# Patient Record
Sex: Female | Born: 1980 | Race: Black or African American | Hispanic: No | Smoking: Never smoker
Health system: Southern US, Community
[De-identification: ages and names within clinical notes are randomized; demographics above are authoritative.]

## PROBLEM LIST (undated history)

## (undated) ENCOUNTER — Inpatient Hospital Stay (HOSPITAL_COMMUNITY): Payer: Self-pay

## (undated) DIAGNOSIS — Z1379 Encounter for other screening for genetic and chromosomal anomalies: Principal | ICD-10-CM

## (undated) DIAGNOSIS — M5136 Other intervertebral disc degeneration, lumbar region: Secondary | ICD-10-CM

## (undated) DIAGNOSIS — Z9289 Personal history of other medical treatment: Secondary | ICD-10-CM

## (undated) DIAGNOSIS — C50412 Malignant neoplasm of upper-outer quadrant of left female breast: Principal | ICD-10-CM

## (undated) DIAGNOSIS — M51369 Other intervertebral disc degeneration, lumbar region without mention of lumbar back pain or lower extremity pain: Secondary | ICD-10-CM

## (undated) HISTORY — DX: Encounter for other screening for genetic and chromosomal anomalies: Z13.79

## (undated) HISTORY — PX: INDUCED ABORTION: SHX677

## (undated) HISTORY — DX: Malignant neoplasm of upper-outer quadrant of left female breast: C50.412

## (undated) HISTORY — PX: UMBILICAL HERNIA REPAIR: SHX196

## (undated) HISTORY — PX: DILATION AND CURETTAGE OF UTERUS: SHX78

## (undated) HISTORY — PX: HERNIA REPAIR: SHX51

---
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
## 1999-02-04 ENCOUNTER — Ambulatory Visit (HOSPITAL_COMMUNITY): Admission: RE | Admit: 1999-02-04 | Discharge: 1999-02-04 | Payer: Self-pay | Admitting: *Deleted

## 1999-02-11 HISTORY — PX: LAPAROSCOPIC CHOLECYSTECTOMY: SUR755

## 1999-03-19 ENCOUNTER — Inpatient Hospital Stay (HOSPITAL_COMMUNITY): Admission: AD | Admit: 1999-03-19 | Discharge: 1999-03-19 | Payer: Self-pay | Admitting: Obstetrics & Gynecology

## 1999-03-20 ENCOUNTER — Inpatient Hospital Stay (HOSPITAL_COMMUNITY): Admission: AD | Admit: 1999-03-20 | Discharge: 1999-03-20 | Payer: Self-pay | Admitting: Obstetrics

## 1999-04-06 ENCOUNTER — Inpatient Hospital Stay (HOSPITAL_COMMUNITY): Admission: AD | Admit: 1999-04-06 | Discharge: 1999-04-06 | Payer: Self-pay | Admitting: Obstetrics & Gynecology

## 1999-04-17 ENCOUNTER — Observation Stay (HOSPITAL_COMMUNITY): Admission: AD | Admit: 1999-04-17 | Discharge: 1999-04-17 | Payer: Self-pay | Admitting: Obstetrics

## 1999-04-19 ENCOUNTER — Inpatient Hospital Stay (HOSPITAL_COMMUNITY): Admission: AD | Admit: 1999-04-19 | Discharge: 1999-04-21 | Payer: Self-pay | Admitting: Obstetrics

## 1999-04-27 ENCOUNTER — Inpatient Hospital Stay (HOSPITAL_COMMUNITY): Admission: AD | Admit: 1999-04-27 | Discharge: 1999-04-27 | Payer: Self-pay | Admitting: Obstetrics & Gynecology

## 1999-04-30 ENCOUNTER — Inpatient Hospital Stay (HOSPITAL_COMMUNITY): Admission: AD | Admit: 1999-04-30 | Discharge: 1999-05-02 | Payer: Self-pay | Admitting: *Deleted

## 1999-05-31 ENCOUNTER — Encounter: Payer: Self-pay | Admitting: Obstetrics & Gynecology

## 1999-05-31 ENCOUNTER — Inpatient Hospital Stay (HOSPITAL_COMMUNITY): Admission: AD | Admit: 1999-05-31 | Discharge: 1999-05-31 | Payer: Self-pay | Admitting: Obstetrics & Gynecology

## 1999-06-01 ENCOUNTER — Encounter (INDEPENDENT_AMBULATORY_CARE_PROVIDER_SITE_OTHER): Payer: Self-pay

## 1999-06-01 ENCOUNTER — Inpatient Hospital Stay (HOSPITAL_COMMUNITY): Admission: RE | Admit: 1999-06-01 | Discharge: 1999-06-02 | Payer: Self-pay | Admitting: General Surgery

## 1999-06-01 ENCOUNTER — Encounter: Payer: Self-pay | Admitting: General Surgery

## 1999-08-12 ENCOUNTER — Emergency Department (HOSPITAL_COMMUNITY): Admission: EM | Admit: 1999-08-12 | Discharge: 1999-08-12 | Payer: Self-pay | Admitting: Emergency Medicine

## 2000-04-17 ENCOUNTER — Inpatient Hospital Stay (HOSPITAL_COMMUNITY): Admission: AD | Admit: 2000-04-17 | Discharge: 2000-04-17 | Payer: Self-pay | Admitting: Obstetrics

## 2000-04-26 ENCOUNTER — Inpatient Hospital Stay (HOSPITAL_COMMUNITY): Admission: AD | Admit: 2000-04-26 | Discharge: 2000-04-26 | Payer: Self-pay | Admitting: *Deleted

## 2000-05-15 ENCOUNTER — Inpatient Hospital Stay (HOSPITAL_COMMUNITY): Admission: AD | Admit: 2000-05-15 | Discharge: 2000-05-15 | Payer: Self-pay | Admitting: *Deleted

## 2000-07-13 HISTORY — PX: WISDOM TOOTH EXTRACTION: SHX21

## 2001-01-23 ENCOUNTER — Emergency Department (HOSPITAL_COMMUNITY): Admission: EM | Admit: 2001-01-23 | Discharge: 2001-01-23 | Payer: Self-pay | Admitting: Emergency Medicine

## 2001-05-01 ENCOUNTER — Emergency Department (HOSPITAL_COMMUNITY): Admission: EM | Admit: 2001-05-01 | Discharge: 2001-05-01 | Payer: Self-pay | Admitting: Nurse Practitioner

## 2001-05-21 ENCOUNTER — Inpatient Hospital Stay (HOSPITAL_COMMUNITY): Admission: AD | Admit: 2001-05-21 | Discharge: 2001-05-21 | Payer: Self-pay | Admitting: *Deleted

## 2001-05-21 ENCOUNTER — Encounter: Payer: Self-pay | Admitting: *Deleted

## 2001-06-08 ENCOUNTER — Inpatient Hospital Stay (HOSPITAL_COMMUNITY): Admission: AD | Admit: 2001-06-08 | Discharge: 2001-06-08 | Payer: Self-pay | Admitting: Obstetrics

## 2001-09-15 ENCOUNTER — Encounter: Payer: Self-pay | Admitting: Emergency Medicine

## 2001-09-15 ENCOUNTER — Emergency Department (HOSPITAL_COMMUNITY): Admission: EM | Admit: 2001-09-15 | Discharge: 2001-09-15 | Payer: Self-pay | Admitting: Emergency Medicine

## 2001-11-15 ENCOUNTER — Inpatient Hospital Stay (HOSPITAL_COMMUNITY): Admission: AD | Admit: 2001-11-15 | Discharge: 2001-11-15 | Payer: Self-pay | Admitting: *Deleted

## 2001-11-16 ENCOUNTER — Inpatient Hospital Stay (HOSPITAL_COMMUNITY): Admission: AD | Admit: 2001-11-16 | Discharge: 2001-11-16 | Payer: Self-pay | Admitting: Obstetrics and Gynecology

## 2001-11-17 ENCOUNTER — Inpatient Hospital Stay (HOSPITAL_COMMUNITY): Admission: AD | Admit: 2001-11-17 | Discharge: 2001-11-17 | Payer: Self-pay | Admitting: *Deleted

## 2001-11-24 ENCOUNTER — Inpatient Hospital Stay (HOSPITAL_COMMUNITY): Admission: AD | Admit: 2001-11-24 | Discharge: 2001-11-24 | Payer: Self-pay | Admitting: *Deleted

## 2002-01-12 ENCOUNTER — Emergency Department (HOSPITAL_COMMUNITY): Admission: EM | Admit: 2002-01-12 | Discharge: 2002-01-13 | Payer: Self-pay | Admitting: Emergency Medicine

## 2002-05-17 ENCOUNTER — Inpatient Hospital Stay (HOSPITAL_COMMUNITY): Admission: AD | Admit: 2002-05-17 | Discharge: 2002-05-17 | Payer: Self-pay | Admitting: Family Medicine

## 2002-06-04 ENCOUNTER — Inpatient Hospital Stay (HOSPITAL_COMMUNITY): Admission: AD | Admit: 2002-06-04 | Discharge: 2002-06-04 | Payer: Self-pay | Admitting: *Deleted

## 2002-06-12 ENCOUNTER — Encounter: Payer: Self-pay | Admitting: Obstetrics and Gynecology

## 2002-06-12 ENCOUNTER — Inpatient Hospital Stay (HOSPITAL_COMMUNITY): Admission: RE | Admit: 2002-06-12 | Discharge: 2002-06-12 | Payer: Self-pay | Admitting: Obstetrics and Gynecology

## 2002-06-19 ENCOUNTER — Ambulatory Visit (HOSPITAL_COMMUNITY): Admission: RE | Admit: 2002-06-19 | Discharge: 2002-06-19 | Payer: Self-pay | Admitting: Obstetrics and Gynecology

## 2002-06-19 ENCOUNTER — Encounter (INDEPENDENT_AMBULATORY_CARE_PROVIDER_SITE_OTHER): Payer: Self-pay | Admitting: Specialist

## 2002-09-23 ENCOUNTER — Emergency Department (HOSPITAL_COMMUNITY): Admission: EM | Admit: 2002-09-23 | Discharge: 2002-09-23 | Payer: Self-pay | Admitting: Emergency Medicine

## 2003-02-01 ENCOUNTER — Inpatient Hospital Stay (HOSPITAL_COMMUNITY): Admission: AD | Admit: 2003-02-01 | Discharge: 2003-02-01 | Payer: Self-pay | Admitting: Obstetrics & Gynecology

## 2003-03-22 ENCOUNTER — Inpatient Hospital Stay (HOSPITAL_COMMUNITY): Admission: AD | Admit: 2003-03-22 | Discharge: 2003-03-22 | Payer: Self-pay | Admitting: Obstetrics & Gynecology

## 2003-11-13 ENCOUNTER — Inpatient Hospital Stay (HOSPITAL_COMMUNITY): Admission: AD | Admit: 2003-11-13 | Discharge: 2003-11-13 | Payer: Self-pay | Admitting: *Deleted

## 2004-01-15 ENCOUNTER — Inpatient Hospital Stay (HOSPITAL_COMMUNITY): Admission: AD | Admit: 2004-01-15 | Discharge: 2004-01-15 | Payer: Self-pay | Admitting: *Deleted

## 2004-01-25 ENCOUNTER — Inpatient Hospital Stay (HOSPITAL_COMMUNITY): Admission: AD | Admit: 2004-01-25 | Discharge: 2004-01-25 | Payer: Self-pay | Admitting: Obstetrics & Gynecology

## 2004-02-11 ENCOUNTER — Inpatient Hospital Stay (HOSPITAL_COMMUNITY): Admission: AD | Admit: 2004-02-11 | Discharge: 2004-02-11 | Payer: Self-pay | Admitting: Family Medicine

## 2004-02-21 ENCOUNTER — Inpatient Hospital Stay (HOSPITAL_COMMUNITY): Admission: AD | Admit: 2004-02-21 | Discharge: 2004-02-21 | Payer: Self-pay | Admitting: Obstetrics & Gynecology

## 2004-02-21 ENCOUNTER — Encounter (INDEPENDENT_AMBULATORY_CARE_PROVIDER_SITE_OTHER): Payer: Self-pay | Admitting: Specialist

## 2004-07-07 ENCOUNTER — Inpatient Hospital Stay (HOSPITAL_COMMUNITY): Admission: AD | Admit: 2004-07-07 | Discharge: 2004-07-07 | Payer: Self-pay | Admitting: Obstetrics & Gynecology

## 2004-07-10 ENCOUNTER — Ambulatory Visit (HOSPITAL_COMMUNITY): Admission: RE | Admit: 2004-07-10 | Discharge: 2004-07-10 | Payer: Self-pay | Admitting: Obstetrics & Gynecology

## 2004-08-19 ENCOUNTER — Ambulatory Visit (HOSPITAL_COMMUNITY): Admission: RE | Admit: 2004-08-19 | Discharge: 2004-08-19 | Payer: Self-pay | Admitting: Obstetrics & Gynecology

## 2004-12-26 ENCOUNTER — Inpatient Hospital Stay (HOSPITAL_COMMUNITY): Admission: AD | Admit: 2004-12-26 | Discharge: 2004-12-26 | Payer: Self-pay | Admitting: Obstetrics & Gynecology

## 2005-02-01 ENCOUNTER — Inpatient Hospital Stay (HOSPITAL_COMMUNITY): Admission: AD | Admit: 2005-02-01 | Discharge: 2005-02-01 | Payer: Self-pay | Admitting: Obstetrics & Gynecology

## 2005-02-07 ENCOUNTER — Inpatient Hospital Stay (HOSPITAL_COMMUNITY): Admission: AD | Admit: 2005-02-07 | Discharge: 2005-02-09 | Payer: Self-pay | Admitting: Obstetrics & Gynecology

## 2005-12-10 ENCOUNTER — Encounter: Admission: RE | Admit: 2005-12-10 | Discharge: 2006-01-12 | Payer: Self-pay | Admitting: Obstetrics

## 2005-12-22 ENCOUNTER — Ambulatory Visit (HOSPITAL_COMMUNITY): Admission: RE | Admit: 2005-12-22 | Discharge: 2005-12-22 | Payer: Self-pay | Admitting: Obstetrics

## 2005-12-23 ENCOUNTER — Encounter (INDEPENDENT_AMBULATORY_CARE_PROVIDER_SITE_OTHER): Payer: Self-pay | Admitting: *Deleted

## 2005-12-23 ENCOUNTER — Inpatient Hospital Stay (HOSPITAL_COMMUNITY): Admission: AD | Admit: 2005-12-23 | Discharge: 2005-12-25 | Payer: Self-pay | Admitting: Obstetrics

## 2006-05-12 ENCOUNTER — Encounter: Admission: RE | Admit: 2006-05-12 | Discharge: 2006-05-12 | Payer: Self-pay | Admitting: Family Medicine

## 2006-06-22 ENCOUNTER — Emergency Department (HOSPITAL_COMMUNITY): Admission: EM | Admit: 2006-06-22 | Discharge: 2006-06-22 | Payer: Self-pay | Admitting: Emergency Medicine

## 2006-09-01 ENCOUNTER — Encounter: Admission: RE | Admit: 2006-09-01 | Discharge: 2006-09-01 | Payer: Self-pay | Admitting: Neurology

## 2006-10-05 ENCOUNTER — Encounter: Admission: RE | Admit: 2006-10-05 | Discharge: 2006-11-01 | Payer: Self-pay | Admitting: Neurology

## 2006-11-12 ENCOUNTER — Inpatient Hospital Stay (HOSPITAL_COMMUNITY): Admission: AD | Admit: 2006-11-12 | Discharge: 2006-11-12 | Payer: Self-pay | Admitting: Obstetrics

## 2006-11-15 ENCOUNTER — Encounter (INDEPENDENT_AMBULATORY_CARE_PROVIDER_SITE_OTHER): Payer: Self-pay | Admitting: Specialist

## 2006-11-15 ENCOUNTER — Ambulatory Visit (HOSPITAL_COMMUNITY): Admission: AD | Admit: 2006-11-15 | Discharge: 2006-11-15 | Payer: Self-pay | Admitting: Obstetrics

## 2007-01-25 ENCOUNTER — Ambulatory Visit: Payer: Self-pay | Admitting: Family Medicine

## 2008-12-13 ENCOUNTER — Inpatient Hospital Stay (HOSPITAL_COMMUNITY): Admission: AD | Admit: 2008-12-13 | Discharge: 2008-12-13 | Payer: Self-pay | Admitting: Obstetrics & Gynecology

## 2010-01-23 ENCOUNTER — Inpatient Hospital Stay (HOSPITAL_COMMUNITY): Admission: AD | Admit: 2010-01-23 | Discharge: 2010-01-26 | Payer: Self-pay | Admitting: Obstetrics and Gynecology

## 2010-04-26 ENCOUNTER — Emergency Department (HOSPITAL_COMMUNITY): Admission: EM | Admit: 2010-04-26 | Discharge: 2010-04-26 | Payer: Self-pay | Admitting: Emergency Medicine

## 2010-08-02 ENCOUNTER — Encounter: Payer: Self-pay | Admitting: Family Medicine

## 2010-09-01 ENCOUNTER — Inpatient Hospital Stay (INDEPENDENT_AMBULATORY_CARE_PROVIDER_SITE_OTHER)
Admission: RE | Admit: 2010-09-01 | Discharge: 2010-09-01 | Disposition: A | Discharge status: Home / Self Care | Payer: Self-pay | Source: Ambulatory Visit | Attending: Emergency Medicine | Admitting: Emergency Medicine

## 2010-09-01 DIAGNOSIS — IMO0002 Reserved for concepts with insufficient information to code with codable children: Secondary | ICD-10-CM

## 2010-09-01 LAB — POCT URINALYSIS DIPSTICK
Bilirubin Urine: NEGATIVE
Hgb urine dipstick: NEGATIVE
Ketones, ur: NEGATIVE mg/dL
Nitrite: NEGATIVE
Protein, ur: NEGATIVE mg/dL
Specific Gravity, Urine: 1.02 (ref 1.005–1.030)
Urine Glucose, Fasting: NEGATIVE mg/dL
pH: 7 (ref 5.0–8.0)

## 2010-09-01 LAB — POCT PREGNANCY, URINE: Preg Test, Ur: NEGATIVE

## 2010-09-24 LAB — COMPREHENSIVE METABOLIC PANEL
ALT: 55 U/L — ABNORMAL HIGH (ref 0–35)
AST: 87 U/L — ABNORMAL HIGH (ref 0–37)
Albumin: 4 g/dL (ref 3.5–5.2)
Alkaline Phosphatase: 101 U/L (ref 39–117)
BUN: 10 mg/dL (ref 6–23)
CO2: 21 mEq/L (ref 19–32)
Calcium: 9.2 mg/dL (ref 8.4–10.5)
Chloride: 110 mEq/L (ref 96–112)
Creatinine, Ser: 1.05 mg/dL (ref 0.4–1.2)
GFR calc Af Amer: >60 mL/min (ref >60)
GFR calc non Af Amer: >60 mL/min (ref >60)
Glucose, Bld: 102 mg/dL — ABNORMAL HIGH (ref 70–99)
Potassium: 3.9 mEq/L (ref 3.5–5.1)
Sodium: 139 mEq/L (ref 135–145)
Total Bilirubin: 0.9 mg/dL (ref 0.3–1.2)
Total Protein: 7.7 g/dL (ref 6.0–8.3)

## 2010-09-24 LAB — URINALYSIS, ROUTINE W REFLEX MICROSCOPIC
Glucose, UA: NEGATIVE mg/dL
Ketones, ur: 15 mg/dL — AB
Leukocytes, UA: NEGATIVE
Nitrite: NEGATIVE
Protein, ur: >300 mg/dL — AB
Specific Gravity, Urine: 1.02 (ref 1.005–1.030)
Urobilinogen, UA: 2 mg/dL — ABNORMAL HIGH (ref 0.0–1.0)
pH: 7.5 (ref 5.0–8.0)

## 2010-09-24 LAB — CK: Total CK: 222 U/L — ABNORMAL HIGH (ref 7–177)

## 2010-09-24 LAB — CBC
HCT: 37.1 % (ref 36.0–46.0)
Hemoglobin: 12.3 g/dL (ref 12.0–15.0)
MCH: 29.7 pg (ref 26.0–34.0)
MCHC: 33.2 g/dL (ref 30.0–36.0)
MCV: 89.6 fL (ref 78.0–100.0)
Platelets: 186 10*3/uL (ref 150–400)
RBC: 4.14 MIL/uL (ref 3.87–5.11)
RDW: 13.5 % (ref 11.5–15.5)
WBC: 3.8 10*3/uL — ABNORMAL LOW (ref 4.0–10.5)

## 2010-09-24 LAB — POCT PREGNANCY, URINE: Preg Test, Ur: NEGATIVE

## 2010-09-24 LAB — DIFFERENTIAL
Basophils Absolute: 0 10*3/uL (ref 0.0–0.1)
Basophils Relative: 0 % (ref 0–1)
Eosinophils Absolute: 0 10*3/uL (ref 0.0–0.7)
Eosinophils Relative: 1 % (ref 0–5)
Lymphocytes Relative: 29 % (ref 12–46)
Lymphs Abs: 1.1 10*3/uL (ref 0.7–4.0)
Monocytes Absolute: 0.2 10*3/uL (ref 0.1–1.0)
Monocytes Relative: 6 % (ref 3–12)
Neutro Abs: 2.5 10*3/uL (ref 1.7–7.7)
Neutrophils Relative %: 65 % (ref 43–77)

## 2010-09-24 LAB — RAPID STREP SCREEN (MED CTR MEBANE ONLY): Streptococcus, Group A Screen (Direct): NEGATIVE

## 2010-09-24 LAB — URINE MICROSCOPIC-ADD ON

## 2010-09-27 LAB — CBC
HCT: 29.7 % — ABNORMAL LOW (ref 36.0–46.0)
Hemoglobin: 10.1 g/dL — ABNORMAL LOW (ref 12.0–15.0)
MCH: 32.4 pg (ref 26.0–34.0)
MCHC: 34.2 g/dL (ref 30.0–36.0)
MCV: 94.7 fL (ref 78.0–100.0)
Platelets: 171 10*3/uL (ref 150–400)
RBC: 3.14 MIL/uL — ABNORMAL LOW (ref 3.87–5.11)
RDW: 14 % (ref 11.5–15.5)
WBC: 9.3 10*3/uL (ref 4.0–10.5)

## 2010-09-28 LAB — CBC
HCT: 33 % — ABNORMAL LOW (ref 36.0–46.0)
Hemoglobin: 11.1 g/dL — ABNORMAL LOW (ref 12.0–15.0)
MCH: 32.1 pg (ref 26.0–34.0)
MCHC: 33.7 g/dL (ref 30.0–36.0)
MCV: 95.1 fL (ref 78.0–100.0)
Platelets: 176 10*3/uL (ref 150–400)
RBC: 3.47 MIL/uL — ABNORMAL LOW (ref 3.87–5.11)
RDW: 13.8 % (ref 11.5–15.5)
WBC: 7.3 10*3/uL (ref 4.0–10.5)

## 2010-10-20 LAB — WET PREP, GENITAL
Trich, Wet Prep: NONE SEEN
Yeast Wet Prep HPF POC: NONE SEEN

## 2010-10-20 LAB — GC/CHLAMYDIA PROBE AMP, GENITAL
Chlamydia, DNA Probe: NEGATIVE
GC Probe Amp, Genital: NEGATIVE

## 2010-11-05 ENCOUNTER — Ambulatory Visit: Payer: Medicaid Other | Admitting: Physical Therapy

## 2010-11-13 ENCOUNTER — Ambulatory Visit: Payer: Medicaid Other | Admitting: Physical Therapy

## 2010-11-28 NOTE — Op Note (Signed)
   NAME:  Dawn Haynes, Dawn Haynes                           ACCOUNT NO.:  [id number]   MEDICAL RECORD NO.:  [id number]                   PATIENT TYPE:  AMB   LOCATION:  SDC                                  FACILITY:  [facility]   PHYSICIAN:  Phil D. Okey Dupre, M.D.                  DATE OF BIRTH:  1981/06/11   DATE OF PROCEDURE:  06/19/2002  DATE OF DISCHARGE:                                 OPERATIVE REPORT   PROCEDURE:  Dilatation and evacuation.   PREOPERATIVE DIAGNOSES:  Missed abortion.   POSTOPERATIVE DIAGNOSES:  Missed abortion.   SURGEON:  Javier Glazier. Okey Dupre, M.D.   PROCEDURE:  Under satisfactory IV sedation with the patient in the dorsal  lithotomy position, the perineum and vagina prepped and draped in the usual  sterile manner.  Bimanual pelvic examination revealed a uterus that was in  retroversion first degree, freely moveable, about [redacted] weeks gestational size,  soft with normal adnexa.  A weighted speculum was placed in the posterior  fourchette of the vagina through a marital introitus.  BUS was within normal  limits.  Vagina was clean and well rugated.  Anterior lip of a parous cervix  was grasped with a single tooth tenaculum and the uterine cavity sounded to  a depth of 13 cm.  Xylocaine 1% 10 cc was injected at 4 o'clock and another  10 cc at 8 o'clock in the paracervical area for paracervical block without  incident.  The cervical os was then dilated up to a 10 Hegar dilator and the  number 10 curved suction curette was used to evacuate the uterine contents  without incident.  There was a small bleeder where the tenaculum from the  anterior lip of the cervix was removed and a ring forceps was placed there  ________ for five minutes to control the bleeding after the speculum was  removed and then the patient transferred to recovery room in satisfactory  condition after tolerating the procedure well with minimal blood loss.                                               Phil D. Okey Dupre,  M.D.    PDR/MEDQ  D:  06/19/2002  T:  06/19/2002  Job:  [id number]

## 2010-11-28 NOTE — Discharge Summary (Signed)
NAMESHATINA, STREETS                 ACCOUNT NO.:  [id number]   MEDICAL RECORD NO.:  [id number]          PATIENT TYPE:  INP   LOCATION:  9312                          FACILITY:  [facility]   PHYSICIAN:  Kathreen Cosier, M.D.DATE OF BIRTH:  1980-09-24   DATE OF ADMISSION:  12/23/2005  DATE OF DISCHARGE:  12/25/2005                                 DISCHARGE SUMMARY   HOSPITAL COURSE:  The patient is a 30 year old gravida 2, para 1, EDC December 22, 2005.  The patient states she had sex and saw a gush of fluid from the  vagina.  On being seen at the hospital, she had an AFI of 1.7 and she was  leaking bloody fluid.  Temperature prior to admission was 101.6.  She was 15  weeks' pregnant with premature rupture of membranes.  She was admitted and  Cytotec was placed.  On December 23, 2005, at 12:05 a.m., a nonviable fetus was  removed from the vagina and then a sponge forceps used to remove the  placenta intact at 2:50 __________.  The patient was continued on IV  ampicillin and gentamicin and was discharged home on December 25, 2005.   DISCHARGE MEDICATIONS:  1.  Ampicillin 500 mg p.o. q.6 h.  2.  Motrin for pain.   FOLLOWUP:  To see me in 4 weeks.   DISCHARGE DIAGNOSES:  1.  Status post premature rupture of membranes at 15 weeks.  2.  Endometritis.           ______________________________  Kathreen Cosier, M.D.     BAM/MEDQ  D:  02/03/2006  T:  02/03/2006  Job:  [id number]

## 2010-11-28 NOTE — Discharge Summary (Signed)
[facility]  Patient:    NICKISHA HUM                         MRN: [id number] Adm. Date:  [id number] Disc. Date: [id number] Attending:  Annamarie Dawley                           Discharge Summary  ADMISSION DIAGNOSES:          1. [redacted] weeks gestation.                               2. Uterine contractions.                               3. Rule out labor.  DISCHARGE DIAGNOSES:          1. [redacted] weeks gestation.                               2. Uterine contractions.                               3. Rule out labor.                               4. Not in labor.  CONDITION ON DISCHARGE:       Discharged home in good condition, undelivered with labor instructions.  REASON FOR ADMISSION:         A 30 year old, gravida 3, para 2-0-0-2, black female at [redacted] weeks gestation presents to [facility] [place] after having uterine contractions all day.  She also complained of diarrhea with eight stools during the day.  She was hospitalized the previous week to rule out labor and was found not to be in labor and was discharged home undelivered. The patient denies fever, chills, dysuria, vaginal bleeding.  PAST MEDICAL HISTORY:         Surgery; None.  Illnesses; None.  MEDICATIONS:                  None.  ALLERGIES:                    No known drug allergies.  SOCIAL HISTORY:               Single.  Denied tobacco, alcohol, or recreational  drug use.  PAST OBSTETRIC HISTORY:       In 1998 normal spontaneous vaginal delivery uncomplicated at 40 weeks.  History of preterm labor with this pregnancy.  In 1999 normal spontaneous vaginal delivery without complications at [redacted] weeks gestation.  GYN HISTORY:                  Positive history of Chlamydia.  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  PHYSICAL EXAMINATION:         GENERAL: A well-developed, well-nourished black female in no acute distress.   VITAL SIGNS: Temperature 98, pulse 103, blood pressure 116/72.  Fetal heart tones of 130 to 140 beats per minute.  HEENT: Benign. LUNGS: Clear to auscultation bilaterally.  HEART: Revealed  a regular rate and rhythm without murmurs, rubs, or gallops.  PELVIC: Cervix was 4 cm dilated, 70%  effaced, vertex at -2 station.  ASSESSMENT:                   [redacted] weeks gestation, uterine contractions, admit for rule out labor.  HOSPITAL COURSE:              The patient was admitted and over the course of two days did not make any significant progress with cervical dilation and was therefore discharged home in good condition, undelivered.  Fetal heart rate tracing had been reactive throughout.  The patient was reporting good fetal movement and was discharged with labor instructions.  DISCHARGE MEDICATIONS:        1. Continue prenatal vitamins.   DISPOSITION:                  Rest as much as possible.  Return to maternity admissions unit when contractions are stronger and at least three to five minutes apart without spacing out. DD:  09/23/99 TD:  09/24/99 Job: [id number]

## 2010-11-28 NOTE — Op Note (Signed)
NAMECHRIS, NARASIMHAN                 ACCOUNT NO.:  [id number]   MEDICAL RECORD NO.:  [id number]          PATIENT TYPE:  AMB   LOCATION:  910B                          [facility]ACILITY:  [facility]   PHYSICIAN:  Kathreen Cosier, M.D.DATE OF BIRTH:  1981-03-17   DATE OF PROCEDURE:  11/15/2006  DATE OF DISCHARGE:  11/15/2006                               OPERATIVE REPORT   PREOPERATIVE DIAGNOSIS:  Spontaneous incomplete abortion.   PROCEDURE:  Using MAC, patient in the lithotomy position, perineum and  vagina prepped and draped, bladder emptied with a straight catheter.  Bimanual exam revealed uterus to be 8-weeks' size and retroverted. The  cervical os was noted to be open and easily admitted a #9 suction.  Cavity was suctioned until clean. The patient tolerated the procedure  well. Taken to the recovery room in good condition.           ______________________________  Kathreen Cosier, M.D.     BAM/MEDQ  D:  11/15/2006  T:  11/16/2006  Job:  [id number]

## 2010-11-28 NOTE — Discharge Summary (Signed)
[facility]  Patient:    Dawn Haynes                         MRN: [id number] Adm. Date:  [id number] Disc. Date: [id number] Attending:  Annamarie Dawley                           Discharge Summary  NO DICTATION. DD:  09/23/99 TD:  09/24/99 Job: [id number]

## 2012-09-08 ENCOUNTER — Emergency Department (HOSPITAL_COMMUNITY)
Admission: EM | Admit: 2012-09-08 | Discharge: 2012-09-08 | Disposition: A | Discharge status: Home / Self Care | Payer: Medicaid Other | Attending: Emergency Medicine | Admitting: Emergency Medicine

## 2012-09-08 ENCOUNTER — Encounter (HOSPITAL_COMMUNITY): Payer: Self-pay | Admitting: Neurology

## 2012-09-08 DIAGNOSIS — R112 Nausea with vomiting, unspecified: Secondary | ICD-10-CM

## 2012-09-08 DIAGNOSIS — O210 Mild hyperemesis gravidarum: Secondary | ICD-10-CM | POA: Insufficient documentation

## 2012-09-08 DIAGNOSIS — R110 Nausea: Secondary | ICD-10-CM | POA: Insufficient documentation

## 2012-09-08 DIAGNOSIS — R197 Diarrhea, unspecified: Secondary | ICD-10-CM | POA: Insufficient documentation

## 2012-09-08 DIAGNOSIS — Z8739 Personal history of other diseases of the musculoskeletal system and connective tissue: Secondary | ICD-10-CM | POA: Insufficient documentation

## 2012-09-08 DIAGNOSIS — O9989 Other specified diseases and conditions complicating pregnancy, childbirth and the puerperium: Secondary | ICD-10-CM | POA: Insufficient documentation

## 2012-09-08 HISTORY — DX: Other intervertebral disc degeneration, lumbar region without mention of lumbar back pain or lower extremity pain: M51.369

## 2012-09-08 HISTORY — DX: Other intervertebral disc degeneration, lumbar region: M51.36

## 2012-09-08 MED ORDER — SODIUM CHLORIDE 0.9 % IV BOLUS (SEPSIS): 1000.0000 mL | Freq: Once | INTRAVENOUS | Status: DC

## 2012-09-08 MED ORDER — ONDANSETRON HCL 4 MG/2ML IJ SOLN
4.0000 mg | Freq: Once | INTRAMUSCULAR | Status: AC
  Administered 2012-09-08: 4 mg via INTRAVENOUS
  Filled 2012-09-08: qty 2

## 2012-09-08 MED ORDER — ONDANSETRON 4 MG PO TBDP: 4.0000 mg | ORAL_TABLET | Freq: Three times a day (TID) | ORAL | Status: DC | PRN

## 2012-09-08 NOTE — ED Notes (Signed)
Pt reporting v/d since yesterday. [redacted] weeks pregnant. No prenatal care at this time. Pt a x 4

## 2012-09-08 NOTE — ED Provider Notes (Signed)
History     CSN: [id number]  Arrival date & time 09/08/12  1048   First MD Initiated Contact with Patient 09/08/12 1049      Chief Complaint  Patient presents with  . Emesis  . Diarrhea    (Consider location/radiation/quality/duration/timing/severity/associated sxs/prior treatment) HPI Comments: The pt is [redacted] weeks pregnant by dates who states that she has had some morning sickness with n/v for the last 3 weeks but last night she developed acute onset of watery diarrhea and has had multiple episodes of the same overnight associated with mild abd cramping and increased n/v.  She has had small am't of chicken broth this AM but sx are persistent, nothing makes better or worse and no assocaited fevers, cough, sob, back pain, dysuria, vag bleeding or d/c.  She has had multiple children in the past and has an OBGYN, Dr. Huel Cote but has not seen them yet b/c of no insurance which she is currently working on.  No meds pta.  Patient is a 32 y.o. female presenting with vomiting and diarrhea. The history is provided by the patient.  Emesis Associated symptoms: diarrhea   Diarrhea Associated symptoms: vomiting     Past Medical History  Diagnosis Date  . Spina bifida   . DDD (degenerative disc disease), lumbar     Past Surgical History  Procedure Laterality Date  . Wisdom tooth extraction    . Induced abortion    . Dilation and curettage of uterus      No family history on file.  History  Substance Use Topics  . Smoking status: Never Smoker   . Smokeless tobacco: Not on file  . Alcohol Use: No    OB History   Grav Para Term Preterm Abortions TAB SAB Ect Mult Living   1               Review of Systems  Gastrointestinal: Positive for vomiting and diarrhea.  All other systems reviewed and are negative.    Allergies  Review of patient's allergies indicates no known allergies.  Home Medications   Current Outpatient Rx  Name  Route  Sig  Dispense  Refill  .  Prenatal Vit-Fe Fumarate-FA (PRENATAL MULTIVITAMIN) TABS   Oral   Take 1 tablet by mouth daily.         . ondansetron (ZOFRAN ODT) 4 MG disintegrating tablet   Oral   Take 1 tablet (4 mg total) by mouth every 8 (eight) hours as needed for nausea.   10 tablet   0     BP 107/73  Pulse 71  Temp(Src) 98.3 F (36.8 C) (Oral)  Resp 16  Ht 5\' 2"  (1.575 m)  Wt 150 lb (68.04 kg)  BMI 27.43 kg/m2  SpO2 100%  Physical Exam  Nursing note and vitals reviewed. Constitutional: She appears well-developed and well-nourished. No distress.  HENT:  Head: Normocephalic and atraumatic.  Mouth/Throat: Oropharynx is clear and moist. No oropharyngeal exudate.  MMM  Eyes: Conjunctivae and EOM are normal. Pupils are equal, round, and reactive to light. Right eye exhibits no discharge. Left eye exhibits no discharge. No scleral icterus.  Neck: Normal range of motion. Neck supple. No JVD present. No thyromegaly present.  Cardiovascular: Normal rate, regular rhythm, normal heart sounds and intact distal pulses.  Exam reveals no gallop and no friction rub.   No murmur heard. Pulmonary/Chest: Effort normal and breath sounds normal. No respiratory distress. She has no wheezes. She has no rales.  Abdominal: Soft.  She exhibits no distension and no mass. Bowel sounds are increased. There is no hepatosplenomegaly. There is no tenderness.  Musculoskeletal: Normal range of motion. She exhibits no edema and no tenderness.  Lymphadenopathy:    She has no cervical adenopathy.  Neurological: She is alert. Coordination normal.  Skin: Skin is warm and dry. No rash noted. No erythema.  Psychiatric: She has a normal mood and affect. Her behavior is normal.    ED Course  Procedures (including critical care time)  Labs Reviewed - No data to display No results found.   1. Nausea vomiting and diarrhea       MDM  Pt has had acute onset of gastroenteritis type sx, she has not had diarrhea until this week.  She  has a soft and non tender abd with increased BS and on my bedside US, there is a single IUP with identifiable HR, very early pregnancy.  VS are normal without tachycardia, fever or hypotension.  She will be given 2L of IVF, zofran,  Filed Vitals:   09/08/12 1330  BP: 107/73  Pulse: 71  Temp:   Resp:     No further vomiting, zofran with good improvement, fluids given, home with f/u with OBGYN and PMD.       Vida Roller, MD 09/08/12 1346

## 2012-10-07 LAB — OB RESULTS CONSOLE HEPATITIS B SURFACE ANTIGEN: Hepatitis B Surface Ag: NEGATIVE

## 2012-10-07 LAB — OB RESULTS CONSOLE GC/CHLAMYDIA: Chlamydia: NEGATIVE

## 2012-10-07 LAB — OB RESULTS CONSOLE RUBELLA ANTIBODY, IGM: Rubella: NON-IMMUNE/NOT IMMUNE

## 2013-01-16 ENCOUNTER — Inpatient Hospital Stay (HOSPITAL_COMMUNITY): Payer: Medicaid Other

## 2013-01-16 ENCOUNTER — Inpatient Hospital Stay (HOSPITAL_COMMUNITY)
Admission: AD | Admit: 2013-01-16 | Discharge: 2013-01-27 | DRG: 765 | Disposition: A | Discharge status: Home / Self Care | Payer: Medicaid Other | Source: Ambulatory Visit | Attending: Obstetrics and Gynecology | Admitting: Obstetrics and Gynecology

## 2013-01-16 ENCOUNTER — Encounter (HOSPITAL_COMMUNITY): Payer: Self-pay | Admitting: *Deleted

## 2013-01-16 DIAGNOSIS — O8612 Endometritis following delivery: Secondary | ICD-10-CM | POA: Diagnosis not present

## 2013-01-16 DIAGNOSIS — Z98891 History of uterine scar from previous surgery: Secondary | ICD-10-CM

## 2013-01-16 DIAGNOSIS — O42919 Preterm premature rupture of membranes, unspecified as to length of time between rupture and onset of labor, unspecified trimester: Secondary | ICD-10-CM | POA: Diagnosis present

## 2013-01-16 DIAGNOSIS — O4410 Placenta previa with hemorrhage, unspecified trimester: Secondary | ICD-10-CM | POA: Diagnosis present

## 2013-01-16 DIAGNOSIS — O4290 Premature rupture of membranes, unspecified as to length of time between rupture and onset of labor, unspecified weeks of gestation: Principal | ICD-10-CM | POA: Diagnosis present

## 2013-01-16 DIAGNOSIS — O322XX0 Maternal care for transverse and oblique lie, not applicable or unspecified: Secondary | ICD-10-CM | POA: Diagnosis present

## 2013-01-16 LAB — CBC
Hemoglobin: 10.5 g/dL — ABNORMAL LOW (ref 12.0–15.0)
MCH: 31.5 pg (ref 26.0–34.0)
MCHC: 33.3 g/dL (ref 30.0–36.0)
RDW: 13.9 % (ref 11.5–15.5)

## 2013-01-16 MED ORDER — AMOXICILLIN 500 MG PO CAPS
500.0000 mg | ORAL_CAPSULE | Freq: Three times a day (TID) | ORAL | Status: DC
  Administered 2013-01-19 – 2013-01-22 (×12): 500 mg via ORAL
  Filled 2013-01-16 (×14): qty 1

## 2013-01-16 MED ORDER — BETAMETHASONE SOD PHOS & ACET 6 (3-3) MG/ML IJ SUSP
12.0000 mg | INTRAMUSCULAR | Status: AC
  Administered 2013-01-16 – 2013-01-17 (×2): 12 mg via INTRAMUSCULAR
  Filled 2013-01-16 (×2): qty 2

## 2013-01-16 MED ORDER — ACETAMINOPHEN 325 MG PO TABS: 650.0000 mg | ORAL_TABLET | ORAL | Status: DC | PRN

## 2013-01-16 MED ORDER — SODIUM CHLORIDE 0.9 % IV SOLN
2.0000 g | Freq: Four times a day (QID) | INTRAVENOUS | Status: AC
  Administered 2013-01-16 – 2013-01-18 (×8): 2 g via INTRAVENOUS
  Filled 2013-01-16 (×8): qty 2000

## 2013-01-16 MED ORDER — DOCUSATE SODIUM 100 MG PO CAPS
100.0000 mg | ORAL_CAPSULE | Freq: Every day | ORAL | Status: DC
  Administered 2013-01-17 – 2013-01-22 (×6): 100 mg via ORAL
  Filled 2013-01-16 (×7): qty 1

## 2013-01-16 MED ORDER — PRENATAL MULTIVITAMIN CH
1.0000 | ORAL_TABLET | Freq: Every day | ORAL | Status: DC
  Administered 2013-01-17 – 2013-01-22 (×6): 1 via ORAL
  Filled 2013-01-16 (×7): qty 1

## 2013-01-16 MED ORDER — ZOLPIDEM TARTRATE 5 MG PO TABS: 5.0000 mg | ORAL_TABLET | Freq: Every evening | ORAL | Status: DC | PRN

## 2013-01-16 MED ORDER — AZITHROMYCIN 500 MG PO TABS
500.0000 mg | ORAL_TABLET | Freq: Every day | ORAL | Status: AC
  Administered 2013-01-16 – 2013-01-22 (×7): 500 mg via ORAL
  Filled 2013-01-16 (×7): qty 1

## 2013-01-16 MED ORDER — CALCIUM CARBONATE ANTACID 500 MG PO CHEW
2.0000 | CHEWABLE_TABLET | ORAL | Status: DC | PRN
  Filled 2013-01-16: qty 2

## 2013-01-16 MED ORDER — LACTATED RINGERS IV SOLN
INTRAVENOUS | Status: DC
  Administered 2013-01-16 – 2013-01-17 (×2): via INTRAVENOUS

## 2013-01-16 NOTE — H&P (Signed)
Dawn Haynes is a 32 y.o. female, G25 P40, EGA [redacted]W[redacted]D with EDC 10-4 presenting for c/o leaking fluid since about 1900.  On eval in MAU, grossly ruptured.  Last delivery at 36 weeks, she was offered and declined weekly 17-P with this pregnancy which has been uncomplicated up until now.  See prenatal records for complete history. Maternal Medical History:  Reason for admission: Rupture of membranes.   Contractions: Frequency: rare.    Fetal activity: Perceived fetal activity is normal.    Prenatal complications: no prenatal complications   OB History   Grav Para Term Preterm Abortions TAB SAB Ect Mult Living   14 6 5 1 7 1 6   5     SVD at term x 4, SVD at 28 weeks with fetal demise and anencephaly, last SVD at 36 weeks  Past Medical History  Diagnosis Date  . Spina bifida   . Arthritis     Degenerative disc lumbar  . Blood transfusion without reported diagnosis 2009  . Chlamydia    Past Surgical History  Procedure Laterality Date  . Wisdom tooth extraction    . Induced abortion    . Dilation and curettage of uterus     Family History: family history includes Diabetes in her maternal grandmother. Social History:  reports that she has never smoked. She does not have any smokeless tobacco history on file. She reports that she does not drink alcohol or use illicit drugs.   Prenatal Transfer Tool  Maternal Diabetes: No Genetic Screening: Normal Maternal Ultrasounds/Referrals: Normal Fetal Ultrasounds or other Referrals:  None Maternal Substance Abuse:  No Significant Maternal Medications:  None Significant Maternal Lab Results:  None Other Comments:  None  Review of Systems  Respiratory: Negative.   Cardiovascular: Negative.       Blood pressure 113/64, pulse 92, temperature 98.7 F (37.1 C), temperature source Oral, resp. rate 18, height 5\' 2"  (1.575 m), weight 86.24 kg (190 lb 2 oz), SpO2 100.00%. Maternal Exam:  Uterine Assessment: Contraction frequency is rare.    Abdomen: Patient reports no abdominal tenderness. Introitus: Normal vulva. Normal vagina.  Ferning test: positive.  Amniotic fluid character: clear.  Pelvis: adequate for delivery.      Fetal Exam Fetal Monitor Review: Mode: ultrasound.   Baseline rate: 150.  Variability: moderate (6-25 bpm).    Fetal State Assessment: Category I - tracings are normal.     Physical Exam  Constitutional: She appears well-developed and well-nourished.  Cardiovascular: Normal rate, regular rhythm and normal heart sounds.   No murmur heard. Respiratory: Effort normal and breath sounds normal. No respiratory distress. She has no wheezes.  GI: Soft.  gravid    Prenatal labs: ABO, Rh:  A pos Antibody:  neg Rubella:  Imm RPR:   NR HBsAg:   Neg HIV:   NR GBS:     Assessment/Plan: IUP at [redacted]W[redacted]D with PPROM.  Will admit for antibiotics and steroids and monitor for signs of labor or infection.  NICU approves admission for now, will have to readdress if she goes into labor.  Will get u/s for presentation and EFW, NICU consult.   Barby Colvard D 01/16/2013, 8:55 PM

## 2013-01-16 NOTE — Plan of Care (Signed)
Problem: Consults Goal: Birthing Suites Patient Information Press F2 to bring up selections list   Pt < [redacted] weeks EGA     

## 2013-01-16 NOTE — MAU Provider Note (Signed)
  History     CSN: [id number]  Arrival date and time: 01/16/13 9629   None     Chief Complaint  Patient presents with  . Rupture of Membranes   HPI 32 y.o. [id number] at 27.[redacted] weeks EGA with SROM at 1900 tonight. Grossly ruptured upon arrival. No pain, spotting with wiping upon arrival. Uncomplicated prenatal course this pregnancy. H/O one preterm delivery of anencephalic baby, all other deliveries at term.   Past Medical History  Diagnosis Date  . Spina bifida   . DDD (degenerative disc disease), lumbar     Past Surgical History  Procedure Laterality Date  . Wisdom tooth extraction    . Induced abortion    . Dilation and curettage of uterus      No family history on file.  History  Substance Use Topics  . Smoking status: Never Smoker   . Smokeless tobacco: Not on file  . Alcohol Use: No    Allergies: No Known Allergies  Prescriptions prior to admission  Medication Sig Dispense Refill  . Prenatal Vit-Fe Fumarate-FA (PRENATAL MULTIVITAMIN) TABS Take 1 tablet by mouth daily.      . ondansetron (ZOFRAN ODT) 4 MG disintegrating tablet Take 1 tablet (4 mg total) by mouth every 8 (eight) hours as needed for nausea.  10 tablet  0    Review of Systems  Constitutional: Negative.   Respiratory: Negative.   Cardiovascular: Negative.   Gastrointestinal: Negative for nausea, vomiting, abdominal pain, diarrhea and constipation.  Genitourinary: Negative for dysuria, urgency, frequency, hematuria and flank pain.       Negative cramping/contractions  Musculoskeletal: Negative.   Neurological: Negative.   Psychiatric/Behavioral: Negative.    Physical Exam   There were no vitals taken for this visit.  Physical Exam  Nursing note and vitals reviewed. Constitutional: She is oriented to person, place, and time. She appears well-developed and well-nourished. No distress.  Cardiovascular: Normal rate.   Respiratory: Effort normal.  GI: Soft. There is no tenderness.   Genitourinary: Vaginal discharge (clear fluid, grossly ruputured) found.  + fern   Musculoskeletal: Normal range of motion.  Neurological: She is alert and oriented to person, place, and time.  Skin: Skin is warm.  Psychiatric: She has a normal mood and affect.    MAU Course  Procedures EFM reactive at 27 weeks, TOCO quiet  Assessment and Plan  32 y.o. [id number] at 27.[redacted] weeks EGA with PPROM Admit to ante - Dr. Jackelyn Knife to put in orders  [facility] 01/16/2013, 8:04 PM

## 2013-01-16 NOTE — MAU Note (Signed)
Water broke 715 pm. No cramping but noticed some bleeding.

## 2013-01-17 DIAGNOSIS — O42919 Preterm premature rupture of membranes, unspecified as to length of time between rupture and onset of labor, unspecified trimester: Secondary | ICD-10-CM | POA: Diagnosis present

## 2013-01-17 LAB — TYPE AND SCREEN
ABO/RH(D): A POS
Antibody Screen: NEGATIVE

## 2013-01-17 MED ORDER — SODIUM CHLORIDE 0.9 % IJ SOLN
3.0000 mL | Freq: Two times a day (BID) | INTRAMUSCULAR | Status: DC
  Administered 2013-01-17 – 2013-01-20 (×6): 3 mL via INTRAVENOUS

## 2013-01-17 NOTE — Progress Notes (Signed)
Patient ID: Dawn Haynes, female   DOB: May 07, 1981, 32 y.o.   MRN: [id number] HD #3 PPROM/trnasverse lie/low-lying placenta  No contractions, light LOF  somoe constipation VSS FHR reassuring on NST's Received second betamethasone last PM Continue antibiotics

## 2013-01-17 NOTE — Progress Notes (Addendum)
HD #2, [redacted]W[redacted]D, PPROM Feels ok, no ctx Afeb, VSS FHT- ok U/s- oligo, transverse, low-lying placenta, EFW 2lbs 12oz Discussed situation, continue abx, second dose BMZ later today, expectant management.  Discussed probable need for c-section for delivery and that NICU may be full and she may need to be transferred

## 2013-01-17 NOTE — Progress Notes (Signed)
Patient ID: Dawn Haynes, female   DOB: 1980-09-09, 32 y.o.   MRN: [id number] Pt reports good day, light LOF but no contractions +FM and EFM reassuring  On ampicillin and azithromycin Second betamethasone tonight  Delivery for now would be c-section given transverse lie and low lying placenta

## 2013-01-18 MED ORDER — LACTATED RINGERS IV SOLN
INTRAVENOUS | Status: DC
  Administered 2013-01-18: 125 mL/h via INTRAVENOUS

## 2013-01-18 MED ORDER — MAGNESIUM HYDROXIDE 400 MG/5ML PO SUSP: 30.0000 mL | Freq: Every day | ORAL | Status: DC | PRN

## 2013-01-19 LAB — CULTURE, BETA STREP (GROUP B ONLY)

## 2013-01-19 NOTE — Progress Notes (Signed)
Dawn Haynes was in good spirits and appears to be coping with her situation well.  She had several questions about the experience of the NICU.  I was able to tell her about the support that will be available to her in the NICU and answer some of her questions about bonding with her baby, [name].  I believe she would benefit from being able to visit the NICU so that she can begin to prepare herself emotionally.  I recommended that she go at a time when her husband or another support person can accompany her, especially since she has had a 28 week demise (though she did not bring up this experience in her conversation with me.)  She reported having 6 children, which was information at odds with her chart.  The two who are living with her and her husband are ages 27 and 52, the older children live with their father.    She also requested information on caring for a premature baby so that she can begin to prepare herself.  I mentioned this to her nurse.    We will continue to follow up with her, but please also page as needs arise.    [address] [place] Pager, [phone] 12:16 PM   01/19/13 1200  Clinical Encounter Type  Visited With Patient  Visit Type Spiritual support

## 2013-01-19 NOTE — Progress Notes (Signed)
Patient ID: Dawn Haynes, female   DOB: 01-Jun-1981, 32 y.o.   MRN: [id number] HD # 4 PPROM/transverse lie and low-lying placenta  27+weeks  Pt doing well, no specific c/o  Minimal LOF, no ctx, good FM  Continues antibiotics S/p Betamethasone 7/7/ and 7/8

## 2013-01-19 NOTE — Consult Note (Signed)
Neonatology Consult to Antenatal Patient:  I was asked by Dr. Jackelyn Knife to see this patient in order to provide antenatal counseling due to PPROM on 7/7.  Dawn Haynes was admitted 7/7 at 27 2/[redacted] weeks GA after SROM. She is currently not having active labor. She is getting  IV Amoxicillin and Zithromax, and received Betamethasone on 7/7-8. The baby is female.  I spoke with the patient and her husband. We discussed the worst case of delivery in the next 1-2 days, including usual DR management, possible respiratory complications and need for support, IV access, feedings (mother desires breast feeding, which I encouraged), LOS, Mortality and Morbidity, and long term outcomes. She had some questions which I answered. I offered a NICU tour to any interested family members and would be glad to come back if they have more questions later.  Thank you for asking me to see this patient.  Dawn Sou, MD Neonatologist  The total length of face-to-face or floor/unit time for this encounter was 25 minutes. Counseling and/or coordination of care was 15 minutes of the above.

## 2013-01-20 ENCOUNTER — Encounter (HOSPITAL_COMMUNITY): Payer: Self-pay | Admitting: *Deleted

## 2013-01-20 LAB — TYPE AND SCREEN
ABO/RH(D): A POS
Antibody Screen: NEGATIVE

## 2013-01-20 NOTE — Progress Notes (Signed)
Patient ID: Dawn Haynes, female   DOB: 04-21-1981, 32 y.o.   MRN: [id number] PPROM 27+ weeks Low-lying placenta and transverse lie  Pt doing well no contractions or VB Good FM FHR reassuring on monitoring  Finishing antibiotics, on po and will d/c saline lock Understands delivery would be a c-section if delivers in next week S/p betamethasone and NICU consult

## 2013-01-21 NOTE — Progress Notes (Signed)
HD #6, [redacted]W[redacted]D, PPROM, transverse lie with low-lying placenta Doing ok, gets about 5 cramps in her bottom daily, does not feel like they are ctx, leaking fluid, +FM, no bleeding Afeb, VSS FHT- Cat II this am, mod variability, some variable decels-one a little prolonged, overall looked ok Continue rest and observation

## 2013-01-21 NOTE — Progress Notes (Signed)
Pt stating that she just felt a contraction, Cardio readjusted, unable to determine if tracing maternal HR or FHR.  RN informing pt that we will leave the cardio on for a bit longer this morning.

## 2013-01-21 NOTE — Progress Notes (Signed)
Pt off the monitor after reassurring FHR  

## 2013-01-22 ENCOUNTER — Encounter (HOSPITAL_COMMUNITY): Payer: Self-pay | Admitting: Anesthesiology

## 2013-01-22 ENCOUNTER — Encounter (HOSPITAL_COMMUNITY)
Admission: AD | Disposition: A | Discharge status: Home / Self Care | Payer: Self-pay | Source: Ambulatory Visit | Attending: Obstetrics and Gynecology

## 2013-01-22 ENCOUNTER — Encounter (HOSPITAL_COMMUNITY): Payer: Self-pay | Admitting: *Deleted

## 2013-01-22 ENCOUNTER — Inpatient Hospital Stay (HOSPITAL_COMMUNITY): Payer: Medicaid Other | Admitting: Anesthesiology

## 2013-01-22 DIAGNOSIS — O8612 Endometritis following delivery: Secondary | ICD-10-CM

## 2013-01-22 DIAGNOSIS — O4410 Placenta previa with hemorrhage, unspecified trimester: Secondary | ICD-10-CM

## 2013-01-22 DIAGNOSIS — Z98891 History of uterine scar from previous surgery: Secondary | ICD-10-CM

## 2013-01-22 DIAGNOSIS — O4290 Premature rupture of membranes, unspecified as to length of time between rupture and onset of labor, unspecified weeks of gestation: Secondary | ICD-10-CM

## 2013-01-22 SURGERY — Surgical Case
Anesthesia: General | Site: Abdomen | Wound class: Clean Contaminated

## 2013-01-22 MED ORDER — ZOLPIDEM TARTRATE 5 MG PO TABS: 5.0000 mg | ORAL_TABLET | Freq: Every evening | ORAL | Status: DC | PRN

## 2013-01-22 MED ORDER — PROPOFOL 10 MG/ML IV BOLUS
INTRAVENOUS | Status: DC | PRN
  Administered 2013-01-22: 200 mg via INTRAVENOUS

## 2013-01-22 MED ORDER — IBUPROFEN 600 MG PO TABS
600.0000 mg | ORAL_TABLET | Freq: Four times a day (QID) | ORAL | Status: DC
  Administered 2013-01-23 – 2013-01-27 (×18): 600 mg via ORAL
  Filled 2013-01-22 (×18): qty 1

## 2013-01-22 MED ORDER — MENTHOL 3 MG MT LOZG: 1.0000 | LOZENGE | OROMUCOSAL | Status: DC | PRN

## 2013-01-22 MED ORDER — MEPERIDINE HCL 25 MG/ML IJ SOLN
6.2500 mg | INTRAMUSCULAR | Status: DC | PRN
  Administered 2013-01-22: 6.25 mg via INTRAVENOUS

## 2013-01-22 MED ORDER — 0.9 % SODIUM CHLORIDE (POUR BTL) OPTIME
TOPICAL | Status: DC | PRN
  Administered 2013-01-22: 200 mL

## 2013-01-22 MED ORDER — MEPERIDINE HCL 25 MG/ML IJ SOLN
INTRAMUSCULAR | Status: AC
  Filled 2013-01-22: qty 1

## 2013-01-22 MED ORDER — OXYTOCIN 40 UNITS IN LACTATED RINGERS INFUSION - SIMPLE MED: 62.5000 mL/h | INTRAVENOUS | Status: AC

## 2013-01-22 MED ORDER — SIMETHICONE 80 MG PO CHEW: 80.0000 mg | CHEWABLE_TABLET | ORAL | Status: DC | PRN

## 2013-01-22 MED ORDER — SIMETHICONE 80 MG PO CHEW
80.0000 mg | CHEWABLE_TABLET | Freq: Three times a day (TID) | ORAL | Status: DC
  Administered 2013-01-23 – 2013-01-27 (×13): 80 mg via ORAL
  Filled 2013-01-22: qty 1

## 2013-01-22 MED ORDER — FENTANYL CITRATE 0.05 MG/ML IJ SOLN
INTRAMUSCULAR | Status: AC
  Filled 2013-01-22: qty 2

## 2013-01-22 MED ORDER — MAGNESIUM HYDROXIDE 400 MG/5ML PO SUSP: 30.0000 mL | ORAL | Status: DC | PRN

## 2013-01-22 MED ORDER — LACTATED RINGERS IV SOLN
INTRAVENOUS | Status: DC
  Administered 2013-01-23: 02:00:00 via INTRAVENOUS

## 2013-01-22 MED ORDER — SUCCINYLCHOLINE CHLORIDE 20 MG/ML IJ SOLN
INTRAMUSCULAR | Status: DC | PRN
  Administered 2013-01-22: 120 mg via INTRAVENOUS

## 2013-01-22 MED ORDER — CEFAZOLIN SODIUM-DEXTROSE 2-3 GM-% IV SOLR
INTRAVENOUS | Status: DC | PRN
  Administered 2013-01-22: 2 g via INTRAVENOUS

## 2013-01-22 MED ORDER — HYDROMORPHONE 0.3 MG/ML IV SOLN
INTRAVENOUS | Status: DC
  Administered 2013-01-22: 23:00:00 via INTRAVENOUS
  Administered 2013-01-23: 1.39 mg via INTRAVENOUS
  Administered 2013-01-23: 1.59 mg via INTRAVENOUS
  Filled 2013-01-22: qty 25

## 2013-01-22 MED ORDER — PROMETHAZINE HCL 25 MG/ML IJ SOLN: 6.2500 mg | INTRAMUSCULAR | Status: DC | PRN

## 2013-01-22 MED ORDER — LANOLIN HYDROUS EX OINT: 1.0000 "application " | TOPICAL_OINTMENT | CUTANEOUS | Status: DC | PRN

## 2013-01-22 MED ORDER — MIDAZOLAM HCL 5 MG/5ML IJ SOLN
INTRAMUSCULAR | Status: DC | PRN
  Administered 2013-01-22: 2 mg via INTRAVENOUS

## 2013-01-22 MED ORDER — TETANUS-DIPHTH-ACELL PERTUSSIS 5-2.5-18.5 LF-MCG/0.5 IM SUSP: 0.5000 mL | Freq: Once | INTRAMUSCULAR | Status: DC

## 2013-01-22 MED ORDER — FENTANYL CITRATE 0.05 MG/ML IJ SOLN
25.0000 ug | INTRAMUSCULAR | Status: DC | PRN
  Administered 2013-01-22 (×4): 50 ug via INTRAVENOUS

## 2013-01-22 MED ORDER — ROCURONIUM BROMIDE 100 MG/10ML IV SOLN
INTRAVENOUS | Status: DC | PRN
  Administered 2013-01-22: 25 mg via INTRAVENOUS

## 2013-01-22 MED ORDER — OXYTOCIN 10 UNIT/ML IJ SOLN
INTRAMUSCULAR | Status: DC | PRN
  Administered 2013-01-22 (×2): 20 [IU] via INTRAMUSCULAR

## 2013-01-22 MED ORDER — FENTANYL CITRATE 0.05 MG/ML IJ SOLN
INTRAMUSCULAR | Status: DC | PRN
  Administered 2013-01-22 (×2): 100 ug via INTRAVENOUS

## 2013-01-22 MED ORDER — CITRIC ACID-SODIUM CITRATE 334-500 MG/5ML PO SOLN
ORAL | Status: AC
  Administered 2013-01-22: 1 mL
  Filled 2013-01-22: qty 15

## 2013-01-22 MED ORDER — MORPHINE SULFATE 0.5 MG/ML IJ SOLN
INTRAMUSCULAR | Status: AC
  Filled 2013-01-22: qty 10

## 2013-01-22 MED ORDER — OXYCODONE-ACETAMINOPHEN 5-325 MG PO TABS
1.0000 | ORAL_TABLET | ORAL | Status: DC | PRN
  Administered 2013-01-23 – 2013-01-24 (×4): 2 via ORAL
  Administered 2013-01-24 – 2013-01-26 (×4): 1 via ORAL
  Administered 2013-01-27: 2 via ORAL
  Filled 2013-01-22 (×2): qty 2
  Filled 2013-01-22 (×2): qty 1
  Filled 2013-01-22: qty 2
  Filled 2013-01-22: qty 1
  Filled 2013-01-22: qty 2
  Filled 2013-01-22: qty 1
  Filled 2013-01-22: qty 2

## 2013-01-22 MED ORDER — ONDANSETRON HCL 4 MG/2ML IJ SOLN
INTRAMUSCULAR | Status: DC | PRN
  Administered 2013-01-22: 4 mg via INTRAVENOUS

## 2013-01-22 MED ORDER — MEASLES, MUMPS & RUBELLA VAC ~~LOC~~ INJ
0.5000 mL | INJECTION | Freq: Once | SUBCUTANEOUS | Status: DC
  Filled 2013-01-22: qty 0.5

## 2013-01-22 MED ORDER — NEOSTIGMINE METHYLSULFATE 1 MG/ML IJ SOLN
INTRAMUSCULAR | Status: DC | PRN
  Administered 2013-01-22: 4 mg via INTRAVENOUS

## 2013-01-22 MED ORDER — LACTATED RINGERS IV SOLN: INTRAVENOUS | Status: DC

## 2013-01-22 MED ORDER — DIPHENHYDRAMINE HCL 12.5 MG/5ML PO ELIX: 12.5000 mg | ORAL_SOLUTION | Freq: Four times a day (QID) | ORAL | Status: DC | PRN

## 2013-01-22 MED ORDER — NALOXONE HCL 0.4 MG/ML IJ SOLN: 0.4000 mg | INTRAMUSCULAR | Status: DC | PRN

## 2013-01-22 MED ORDER — WITCH HAZEL-GLYCERIN EX PADS: 1.0000 "application " | MEDICATED_PAD | CUTANEOUS | Status: DC | PRN

## 2013-01-22 MED ORDER — SENNOSIDES-DOCUSATE SODIUM 8.6-50 MG PO TABS
2.0000 | ORAL_TABLET | Freq: Every day | ORAL | Status: DC
  Administered 2013-01-23 – 2013-01-25 (×3): 2 via ORAL

## 2013-01-22 MED ORDER — GLYCOPYRROLATE 0.2 MG/ML IJ SOLN
INTRAMUSCULAR | Status: DC | PRN
  Administered 2013-01-22: 0.6 mg via INTRAVENOUS

## 2013-01-22 MED ORDER — SODIUM CHLORIDE 0.9 % IJ SOLN: 9.0000 mL | INTRAMUSCULAR | Status: DC | PRN

## 2013-01-22 MED ORDER — PRENATAL MULTIVITAMIN CH
1.0000 | ORAL_TABLET | Freq: Every day | ORAL | Status: DC
  Administered 2013-01-23 – 2013-01-27 (×5): 1 via ORAL
  Filled 2013-01-22 (×5): qty 1

## 2013-01-22 MED ORDER — DIBUCAINE 1 % RE OINT: 1.0000 "application " | TOPICAL_OINTMENT | RECTAL | Status: DC | PRN

## 2013-01-22 MED ORDER — ONDANSETRON HCL 4 MG/2ML IJ SOLN: 4.0000 mg | Freq: Four times a day (QID) | INTRAMUSCULAR | Status: DC | PRN

## 2013-01-22 MED ORDER — LACTATED RINGERS IV SOLN
INTRAVENOUS | Status: DC | PRN
  Administered 2013-01-22: 20:00:00 via INTRAVENOUS

## 2013-01-22 MED ORDER — MIDAZOLAM HCL 2 MG/2ML IJ SOLN
INTRAMUSCULAR | Status: AC
  Filled 2013-01-22: qty 2

## 2013-01-22 MED ORDER — DIPHENHYDRAMINE HCL 50 MG/ML IJ SOLN: 12.5000 mg | Freq: Four times a day (QID) | INTRAMUSCULAR | Status: DC | PRN

## 2013-01-22 SURGICAL SUPPLY — 29 items
CLAMP CORD UMBIL (MISCELLANEOUS) IMPLANT
CLOTH BEACON ORANGE TIMEOUT ST (SAFETY) ×2 IMPLANT
CONTAINER PREFILL 10% NBF 15ML (MISCELLANEOUS) IMPLANT
DRAPE LG THREE QUARTER DISP (DRAPES) ×2 IMPLANT
DRSG OPSITE POSTOP 4X10 (GAUZE/BANDAGES/DRESSINGS) ×2 IMPLANT
DURAPREP 26ML APPLICATOR (WOUND CARE) IMPLANT
ELECT REM PT RETURN 9FT ADLT (ELECTROSURGICAL) ×2
ELECTRODE REM PT RTRN 9FT ADLT (ELECTROSURGICAL) ×1 IMPLANT
EXTRACTOR VACUUM KIWI (MISCELLANEOUS) IMPLANT
EXTRACTOR VACUUM M CUP 4 TUBE (SUCTIONS) IMPLANT
GLOVE BIO SURGEON STRL SZ8 (GLOVE) ×2 IMPLANT
GLOVE ORTHO TXT STRL SZ7.5 (GLOVE) ×2 IMPLANT
GOWN STRL REIN XL XLG (GOWN DISPOSABLE) ×4 IMPLANT
KIT ABG SYR 3ML LUER SLIP (SYRINGE) ×2 IMPLANT
NEEDLE HYPO 25X5/8 SAFETYGLIDE (NEEDLE) ×2 IMPLANT
NS IRRIG 1000ML POUR BTL (IV SOLUTION) ×2 IMPLANT
PACK C SECTION WH (CUSTOM PROCEDURE TRAY) ×2 IMPLANT
PAD OB MATERNITY 4.3X12.25 (PERSONAL CARE ITEMS) ×2 IMPLANT
RTRCTR C-SECT PINK 25CM LRG (MISCELLANEOUS) ×2 IMPLANT
STAPLER VISISTAT 35W (STAPLE) ×2 IMPLANT
SUT CHROMIC 1 CTX 36 (SUTURE) ×4 IMPLANT
SUT PLAIN 0 NONE (SUTURE) IMPLANT
SUT PLAIN 2 0 XLH (SUTURE) ×2 IMPLANT
SUT VIC AB 0 CT1 27 (SUTURE) ×3
SUT VIC AB 0 CT1 27XBRD ANBCTR (SUTURE) ×3 IMPLANT
SUT VIC AB 4-0 KS 27 (SUTURE) ×2 IMPLANT
TOWEL OR 17X24 6PK STRL BLUE (TOWEL DISPOSABLE) ×6 IMPLANT
TRAY FOLEY CATH 14FR (SET/KITS/TRAYS/PACK) ×2 IMPLANT
WATER STERILE IRR 1000ML POUR (IV SOLUTION) ×2 IMPLANT

## 2013-01-22 NOTE — Anesthesia Postprocedure Evaluation (Signed)
  Anesthesia Post-op Note  Patient: Dawn Haynes  Procedure(s) Performed: Procedure(s) (LRB): primary CESAREAN SECTION of baby boy at 73 (N/A)  Patient Location: PACU  Anesthesia Type: General  Level of Consciousness: awake and alert   Airway and Oxygen Therapy: Patient Spontanous Breathing  Post-op Pain: mild  Post-op Assessment: Post-op Vital signs reviewed, Patient's Cardiovascular Status Stable, Respiratory Function Stable, Patent Airway and No signs of Nausea or vomiting  Last Vitals:  Filed Vitals:   01/22/13 2200  BP: 121/72  Pulse: 81  Temp: 36.9 C  Resp: 16    Post-op Vital Signs: stable   Complications: No apparent anesthesia complications

## 2013-01-22 NOTE — OR Nursing (Signed)
50 ml blood loss during fundal massage by DLWegner RN

## 2013-01-22 NOTE — Progress Notes (Signed)
HD#7, [redacted]W[redacted]D, PPROM Doing well, fluid with a pinkish tinge today, no significant ctx, +FM Afeb, VSS FHT- Cat I Continue observation

## 2013-01-22 NOTE — Anesthesia Preprocedure Evaluation (Addendum)
Anesthesia Evaluation  Patient identified by MRN, date of birth, ID band Patient awake    Reviewed: Allergy & Precautions, H&P , NPO status , Patient's Chart, lab work & pertinent test results, reviewed documented beta blocker date and time   Airway  TM Distance: >3 FB Neck ROM: full    Dental no notable dental hx.    Pulmonary neg pulmonary ROS,  breath sounds clear to auscultation  Pulmonary exam normal       Cardiovascular Exercise Tolerance: Good negative cardio ROS  Rhythm:regular Rate:Normal     Neuro/Psych negative neurological ROS  negative psych ROS   GI/Hepatic negative GI ROS, Neg liver ROS,   Endo/Other  negative endocrine ROS  Renal/GU negative Renal ROS  negative genitourinary   Musculoskeletal   Abdominal   Peds  Hematology negative hematology ROS (+)   Anesthesia Other Findings   Reproductive/Obstetrics negative OB ROS (+) Pregnancy                           Anesthesia Physical Anesthesia Plan  ASA: II and emergent  Anesthesia Plan: General, General ETT, Rapid Sequence and Cricoid Pressure   Post-op Pain Management:    Induction:   Airway Management Planned:   Additional Equipment:   Intra-op Plan:   Post-operative Plan:   Informed Consent: I have reviewed the patients History and Physical, chart, labs and discussed the procedure including the risks, benefits and alternatives for the proposed anesthesia with the patient or authorized representative who has indicated his/her understanding and acceptance.   Dental Advisory Given  Plan Discussed with: Surgeon  Anesthesia Plan Comments: (True emergent c/s for prolapsed cord. Brief history only. RSI GA.)       Anesthesia Quick Evaluation

## 2013-01-22 NOTE — Transfer of Care (Signed)
Immediate Anesthesia Transfer of Care Note  Patient: Dawn Haynes  Procedure(s) Performed: Procedure(s): primary CESAREAN SECTION of baby boy at 51 (N/A)  Patient Location: PACU  Anesthesia Type:General  Level of Consciousness: awake and alert   Airway & Oxygen Therapy: Patient Spontanous Breathing and Patient connected to nasal cannula oxygen  Post-op Assessment: Report given to PACU RN and Post -op Vital signs reviewed and stable  Post vital signs: Reviewed and stable  Complications: No apparent anesthesia complications

## 2013-01-22 NOTE — Progress Notes (Signed)
At 1936 patient's spouse came out of room to nurses desk stating his wife's umbilical cord was "hanging out"; Malachi Bonds RN and Fransisco Beau, RN Charge to patient's bedside; cord visually noted; patient placed on monitor by Lucas Mallow, RN Maryann Alar, RN called Dr. Jackelyn Knife, Anethesia, OR team, NICU team, and FP for backup. Jewlery removed, so citrate given; 18g IV started in left hand. Explanation given to family; family verbalized understanding; Dr Marice Potter, Dr. Thad Ranger at bedside; [facility] nurse at bedside; L&D charge at bedside; patient taken to OR @ 1950.

## 2013-01-22 NOTE — Op Note (Signed)
Preoperative diagnosis: Intrauterine pregnancy at 28 weeks, preterm premature rupture membranes, prolapsed umbilical cord, breech vs. transverse presentation Postoperative diagnosis: Same with transverse presentation Procedure: Stat classical cesarean section Surgeon: Lavina Hamman M.D. Assistant: Nicholaus Bloom M.D. Anesthesia: Gen. Endotracheal tube Findings: She had a prolapsed umbilical cord with an adequate pulse. Upon entering the abdominal cavity there was very minimal lower uterine segment so a vertical uterine incision was made. The baby was in a back down transverse position. The NICU team was in attendance, I do not have Apgars or weight, I did not get enough blood for arterial cord pH. Estimated blood loss: 1000 cc Complications: None  Procedure in detail:  I was called by the nurse on antenatal and told that the patient had a prolapsed umbilical cord. I instructed them to call the faculty practice attending and the operating room and get her to the operating room for a stat cesarean section. She was taken to the operating room placed on the table. By the time I arrived she had just been placed on the table. The abdomen was prepped and draped with a splash of Betadine and a Foley catheter was inserted. General anesthesia was induced. Abdomen was entered via a standard Pfannenstiel incision mostly with blunt technique. The Alexis disposable self-retaining retractor was placed. Again there was minimal lower uterine segment development so a vertical incision was made. Once the uterine cavity was entered the incision was extended superiorly and inferiorly with bandage scissors careful to avoid the bladder. The baby was in a transverse position with the head to the upper maternal right and the abdomen was the presenting part. I was able to reach into the lower left uterus and delivered the breech. I then was able to with some difficulty sweep the arms across the chest and then had to reach into the  uterus to deliver the head. Cord was doubly clamped and cut and the infant handed to the awaiting pediatric team. I attempted to obtain arterial cord for pH but apparently this was an inadequate specimen. Cord blood was obtained. The placenta delivered spontaneously and was sent for pathology. Uterus was wiped with a clean lap pad. The uterus was exteriorized and inspected. Tubes and ovaries were normal. Uterus was then replaced in the abdominal cavity. The uterine incision was closed with 2 layers to the myometrium with running locking sutures of #1 chromic. The serosa and outer myometrium were closed with a baseball stitch also with #1 chromic. This achieved adequate closure and adequate hemostasis. A piece of Interceed was placed over this hemostatic incision. Peritoneum was then closed with running 2-0 Vicryl. Sub-fascial space was irrigated and made hemostatic with electrocautery. Fascia was closed in running fashion starting at both ends and meeting in the middle with 0 Vicryl. Subcutaneous tissue was irrigated and made hemostatic with electrocautery. Subcutaneous tissue was closed with running 2-0 plain gut suture. Skin was closed with staples followed by a sterile dressing. Patient tolerated procedure well. She received Ancef 2 g after the procedure. She was taken to the recovery in stable condition.

## 2013-01-23 ENCOUNTER — Encounter (HOSPITAL_COMMUNITY): Payer: Self-pay | Admitting: *Deleted

## 2013-01-23 LAB — CBC
HCT: 29.9 % — ABNORMAL LOW (ref 36.0–46.0)
Hemoglobin: 10 g/dL — ABNORMAL LOW (ref 12.0–15.0)
MCHC: 33.4 g/dL (ref 30.0–36.0)
RDW: 13.5 % (ref 11.5–15.5)
WBC: 14.6 10*3/uL — ABNORMAL HIGH (ref 4.0–10.5)

## 2013-01-23 NOTE — Progress Notes (Signed)
This was a follow-up visit with Dawn Haynes, whom I saw on antenatal.  She was in good spirits and reported that her baby, Dawn Haynes, is doing well.  I visited with her family as well, including two of her other children.  We spoke about the difficulties of balancing having children at home and having one here in the NICU.  I told them about the [employer] and the parent resource room.  They are still trying to figure out how they will balance everything now that he is here, but it seems that they are supporting each other well.  [employer] Pager, [phone] 12:20 PM   01/23/13 1200  Clinical Encounter Type  Visited With Patient;Patient and family together  Visit Type Follow-up

## 2013-01-23 NOTE — Anesthesia Postprocedure Evaluation (Signed)
Anesthesia Post Note  Patient: Dawn Haynes  Procedure(s) Performed: Procedure(s) (LRB): primary CESAREAN SECTION of baby boy at 42 (N/A)  Anesthesia type: General  Patient location: Women[facility] Unit  Post pain: Pain level controlled  Post assessment: Post-op Vital signs reviewed  Last Vitals:  Filed Vitals:   01/23/13 0506  BP: 116/76  Pulse: 102  Temp: 37.4 C  Resp: 20    Post vital signs: Reviewed  Level of consciousness: sedated  Complications: No apparent anesthesia complications

## 2013-01-23 NOTE — Progress Notes (Signed)
Ur chart review completed.  

## 2013-01-23 NOTE — Progress Notes (Signed)
Subjective: Postpartum Day #1: Cesarean Delivery Patient reports incisional pain, tolerating PO and no problems voiding.  Was in a fair amount of pain, feels better since voiding.  Objective: Vital signs in last 24 hours: Temp:  [97.8 F (36.6 C)-99.3 F (37.4 C)] 99.3 F (37.4 C) (07/14 0506) Pulse Rate:  [80-102] 102 (07/14 0506) Resp:  [14-20] 20 (07/14 0506) BP: (107-129)/(61-85) 116/76 mmHg (07/14 0506) SpO2:  [96 %-100 %] 99 % (07/14 0515)  Physical Exam:  General: alert Lochia: appropriate Uterine Fundus: firm Incision: dressing c/d/i   Recent Labs  01/23/13 0511  HGB 10.0*  HCT 29.9*    Assessment/Plan: Status post Cesarean section. Doing well postoperatively.  Continue current care, ambulate.  Judith Campillo D 01/23/2013, 8:31 AM

## 2013-01-24 MED ORDER — MEASLES, MUMPS & RUBELLA VAC ~~LOC~~ INJ
0.5000 mL | INJECTION | Freq: Once | SUBCUTANEOUS | Status: AC
  Administered 2013-01-25: 0.5 mL via SUBCUTANEOUS
  Filled 2013-01-24: qty 0.5

## 2013-01-24 NOTE — Progress Notes (Signed)
POD #2 Doing ok, sore, baby stable in NICU Afeb, VSS Abd- soft, fundus firm, dressing intact Continue routine care

## 2013-01-25 LAB — COMPREHENSIVE METABOLIC PANEL WITH GFR
ALT: 87 U/L — ABNORMAL HIGH (ref 0–35)
AST: 107 U/L — ABNORMAL HIGH (ref 0–37)
Albumin: 2.5 g/dL — ABNORMAL LOW (ref 3.5–5.2)
Alkaline Phosphatase: 206 U/L — ABNORMAL HIGH (ref 39–117)
BUN: 8 mg/dL (ref 6–23)
CO2: 24 meq/L (ref 19–32)
Calcium: 9 mg/dL (ref 8.4–10.5)
Chloride: 103 meq/L (ref 96–112)
Creatinine, Ser: 0.62 mg/dL (ref 0.50–1.10)
GFR calc Af Amer: >90 mL/min (ref >90)
GFR calc non Af Amer: >90 mL/min (ref >90)
Glucose, Bld: 119 mg/dL — ABNORMAL HIGH (ref 70–99)
Potassium: 3.6 meq/L (ref 3.5–5.1)
Sodium: 139 meq/L (ref 135–145)
Total Bilirubin: 0.4 mg/dL (ref 0.3–1.2)
Total Protein: 6.6 g/dL (ref 6.0–8.3)

## 2013-01-25 MED ORDER — ACETAMINOPHEN 500 MG PO TABS
500.0000 mg | ORAL_TABLET | Freq: Once | ORAL | Status: AC
  Administered 2013-01-25: 500 mg via ORAL
  Filled 2013-01-25: qty 1

## 2013-01-25 MED ORDER — GENTAMICIN SULFATE 40 MG/ML IJ SOLN
Freq: Three times a day (TID) | INTRAVENOUS | Status: DC
  Administered 2013-01-25 – 2013-01-27 (×6): via INTRAVENOUS
  Filled 2013-01-25 (×8): qty 3.25

## 2013-01-25 NOTE — Progress Notes (Signed)
ANTIBIOTIC CONSULT NOTE - INITIAL  Pharmacy Consult for Gentamicin Indication: Endometritis  No Known Allergies  Patient Measurements: Height: 5\' 2"  (157.5 cm) Weight: 188 lb 12.8 oz (85.639 kg) IBW/kg (Calculated) : 50.1 Adjusted Body Weight: 60.7  Vital Signs: Temp: 102.4 F (39.1 C) (07/16 1715) Temp src: Oral (07/16 1715) BP: 120/77 mmHg (07/16 1715) Pulse Rate: 106 (07/16 1715)  Labs:  Recent Labs  01/23/13 0511 01/25/13 1925  WBC 14.6*  --   HGB 10.0*  --   PLT 229  --   CREATININE  --  0.62   Estimated Creatinine Clearance: 101.5 ml/min (by C-G formula based on Cr of 0.62). No results found for this basename: VANCOTROUGH, VANCOPEAK, VANCORANDOM, GENTTROUGH, GENTPEAK, GENTRANDOM, TOBRATROUGH, TOBRAPEAK, TOBRARND, AMIKACINPEAK, AMIKACINTROU, AMIKACIN,  in the last 72 hours   Microbiology: Recent Results (from the past 720 hour(s))  CULTURE, BETA STREP (GROUP B ONLY)     Status: None   Collection Time    01/16/13  9:54 PM      Result Value Range Status   Specimen Description VAGINAL/RECTAL   Final   Special Requests NONE   Final   Culture NO GROUP B STREP (S.AGALACTIAE) ISOLATED   Final   Report Status 01/19/2013 FINAL   Final    Medical History: Past Medical History  Diagnosis Date  . Spina bifida   . Arthritis     Degenerative disc lumbar  . Chlamydia   . Blood transfusion without reported diagnosis 2009    after postpartum hemorrhage  . Postpartum hemorrhage 2009    28 week fetal demise    Medication: Clindamycin 900mg  IV q8h Assessment: 32yo S/P LTCS on 01-22-13 at 28 weeks due to prolapsed cord. Pt now with temp w/ probable endometritis.  Goal of Therapy:  Gentamicin peaks 6-75mcg/ml  And trough < 1 mcg/ml  Plan:  1. Gentamicin 130mg  IV q8h. 2. Will continue to follow and assess need for kinetic Gentamicin levels based on pt's clinical status and duration of therapy. Thanks!  Claybon Jabs 01/25/2013,8:14 PM

## 2013-01-25 NOTE — Progress Notes (Signed)
Patient ID: Dawn Haynes, female   DOB: [birth year], 33 y.o.   MRN: [id number] Pt developed chills and Temp = 102.4 She has had no cough, SOB, urinary sx or GI sx. She states her lochia has diminished. PE Heart and lungs normal. The breasts are not engorged or tender. The abdomen is soft with slight tenderness in the right low abdomen. I would assume the fever is from endometritis. I will get blood cultures and begin antibiotics Will also check CBC and CMET

## 2013-01-25 NOTE — Lactation Note (Signed)
This note was copied from the chart of Dawn Haynes. Lactation Consultation Note   Follow up consult with this mom of a NICU baby, now 63 hours post partum. She hs been pumping about 5-6 times a day, and i explained how to get the best supply, 8 times a day, every 3 hours works best. i gave mom the paper work to fill out for a DEP loaner pump, if she gets discharged tomorrow.     I heard later today, from baby's nurse, that mom is running a high fever, and is being worked up dor and infection.  I will follow this family in the NICU.  Patient Name: Dawn Gage Treiber [id number] Date: 01/25/2013     Maternal Data    Feeding Feeding Type: Breast Milk Feeding method: Tube/Gavage Length of feed:  (gravity)  LATCH Score/Interventions                      Lactation Tools Discussed/Used     Consult Status      Dawn Haynes 01/25/2013, 6:35 PM

## 2013-01-25 NOTE — Progress Notes (Signed)
POD #3 Doing well, feeling better today Afeb, VSS Abd- soft, fundus firm, dressing clean-just changed by nurse Continue routine care, plan for discharge tomorrow

## 2013-01-26 LAB — URINALYSIS, ROUTINE W REFLEX MICROSCOPIC
Specific Gravity, Urine: >1.03 — ABNORMAL HIGH (ref 1.005–1.030)
Urobilinogen, UA: 0.2 mg/dL (ref 0.0–1.0)
pH: 6 (ref 5.0–8.0)

## 2013-01-26 LAB — CBC WITH DIFFERENTIAL/PLATELET
Basophils Absolute: 0 10*3/uL (ref 0.0–0.1)
Eosinophils Absolute: 0.1 10*3/uL (ref 0.0–0.7)
HCT: 27.3 % — ABNORMAL LOW (ref 36.0–46.0)
Lymphs Abs: 1.4 10*3/uL (ref 0.7–4.0)
MCHC: 33 g/dL (ref 30.0–36.0)
MCV: 93.5 fL (ref 78.0–100.0)
Monocytes Absolute: 1 10*3/uL (ref 0.1–1.0)
Neutro Abs: 5 10*3/uL (ref 1.7–7.7)
Platelets: 205 10*3/uL (ref 150–400)
RDW: 13.5 % (ref 11.5–15.5)

## 2013-01-26 LAB — URINE CULTURE: Culture: NO GROWTH

## 2013-01-26 LAB — URINE MICROSCOPIC-ADD ON

## 2013-01-26 NOTE — Progress Notes (Signed)
POD #4 Feels better today, felt bad with fever yesterday Tmax 103.1, Afeb x 12 hrs, VSS Abd- soft, fundus firm, incision intact Continue Gent and Clinda today for possible endometritis, await culture results.  Will remove staples tomorrow.

## 2013-01-27 LAB — URINE CULTURE
Colony Count: NO GROWTH
Culture: NO GROWTH

## 2013-01-27 MED ORDER — IBUPROFEN 600 MG PO TABS: 600.0000 mg | ORAL_TABLET | Freq: Four times a day (QID) | ORAL | Status: DC

## 2013-01-27 MED ORDER — OXYCODONE-ACETAMINOPHEN 5-325 MG PO TABS: 1.0000 | ORAL_TABLET | ORAL | Status: DC | PRN

## 2013-01-27 NOTE — Progress Notes (Signed)
POD #5 Doing well, baby stable Afeb x 36 hrs, VSS Abd- soft, fundus firm, incision healing well, no evidence of infection, staples removed and steri-strips applied All cultures negative so far Continue current care, continue [place] today, consider d/c home this pm if remains afebrile

## 2013-01-27 NOTE — Progress Notes (Signed)
Patient ID: Dawn Haynes, female   DOB: [birth year], 32 y.o.   MRN: [id number] No fever all day, will d/c home

## 2013-01-27 NOTE — Progress Notes (Signed)
Pt discharged to home with husband and children.  Condition stable.  Pt ambulated to NICU with her family with plans to leave hospital from there.  Pt home with rented breast pump from lactation department.  No equipment for home ordered at discharge.

## 2013-01-27 NOTE — Discharge Summary (Signed)
Obstetric Discharge Summary Reason for Admission: rupture of membranes Prenatal Procedures: NST and ultrasound Intrapartum Procedures: cesarean: low cervical, transverse Postpartum Procedures: antibiotics Complications-Operative and Postpartum: endometritis Hemoglobin  Date Value Range Status  01/26/2013 9.0* 12.0 - 15.0 g/dL Final     HCT  Date Value Range Status  01/26/2013 27.3* 36.0 - 46.0 % Final    Physical Exam:  General: alert and cooperative Lochia: appropriate Uterine Fundus: firm Incision: healing well, no significant erythema DVT Evaluation: No evidence of DVT seen on physical exam.  Discharge Diagnoses: Preterm pregnancy delivered for cord prolapse at 28 weeks                                        PPROM with inpatient management Discharge Information: Date: 01/27/2013 Activity: pelvic rest Diet: routine Medications: Ibuprofen and Percocet Condition: improved Instructions: refer to practice specific booklet Discharge to: home Follow-up Information   Follow up with MEISINGER,TODD D, MD. Schedule an appointment as soon as possible for a visit in 2 weeks. (incision check)    Contact information:   [address], [address] [place] [address] [phone]       Newborn Data: Live born female  Birth Weight: 2 lb 11 oz (1219 g) APGAR: 2, 4  Baby stable in NICU  Xia Stohr W 01/27/2013, 6:00 PM

## 2013-01-28 LAB — CULTURE, BLOOD (ROUTINE X 2)

## 2013-01-30 NOTE — Progress Notes (Signed)
Clinical Social Work Department PSYCHOSOCIAL ASSESSMENT - MATERNAL/CHILD 01/26/2013  Patient:  Dawn Haynes, Dawn Haynes  Account Number:  [id number]  Admit Date:  01/16/2013  Marjo Bicker Name:   Marton Redwood    Clinical Social Worker:  Lulu Riding, LCSW   Date/Time:  01/26/2013 04:00 PM  Date Referred:        Other referral source:   No referral-NICU admission    I:  FAMILY / HOME ENVIRONMENT Child's legal guardian:  PARENT  Guardian - Name Guardian - Age Guardian - Address  [employer] [address], [place], [place] [address]  Peggyann Juba  same   Other household support members/support persons Name Relationship DOB   SON 16   SON 14   DAUGHTER 13   SON 7   DAUGHTER 3   Other support:   Good supports from family and friends    II  PSYCHOSOCIAL DATA Information Source:  Family Interview  Insurance claims handler Resources Employment:   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   Financial resources:  Medicaid If Medicaid - County:  [employer] / Grade:   Maternity Care Coordinator / Child Services Coordination / Early Interventions:   CC4C  Cultural issues impacting care:   None stated    III  STRENGTHS Strengths  Adequate Resources  Compliance with medical plan  Other - See comment  Supportive family/friends  Understanding of illness   Strength comment:  Pediatric follow up will be at [facility].  Parents have a crib for baby.  CSW made referral to [employer] for baby basics.   IV  RISK FACTORS AND CURRENT PROBLEMS Current Problem:  None     V  SOCIAL WORK ASSESSMENT  CSW met with parents and their two youngest children in MOB's third floor room/304 to introduce myself and complete assessment due to NICU admission.  CSW does not identify any concerns from reviewing MOB's medical record.  Parents were very pleasant and  welcoming of CSW.  They report that everyone is doing well and they seem to have a good understanding of baby's medical situation at this time.  They report having a crib for baby, but that they are in need of other baby basics.  CSW offered to make referral to [facility] for baby items and they were very appreciative.  CSW discussed PPD signs and symptoms and ensured that MOB feels comfortable calling her MD's office if symptoms arise.  CSW encouraged her to call CSW any time also.  MOB states no emotional concerns at this time and understanding signs to watch for.  CSW informed them that they can call CSW any time they would like to meet with the medical team for a conference and to not be alarmed if we call them for a conference.  CSW explained ongoing support services offered by NICU CSW and gave contact information.   VI SOCIAL WORK PLAN Social Work Plan  Psychosocial Support/Ongoing Assessment of Needs   Type of pt/family education:   PPD signs and symptoms   If child protective services report - county:   If child protective services report - date:   Information/referral to community resources comment:   Baby will [facility] to [facility].   Other social work plan:

## 2013-01-31 LAB — CULTURE, BLOOD (ROUTINE X 2): Culture: NO GROWTH

## 2013-02-27 ENCOUNTER — Ambulatory Visit: Payer: Self-pay

## 2013-02-27 NOTE — Lactation Note (Signed)
This note was copied from the chart of Dawn Taniyah Ballow. Lactation Consultation Note   Follow up consult with this mom and baby, now 50 weeks old, 33 2/7 weeks corrected gestation, and weigh 4 lbs 4.4 oz. I assisted mom with latching [name] to mom's breast in football hold, for the first time. I did a pre and post weight. He latched well, ap[peared to be transferring, strong suckles, goo rythym, but with a pre and post weight, it showed he did not transfer any milk this feeding. I told mom we will try again tomorrow, at 12 noon, and use a nipple shield this time, to see if this makes any difference. i explained that heis early and small, and not transferring at this time is normal. Mom seemed fine with this.  Patient Name: Dawn Haynes [id number] Date: 02/27/2013 Reason for consult: Follow-up assessment;NICU baby   Maternal Data    Feeding Feeding Type: Breast Milk Nipple Type: Slow - flow Length of feed: 22 min  LATCH Score/Interventions Latch: Repeated attempts needed to sustain latch, nipple held in mouth throughout feeding, stimulation needed to elicit sucking reflex.  Audible Swallowing: None  Type of Nipple: Everted at rest and after stimulation  Comfort (Breast/Nipple): Soft / non-tender     Hold (Positioning): Assistance needed to correctly position infant at breast and maintain latch. Intervention(s): Breastfeeding basics reviewed;Support Pillows;Position options;Skin to skin  LATCH Score: 6  Lactation Tools Discussed/Used     Consult Status Consult Status: Follow-up Date: 02/28/13 Follow-up type: In-patient    Alfred Levins 02/27/2013, 3:44 PM

## 2013-02-28 ENCOUNTER — Ambulatory Visit: Payer: Self-pay

## 2013-02-28 NOTE — Lactation Note (Signed)
This note was copied from the chart of Dawn Haynes. Lactation Consultation Note    Follow up consult with this mom and baby in the NICU, now 64 weeks old and 33 3/7 weeks corrected gestation.     I fitted mom for a 20 nipple shiled - this was a tight fit for mojm, was just fit in baby's mouth. He suckled on and off for 30 minutes, but transferred nothing (pre and post weight done). Mom was tense, and admitted to having trouble with her letdown.  I told mom to continue  Nuzzling with [name], and as he gets older and bigger, he will do better at breast feeding. Mom aware I can work with her and baby in outpatient lactation also. [name] takes EBM by bottle very well. Mom knows to call for questions/concerns.  Patient Name: Dawn Haynes [id number] Date: 02/28/2013     Maternal Data    Feeding Feeding Type: Breast Milk Nipple Type: Slow - flow Length of feed: 15 min  LATCH Score/Interventions                      Lactation Tools Discussed/Used     Consult Status      Alfred Levins 02/28/2013, 4:28 PM

## 2014-05-14 ENCOUNTER — Encounter (HOSPITAL_COMMUNITY): Payer: Self-pay | Admitting: *Deleted

## 2014-09-04 ENCOUNTER — Other Ambulatory Visit (HOSPITAL_COMMUNITY): Payer: Self-pay | Admitting: *Deleted

## 2014-09-04 DIAGNOSIS — N6320 Unspecified lump in the left breast, unspecified quadrant: Secondary | ICD-10-CM

## 2014-09-13 ENCOUNTER — Ambulatory Visit (HOSPITAL_COMMUNITY): Payer: Medicaid Other | Attending: Obstetrics and Gynecology

## 2014-09-13 ENCOUNTER — Other Ambulatory Visit: Payer: Medicaid Other

## 2015-06-11 ENCOUNTER — Encounter (HOSPITAL_COMMUNITY): Payer: Self-pay | Admitting: *Deleted

## 2015-06-11 ENCOUNTER — Inpatient Hospital Stay (HOSPITAL_COMMUNITY): Payer: Medicaid Other

## 2015-06-11 ENCOUNTER — Inpatient Hospital Stay (HOSPITAL_COMMUNITY)
Admission: AD | Admit: 2015-06-11 | Discharge: 2015-06-11 | Disposition: A | Discharge status: Home / Self Care | Payer: Medicaid Other | Source: Ambulatory Visit | Attending: Obstetrics & Gynecology | Admitting: Obstetrics & Gynecology

## 2015-06-11 DIAGNOSIS — Z3A01 Less than 8 weeks gestation of pregnancy: Secondary | ICD-10-CM | POA: Insufficient documentation

## 2015-06-11 DIAGNOSIS — O26899 Other specified pregnancy related conditions, unspecified trimester: Secondary | ICD-10-CM

## 2015-06-11 DIAGNOSIS — O26891 Other specified pregnancy related conditions, first trimester: Secondary | ICD-10-CM | POA: Diagnosis present

## 2015-06-11 DIAGNOSIS — R1030 Lower abdominal pain, unspecified: Secondary | ICD-10-CM

## 2015-06-11 DIAGNOSIS — R748 Abnormal levels of other serum enzymes: Secondary | ICD-10-CM | POA: Diagnosis not present

## 2015-06-11 DIAGNOSIS — R109 Unspecified abdominal pain: Secondary | ICD-10-CM | POA: Diagnosis not present

## 2015-06-11 DIAGNOSIS — O9989 Other specified diseases and conditions complicating pregnancy, childbirth and the puerperium: Secondary | ICD-10-CM | POA: Diagnosis not present

## 2015-06-11 DIAGNOSIS — R1011 Right upper quadrant pain: Secondary | ICD-10-CM | POA: Insufficient documentation

## 2015-06-11 DIAGNOSIS — O9229 Other disorders of breast associated with pregnancy and the puerperium: Secondary | ICD-10-CM | POA: Diagnosis not present

## 2015-06-11 DIAGNOSIS — N6320 Unspecified lump in the left breast, unspecified quadrant: Secondary | ICD-10-CM

## 2015-06-11 DIAGNOSIS — N63 Unspecified lump in breast: Secondary | ICD-10-CM | POA: Diagnosis not present

## 2015-06-11 DIAGNOSIS — O3680X0 Pregnancy with inconclusive fetal viability, not applicable or unspecified: Secondary | ICD-10-CM

## 2015-06-11 LAB — COMPREHENSIVE METABOLIC PANEL
ALBUMIN: 3.9 g/dL (ref 3.5–5.0)
ALK PHOS: 316 U/L — AB (ref 38–126)
ALT: 163 U/L — ABNORMAL HIGH (ref 14–54)
ANION GAP: 11 (ref 5–15)
AST: 272 U/L — AB (ref 15–41)
BUN: 15 mg/dL (ref 6–20)
CALCIUM: 9 mg/dL (ref 8.9–10.3)
CO2: 23 mmol/L (ref 22–32)
Chloride: 100 mmol/L — ABNORMAL LOW (ref 101–111)
Creatinine, Ser: 0.81 mg/dL (ref 0.44–1.00)
GFR calc Af Amer: >60 mL/min (ref >60)
GFR calc non Af Amer: >60 mL/min (ref >60)
GLUCOSE: 83 mg/dL (ref 65–99)
POTASSIUM: 3.7 mmol/L (ref 3.5–5.1)
SODIUM: 134 mmol/L — AB (ref 135–145)
Total Bilirubin: 0.6 mg/dL (ref 0.3–1.2)
Total Protein: 7.9 g/dL (ref 6.5–8.1)

## 2015-06-11 LAB — CBC
HCT: 36.6 % (ref 36.0–46.0)
HEMOGLOBIN: 12 g/dL (ref 12.0–15.0)
MCH: 28.1 pg (ref 26.0–34.0)
MCHC: 32.8 g/dL (ref 30.0–36.0)
MCV: 85.7 fL (ref 78.0–100.0)
Platelets: 338 10*3/uL (ref 150–400)
RBC: 4.27 MIL/uL (ref 3.87–5.11)
RDW: 13 % (ref 11.5–15.5)
WBC: 7.5 10*3/uL (ref 4.0–10.5)

## 2015-06-11 LAB — WET PREP, GENITAL
Sperm: NONE SEEN
Trich, Wet Prep: NONE SEEN
YEAST WET PREP: NONE SEEN

## 2015-06-11 LAB — URINALYSIS, ROUTINE W REFLEX MICROSCOPIC
Bilirubin Urine: NEGATIVE
GLUCOSE, UA: NEGATIVE mg/dL
Ketones, ur: NEGATIVE mg/dL
LEUKOCYTES UA: NEGATIVE
Nitrite: NEGATIVE
PH: 6 (ref 5.0–8.0)
PROTEIN: NEGATIVE mg/dL
SPECIFIC GRAVITY, URINE: 1.025 (ref 1.005–1.030)

## 2015-06-11 LAB — AMYLASE: Amylase: 64 U/L (ref 28–100)

## 2015-06-11 LAB — HCG, QUANTITATIVE, PREGNANCY: HCG, BETA CHAIN, QUANT, S: 12 m[IU]/mL — AB (ref <5)

## 2015-06-11 LAB — URINE MICROSCOPIC-ADD ON: WBC, UA: NONE SEEN WBC/hpf (ref 0–5)

## 2015-06-11 LAB — LIPASE, BLOOD: Lipase: 28 U/L (ref 11–51)

## 2015-06-11 NOTE — MAU Note (Addendum)
Pos HPT x 3, has had mass on L breast for the last year, aches sometimes.  Feels like she has bloating in RUQ since she found out she was pregnant, also pain on R side.  Denies bleeding.

## 2015-06-11 NOTE — MAU Provider Note (Signed)
History     CSN: [id number]  Arrival date and time: 06/11/15 1122   None     Chief Complaint  Patient presents with  . Breast Mass  pt is pregnant with 3 positive home pregnancy tests complaining of right sided abdominal pain Pt also c/o of left breast mass- noticed one year ago- was small at that time but has gradually gotten larger- is tender at times;  Pt stopped breasting one month ago Review of records indicates that pt was set up with [facility] previously and breast ultrasound scheduled- discussed with pt and pt states she got scared and also thought maybe it would go away. Abdominal Pain This is a new problem. The current episode started 1 to 4 weeks ago. The onset quality is gradual. The problem occurs intermittently. The problem has been waxing and waning. The pain is located in the LUQ and right flank. The pain is at a severity of 5/10. The quality of the pain is dull. The abdominal pain radiates to the right flank. Associated symptoms include belching and nausea. Pertinent negatives include no constipation, diarrhea, dysuria, fever, headaches or vomiting. Her past medical history is significant for abdominal surgery.  pt has hx of cholecystectomy and has not had issues since surgery 2000 Pt has degenerative disc disease; pt has increase gas and also sx worse after she eats.  Pt also has SOB when she walks. Pt's pain is 5/10 at its worst- position change helps- when lies on right side as well as heat Pt has had 3 positive HPT with 1st one 9 days ago. Pt denies spotting or bleeding  RN note:    Expand All Collapse All   Pos HPT x 3, has had mass on L breast for the last year, aches sometimes. Feels like she has bloating in RUQ since she found out she was pregnant, also pain on R side. Denies bleeding.         Past Medical History  Diagnosis Date  . Spina bifida ([place])   . Chlamydia   . Blood transfusion without reported diagnosis 2009    after postpartum hemorrhage  .  Postpartum hemorrhage 2009    28 week fetal demise    Past Surgical History  Procedure Laterality Date  . Wisdom tooth extraction    . Induced abortion    . Dilation and curettage of uterus    . Cesarean section N/A 01/22/2013    Procedure: primary CESAREAN SECTION of baby boy at 2001;  Surgeon: Cheri Fowler, MD;  Location: [facility];  Service: Obstetrics;  Laterality: N/A;    Family History  Problem Relation Age of Onset  . Diabetes Maternal Grandmother     Social History  Substance Use Topics  . Smoking status: Never Smoker   . Smokeless tobacco: Never Used  . Alcohol Use: No    Allergies: No Known Allergies  Prescriptions prior to admission  Medication Sig Dispense Refill Last Dose  . Prenatal Vit-Fe Fumarate-FA (PRENATAL MULTIVITAMIN) TABS Take 1 tablet by mouth daily.   06/09/2015  . [DISCONTINUED] ibuprofen (ADVIL,MOTRIN) 600 MG tablet Take 1 tablet (600 mg total) by mouth every 6 (six) hours. (Patient not taking: Reported on 06/11/2015) 30 tablet 0   . [DISCONTINUED] oxyCODONE-acetaminophen (PERCOCET/ROXICET) 5-325 MG per tablet Take 1-2 tablets by mouth every 4 (four) hours as needed. (Patient not taking: Reported on 06/11/2015) 30 tablet 0     Review of Systems  Constitutional: Negative for fever and chills.  Cardiovascular: Negative for chest  pain.  Gastrointestinal: Positive for nausea and abdominal pain. Negative for vomiting, diarrhea and constipation.  Genitourinary: Negative for dysuria and urgency.  Musculoskeletal: Positive for back pain.  Neurological: Negative for headaches.   Physical Exam   Blood pressure 128/84, pulse 85, temperature 98.1 F (36.7 C), temperature source Oral, resp. rate 16, last menstrual period 05/12/2015, unknown if currently breastfeeding.  Physical Exam  Nursing note and vitals reviewed. Constitutional: She is oriented to person, place, and time. She appears well-developed and well-nourished. No distress.  HENT:  Head:  Normocephalic.  Eyes: Pupils are equal, round, and reactive to light.  Neck: Normal range of motion. Neck supple.  Cardiovascular: Normal rate.   Respiratory: Effort normal.  Left breast mass- golf ball size left upper quadrant- mildlly tender- firm   GI: Soft. She exhibits no distension and no mass. There is tenderness. There is no rebound and no guarding.  Genitourinary:  Small amount of white creamy discharge in vault; cervix parous clean, uterus retroverted NSSC NT; adnexa without palpable enlargement or tenderness  Musculoskeletal: Normal range of motion.  Neurological: She is alert and oriented to person, place, and time.  Skin: Skin is warm and dry.  Psychiatric: She has a normal mood and affect.    MAU Course  Procedures Results for orders placed or performed during the hospital encounter of 06/11/15 (from the past 24 hour(s))  Urinalysis, Routine w reflex microscopic (not at [facility])     Status: Abnormal   Collection Time: 06/11/15 11:45 AM  Result Value Ref Range   Color, Urine YELLOW YELLOW   APPearance CLEAR CLEAR   Specific Gravity, Urine 1.025 1.005 - 1.030   pH 6.0 5.0 - 8.0   Glucose, UA NEGATIVE NEGATIVE mg/dL   Hgb urine dipstick SMALL (A) NEGATIVE   Bilirubin Urine NEGATIVE NEGATIVE   Ketones, ur NEGATIVE NEGATIVE mg/dL   Protein, ur NEGATIVE NEGATIVE mg/dL   Nitrite NEGATIVE NEGATIVE   Leukocytes, UA NEGATIVE NEGATIVE  Urine microscopic-add on     Status: Abnormal   Collection Time: 06/11/15 11:45 AM  Result Value Ref Range   Squamous Epithelial / LPF 0-5 (A) NONE SEEN   WBC, UA NONE SEEN 0 - 5 WBC/hpf   RBC / HPF 0-5 0 - 5 RBC/hpf   Bacteria, UA RARE (A) NONE SEEN  Wet prep, genital     Status: Abnormal   Collection Time: 06/11/15  2:37 PM  Result Value Ref Range   Yeast Wet Prep HPF POC NONE SEEN NONE SEEN   Trich, Wet Prep NONE SEEN NONE SEEN   Clue Cells Wet Prep HPF POC PRESENT (A) NONE SEEN   WBC, Wet Prep HPF POC FEW (A) NONE SEEN   Sperm NONE  SEEN   CBC     Status: None   Collection Time: 06/11/15  2:50 PM  Result Value Ref Range   WBC 7.5 4.0 - 10.5 K/uL   RBC 4.27 3.87 - 5.11 MIL/uL   Hemoglobin 12.0 12.0 - 15.0 g/dL   HCT 36.6 36.0 - 46.0 %   MCV 85.7 78.0 - 100.0 fL   MCH 28.1 26.0 - 34.0 pg   MCHC 32.8 30.0 - 36.0 g/dL   RDW 13.0 11.5 - 15.5 %   Platelets 338 150 - 400 K/uL  Comprehensive metabolic panel     Status: Abnormal   Collection Time: 06/11/15  2:50 PM  Result Value Ref Range   Sodium 134 (L) 135 - 145 mmol/L   Potassium 3.7 3.5 -  5.1 mmol/L   Chloride 100 (L) 101 - 111 mmol/L   CO2 23 22 - 32 mmol/L   Glucose, Bld 83 65 - 99 mg/dL   BUN 15 6 - 20 mg/dL   Creatinine, Ser 0.81 0.44 - 1.00 mg/dL   Calcium 9.0 8.9 - 10.3 mg/dL   Total Protein 7.9 6.5 - 8.1 g/dL   Albumin 3.9 3.5 - 5.0 g/dL   AST 272 (H) 15 - 41 U/L   ALT 163 (H) 14 - 54 U/L   Alkaline Phosphatase 316 (H) 38 - 126 U/L   Total Bilirubin 0.6 0.3 - 1.2 mg/dL   GFR calc non Af Amer >60 >60 mL/min   GFR calc Af Amer >60 >60 mL/min   Anion gap 11 5 - 15  Amylase     Status: None   Collection Time: 06/11/15  2:50 PM  Result Value Ref Range   Amylase 64 28 - 100 U/L  Lipase, blood     Status: None   Collection Time: 06/11/15  2:50 PM  Result Value Ref Range   Lipase 28 11 - 51 U/L  hCG, quantitative, pregnancy     Status: Abnormal   Collection Time: 06/11/15  2:50 PM  Result Value Ref Range   hCG, Beta Chain, Quant, S 12 (H) <5 mIU/mL  US Abdomen Complete  06/11/2015  CLINICAL DATA:  Right upper quadrant pain EXAM: ULTRASOUND ABDOMEN COMPLETE COMPARISON:  None. FINDINGS: Gallbladder: Surgically absent Common bile duct: Diameter: 1.3 mm in diameter within normal limits Liver: Hepatomegaly. There is heterogeneous liver with nodular appearance. Infiltrating liver disease cannot be excluded. Clinical correlation and further evaluation is recommended. IVC: No abnormality visualized. Pancreas: Visualized portion unremarkable. Spleen: Size and  appearance within normal limits. Measures 2.8 cm in length Right Kidney: Length: 10.2 cm. Echogenicity within normal limits. No mass or hydronephrosis visualized. Left Kidney: Length: 11 cm. Echogenicity within normal limits. No mass or hydronephrosis visualized. Abdominal aorta: No aneurysm visualized. Measures up to 2 cm in diameter. Other findings: None. IMPRESSION: 1. Surgically absent gallbladder.  Normal CBD. 2. There is hepatomegaly. Heterogeneous increased echogenicity of the liver with heterogeneous nodular appearance. Further evaluation with enhanced CT or preferably MRI is recommended to exclude infiltrating liver disease. No hydronephrosis. No aortic aneurysm. Electronically Signed   By: Lahoma Crocker M.D.   On: 06/11/2015 16:34   US Ob Comp Less 14 Wks  06/11/2015  CLINICAL DATA:  Right pelvic and back pain. Gestational age by LMP of 4 weeks 2 days. EXAM: OBSTETRIC <14 WK [place] AND TRANSVAGINAL OB US TECHNIQUE: Both transabdominal and transvaginal ultrasound examinations were performed for complete evaluation of the gestation as well as the maternal uterus, adnexal regions, and pelvic cul-de-sac. Transvaginal technique was performed to assess early pregnancy. COMPARISON:  None. FINDINGS: No intrauterine gestational sac or other fluid collection visualized in endometrial cavity. Uterus is retroverted. No fibroids identified. Right ovary contains a small corpus luteum. 2 cm simple appearing right paraovarian cyst also noted. Left ovary is normal in appearance. No suspicious-appearing adnexal mass identified. Tiny amount of simple free fluid noted. IMPRESSION: Pregnancy location not visualized sonographically. Differential diagnosis includes recent spontaneous abortion, IUP too early to visualize, and non-visualized ectopic pregnancy. Recommend close follow up of quantitative B-HCG levels, and follow up US as clinically warranted. Electronically Signed   By: [name] M.D.   On: 06/11/2015 16:34   US Ob  Transvaginal  06/11/2015  CLINICAL DATA:  Right pelvic and back pain. Gestational  age by LMP of 4 weeks 2 days. EXAM: OBSTETRIC <14 WK [place] AND TRANSVAGINAL OB US TECHNIQUE: Both transabdominal and transvaginal ultrasound examinations were performed for complete evaluation of the gestation as well as the maternal uterus, adnexal regions, and pelvic cul-de-sac. Transvaginal technique was performed to assess early pregnancy. COMPARISON:  None. FINDINGS: No intrauterine gestational sac or other fluid collection visualized in endometrial cavity. Uterus is retroverted. No fibroids identified. Right ovary contains a small corpus luteum. 2 cm simple appearing right paraovarian cyst also noted. Left ovary is normal in appearance. No suspicious-appearing adnexal mass identified. Tiny amount of simple free fluid noted. IMPRESSION: Pregnancy location not visualized sonographically. Differential diagnosis includes recent spontaneous abortion, IUP too early to visualize, and non-visualized ectopic pregnancy. Recommend close follow up of quantitative B-HCG levels, and follow up US as clinically warranted. Electronically Signed   By: [name] M.D.   On: 06/11/2015 16:34    Assessment and Plan  Breast mass- note sent to Sabrina Holland/[facility] to contact pt for evaluation hepatomegaly with elevated liver enzymes- hepatitis panel pending- pt to call [facility] Pregnancy of unknown location- follow up in 48 hours for repeat HCG Thurs Dec 1    Tynisa Vohs 06/11/2015, 2:03 PM

## 2015-06-12 LAB — HEPATITIS PANEL, ACUTE
HCV Ab: 0.1 s/co ratio (ref 0.0–0.9)
HEP B C IGM: NEGATIVE
Hep A IgM: NEGATIVE
Hepatitis B Surface Ag: NEGATIVE

## 2015-06-12 LAB — GC/CHLAMYDIA PROBE AMP (~~LOC~~) NOT AT ARMC
Chlamydia: NEGATIVE
NEISSERIA GONORRHEA: NEGATIVE

## 2015-06-12 LAB — HIV ANTIBODY (ROUTINE TESTING W REFLEX): HIV SCREEN 4TH GENERATION: NONREACTIVE

## 2015-06-13 ENCOUNTER — Inpatient Hospital Stay (HOSPITAL_COMMUNITY)
Admission: AD | Admit: 2015-06-13 | Discharge: 2015-06-13 | Disposition: A | Discharge status: Home / Self Care | Payer: Medicaid Other | Source: Ambulatory Visit | Attending: Obstetrics & Gynecology | Admitting: Obstetrics & Gynecology

## 2015-06-13 ENCOUNTER — Encounter (HOSPITAL_COMMUNITY): Payer: Self-pay | Admitting: *Deleted

## 2015-06-13 DIAGNOSIS — O0281 Inappropriate change in quantitative human chorionic gonadotropin (hCG) in early pregnancy: Secondary | ICD-10-CM | POA: Diagnosis not present

## 2015-06-13 DIAGNOSIS — Z3A01 Less than 8 weeks gestation of pregnancy: Secondary | ICD-10-CM | POA: Diagnosis not present

## 2015-06-13 DIAGNOSIS — O3680X0 Pregnancy with inconclusive fetal viability, not applicable or unspecified: Secondary | ICD-10-CM

## 2015-06-13 LAB — HCG, QUANTITATIVE, PREGNANCY: HCG, BETA CHAIN, QUANT, S: 12 m[IU]/mL — AB (ref <5)

## 2015-06-13 NOTE — MAU Provider Note (Signed)
S:  Ms. Dawn Haynes is a 34 y.o. female [phone] at [redacted]w[redacted]d presenting to MAU for a follow up beta hcg level.  She was seen 2 days ago with abdominal pain; following a positive home pregnancy test. Quant 2 days ago was 12, nothing was seen on OB transvaginal US.  The patient denies pain today She tarted having some light vaginal bleeding this morning, the bleeding is similar to her menstrual cycle.     O:  GENERAL: Well-developed, well-nourished female in no acute distress.  LUNGS: Effort normal SKIN: Warm, dry and without erythema ABDOMEN: soft, non-tender, negative rebound PSYCH: Normal mood and affect  Filed Vitals:   06/13/15 0808  BP: 126/84  Pulse: 99  Temp: 98.2 F (36.8 C)  Resp: 18    Results for orders placed or performed during the hospital encounter of 06/13/15 (from the past 48 hour(s))  hCG, quantitative, pregnancy     Status: Abnormal   Collection Time: 06/13/15  8:11 AM  Result Value Ref Range   hCG, Beta Chain, Quant, S 12 (H) <5 mIU/mL    Comment:          GEST. AGE      CONC.  (mIU/mL)   <=1 WEEK        5 - 50     2 WEEKS       50 - 500     3 WEEKS       100 - 10,000     4 WEEKS     1,000 - 30,000     5 WEEKS     3,500 - 115,000   6-8 WEEKS     12,000 - 270,000    12 WEEKS     15,000 - 220,000        FEMALE AND NON-PREGNANT FEMALE:     LESS THAN 5 mIU/mL    MDM:  Quant on 11/29: 12 Quant on 12/1: 12 Discussed quants with Dr. Gala Romney. Patient started bleeding this morning similar to a period. Patient is advised to returned to MAU in 48 hours for repeat quant. Likely SAB in process, although cannot rule out ectopic.    A:  1. Pregnancy of unknown anatomic location   2. Inappropriate change in quantitative hCG in early pregnancy    P:  Discharge home in stable condition  Strict ectopic precautions Return in 48 hours for additional quant.  Likely SAB, although cannot rule out ectopic pregnancy  Pelvic rest.   Lezlie Lye, NP 06/13/2015  9:15 AM

## 2015-06-13 NOTE — MAU Note (Signed)
Pt presents to MAU for follow up Quant. Pt states she started bleeding this morning, denies any pain

## 2015-06-14 ENCOUNTER — Telehealth (HOSPITAL_COMMUNITY): Payer: Self-pay | Admitting: *Deleted

## 2015-06-14 NOTE — Telephone Encounter (Signed)
Telephoned patient at home # to schedule appointment with [facility]. Patient stated has pregnancy [employer] and appointment has been scheduled at the [facility] for next Wednesday. Advised patient if she does not have insurance she could call [facility] back to be scheduled. Patient voiced understanding.

## 2015-06-15 ENCOUNTER — Inpatient Hospital Stay (HOSPITAL_COMMUNITY)
Admission: AD | Admit: 2015-06-15 | Discharge: 2015-06-15 | Disposition: A | Discharge status: Home / Self Care | Payer: Medicaid Other | Source: Ambulatory Visit | Attending: Obstetrics & Gynecology | Admitting: Obstetrics & Gynecology

## 2015-06-15 DIAGNOSIS — O209 Hemorrhage in early pregnancy, unspecified: Secondary | ICD-10-CM | POA: Diagnosis not present

## 2015-06-15 DIAGNOSIS — O0281 Inappropriate change in quantitative human chorionic gonadotropin (hCG) in early pregnancy: Secondary | ICD-10-CM | POA: Insufficient documentation

## 2015-06-15 DIAGNOSIS — Z3A01 Less than 8 weeks gestation of pregnancy: Secondary | ICD-10-CM | POA: Insufficient documentation

## 2015-06-15 DIAGNOSIS — R748 Abnormal levels of other serum enzymes: Secondary | ICD-10-CM | POA: Diagnosis not present

## 2015-06-15 DIAGNOSIS — O4690 Antepartum hemorrhage, unspecified, unspecified trimester: Secondary | ICD-10-CM

## 2015-06-15 LAB — HCG, QUANTITATIVE, PREGNANCY: HCG, BETA CHAIN, QUANT, S: 13 m[IU]/mL — AB (ref <5)

## 2015-06-15 NOTE — MAU Note (Signed)
feeling fine. Is bleeding, changing every 4 hours, passed 2 clots.

## 2015-06-15 NOTE — Discharge Instructions (Signed)
Human Chorionic Gonadotropin Test Human chorionic gonadotropin (hCG) is a hormone produced during pregnancy by the cells that form the placenta. The placenta is the organ that grows inside your womb (uterus) to nourish a developing baby. When you are pregnant, hCG starts to appear in your blood about 11 days after conception. It continues to go up for the first 8-11 weeks of pregnancy.  Your hCG level can be measured with several different types of tests. You may have:  A urine test.  hCG is eliminated from your body by your kidneys, so a urine test is one way to check for this hormone.  A urine test only shows whether there is hCG in your urine. It does not measure how much.  You may have a urine test to find out whether you are pregnant.  A home pregnancy test detects whether there is hCG in your urine.  A qualitative blood test.  Like the urine test, this blood test only shows whether there is hCG in your blood. It does not measure how much.  You may have this type of blood test to find out whether you are pregnant.  A quantitative blood test.  This type of blood test measures the amount of hCG in your blood.  You may have this type of test to diagnose an abnormal pregnancy or determine whether you are at risk of, or have had, a failed pregnancy (miscarriage). PREPARATION FOR TEST For the urine test:  Limit your fluid intake before the urine test as directed by your health care provider.  Collect the sample the first time you urinate in the morning.  Let your health care provider know if you have blood in your urine. This may interfere with the test result. Some medicines may interfere with the urine and blood tests. Let your health care provider know about all the medicines you are taking. No additional preparation is required for the blood test.  RESULTS It is your responsibility to obtain your test results. Ask the lab or department performing the test when and how you will  get your results. Talk to your health care provider if you have any questions about your test results. The results of the hCG urine test and the qualitative hCG blood test are either positive or negative. The results of the quantitative hCG blood test are reported as a number. hCG is measured in international units per liter (IU/L). Meaning of Negative Test Results A negative result on a urine or qualitative blood test could mean that you are not pregnant. It could also mean the test was done too early to detect hCG. If you still have other signs of pregnancy, the test should be repeated. Meaning of Positive Test Results A positive result on the urine or qualitative blood tests means you are most likely pregnant. Your health care provider may confirm your pregnancy with an imaging study of the inside of your uterus at 5-6 weeks (ultrasound).  Range of Normal Values Ranges for normal values for the quantitative hCG blood test may vary among different labs and hospitals. You should always check with your health care provider after having lab work or other tests done to discuss whether your values are considered within normal limits.   Less than 5 IU/L means it is most likely you are not pregnant.  Greater than 25 IU/L means it is most likely you are pregnant. Meaning of Results Outside Normal Value Ranges If your hCG level on the quantitative test is not  what would be expected, you may have the test again. It may also be important for your health care provider to know whether your hCG level goes up or down over time. Common causes of results outside the normal range include:  °· Being pregnant with twins (hCG level is higher than expected). °· Having an ectopic pregnancy (hCG rises more slowly than expected). °· Miscarriage (hCG level falls). °· Abnormal growths in the womb (hCG level is higher than expected). °  °This information is not intended to replace advice given to you by your health care  provider. Make sure you discuss any questions you have with your health care provider. °  °Document Released: 07/31/2004 Document Revised: 07/20/2014 Document Reviewed: 10/03/2013 °Elsevier Interactive Patient Education ©2016 [employer] ° °

## 2015-06-15 NOTE — MAU Provider Note (Signed)
History   Chief Complaint:  Follow-up   Dawn Haynes is  34 y.o. ID:[id number] Patient's last menstrual period was 05/12/2015.Marland Kitchen Patient is here for follow up of quantitative HCG and ongoing surveillance of pregnancy status.   She is [redacted]w[redacted]d weeks gestation  by LMP.  At her first visit to maternity admissions for this problem on 06/11/2015 she was also diagnosed with a breast mass, elevated liver enzymes and abnormal findings on liver ultrasound. She has been referred to [facility].   Since her last visit, the patient is without new complaint.     ROS Abdomin Pain: None Vaginal bleeding: lighter than period.   Passage of clots or tissue: Passed 2 small clots Dizziness: None  Her previous Quantitative HCG values are:  Results for Dawn, Haynes (MRN [id number]) as of 06/17/2015 04:17  Ref. Range 06/11/2015 14:50 06/13/2015 08:11  HCG, Beta Chain, Quant, S Latest Ref Range: <5 mIU/mL 12 (H) 12 (H)    Physical Exam   BP 121/78 mmHg  Pulse 86  Temp(Src) 98.2 F (36.8 C) (Oral)  Resp 16  LMP 05/12/2015 Constitutional: Well-nourished female in no apparent distress. No pallor Neuro: Alert and oriented 4 Cardiovascular: Normal rate Respiratory: Normal effort and rate Gynecological Exam: examination not indicated  Labs: Results for orders placed or performed during the hospital encounter of 06/15/15 (from the past 24 hour(s))  hCG, quantitative, pregnancy   Collection Time: 06/15/15 12:05 PM  Result Value Ref Range   hCG, Beta Chain, Quant, S 13 (H) <5 mIU/mL    Ultrasound Studies:   US Abdomen Complete  06/11/2015  CLINICAL DATA:  Right upper quadrant pain EXAM: ULTRASOUND ABDOMEN COMPLETE COMPARISON:  None. FINDINGS: Gallbladder: Surgically absent Common bile duct: Diameter: 1.3 mm in diameter within normal limits Liver: Hepatomegaly. There is heterogeneous liver with nodular appearance. Infiltrating liver disease cannot be excluded. Clinical correlation  and further evaluation is recommended. IVC: No abnormality visualized. Pancreas: Visualized portion unremarkable. Spleen: Size and appearance within normal limits. Measures 2.8 cm in length Right Kidney: Length: 10.2 cm. Echogenicity within normal limits. No mass or hydronephrosis visualized. Left Kidney: Length: 11 cm. Echogenicity within normal limits. No mass or hydronephrosis visualized. Abdominal aorta: No aneurysm visualized. Measures up to 2 cm in diameter. Other findings: None. IMPRESSION: 1. Surgically absent gallbladder.  Normal CBD. 2. There is hepatomegaly. Heterogeneous increased echogenicity of the liver with heterogeneous nodular appearance. Further evaluation with enhanced CT or preferably MRI is recommended to exclude infiltrating liver disease. No hydronephrosis. No aortic aneurysm. Electronically Signed   By: Lahoma Crocker M.D.   On: 06/11/2015 16:34   US Ob Comp Less 14 Wks  06/11/2015  CLINICAL DATA:  Right pelvic and back pain. Gestational age by LMP of 4 weeks 2 days. EXAM: OBSTETRIC <14 WK [place] AND TRANSVAGINAL OB US TECHNIQUE: Both transabdominal and transvaginal ultrasound examinations were performed for complete evaluation of the gestation as well as the maternal uterus, adnexal regions, and pelvic cul-de-sac. Transvaginal technique was performed to assess early pregnancy. COMPARISON:  None. FINDINGS: No intrauterine gestational sac or other fluid collection visualized in endometrial cavity. Uterus is retroverted. No fibroids identified. Right ovary contains a small corpus luteum. 2 cm simple appearing right paraovarian cyst also noted. Left ovary is normal in appearance. No suspicious-appearing adnexal mass identified. Tiny amount of simple free fluid noted. IMPRESSION: Pregnancy location not visualized sonographically. Differential diagnosis includes recent spontaneous abortion, IUP too early to visualize, and non-visualized ectopic pregnancy. Recommend close  follow up of quantitative  B-HCG levels, and follow up US as clinically warranted. Electronically Signed   By: [name] M.D.   On: 06/11/2015 16:34   US Ob Transvaginal  06/11/2015  CLINICAL DATA:  Right pelvic and back pain. Gestational age by LMP of 4 weeks 2 days. EXAM: OBSTETRIC <14 WK [place] AND TRANSVAGINAL OB US TECHNIQUE: Both transabdominal and transvaginal ultrasound examinations were performed for complete evaluation of the gestation as well as the maternal uterus, adnexal regions, and pelvic cul-de-sac. Transvaginal technique was performed to assess early pregnancy. COMPARISON:  None. FINDINGS: No intrauterine gestational sac or other fluid collection visualized in endometrial cavity. Uterus is retroverted. No fibroids identified. Right ovary contains a small corpus luteum. 2 cm simple appearing right paraovarian cyst also noted. Left ovary is normal in appearance. No suspicious-appearing adnexal mass identified. Tiny amount of simple free fluid noted. IMPRESSION: Pregnancy location not visualized sonographically. Differential diagnosis includes recent spontaneous abortion, IUP too early to visualize, and non-visualized ectopic pregnancy. Recommend close follow up of quantitative B-HCG levels, and follow up US as clinically warranted. Electronically Signed   By: [name] M.D.   On: 06/11/2015 16:34    MAU course/MDM: Quantitative hCG ordered  Pain and bleeding in early pregnancy with abnormal rise in Quant, but hemodynamically stable. Discussed quants with Dr. Harolyn Rutherford. Although Quants are clearly abnormal and ectopic pregnancy has not been excluded patient is not a candidate for methotrexate therapy due to elevated liver enzymes and has a very low likelihood tubal rupture due to low Quants.   In light of the findings of breast mass, elevated liver enzymes and abnormal findings on liver ultrasound, other causes of elevated hCG are being considered.  Assessment: [redacted]w[redacted]d weeks gestation w/ abnormal rise in  Quant Pregnancy of unknown anatomic location 1. Inappropriate change in quantitative hCG in early pregnancy   2. Elevated liver enzymes   3. Vaginal bleeding during pregnancy, antepartum     Plan: Discharge home in stable condition. SAB and ectopic precautions Per Dr. Harolyn Rutherford will add hCG tumor marker when the patient has blood drawn in clinic on 06/18/2015. Follow-up with breast center 06/19/2015 for breast ultrasound..     Follow-up Information    Follow up with [facility] On 06/18/2015.   Specialty:  Obstetrics and Gynecology   Why:  For repeat blood work   Contact information:   [place] [place] [phone]      Follow up with [facility].   Why:  As needed in emergencies   Contact information:   [address] [id number] [place] [phone]       Medication List    TAKE these medications        prenatal multivitamin Tabs tablet  Take 1 tablet by mouth daily.        Manya Silvas, [place] 06/15/2015, 1:31 PM  2/3

## 2015-06-17 ENCOUNTER — Telehealth: Payer: Self-pay | Admitting: General Practice

## 2015-06-17 ENCOUNTER — Telehealth (HOSPITAL_COMMUNITY): Payer: Self-pay | Admitting: *Deleted

## 2015-06-17 NOTE — Telephone Encounter (Signed)
Called patient regarding need to come in for bhcg on 12/6. Also, patient needs to get in contact with [facility] for up coming ultrasound of breast mass. Called patient and she confirmed she is coming in tomorrow morning for blood work. Patient also states she called Sabrina on Friday & is waiting to hear back from her. Patient had no questions

## 2015-06-17 NOTE — Telephone Encounter (Signed)
Patient called stating has appointment with [facility] on Wed. Patient states has pregnancy Medicaid. Advised patient would not be eligible for [facility] due to having Medicaid. Patient voiced understanding and will call back if she doesn't have insurance.

## 2015-06-18 ENCOUNTER — Other Ambulatory Visit: Payer: Self-pay

## 2015-06-18 DIAGNOSIS — N63 Unspecified lump in unspecified breast: Secondary | ICD-10-CM

## 2015-06-18 DIAGNOSIS — Z3200 Encounter for pregnancy test, result unknown: Secondary | ICD-10-CM

## 2015-06-19 ENCOUNTER — Other Ambulatory Visit: Payer: Medicaid Other

## 2015-06-19 LAB — HCG, QUANTITATIVE, PREGNANCY

## 2015-06-21 LAB — BETA HCG QUANT (REF LAB): Beta hCG, Tumor Marker: 15.4 m[IU]/mL — ABNORMAL HIGH (ref <5.0)

## 2015-06-25 ENCOUNTER — Encounter (HOSPITAL_COMMUNITY): Payer: Self-pay | Admitting: Neurology

## 2015-06-25 ENCOUNTER — Emergency Department (HOSPITAL_COMMUNITY)
Admission: EM | Admit: 2015-06-25 | Discharge: 2015-06-25 | Disposition: A | Discharge status: Home / Self Care | Payer: Medicaid Other | Attending: Emergency Medicine | Admitting: Emergency Medicine

## 2015-06-25 DIAGNOSIS — O9989 Other specified diseases and conditions complicating pregnancy, childbirth and the puerperium: Secondary | ICD-10-CM | POA: Diagnosis present

## 2015-06-25 DIAGNOSIS — R1011 Right upper quadrant pain: Secondary | ICD-10-CM | POA: Insufficient documentation

## 2015-06-25 DIAGNOSIS — Z8619 Personal history of other infectious and parasitic diseases: Secondary | ICD-10-CM | POA: Diagnosis not present

## 2015-06-25 DIAGNOSIS — R531 Weakness: Secondary | ICD-10-CM | POA: Diagnosis not present

## 2015-06-25 DIAGNOSIS — Z79899 Other long term (current) drug therapy: Secondary | ICD-10-CM | POA: Insufficient documentation

## 2015-06-25 LAB — COMPREHENSIVE METABOLIC PANEL
ALT: 162 U/L — AB (ref 14–54)
AST: 235 U/L — AB (ref 15–41)
Albumin: 3.5 g/dL (ref 3.5–5.0)
Alkaline Phosphatase: 348 U/L — ABNORMAL HIGH (ref 38–126)
Anion gap: 16 — ABNORMAL HIGH (ref 5–15)
BUN: 10 mg/dL (ref 6–20)
CHLORIDE: 97 mmol/L — AB (ref 101–111)
CO2: 23 mmol/L (ref 22–32)
CREATININE: 0.92 mg/dL (ref 0.44–1.00)
Calcium: 9.6 mg/dL (ref 8.9–10.3)
Glucose, Bld: 120 mg/dL — ABNORMAL HIGH (ref 65–99)
POTASSIUM: 4 mmol/L (ref 3.5–5.1)
SODIUM: 136 mmol/L (ref 135–145)
Total Bilirubin: 0.8 mg/dL (ref 0.3–1.2)
Total Protein: 7.3 g/dL (ref 6.5–8.1)

## 2015-06-25 LAB — CBC
HEMATOCRIT: 40.2 % (ref 36.0–46.0)
Hemoglobin: 12.8 g/dL (ref 12.0–15.0)
MCH: 27.6 pg (ref 26.0–34.0)
MCHC: 31.8 g/dL (ref 30.0–36.0)
MCV: 86.6 fL (ref 78.0–100.0)
PLATELETS: 393 10*3/uL (ref 150–400)
RBC: 4.64 MIL/uL (ref 3.87–5.11)
RDW: 13.4 % (ref 11.5–15.5)
WBC: 8.1 10*3/uL (ref 4.0–10.5)

## 2015-06-25 LAB — I-STAT BETA HCG BLOOD, ED (MC, WL, AP ONLY): I-stat hCG, quantitative: 11 m[IU]/mL — ABNORMAL HIGH (ref <5)

## 2015-06-25 LAB — LIPASE, BLOOD: LIPASE: 19 U/L (ref 11–51)

## 2015-06-25 MED ORDER — ONDANSETRON HCL 4 MG PO TABS: 4.0000 mg | ORAL_TABLET | Freq: Four times a day (QID) | ORAL | Status: DC

## 2015-06-25 MED ORDER — SODIUM CHLORIDE 0.9 % IV BOLUS (SEPSIS): 1000.0000 mL | Freq: Once | INTRAVENOUS | Status: DC

## 2015-06-25 MED ORDER — ONDANSETRON HCL 4 MG/2ML IJ SOLN
4.0000 mg | Freq: Once | INTRAMUSCULAR | Status: DC
  Filled 2015-06-25: qty 2

## 2015-06-25 MED ORDER — ONDANSETRON 4 MG PO TBDP
4.0000 mg | ORAL_TABLET | Freq: Once | ORAL | Status: AC
  Administered 2015-06-25: 4 mg via ORAL
  Filled 2015-06-25: qty 1

## 2015-06-25 NOTE — Discharge Instructions (Signed)
Ms. Dawn Haynes,  Nice meeting you! [facility]. I am referring you to gastroenterology for your liver management. Return to the emergency department if you are unable to keep foods down, have increasing weakness or pain. Feel better soon!  S. Wendie Simmer, PA-C

## 2015-06-25 NOTE — ED Notes (Addendum)
Pt reports 2 weeks ago went to [facility] had positive hcg level, US showed enlarged liver and mass in her breast. Is here c/o RUQ pain. [facility] told her the HCG levels could be coming from her mass in her left breast. They are unsure if she is pregnant, and she hasn't received any results. Has been feeling weak, tired, and nauseated. Pt tried to get mammogram but wouldn't see her because medacaid wasn't active yet. She had an [place] but they cannot find the location of pregnancy. Denies vaginal bleeding. LMP was 05/12/15, then bleeding started 12/6.

## 2015-06-25 NOTE — ED Provider Notes (Signed)
CSN: [id number]     Arrival date & time 06/25/15  1017 History   First MD Initiated Contact with Patient 06/25/15 1120     Chief Complaint: Weakness  HPI   Dawn Haynes is a 34 y.o. F PMH significant for spina bifida presenting with weakness, nausea, and RUQ pain. She describes her pain as chronic, achy, intermittent, 5/10 pain scale. She went to [facility] 2 weeks ago with complaints of a left breast mass, this RUQ pain, had a positive hCG level, and an ultrasound revealed an enlarged liver. She has not had an ultrasound of her breast yet due to insurance issues, but this is scheduled. She was told her increased hCG levels were likely the result of the breast mass but an ultrasound was performed anyway and could not find an intrauterine pregnancy. She denies fevers, chills, emesis, changes in bowel/bladder function.    Past Medical History  Diagnosis Date  . Spina bifida ([place])   . Chlamydia   . Blood transfusion without reported diagnosis 2009    after postpartum hemorrhage  . Postpartum hemorrhage 2009    28 week fetal demise  . DDD (degenerative disc disease), lumbar    Past Surgical History  Procedure Laterality Date  . Wisdom tooth extraction    . Induced abortion    . Dilation and curettage of uterus    . Cesarean section N/A 01/22/2013    Procedure: primary CESAREAN SECTION of baby boy at 2001;  Surgeon: Cheri Fowler, MD;  Location: [facility];  Service: Obstetrics;  Laterality: N/A;  . Cholecystectomy  Aug 2000   Family History  Problem Relation Age of Onset  . Diabetes Maternal Grandmother    Social History  Substance Use Topics  . Smoking status: Never Smoker   . Smokeless tobacco: Never Used  . Alcohol Use: No   OB History    Gravida Para Term Preterm AB TAB SAB Ectopic Multiple Living   15 7 4 3 7 1 6   6      Review of Systems  Ten systems are reviewed and are negative for acute change except as noted in the HPI   Allergies  Review of patient's allergies  indicates no known allergies.  Home Medications   Prior to Admission medications   Medication Sig Start Date End Date Taking? Authorizing Provider  Prenatal Vit-Fe Fumarate-FA (PRENATAL MULTIVITAMIN) TABS Take 1 tablet by mouth daily.    Historical Provider, MD   BP 111/85 mmHg  Pulse 97  Temp(Src) 97.2 F (36.2 C) (Oral)  Resp 14  SpO2 100%  LMP 05/12/2015  Breastfeeding? Unknown Physical Exam  Constitutional: She appears well-developed and well-nourished. No distress.  HENT:  Head: Normocephalic and atraumatic.  Mouth/Throat: Oropharynx is clear and moist. No oropharyngeal exudate.  Eyes: Conjunctivae are normal. Pupils are equal, round, and reactive to light. Right eye exhibits no discharge. Left eye exhibits no discharge. No scleral icterus.  Neck: No tracheal deviation present.  Cardiovascular: Normal rate, regular rhythm, normal heart sounds and intact distal pulses.  Exam reveals no gallop and no friction rub.   No murmur heard. Pulmonary/Chest: Effort normal and breath sounds normal. No respiratory distress. She has no wheezes. She has no rales. She exhibits no tenderness.  Abdominal: Soft. Bowel sounds are normal. She exhibits no distension and no mass. There is tenderness. There is no rebound and no guarding.  Minimal RUQ tenderness  Musculoskeletal: She exhibits no edema.  Lymphadenopathy:    She has no cervical adenopathy.  Neurological: She is alert. Coordination normal.  Skin: Skin is warm and dry. No rash noted. She is not diaphoretic. No erythema.  Psychiatric: She has a normal mood and affect. Her behavior is normal.  Nursing note and vitals reviewed.   ED Course  Procedures  Labs Review Labs Reviewed  COMPREHENSIVE METABOLIC PANEL - Abnormal; Notable for the following:    Chloride 97 (*)    Glucose, Bld 120 (*)    AST 235 (*)    ALT 162 (*)    Alkaline Phosphatase 348 (*)    Anion gap 16 (*)    All other components within normal limits  I-STAT BETA  HCG BLOOD, ED ([facility], [facility], [facility] ONLY) - Abnormal; Notable for the following:    I-stat hCG, quantitative 11.0 (*)    All other components within normal limits  LIPASE, BLOOD  CBC  URINALYSIS, ROUTINE W REFLEX MICROSCOPIC (NOT AT [facility])    MDM   Final diagnoses:  Weakness   Patient non-toxic appearing and VSS. This RUQ pain and mass workup is being followed by [facility]. Will treat nausea and weakness. Labs were ordered prior to my evaluation of patient and revealed HCG at 11 and elevated LFTs, all of which are at baseline. Explained results to patient, and explained her breast and liver workup should be continued by the provider that initiated it unless there are new issues or she does not have followup in place, and she is requesting to leave the ED. I offered to give her fluids and zofran for her nausea and weakness in the ED, but she is declining. Patient may be safely discharged home. Discussed reasons for return. Patient to follow-up with primary care provider within one week. Patient in understanding and agreement with the plan.     [place], [place] 06/27/15 [id number]  Daleen Bo, MD 06/28/15 [phone]

## 2015-07-01 ENCOUNTER — Telehealth: Payer: Self-pay | Admitting: *Deleted

## 2015-07-01 NOTE — Telephone Encounter (Signed)
Pt left message requesting someone to call her and explain her test results.

## 2015-07-02 ENCOUNTER — Ambulatory Visit
Admission: RE | Admit: 2015-07-02 | Discharge: 2015-07-02 | Disposition: A | Discharge status: Home / Self Care | Payer: Medicaid Other | Source: Ambulatory Visit | Attending: Obstetrics and Gynecology | Admitting: Obstetrics and Gynecology

## 2015-07-02 ENCOUNTER — Other Ambulatory Visit: Payer: Self-pay | Admitting: *Deleted

## 2015-07-02 ENCOUNTER — Other Ambulatory Visit (HOSPITAL_COMMUNITY): Payer: Self-pay | Admitting: Obstetrics and Gynecology

## 2015-07-02 DIAGNOSIS — N6320 Unspecified lump in the left breast, unspecified quadrant: Secondary | ICD-10-CM

## 2015-07-02 NOTE — Telephone Encounter (Signed)
Spoke to [place] regarding patient she will review her chart and let me know what steps need to be taken.

## 2015-07-03 NOTE — Telephone Encounter (Signed)
Spoke with patient concerning regarding instruction regarding her follow up care.

## 2015-07-03 NOTE — Telephone Encounter (Signed)
Pt stated she has follow up appt with the breast center next week.

## 2015-07-03 NOTE — Telephone Encounter (Signed)
-----   Message from Seabron Spates, CNM sent at 07/02/2015  5:25 PM EST ----- Regarding: patient f/u Per Dr Roselie Awkward, She is not pregnant.  She needs FIRST to be referred to [facility] for breast mass,  Then she needs to see a medical doctor (any internal medicine) for the abnormal liver tests and enlarged liver.  Does not need OB care  Thanks Lelan Pons

## 2015-07-09 ENCOUNTER — Ambulatory Visit
Admission: RE | Admit: 2015-07-09 | Discharge: 2015-07-09 | Disposition: A | Discharge status: Home / Self Care | Payer: Medicaid Other | Source: Ambulatory Visit | Attending: Obstetrics and Gynecology | Admitting: Obstetrics and Gynecology

## 2015-07-09 ENCOUNTER — Other Ambulatory Visit (HOSPITAL_COMMUNITY): Payer: Self-pay | Admitting: Obstetrics and Gynecology

## 2015-07-09 ENCOUNTER — Ambulatory Visit
Admission: RE | Admit: 2015-07-09 | Discharge: 2015-07-09 | Disposition: A | Discharge status: Home / Self Care | Payer: No Typology Code available for payment source | Source: Ambulatory Visit | Attending: Obstetrics and Gynecology | Admitting: Obstetrics and Gynecology

## 2015-07-09 DIAGNOSIS — N6320 Unspecified lump in the left breast, unspecified quadrant: Secondary | ICD-10-CM

## 2015-07-11 ENCOUNTER — Encounter: Payer: Self-pay | Admitting: *Deleted

## 2015-07-11 ENCOUNTER — Telehealth: Payer: Self-pay | Admitting: *Deleted

## 2015-07-11 DIAGNOSIS — C50412 Malignant neoplasm of upper-outer quadrant of left female breast: Secondary | ICD-10-CM | POA: Insufficient documentation

## 2015-07-11 HISTORY — DX: Malignant neoplasm of upper-outer quadrant of left female breast: C50.412

## 2015-07-11 NOTE — Telephone Encounter (Signed)
Confirmed [facility] for 07/17/15 at 0830 .  Instructions and contact information given.

## 2015-07-12 ENCOUNTER — Telehealth: Payer: Self-pay | Admitting: *Deleted

## 2015-07-12 NOTE — Telephone Encounter (Signed)
Mailed clinic packet to pt.  

## 2015-07-15 ENCOUNTER — Telehealth: Payer: Self-pay

## 2015-07-15 NOTE — Telephone Encounter (Signed)
Results have been discuss with patient.  

## 2015-07-15 NOTE — Telephone Encounter (Signed)
-----   Message from Seabron Spates, CNM sent at 07/02/2015  5:25 PM EST ----- Regarding: patient f/u Per Dr Roselie Awkward, She is not pregnant.  She needs FIRST to be referred to [facility] for breast mass,  Then she needs to see a medical doctor (any internal medicine) for the abnormal liver tests and enlarged liver.  Does not need OB care  Thanks Lelan Pons

## 2015-07-17 ENCOUNTER — Emergency Department (HOSPITAL_COMMUNITY): Payer: Medicaid Other

## 2015-07-17 ENCOUNTER — Inpatient Hospital Stay (HOSPITAL_COMMUNITY)
Admission: EM | Admit: 2015-07-17 | Discharge: 2015-07-29 | DRG: 872 | Disposition: A | Discharge status: Home Health | Payer: Medicaid Other | Source: Ambulatory Visit | Attending: Internal Medicine | Admitting: Internal Medicine

## 2015-07-17 ENCOUNTER — Ambulatory Visit: Payer: Medicaid Other | Admitting: Physical Therapy

## 2015-07-17 ENCOUNTER — Encounter: Payer: Self-pay | Admitting: Hematology and Oncology

## 2015-07-17 ENCOUNTER — Encounter: Payer: Self-pay | Admitting: Skilled Nursing Facility1

## 2015-07-17 ENCOUNTER — Other Ambulatory Visit: Payer: Self-pay

## 2015-07-17 ENCOUNTER — Ambulatory Visit
Admission: RE | Admit: 2015-07-17 | Discharge: 2015-07-17 | Disposition: A | Discharge status: Home / Self Care | Payer: Medicaid Other | Source: Ambulatory Visit | Attending: Radiation Oncology | Admitting: Radiation Oncology

## 2015-07-17 ENCOUNTER — Encounter (HOSPITAL_COMMUNITY): Payer: Self-pay

## 2015-07-17 ENCOUNTER — Ambulatory Visit (HOSPITAL_BASED_OUTPATIENT_CLINIC_OR_DEPARTMENT_OTHER): Payer: Medicaid Other | Admitting: Hematology and Oncology

## 2015-07-17 ENCOUNTER — Other Ambulatory Visit (HOSPITAL_BASED_OUTPATIENT_CLINIC_OR_DEPARTMENT_OTHER): Payer: Medicaid Other

## 2015-07-17 VITALS — BP 126/79 | HR 108 | Temp 97.8°F | Resp 20 | Ht 62.0 in | Wt 161.6 lb

## 2015-07-17 DIAGNOSIS — N2889 Other specified disorders of kidney and ureter: Secondary | ICD-10-CM | POA: Diagnosis present

## 2015-07-17 DIAGNOSIS — R945 Abnormal results of liver function studies: Secondary | ICD-10-CM | POA: Diagnosis not present

## 2015-07-17 DIAGNOSIS — B370 Candidal stomatitis: Secondary | ICD-10-CM | POA: Diagnosis not present

## 2015-07-17 DIAGNOSIS — E861 Hypovolemia: Secondary | ICD-10-CM | POA: Diagnosis not present

## 2015-07-17 DIAGNOSIS — R740 Nonspecific elevation of levels of transaminase and lactic acid dehydrogenase [LDH]: Secondary | ICD-10-CM

## 2015-07-17 DIAGNOSIS — R1011 Right upper quadrant pain: Secondary | ICD-10-CM | POA: Diagnosis not present

## 2015-07-17 DIAGNOSIS — A419 Sepsis, unspecified organism: Secondary | ICD-10-CM | POA: Diagnosis present

## 2015-07-17 DIAGNOSIS — Z9049 Acquired absence of other specified parts of digestive tract: Secondary | ICD-10-CM | POA: Diagnosis not present

## 2015-07-17 DIAGNOSIS — Z171 Estrogen receptor negative status [ER-]: Secondary | ICD-10-CM | POA: Diagnosis not present

## 2015-07-17 DIAGNOSIS — E8770 Fluid overload, unspecified: Secondary | ICD-10-CM | POA: Diagnosis not present

## 2015-07-17 DIAGNOSIS — R160 Hepatomegaly, not elsewhere classified: Secondary | ICD-10-CM | POA: Diagnosis not present

## 2015-07-17 DIAGNOSIS — C50412 Malignant neoplasm of upper-outer quadrant of left female breast: Secondary | ICD-10-CM | POA: Diagnosis present

## 2015-07-17 DIAGNOSIS — J9690 Respiratory failure, unspecified, unspecified whether with hypoxia or hypercapnia: Secondary | ICD-10-CM

## 2015-07-17 DIAGNOSIS — M5136 Other intervertebral disc degeneration, lumbar region: Secondary | ICD-10-CM | POA: Diagnosis present

## 2015-07-17 DIAGNOSIS — R6520 Severe sepsis without septic shock: Secondary | ICD-10-CM | POA: Diagnosis present

## 2015-07-17 DIAGNOSIS — K652 Spontaneous bacterial peritonitis: Secondary | ICD-10-CM | POA: Diagnosis not present

## 2015-07-17 DIAGNOSIS — R180 Malignant ascites: Secondary | ICD-10-CM | POA: Diagnosis present

## 2015-07-17 DIAGNOSIS — D72829 Elevated white blood cell count, unspecified: Secondary | ICD-10-CM

## 2015-07-17 DIAGNOSIS — R1084 Generalized abdominal pain: Secondary | ICD-10-CM | POA: Diagnosis not present

## 2015-07-17 DIAGNOSIS — E871 Hypo-osmolality and hyponatremia: Secondary | ICD-10-CM | POA: Diagnosis present

## 2015-07-17 DIAGNOSIS — R0602 Shortness of breath: Secondary | ICD-10-CM | POA: Diagnosis present

## 2015-07-17 DIAGNOSIS — E46 Unspecified protein-calorie malnutrition: Secondary | ICD-10-CM | POA: Diagnosis present

## 2015-07-17 DIAGNOSIS — R188 Other ascites: Secondary | ICD-10-CM

## 2015-07-17 DIAGNOSIS — R748 Abnormal levels of other serum enzymes: Secondary | ICD-10-CM | POA: Diagnosis not present

## 2015-07-17 DIAGNOSIS — Z6831 Body mass index (BMI) 31.0-31.9, adult: Secondary | ICD-10-CM

## 2015-07-17 DIAGNOSIS — R7401 Elevation of levels of liver transaminase levels: Secondary | ICD-10-CM | POA: Diagnosis present

## 2015-07-17 DIAGNOSIS — K7290 Hepatic failure, unspecified without coma: Secondary | ICD-10-CM | POA: Diagnosis present

## 2015-07-17 DIAGNOSIS — E875 Hyperkalemia: Secondary | ICD-10-CM | POA: Diagnosis not present

## 2015-07-17 DIAGNOSIS — C50912 Malignant neoplasm of unspecified site of left female breast: Secondary | ICD-10-CM | POA: Diagnosis not present

## 2015-07-17 DIAGNOSIS — R070 Pain in throat: Secondary | ICD-10-CM | POA: Diagnosis not present

## 2015-07-17 DIAGNOSIS — C773 Secondary and unspecified malignant neoplasm of axilla and upper limb lymph nodes: Secondary | ICD-10-CM

## 2015-07-17 DIAGNOSIS — R000 Tachycardia, unspecified: Secondary | ICD-10-CM | POA: Diagnosis not present

## 2015-07-17 DIAGNOSIS — C50919 Malignant neoplasm of unspecified site of unspecified female breast: Secondary | ICD-10-CM | POA: Diagnosis not present

## 2015-07-17 DIAGNOSIS — Q059 Spina bifida, unspecified: Secondary | ICD-10-CM

## 2015-07-17 DIAGNOSIS — K659 Peritonitis, unspecified: Secondary | ICD-10-CM | POA: Diagnosis not present

## 2015-07-17 DIAGNOSIS — R1030 Lower abdominal pain, unspecified: Secondary | ICD-10-CM | POA: Diagnosis not present

## 2015-07-17 DIAGNOSIS — C787 Secondary malignant neoplasm of liver and intrahepatic bile duct: Secondary | ICD-10-CM | POA: Diagnosis present

## 2015-07-17 DIAGNOSIS — R109 Unspecified abdominal pain: Secondary | ICD-10-CM | POA: Diagnosis present

## 2015-07-17 DIAGNOSIS — Z95828 Presence of other vascular implants and grafts: Secondary | ICD-10-CM | POA: Insufficient documentation

## 2015-07-17 HISTORY — PX: PARACENTESIS: SHX844

## 2015-07-17 LAB — I-STAT CG4 LACTIC ACID, ED
LACTIC ACID, VENOUS: 6.09 mmol/L — AB (ref 0.5–2.0)
LACTIC ACID, VENOUS: 7.04 mmol/L — AB (ref 0.5–2.0)
Lactic Acid, Venous: 5.85 mmol/L (ref 0.5–2.0)

## 2015-07-17 LAB — CBC WITH DIFFERENTIAL/PLATELET
BASO%: 0.6 % (ref 0.0–2.0)
BASOS ABS: 0 10*3/uL (ref 0.0–0.1)
BASOS PCT: 0 %
Basophils Absolute: 0.1 10*3/uL (ref 0.0–0.1)
EOS ABS: 0 10*3/uL (ref 0.0–0.5)
EOS%: 0.2 % (ref 0.0–7.0)
Eosinophils Absolute: 0 10*3/uL (ref 0.0–0.7)
Eosinophils Relative: 0 %
HEMATOCRIT: 39.4 % (ref 34.8–46.6)
HEMATOCRIT: 40 % (ref 36.0–46.0)
HEMOGLOBIN: 12.9 g/dL (ref 12.0–15.0)
HGB: 12.8 g/dL (ref 11.6–15.9)
LYMPH%: 25.5 % (ref 14.0–49.7)
LYMPHS PCT: 30 %
Lymphs Abs: 2.8 10*3/uL (ref 0.7–4.0)
MCH: 27.4 pg (ref 25.1–34.0)
MCH: 27.5 pg (ref 26.0–34.0)
MCHC: 32.6 g/dL (ref 31.5–36.0)
MCHC: 32.3 g/dL (ref 30.0–36.0)
MCV: 84.2 fL (ref 79.5–101.0)
MCV: 85.3 fL (ref 78.0–100.0)
MONO ABS: 0.9 10*3/uL (ref 0.1–1.0)
MONO#: 1 10*3/uL — ABNORMAL HIGH (ref 0.1–0.9)
MONO%: 10.4 % (ref 0.0–14.0)
Monocytes Relative: 10 %
NEUT%: 63.3 % (ref 38.4–76.8)
NEUTROS ABS: 5.8 10*3/uL (ref 1.5–6.5)
NEUTROS ABS: 5.6 10*3/uL (ref 1.7–7.7)
NEUTROS PCT: 60 %
PLATELETS: 467 10*3/uL — AB (ref 145–400)
Platelets: 587 10*3/uL — ABNORMAL HIGH (ref 150–400)
RBC: 4.68 10*6/uL (ref 3.70–5.45)
RBC: 4.69 MIL/uL (ref 3.87–5.11)
RDW: 14.2 % (ref 11.2–14.5)
RDW: 14.2 % (ref 11.5–15.5)
WBC: 9.1 10*3/uL (ref 3.9–10.3)
WBC: 9.3 10*3/uL (ref 4.0–10.5)
lymph#: 2.3 10*3/uL (ref 0.9–3.3)

## 2015-07-17 LAB — ALBUMIN, FLUID (OTHER): ALBUMIN FL: 1.3 g/dL

## 2015-07-17 LAB — URINALYSIS, ROUTINE W REFLEX MICROSCOPIC
Glucose, UA: NEGATIVE mg/dL
Hgb urine dipstick: NEGATIVE
Ketones, ur: 40 mg/dL — AB
Leukocytes, UA: NEGATIVE
NITRITE: NEGATIVE
PH: 6 (ref 5.0–8.0)
Protein, ur: NEGATIVE mg/dL

## 2015-07-17 LAB — COMPREHENSIVE METABOLIC PANEL
ALBUMIN: 2.4 g/dL — AB (ref 3.5–5.0)
ALBUMIN: 2.2 g/dL — AB (ref 3.5–5.0)
ALK PHOS: 316 U/L — AB (ref 40–150)
ALK PHOS: 262 U/L — AB (ref 38–126)
ALT: 94 U/L — AB (ref 0–55)
ALT: 76 U/L — AB (ref 14–54)
ANION GAP: 18 meq/L — AB (ref 3–11)
AST: 492 U/L (ref 5–34)
AST: 412 U/L — AB (ref 15–41)
Anion gap: 14 (ref 5–15)
BILIRUBIN TOTAL: 0.92 mg/dL (ref 0.20–1.20)
BUN: 11.1 mg/dL (ref 7.0–26.0)
BUN: 11 mg/dL (ref 6–20)
CALCIUM: 9.2 mg/dL (ref 8.4–10.4)
CALCIUM: 7.9 mg/dL — AB (ref 8.9–10.3)
CHLORIDE: 100 mmol/L — AB (ref 101–111)
CO2: 21 meq/L — AB (ref 22–29)
CO2: 20 mmol/L — ABNORMAL LOW (ref 22–32)
CREATININE: 0.7 mg/dL (ref 0.6–1.1)
Chloride: 94 mEq/L — ABNORMAL LOW (ref 98–109)
Creatinine, Ser: 0.62 mg/dL (ref 0.44–1.00)
EGFR: >90 mL/min/{1.73_m2} (ref >90)
GFR calc non Af Amer: >60 mL/min (ref >60)
Glucose, Bld: 79 mg/dL (ref 65–99)
Glucose: 73 mg/dl (ref 70–140)
Potassium: 5 mEq/L (ref 3.5–5.1)
Potassium: 4.4 mmol/L (ref 3.5–5.1)
SODIUM: 134 mmol/L — AB (ref 135–145)
Sodium: 133 mEq/L — ABNORMAL LOW (ref 136–145)
TOTAL PROTEIN: 6.4 g/dL (ref 6.4–8.3)
TOTAL PROTEIN: 5.4 g/dL — AB (ref 6.5–8.1)
Total Bilirubin: 1.4 mg/dL — ABNORMAL HIGH (ref 0.3–1.2)

## 2015-07-17 LAB — GRAM STAIN

## 2015-07-17 LAB — BODY FLUID CELL COUNT WITH DIFFERENTIAL
LYMPHS FL: 38 %
Monocyte-Macrophage-Serous Fluid: 38 % — ABNORMAL LOW (ref 50–90)
NEUTROPHIL FLUID: 24 % (ref 0–25)
WBC FLUID: 2060 uL — AB (ref 0–1000)

## 2015-07-17 LAB — GLUCOSE, SEROUS FLUID: GLUCOSE FL: 79 mg/dL

## 2015-07-17 LAB — PROTIME-INR
INR: 1.04 (ref 0.00–1.49)
Prothrombin Time: 13.8 seconds (ref 11.6–15.2)

## 2015-07-17 LAB — PROTEIN, BODY FLUID: Total protein, fluid: <3 g/dL

## 2015-07-17 MED ORDER — FENTANYL CITRATE (PF) 100 MCG/2ML IJ SOLN
100.0000 ug | Freq: Once | INTRAMUSCULAR | Status: AC
Start: 2015-07-17 — End: 2015-07-17
  Administered 2015-07-17: 100 ug via INTRAVENOUS
  Filled 2015-07-17: qty 2

## 2015-07-17 MED ORDER — HYDROMORPHONE HCL 1 MG/ML IJ SOLN
1.0000 mg | Freq: Once | INTRAMUSCULAR | Status: AC
  Administered 2015-07-17: 1 mg via INTRAVENOUS
  Filled 2015-07-17: qty 1

## 2015-07-17 MED ORDER — ONDANSETRON HCL 4 MG/2ML IJ SOLN
4.0000 mg | Freq: Four times a day (QID) | INTRAMUSCULAR | Status: DC | PRN
  Administered 2015-07-18: 4 mg via INTRAVENOUS
  Filled 2015-07-17: qty 2

## 2015-07-17 MED ORDER — FENTANYL CITRATE (PF) 100 MCG/2ML IJ SOLN
100.0000 ug | Freq: Once | INTRAMUSCULAR | Status: AC
  Administered 2015-07-17: 100 ug via INTRAVENOUS
  Filled 2015-07-17: qty 2

## 2015-07-17 MED ORDER — IOHEXOL 300 MG/ML  SOLN: 25.0000 mL | Freq: Once | INTRAMUSCULAR | Status: DC | PRN

## 2015-07-17 MED ORDER — ACETAMINOPHEN 650 MG RE SUPP: 650.0000 mg | Freq: Four times a day (QID) | RECTAL | Status: DC | PRN

## 2015-07-17 MED ORDER — IOHEXOL 300 MG/ML  SOLN
100.0000 mL | Freq: Once | INTRAMUSCULAR | Status: AC | PRN
  Administered 2015-07-17: 100 mL via INTRAVENOUS

## 2015-07-17 MED ORDER — HYDROMORPHONE HCL 1 MG/ML IJ SOLN
0.5000 mg | INTRAMUSCULAR | Status: DC | PRN
  Administered 2015-07-18 – 2015-07-24 (×28): 1 mg via INTRAVENOUS
  Filled 2015-07-17 (×28): qty 1

## 2015-07-17 MED ORDER — PIPERACILLIN-TAZOBACTAM 3.375 G IVPB
3.3750 g | Freq: Three times a day (TID) | INTRAVENOUS | Status: DC
  Administered 2015-07-17 – 2015-07-24 (×21): 3.375 g via INTRAVENOUS
  Filled 2015-07-17 (×20): qty 50

## 2015-07-17 MED ORDER — OXYCODONE HCL 5 MG PO TABS
5.0000 mg | ORAL_TABLET | ORAL | Status: DC | PRN
  Administered 2015-07-20 – 2015-07-28 (×6): 5 mg via ORAL
  Filled 2015-07-17 (×6): qty 1

## 2015-07-17 MED ORDER — PIPERACILLIN-TAZOBACTAM 3.375 G IVPB 30 MIN
3.3750 g | Freq: Once | INTRAVENOUS | Status: AC
  Administered 2015-07-17: 3.375 g via INTRAVENOUS
  Filled 2015-07-17: qty 50

## 2015-07-17 MED ORDER — ONDANSETRON HCL 4 MG PO TABS: 4.0000 mg | ORAL_TABLET | Freq: Four times a day (QID) | ORAL | Status: DC | PRN

## 2015-07-17 MED ORDER — ONDANSETRON HCL 4 MG/2ML IJ SOLN
4.0000 mg | Freq: Once | INTRAMUSCULAR | Status: AC
  Administered 2015-07-17: 4 mg via INTRAVENOUS
  Filled 2015-07-17: qty 2

## 2015-07-17 MED ORDER — SODIUM CHLORIDE 0.9 % IV SOLN
Freq: Once | INTRAVENOUS | Status: AC
  Administered 2015-07-17: 11:00:00 via INTRAVENOUS

## 2015-07-17 MED ORDER — SODIUM CHLORIDE 0.9 % IV BOLUS (SEPSIS)
2500.0000 mL | Freq: Once | INTRAVENOUS | Status: AC
  Administered 2015-07-17: 2500 mL via INTRAVENOUS

## 2015-07-17 MED ORDER — ACETAMINOPHEN 325 MG PO TABS: 650.0000 mg | ORAL_TABLET | Freq: Four times a day (QID) | ORAL | Status: DC | PRN

## 2015-07-17 MED ORDER — PIPERACILLIN-TAZOBACTAM 3.375 G IVPB
3.3750 g | Freq: Three times a day (TID) | INTRAVENOUS | Status: DC
  Administered 2015-07-17: 3.375 g via INTRAVENOUS
  Filled 2015-07-17: qty 50

## 2015-07-17 NOTE — ED Notes (Signed)
She has recently returned from [place], and had sm. Emesis upon re-arrival to E.D. Room.  Order rec'd. For Zofran--same given IV.

## 2015-07-17 NOTE — ED Notes (Signed)
Pt to xray

## 2015-07-17 NOTE — Assessment & Plan Note (Addendum)
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, Ki-67 90%;  left axillary lymph node biopsy: IDC grade 3 completely replacing the lymph node ER PR 0%, HER-2 negative ratio 1.22 Ki-67 90% Left breast mass 5.3 x 5.2 x 2.7 cm at 1:30 position, multiple abnormal enlarged left axillary lymph nodes  Clinical stage: T3 N1 stage III a  Marked ascites with elevated liver function tests: I reviewed the ultrasound abdomen report on 06/11/2015 which showed hepatomegaly with heterogeneous echogenicity of the liver with nodular appearance. Now presenting with marked ascites which is new compared to November. Other liver disorders including autoimmune hepatitis, hepatitis C, hepatitis B will need to be ruled out.  Respiratory distress: Patient is breathing at 35-40 times per minute. Patient also has tachycardia which makes me concerned whether she has pulmonary embolism  I recommended transfer to the emergency room to get evaluated for her complex liver, abdominal, respiratory issues.  Her breast cancer treatment will be put on hold until her health stabilizes. Our original plan was to do genetics counseling, breast MRI, neoadjuvant chemotherapy followed by surgery followed by radiation. She would also need staging studies with CT scans and bone scans for evaluation of her aggressive and locally advanced breast cancer. She is not stable to implement the plan at this time.

## 2015-07-17 NOTE — ED Notes (Signed)
Upon her return from u/s (where they removed 6 liters of "bloody fluid" per their P.A.) she had a flare of pain, for which we gave IV fentanyl.  She is more comfortable now and is able to, and prefers to, lie flat.

## 2015-07-17 NOTE — ED Notes (Signed)
Made 1st request for urine sample,pt can't provide one at this time

## 2015-07-17 NOTE — ED Notes (Signed)
Taken to u/s at this time per their call.

## 2015-07-17 NOTE — ED Provider Notes (Signed)
CSN: [id number]     Arrival date & time 07/17/15  2778 History   First MD Initiated Contact with Patient 07/17/15 1034     Chief Complaint  Patient presents with  . Ca pt, SOB, Ascites      (Consider location/radiation/quality/duration/timing/severity/associated sxs/prior Treatment) HPI Complains of shortness of breath progressively worsening over the past 2 weeks and increasing abdominal swelling over the past 2 weeks. Other associated symptoms include low nonradiating back pain. Denies cough denies fever. Denies chest pain Nothing makes symptoms better or worse. Seen by Dr.Gudena immediately prior to coming here, no treatment rendered. Sent for further evaluation and treatment. No other associated symptoms. Past Medical History  Diagnosis Date  . Spina bifida ([place])   . Chlamydia   . Blood transfusion without reported diagnosis 2009    after postpartum hemorrhage  . Postpartum hemorrhage 2009    28 week fetal demise  . DDD (degenerative disc disease), lumbar   . Breast cancer of upper-outer quadrant of left female breast ([place]) 07/11/2015  . Breast cancer [facility])    Past Surgical History  Procedure Laterality Date  . Wisdom tooth extraction    . Induced abortion    . Dilation and curettage of uterus    . Cesarean section N/A 01/22/2013    Procedure: primary CESAREAN SECTION of baby boy at 2001;  Surgeon: Cheri Fowler, MD;  Location: [facility];  Service: Obstetrics;  Laterality: N/A;  . Cholecystectomy  Aug 2000   Family History  Problem Relation Age of Onset  . Diabetes Maternal Grandmother   . Lung cancer Paternal Grandfather    Social History  Substance Use Topics  . Smoking status: Never Smoker   . Smokeless tobacco: Never Used  . Alcohol Use: No   OB History    Gravida Para Term Preterm AB TAB SAB Ectopic Multiple Living   '15 7 4 3 7 1 6   6     '$ Review of Systems  Respiratory: Positive for shortness of breath.   Gastrointestinal: Positive for abdominal distention.   Musculoskeletal: Positive for back pain.  Allergic/Immunologic: Positive for immunocompromised state.       Cancer patient  All other systems reviewed and are negative.     Allergies  Review of patient's allergies indicates no known allergies.  Home Medications   Prior to Admission medications   Medication Sig Start Date End Date Taking? Authorizing Provider  Cholecalciferol (VITAMIN D) 2000 units CAPS Take by mouth.    Historical Provider, MD  ondansetron (ZOFRAN) 4 MG tablet Take 1 tablet (4 mg total) by mouth every 6 (six) hours. Patient not taking: Reported on 07/17/2015 06/25/15   [place] Lions, PA-C  Prenatal Vit-Fe Fumarate-FA (PRENATAL MULTIVITAMIN) TABS Take 1 tablet by mouth daily. Reported on 07/17/2015    Historical Provider, MD  Probiotic Product (SOLUBLE FIBER/PROBIOTICS PO) Take by mouth.    Historical Provider, MD  UNABLE TO FIND Liver support    Historical Provider, MD  vitamin B-12 (CYANOCOBALAMIN) 100 MCG tablet Take 100 mcg by mouth daily.    Historical Provider, MD   Pulse 132  Resp 26  SpO2 98%  LMP 05/12/2015 Physical Exam  Constitutional: She is oriented to person, place, and time.  Chronically ill-appearing  HENT:  Head: Normocephalic and atraumatic.  Right Ear: External ear normal.  Left Ear: External ear normal.  Eyes: Conjunctivae are normal. Pupils are equal, round, and reactive to light.  Sclera are icteric  Neck: Neck supple. No tracheal deviation  present. No thyromegaly present.  Cardiovascular: Regular rhythm.   No murmur heard. Tachycardic  Pulmonary/Chest: Effort normal and breath sounds normal.  Abdominal: Soft. Bowel sounds are normal. She exhibits no distension. There is no tenderness.  Markedly distended  Musculoskeletal: Normal range of motion. She exhibits no edema or tenderness.  No spinal tenderness over external result redness swelling or tenderness neurovascular intact  Neurological: She is alert and oriented to person,  place, and time. No cranial nerve deficit. Coordination normal.  Skin: Skin is warm and dry. No rash noted.  Psychiatric: She has a normal mood and affect.  Nursing note and vitals reviewed.   ED Course  Procedures (including critical care time) Labs Review Labs Reviewed  URINALYSIS, ROUTINE W REFLEX MICROSCOPIC (NOT AT [facility])  I-STAT CG4 LACTIC ACID, ED    Imaging Review No results found. I have personally reviewed and evaluated these images and lab results as part of my medical decision-making.   EKG Interpretation None     ED ECG REPORT   Date: 07/17/2015  Rate: 115  Rhythm: sinus tachycardia  QRS Axis: normal  Intervals: normal  ST/T Wave abnormalities: nonspecific T wave changes  Conduction Disutrbances:none  Narrative Interpretation:   Old EKG Reviewed: none available   I have personally reviewed the EKG tracing and agree with the computerized printout as noted. Patient was sent to paracentesis ultrasound-guided. After paracentesis performed patient had exquisite right-sided abdominal pain which she did not have prior to paracentesis her breathing was normal after paracentesis. Reportedly 8 L of fluid was drained from her abdomen during paracentesis At 5 PM her pain is controlled after multiple doses of intravenous opioid pain medicine, iv antibiotics and iv fluids. I spoke with Dr. Jamison Neighbor from critical care service who requested CT scan of abdomen and pelvis. He does not feel that patient necessarily warrants ICU admission based on rising lactate Patient signed out to Dr.[name] at 5 pm Results for orders placed or performed during the hospital encounter of 07/17/15  CBC WITH DIFFERENTIAL  Result Value Ref Range   WBC 9.3 4.0 - 10.5 K/uL   RBC 4.69 3.87 - 5.11 MIL/uL   Hemoglobin 12.9 12.0 - 15.0 g/dL   HCT 13.6 43.8 - 37.7 %   MCV 85.3 78.0 - 100.0 fL   MCH 27.5 26.0 - 34.0 pg   MCHC 32.3 30.0 - 36.0 g/dL   RDW 93.9 68.8 - 64.8 %   Platelets 587 (H) 150 - 400  K/uL   Neutrophils Relative % 60 %   Neutro Abs 5.6 1.7 - 7.7 K/uL   Lymphocytes Relative 30 %   Lymphs Abs 2.8 0.7 - 4.0 K/uL   Monocytes Relative 10 %   Monocytes Absolute 0.9 0.1 - 1.0 K/uL   Eosinophils Relative 0 %   Eosinophils Absolute 0.0 0.0 - 0.7 K/uL   Basophils Relative 0 %   Basophils Absolute 0.0 0.0 - 0.1 K/uL  Protime-INR  Result Value Ref Range   Prothrombin Time 13.8 11.6 - 15.2 seconds   INR 1.04 0.00 - 1.49  Comprehensive metabolic panel  Result Value Ref Range   Sodium 134 (L) 135 - 145 mmol/L   Potassium 4.4 3.5 - 5.1 mmol/L   Chloride 100 (L) 101 - 111 mmol/L   CO2 20 (L) 22 - 32 mmol/L   Glucose, Bld 79 65 - 99 mg/dL   BUN 11 6 - 20 mg/dL   Creatinine, Ser 4.72 0.44 - 1.00 mg/dL   Calcium 7.9 (L)  8.9 - 10.3 mg/dL   Total Protein 5.4 (L) 6.5 - 8.1 g/dL   Albumin 2.2 (L) 3.5 - 5.0 g/dL   AST 412 (H) 15 - 41 U/L   ALT 76 (H) 14 - 54 U/L   Alkaline Phosphatase 262 (H) 38 - 126 U/L   Total Bilirubin 1.4 (H) 0.3 - 1.2 mg/dL   GFR calc non Af Amer >60 >60 mL/min   GFR calc Af Amer >60 >60 mL/min   Anion gap 14 5 - 15  Body fluid cell count with differential  Result Value Ref Range   Fluid Type-FCT PERITONEAL CAVITY    Color, Fluid RED (A) YELLOW   Appearance, Fluid OPAQUE (A) CLEAR   WBC, Fluid 2060 (H) 0 - 1000 cu mm   Neutrophil Count, Fluid 24 0 - 25 %   Lymphs, Fluid 38 %   Monocyte-Macrophage-Serous Fluid 38 (L) 50 - 90 %   Other Cells, Fluid OTHER CELLS IDENTIFIED AS MESOTHELIAL CELLS %  I-Stat CG4 Lactic Acid, ED (Not at [facility])  Result Value Ref Range   Lactic Acid, Venous 5.85 (HH) 0.5 - 2.0 mmol/L   Comment NOTIFIED PHYSICIAN   I-Stat CG4 Lactic Acid, ED  (not at  [facility])  Result Value Ref Range   Lactic Acid, Venous 6.09 (HH) 0.5 - 2.0 mmol/L   Comment NOTIFIED PHYSICIAN    Dg Chest 2 View  07/17/2015  CLINICAL DATA:  Shortness of breath developing over the past few days. EXAM: CHEST  2 VIEW COMPARISON:  04/26/2010 FINDINGS: Hypoventilation  without pneumonia or edema. No effusion or pneumothorax. Normal heart size and mediastinal contours. Mild thoracic dextrocurvature, likely accentuated by positioning. IMPRESSION: No active cardiopulmonary disease. Electronically Signed   By: Monte Fantasia M.D.   On: 07/17/2015 11:38   US Paracentesis  07/17/2015  INDICATION: Breast cancer, ascites. Request is made for diagnostic and therapeutic paracentesis. EXAM: ULTRASOUND-GUIDED DIAGNOSTIC AND THERAPEUTIC PARACENTESIS COMPARISON:  None. MEDICATIONS: None. COMPLICATIONS: None immediate TECHNIQUE: Informed written consent was obtained from the patient after a discussion of the risks, benefits and alternatives to treatment. A timeout was performed prior to the initiation of the procedure. Initial ultrasound scanning demonstrates a large amount of ascites within the right lower abdominal quadrant. The right lower abdomen was prepped and draped in the usual sterile fashion. 1% lidocaine was used for local anesthesia. Under direct ultrasound guidance, a 19 gauge, 7-cm, Yueh catheter was introduced. An ultrasound image was saved for documentation purposed. The paracentesis was performed. The catheter was removed and a dressing was applied. The patient tolerated the procedure well without immediate post procedural complication. FINDINGS: A total of approximately 6 liters of turbid, bloody fluid was removed. Samples were sent to the laboratory as requested by the clinical team. IMPRESSION: Successful ultrasound-guided diagnostic and therapeutic paracentesis yielding 6 liters of peritoneal fluid. Dr. [name] was notified of the above findings. Read by: Rowe Robert, PA-C Electronically Signed   By: Sandi Mariscal M.D.   On: 07/17/2015 15:37   Mm Digital Diagnostic Unilat L  07/09/2015  CLINICAL DATA:  Post biopsy mammogram of the left breast for clip placement. EXAM: DIAGNOSTIC LEFT MAMMOGRAM POST ULTRASOUND BIOPSY COMPARISON:  Previous exam(s). FINDINGS: Mammographic  images were obtained following ultrasound guided biopsy of a left breast mass at 130 and a left axillary lymph node. The coil shaped biopsy marking clip is appropriately positioned within the mass in the left breast at 130. [facility] spiral shaped biopsy marking clip was not visualized despite  multiple attempts, however was clearly seen by ultrasound. IMPRESSION: 1. Appropriate positioning of the coil shaped biopsy marking clip at the intended site of biopsy at 130 in the left breast. 2. The HydroMARK spiral shaped biopsy marking clip within the left axillary lymph node is not visualized mammographically, likely due to deep positioning. This was able to be visualized post deployment by ultrasound. Final Assessment: Post Procedure Mammograms for Marker Placement Electronically Signed   By: Ammie Ferrier M.D.   On: 07/09/2015 14:44   US Breast Ltd Uni Left Inc Axilla  07/02/2015  CLINICAL DATA:  Enlarging mass felt by the patient in the axillary tail region of the left breast for the past year. She reports that the mass was initially very small. She finished breast feeding 2 months ago. EXAM: DIGITAL DIAGNOSTIC BILATERAL MAMMOGRAM WITH 3D TOMOSYNTHESIS WITH CAD ULTRASOUND LEFT BREAST COMPARISON:  Previous exam(s). ACR Breast Density Category c: The breast tissue is heterogeneously dense, which may obscure small masses. FINDINGS: 3D tomographic images of both breasts demonstrate a large, oval, lobulated mass in the posterior aspect of the upper-outer left breast, corresponding to the mass felt by the patient. There are no findings on the right suspicious for malignancy. Mammographic images were processed with CAD. On physical exam, the patient has an approximately 8 cm rounded, protruding mass in the axillary tail region of the left breast. There are also mildly palpable left inferior axillary lymph nodes. Targeted ultrasound is performed, showing a large, poorly defined, heterogeneous mass in the 1:30  o'clock position of the left breast, 15 cm from the nipple. This has peripheral echogenic components and central hypoechoic components. The mass measures 5.3 x 5.2 x 2.7 cm in maximum dimensions and has internal blood flow with power Doppler. Ultrasound of the left axilla demonstrated multiple abnormal appearing left axillary lymph nodes. Some have an architecture similar to the breast mass and some are diffusely hypoechoic and rounded. IMPRESSION: 1. 5.3 x 5.2 x 2.7 cm mass in the 1:30 o'clock position of the left breast with imaging features highly suspicious for malignancy. 2. 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 RECOMMENDATION: Ultrasound-guided core needle biopsies of the left breast mass and one of the abnormal appearing left axillary lymph nodes. These have been scheduled at 1 p.m. on 07/09/2015. I have discussed the findings and recommendations with the patient. Results were also provided in writing at the conclusion of the visit. If applicable, a reminder letter will be sent to the patient regarding the next appointment. BI-RADS CATEGORY  5: Highly suggestive of malignancy. Electronically Signed   By: Claudie Revering M.D.   On: 07/02/2015 17:29   Mm Diag Breast Tomo Bilateral  07/02/2015  CLINICAL DATA:  Enlarging mass felt by the patient in the axillary tail region of the left breast for the past year. She reports that the mass was initially very small. She finished breast feeding 2 months ago. EXAM: DIGITAL DIAGNOSTIC BILATERAL MAMMOGRAM WITH 3D TOMOSYNTHESIS WITH CAD ULTRASOUND LEFT BREAST COMPARISON:  Previous exam(s). ACR Breast Density Category c: The breast tissue is heterogeneously dense, which may obscure small masses. FINDINGS: 3D tomographic images of both breasts demonstrate a large, oval, lobulated mass in the posterior aspect of the upper-outer left breast, corresponding to the mass felt by the patient. There are no findings on the right  suspicious for malignancy. Mammographic images were processed with CAD. On physical exam, the patient has an approximately 8 cm rounded, protruding mass in the axillary  tail region of the left breast. There are also mildly palpable left inferior axillary lymph nodes. Targeted ultrasound is performed, showing a large, poorly defined, heterogeneous mass in the 1:30 o'clock position of the left breast, 15 cm from the nipple. This has peripheral echogenic components and central hypoechoic components. The mass measures 5.3 x 5.2 x 2.7 cm in maximum dimensions and has internal blood flow with power Doppler. Ultrasound of the left axilla demonstrated multiple abnormal appearing left axillary lymph nodes. Some have an architecture similar to the breast mass and some are diffusely hypoechoic and rounded. IMPRESSION: 1. 5.3 x 5.2 x 2.7 cm mass in the 1:30 o'clock position of the left breast with imaging features highly suspicious for malignancy. 2. 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 RECOMMENDATION: Ultrasound-guided core needle biopsies of the left breast mass and one of the abnormal appearing left axillary lymph nodes. These have been scheduled at 1 p.m. on 07/09/2015. I have discussed the findings and recommendations with the patient. Results were also provided in writing at the conclusion of the visit. If applicable, a reminder letter will be sent to the patient regarding the next appointment. BI-RADS CATEGORY  5: Highly suggestive of malignancy. Electronically Signed   By: Claudie Revering M.D.   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 There is invasive ductal carcinoma which may represent a completely replaced  lymph node. This was found to be concordant by Dr. Ammie Ferrier. Pathology results were discussed with the patient via telephone. She reported no problems with the biopsy site and is doing well. Post biopsy care and instructions were reviewed and questions were answered. She was encouraged to contact [facility] with any additional questions and or concerns. She was referred to [facility] on July 17, 2015. Pathology results reported by Terie Purser RN on July 10, 2015. Electronically Signed   By: Ammie Ferrier M.D.   On: 07/10/2015 12:56  07/16/2015  CLINICAL DATA:  35 year old female presenting for ultrasound-guided biopsy of a left breast mass and abnormal left axillary lymph node. EXAM: ULTRASOUND GUIDED LEFT BREAST CORE NEEDLE BIOPSY COMPARISON:  Previous exam(s). FINDINGS: I met with the patient and we discussed the procedure of ultrasound-guided biopsy, including benefits and alternatives. We discussed the high likelihood of a successful procedure. We discussed the risks of the procedure, including infection, bleeding, tissue injury, clip migration, and inadequate sampling. Informed written consent was given. The usual time-out protocol was performed immediately prior to the procedure. Using sterile technique and 1% Lidocaine as local anesthetic, under direct ultrasound visualization, a 14 gauge spring-loaded device was used to perform biopsy of mass in the left breast 130 using a lateral approach. At the conclusion of the procedure a coil shaped tissue marker clip was deployed into the biopsy cavity. Using sterile technique and 1% Lidocaine as local anesthetic, under direct ultrasound visualization, a 14 gauge spring-loaded device was used to perform biopsy of abnormal left axillary lymph node using a inferior approach. This was approached using the same incision site as the site for the  breast biopsy. At the conclusion of the procedure a HydroMARK spiral shaped tissue marker  clip was deployed into the biopsy cavity. Follow up 2 view mammogram was performed and dictated separately. IMPRESSION: Ultrasound guided biopsy of left breast mass at 130, and a left axillary lymph node. No apparent complications. Electronically Signed: By: Ammie Ferrier M.D. 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 There is invasive ductal carcinoma which may represent a completely replaced lymph node. This was found to be concordant by Dr. Ammie Ferrier. Pathology results were discussed with the patient via telephone. She reported no problems with the biopsy site and is doing well. Post biopsy care and instructions were reviewed and questions were answered. She was encouraged to contact [facility] with any additional questions and or concerns. She was referred to [facility] on July 17, 2015. Pathology results reported by Terie Purser RN on July 10, 2015. Electronically Signed   By: Ammie Ferrier M.D.   On: 07/10/2015 12:56  07/16/2015  CLINICAL DATA:  35 year old female presenting for ultrasound-guided biopsy of a left breast mass and abnormal left axillary lymph node. EXAM: ULTRASOUND GUIDED LEFT BREAST CORE NEEDLE BIOPSY COMPARISON:  Previous exam(s). FINDINGS: I met with the patient and we discussed the procedure of ultrasound-guided biopsy, including benefits and alternatives. We discussed the high likelihood of a successful procedure. We discussed the risks of the procedure, including infection, bleeding, tissue injury, clip migration, and  inadequate sampling. Informed written consent was given. The usual time-out protocol was performed immediately prior to the procedure. Using sterile technique and 1% Lidocaine as local anesthetic, under direct ultrasound visualization, a 14 gauge spring-loaded device was used to perform biopsy of mass in the left breast 130 using a lateral approach. At the conclusion of the procedure a coil shaped tissue marker clip was deployed into the biopsy cavity. Using sterile technique and 1% Lidocaine as local anesthetic, under direct ultrasound visualization, a 14 gauge spring-loaded device was used to perform biopsy of abnormal left axillary lymph node using a inferior approach. This was approached using the same incision site as the site for the breast biopsy. At the conclusion of the procedure a HydroMARK spiral shaped tissue marker clip was deployed into the biopsy cavity. Follow up 2 view mammogram was performed and dictated separately. IMPRESSION: Ultrasound guided biopsy of left breast mass at 130, and a left axillary lymph node. No apparent complications. Electronically Signed: By: Ammie Ferrier M.D. On: 07/09/2015 14:27    MDM  Ultrasound-guided paracentesis ordered. I don't feel the patient has pulmonary embolism shortness of breath gradual onset and correlates with abdominal distention. Patient may be septic secondary to elevated lactate. Intravenous fluids and antibiotics ordered Final diagnoses:  None   code sepsis, based on sirs criteria Tachycardia, tachypnea, lactic acidosis suspected source of infection intra-abdominal Patient meets clinical criteria for severe sepsis with at least 2 SIRS, evidence of end organ damage and/or lactic acid elevation and suspected source of infection intra-abdominal. Responsive to fluid resuscitation.   Dx #1 severe  Sepsis #2 abdominal pain #3 ascites #4 dyspnea CRITICAL CARE Performed by: Orlie Dakin Total critical care time: 45 minutes Critical care  time was exclusive of separately billable procedures and treating other patients. Critical care  was necessary to treat or prevent imminent or life-threatening deterioration. Critical care was time spent personally by me on the following activities: development of treatment plan with patient and/or surrogate as well as nursing, discussions with consultants, evaluation of patient's response to treatment, examination of patient, obtaining history from patient or surrogate, ordering and performing treatments and interventions, ordering and review of laboratory studies, ordering and review of radiographic studies, pulse oximetry and re-evaluation of patient's condition.    Orlie Dakin, MD 07/17/15 1710

## 2015-07-17 NOTE — Progress Notes (Signed)
[facility] NOTE  Patient Care Team: Mora Bellman, MD as PCP - General (Obstetrics and Gynecology) [name], MD as Consulting Physician (General Surgery) Nicholas Lose, MD as Consulting Physician (Hematology and Oncology) Kyung Rudd, MD as Consulting Physician (Radiation Oncology)  CHIEF COMPLAINTS/PURPOSE OF CONSULTATION:  Newly diagnosed breast cancer  HISTORY OF PRESENTING ILLNESS:  Dawn Haynes 35 y.o. female is here because of recent diagnosis of left breast cancer. She felt a lump in the left upper outer quadrant for the past 1 year. It was slowly increasing in size to the point that it has become painful and was brought to the attention of doctors. she underwent a mammogram and ultrasound that revealed a 5.3 cm mass multiple abnormal left axillary lymph nodes. 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 She was presented this morning in the multidisciplinary tumor board and she is here with accompanied by her family to discuss the treatment plan. On presentation patient was found to be tachypneic and tachycardic. She is in a wheelchair with a prior history of incomplete spina bifida. Her abdomen was markedly bloated and distended. She has been having this problem since 1 month. She is very uncomfortable from the market abdominal distention.  SUMMARY OF ONCOLOGIC HISTORY:   Breast cancer of upper-outer quadrant of left female breast ([place])   07/02/2015 Mammogram Left breast mass 5.3 x 5.2 x 2.7 cm at 1:30 position, multiple abnormal enlarged left axillary lymph nodes   07/09/2015 Initial Diagnosis Left breast biopsy 1:30: IDC grade 3, ER PR 0%, HER-2 negative ratio 1.65, Ki-67 90%; left axillary lymph node biopsy: IDC grade 3 completely replacing the lymph node ER PR 0%, HER-2 negative ratio 1.22 Ki-67 90%   MEDICAL HISTORY:  Past Medical History  Diagnosis Date  . Spina bifida ([place])   .  Chlamydia   . Blood transfusion without reported diagnosis 2009    after postpartum hemorrhage  . Postpartum hemorrhage 2009    28 week fetal demise  . DDD (degenerative disc disease), lumbar   . Breast cancer of upper-outer quadrant of left female breast ([place]) 07/11/2015  . Breast cancer [facility])     SURGICAL HISTORY: Past Surgical History  Procedure Laterality Date  . Wisdom tooth extraction    . Induced abortion    . Dilation and curettage of uterus    . Cesarean section N/A 01/22/2013    Procedure: primary CESAREAN SECTION of baby boy at 2001;  Surgeon: Cheri Fowler, MD;  Location: [facility];  Service: Obstetrics;  Laterality: N/A;  . Cholecystectomy  Aug 2000    SOCIAL HISTORY: Social History   Social History  . Marital Status: Married    Spouse Name: N/A  . Number of Children: N/A  . Years of Education: N/A   Occupational History  . Not on file.   Social History Main Topics  . Smoking status: Never Smoker   . Smokeless tobacco: Never Used  . Alcohol Use: No  . Drug Use: No  . Sexual Activity: Not Currently   Other Topics Concern  . Not on file   Social History Narrative    FAMILY HISTORY: Family History  Problem Relation Age of Onset  . Diabetes Maternal Grandmother   . Lung cancer Paternal Grandfather     ALLERGIES:  has No Known Allergies.  MEDICATIONS:  Current Outpatient Prescriptions  Medication Sig Dispense Refill  . Cholecalciferol (VITAMIN D) 2000 units CAPS Take  by mouth.    . Probiotic Product (SOLUBLE FIBER/PROBIOTICS PO) Take by mouth.    Marland Kitchen UNABLE TO FIND Liver support    . vitamin B-12 (CYANOCOBALAMIN) 100 MCG tablet Take 100 mcg by mouth daily.    . ondansetron (ZOFRAN) 4 MG tablet Take 1 tablet (4 mg total) by mouth every 6 (six) hours. (Patient not taking: Reported on 07/17/2015) 28 tablet 0  . Prenatal Vit-Fe Fumarate-FA (PRENATAL MULTIVITAMIN) TABS Take 1 tablet by mouth daily. Reported on 07/17/2015     No current facility-administered  medications for this visit.    REVIEW OF SYSTEMS:   Constitutional: Denies fevers, chills or abnormal night sweats Eyes: Denies blurriness of vision, double vision or watery eyes Ears, nose, mouth, throat, and face: Denies mucositis or sore throat Respiratory: Severe shortness of breath Cardiovascular: Denies palpitation, chest discomfort or lower extremity swelling Gastrointestinal:  Market abdominal distention with ascites Skin: Denies abnormal skin rashes Lymphatics: Denies new lymphadenopathy or easy bruising Neurological:Denies numbness, tingling or new weaknesses Behavioral/Psych: Mood is stable, no new changes  Breast: Left upper outer quadrant enlarged palpable mass All other systems were reviewed with the patient and are negative.  PHYSICAL EXAMINATION: ECOG PERFORMANCE STATUS: 2 - Symptomatic, <50% confined to bed  Filed Vitals:   07/17/15 0827  BP: 126/79  Pulse: 108  Temp: 97.8 F (36.6 C)  Resp: 20   Filed Weights   07/17/15 0827  Weight: 161 lb 9.6 oz (73.301 kg)    GENERAL:alert, no distress and comfortable SKIN: skin color, texture, turgor are normal, no rashes or significant lesions EYES: normal, conjunctiva are pink and non-injected, sclera clear OROPHARYNX:no exudate, no erythema and lips, buccal mucosa, and tongue normal  NECK: supple, thyroid normal size, non-tender, without nodularity LYMPH:  no palpable lymphadenopathy in the cervical, axillary or inguinal LUNGS: clear to auscultation HEART: Severe tachycardia ABDOMEN: Markedly distended abdomen with ascites Musculoskeletal:no cyanosis of digits and no clubbing  PSYCH: alert & oriented x 3 with fluent speech BREAST: Large palpable mass in the left upper outer quadrant with palpable left axillary lymphadenopathy (exam performed in the presence of a chaperone)   LABORATORY DATA:  I have reviewed the data as listed Lab Results  Component Value Date   WBC 9.1 07/17/2015   HGB 12.8 07/17/2015    HCT 39.4 07/17/2015   MCV 84.2 07/17/2015   PLT 467* 07/17/2015   Lab Results  Component Value Date   NA 133* 07/17/2015   K 5.0 07/17/2015   CL 97* 06/25/2015   CO2 21* 07/17/2015    ASSESSMENT AND PLAN:  Breast cancer of upper-outer quadrant of left female breast (HCC) 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, Ki-67 90%;  left axillary lymph node biopsy: IDC grade 3 completely replacing the lymph node ER PR 0%, HER-2 negative ratio 1.22 Ki-67 90% Left breast mass 5.3 x 5.2 x 2.7 cm at 1:30 position, multiple abnormal enlarged left axillary lymph nodes  Clinical stage: T3 N1 stage III a  Marked ascites with elevated liver function tests: I reviewed the ultrasound abdomen report on 06/11/2015 which showed hepatomegaly with heterogeneous echogenicity of the liver with nodular appearance. Now presenting with marked ascites which is new compared to November. Other liver disorders including autoimmune hepatitis, hepatitis C, hepatitis B will need to be ruled out.  Respiratory distress: Patient is breathing at 35-40 times per minute. Patient also has tachycardia which makes me concerned whether she has pulmonary  embolism/ spesis  I recommended transfer to the emergency room to get evaluated for her complex liver, abdominal, respiratory issues.  Her breast cancer treatment will be put on hold until her health stabilizes. Our original plan was to do genetics counseling, breast MRI, neoadjuvant chemotherapy followed by surgery followed by radiation. She would also need staging studies with CT scans and bone scans for evaluation of her aggressive and locally advanced breast cancer. She is not stable to implement the plan at this time.     All questions were answered. The patient knows to call the clinic with any problems, questions or concerns.    Rulon Eisenmenger, MD 07/17/2015

## 2015-07-17 NOTE — ED Notes (Signed)
Lactic acid result of 6.09, MD notified

## 2015-07-17 NOTE — Procedures (Signed)
Ultrasound-guided diagnostic and therapeutic paracentesis performed yielding 6 L turbid, bloody fluid. Only the above amount of fluid was removed at this time secondary to today being patient's initial paracentesis. A portion of the fluid was submitted to the lab for preordered studies. No immediate complications. Dr. [name] was notified of the above findings.

## 2015-07-17 NOTE — ED Notes (Addendum)
Pt being sent over from the cancer center. Recent dx of left breast cancer, was to see Dr Payton Mccallum as initial visit. Pt SOB with ascites, to be sent to the ED. Resp 38/min  Upon rn assessment pt reports abd swelling x1 month, but in last week increased abdominal swelling, SOB, and abd pain. Pain 10/10. Pt denies ETOH use, denies cough. Abdomen very distended. Was seen in ED 1 week ago for similar complaint. Told to follow up with GI, appt not for a few weeks.

## 2015-07-17 NOTE — Progress Notes (Signed)
Brief note: [facility] with a distended abdomen and elevated liver enzymes. She was subsequently sent to the emergency department before I could evaluate her.   Jodelle Gross, MD, PhD  This document serves as a record of services personally performed by Kyung Rudd, MD. It was created on his behalf by Darcus Austin, a trained medical scribe. The creation of this record is based on the scribe's personal observations and the provider's statements to them. This document has been checked and approved by the attending provider.

## 2015-07-17 NOTE — Progress Notes (Signed)
Per Dr. [name], patient to be taken to ED for evaluation of dyspnea, RR 38, abdominal distension.  Report called to ED Charge RN Faith.  Pt transported to ED by wheelchair with family members.  Assisted pt into gown and on to ED gurney.  Pt handed off to Faith.

## 2015-07-17 NOTE — ED Notes (Signed)
I spoke with the ultrasonographer upon return of pt. (that's who brought her back to E.D.); and she told me they would be sending the fluid obtained from paracentesis to lab for analysis.

## 2015-07-17 NOTE — ED Notes (Signed)
Notified Hospitalist,Jenkins pt. i-stat Lactic acid 7.04. And RN, Shelbie Proctor made aware.

## 2015-07-17 NOTE — Progress Notes (Signed)
PHARMACY NOTE -  [facility] has been assisting with dosing of Zosyn for IAI. Dosage will be ordered and will remain stable at 3.375g IV q8 (extended interval infusion)   and need for further dosage adjustment appears unlikely at present.    Will sign off at this time.  Please reconsult if a change in clinical status warrants re-evaluation of dosage.  Adrian Saran, PharmD, BCPS Pager [phone] 07/17/2015 10:56 AM

## 2015-07-17 NOTE — ED Notes (Signed)
Bed: RESB Expected date:  Expected time:  Means of arrival:  Comments: [place]

## 2015-07-17 NOTE — Progress Notes (Signed)
Subjective:     Patient ID: Dawn Haynes, female   DOB: 1980/08/19, 35 y.o.   MRN: [id number]  HPI   Review of Systems     Objective:   Physical Exam For the patient to understand and be given the tools to implement a healthy plant based diet during their cancer diagnosis.     Assessment:     Patient was admitted to the ED for liver dysfunction. Dietitian did not see pt.     Plan:     No plan identified at this time.

## 2015-07-17 NOTE — H&P (Signed)
ABD Distention [facility] Admission History and Physical       Deborah Kisner [id number] DOB: 1980/08/01 DOA: 07/17/2015  Referring physician: EDP PCP: Mora Bellman, MD  Specialists:   Chief Complaint: SOB and ABD Swelling  HPI: Dawn Haynes is a 35 y.o. female with Left Breast Cancer diagnosed 07/09/2015 who was at her appointment at the Tumor Board Hearing at the [facility] today and was sent to the ED due to her increased SOB, and increased ABD Distention.  She reports that her ABD has been swelling for the past 4 weeks.   She denies any fevers or chills.   She denies any nausea or vomiting.     She was evaluated in the ED and a diagnostic and therapeutic paracentesis was performed and 6 liters of fluid was removed.  She was administered IV Vancomycin and Zosyn for a diagnosis of SBP and referred for admission.       Review of Systems:  Constitutional: No Weight Loss, No Weight Gain, Night Sweats, Fevers, Chills, Dizziness, Light Headedness, Fatigue, or Generalized Weakness HEENT: No Headaches, Difficulty Swallowing,Tooth/Dental Problems,Sore Throat,  No Sneezing, Rhinitis, Ear Ache, Nasal Congestion, or Post Nasal Drip,  Cardio-vascular:  No Chest pain, Orthopnea, PND, Edema in Lower Extremities, Anasarca, Dizziness, Palpitations  Resp: No Dyspnea, No DOE, No Productive Cough, No Non-Productive Cough, No Hemoptysis, No Wheezing.    GI: No Heartburn, Indigestion, +Abdominal Pain,+ABD Distention,  Nausea, Vomiting, Diarrhea, Constipation, Hematemesis, Hematochezia, Melena, Change in Bowel Habits,  Loss of Appetite  GU: No Dysuria, No Change in Color of Urine, No Urgency or Urinary Frequency, No Flank pain.  Musculoskeletal: No Joint Pain or Swelling, No Decreased Range of Motion, No Back Pain.  Neurologic: No Syncope, No Seizures, Muscle Weakness, Paresthesia, Vision Disturbance or Loss, No Diplopia, No Vertigo, No Difficulty Walking,  Skin: No Rash or Lesions. Psych: No  Change in Mood or Affect, No Depression or Anxiety, No Memory loss, No Confusion, or Hallucinations   Past Medical History  Diagnosis Date  . Spina bifida ([place])   . Chlamydia   . Blood transfusion without reported diagnosis 2009    after postpartum hemorrhage  . Postpartum hemorrhage 2009    28 week fetal demise  . DDD (degenerative disc disease), lumbar   . Breast cancer of upper-outer quadrant of left female breast ([place]) 07/11/2015  . Breast cancer [facility])      Past Surgical History  Procedure Laterality Date  . Wisdom tooth extraction    . Induced abortion    . Dilation and curettage of uterus    . Cesarean section N/A 01/22/2013    Procedure: primary CESAREAN SECTION of baby boy at 2001;  Surgeon: Cheri Fowler, MD;  Location: [facility];  Service: Obstetrics;  Laterality: N/A;  . Cholecystectomy  Aug 2000      Prior to Admission medications   Medication Sig Start Date End Date Taking? Authorizing Provider  BORON PO Take 1 tablet by mouth 3 (three) times daily as needed (gas/flatulence).   Yes Historical Provider, MD  Cholecalciferol (VITAMIN D) 2000 units CAPS Take 2,000 Units by mouth daily.    Yes Historical Provider, MD  Probiotic Product (SOLUBLE FIBER/PROBIOTICS PO) Take 1 tablet by mouth daily.    Yes Historical Provider, MD  UNABLE TO FIND Take 1 tablet by mouth daily. Herbal Supplement for-Liver support   Yes Historical Provider, MD  vitamin B-12 (CYANOCOBALAMIN) 100 MCG tablet Take 100 mcg by mouth daily.   Yes Historical Provider, MD  ondansetron (ZOFRAN) 4 MG tablet Take 1 tablet (4 mg total) by mouth every 6 (six) hours. Patient not taking: Reported on 07/17/2015 06/25/15   Gretna Lions, PA-C     No Known Allergies  Social History:  reports that she has never smoked. She has never used smokeless tobacco. She reports that she does not drink alcohol or use illicit drugs.    Family History  Problem Relation Age of Onset  . Diabetes Maternal Grandmother     . Lung cancer Paternal Grandfather        Physical Exam:  GEN:  Pleasant ill appearing  35 y.o.African American female examined and in no acute distress; cooperative with exam Filed Vitals:   07/17/15 1533 07/17/15 1635 07/17/15 1811 07/17/15 1855  BP: 110/81 121/92 116/94 128/93  Pulse: 117 107 105 104  Temp:    97.9 F (36.6 C)  Resp: 30 26  24   Weight:      SpO2: 100% 100% 100% 99%   Blood pressure 128/93, pulse 104, temperature 97.9 F (36.6 C), resp. rate 24, weight 73.029 kg (161 lb), last menstrual period 05/12/2015, SpO2 99 %, unknown if currently breastfeeding. PSYCH: She is alert and oriented x4; does not appear anxious does not appear depressed; affect is normal HEENT: Normocephalic and Atraumatic, Mucous membranes pink; PERRLA; EOM intact; Fundi:  Benign;  No scleral icterus, Nares: Patent, Oropharynx: Clear, Fair Dentition,    Neck:  FROM, No Cervical Lymphadenopathy nor Thyromegaly or Carotid Bruit; No JVD; Breasts:: Not examined CHEST WALL: No tenderness CHEST: Normal respiration, clear to auscultation bilaterally HEART: Regular rate and rhythm; no murmurs rubs or gallops BACK: No kyphosis or scoliosis; No CVA tenderness ABDOMEN: Positive Bowel Sounds,Distended,  Soft Non-Tender, No Rebound or Guarding; No Masses, No Organomegaly.    Rectal Exam: Not done EXTREMITIES: No Cyanosis, Clubbing, or Edema; No Ulcerations. Genitalia: not examined PULSES: 2+ and symmetric SKIN: Normal hydration no rash or ulceration CNS:  Alert and Oriented x 4, No Focal Deficits Vascular: pulses palpable throughout    Labs on Admission:  Basic Metabolic Panel:  Recent Labs Lab 07/17/15 0816 07/17/15 1255  NA 133* 134*  K 5.0 4.4  CL  --  100*  CO2 21* 20*  GLUCOSE 73 79  BUN 11.1 11  CREATININE 0.7 0.62  CALCIUM 9.2 7.9*   Liver Function Tests:  Recent Labs Lab 07/17/15 0816 07/17/15 1255  AST 492* 412*  ALT 94* 76*  ALKPHOS 316* 262*  BILITOT 0.92 1.4*  PROT  6.4 5.4*  ALBUMIN 2.4* 2.2*   No results for input(s): LIPASE, AMYLASE in the last 168 hours. No results for input(s): AMMONIA in the last 168 hours. CBC:  Recent Labs Lab 07/17/15 0816 07/17/15 1040  WBC 9.1 9.3  NEUTROABS 5.8 5.6  HGB 12.8 12.9  HCT 39.4 40.0  MCV 84.2 85.3  PLT 467* 587*   Cardiac Enzymes: No results for input(s): CKTOTAL, CKMB, CKMBINDEX, TROPONINI in the last 168 hours.  BNP (last 3 results) No results for input(s): BNP in the last 8760 hours.  ProBNP (last 3 results) No results for input(s): PROBNP in the last 8760 hours.  CBG: No results for input(s): GLUCAP in the last 168 hours.  Radiological Exams on Admission: Dg Chest 2 View  07/17/2015  CLINICAL DATA:  Shortness of breath developing over the past few days. EXAM: CHEST  2 VIEW COMPARISON:  04/26/2010 FINDINGS: Hypoventilation without pneumonia or edema. No effusion or pneumothorax. Normal heart size and mediastinal  contours. Mild thoracic dextrocurvature, likely accentuated by positioning. IMPRESSION: No active cardiopulmonary disease. Electronically Signed   By: Monte Fantasia M.D.   On: 07/17/2015 11:38   Ct Abdomen Pelvis W Contrast  07/17/2015  CLINICAL DATA:  Left breast cancer. Abdominal swelling and right abdominal pain. EXAM: CT ABDOMEN AND PELVIS WITH CONTRAST TECHNIQUE: Multidetector CT imaging of the abdomen and pelvis was performed using the standard protocol following bolus administration of intravenous contrast. CONTRAST:  147mL OMNIPAQUE IOHEXOL 300 MG/ML  SOLN COMPARISON:  No prior CT abdomen/ pelvis. 06/11/2015 abdominal sonogram. FINDINGS: Lower chest: No significant pulmonary nodules or acute consolidative airspace disease. Oral contrast in the lower thoracic esophagus indicates esophageal dysmotility and/ or gastroesophageal reflux. Hepatobiliary: The liver is enlarged and largely replaced by innumerable (> 30) confluent heterogeneously hypoenhancing liver masses. Representative liver  masses include a 7.1 x 5.4 cm segment 4A left liver lobe mass (series 2/ image 21), a 5.4 x 4.9 cm segment 6 right liver lobe mass (series 2/ image 45) and a 4.9 x 3.8 cm segment 5 right liver lobe mass (series 2/image 48). Status post cholecystectomy. No biliary ductal dilatation. Pancreas: Normal, with no mass or duct dilation. Spleen: Normal size. No mass. Adrenals/Urinary Tract: Normal adrenals. No hydronephrosis and no renal mass. There is mass-effect on right kidney by the enlarged right liver lobe. Normal bladder. Stomach/Bowel: Grossly normal stomach. Normal caliber small bowel with no small bowel wall thickening. Normal appendix. Normal large bowel with no diverticulosis, large bowel wall thickening or pericolonic fat stranding. Vascular/Lymphatic: Normal caliber abdominal aorta. Patent portal, splenic and renal veins. Hepatic veins appear patent and narrowed by surrounding liver masses. No pathologically enlarged lymph nodes in the abdomen or pelvis. Reproductive: Grossly normal uterus.  No adnexal mass. Other: No pneumoperitoneum. Persistent moderate to large volume ascites, predominantly in the pelvis. Musculoskeletal: No aggressive appearing focal osseous lesions. IMPRESSION: 1. Liver is enlarged and largely replaced by extensive confluent metastatic disease. 2. Persistent moderate to large volume ascites, predominantly pelvic. 3. No abdominopelvic lymphadenopathy. 4. Mass-effect on the right kidney by the enlarged right liver lobe. No hydronephrosis. 5. Status post cholecystectomy.  No biliary ductal dilatation. 6. Oral contrast in the lower thoracic esophagus, suggesting esophageal dysmotility and/or gastroesophageal reflux. Electronically Signed   By: Ilona Sorrel M.D.   On: 07/17/2015 17:40   US Paracentesis  07/17/2015  INDICATION: Breast cancer, ascites. Request is made for diagnostic and therapeutic paracentesis. EXAM: ULTRASOUND-GUIDED DIAGNOSTIC AND THERAPEUTIC PARACENTESIS COMPARISON:  None.  MEDICATIONS: None. COMPLICATIONS: None immediate TECHNIQUE: Informed written consent was obtained from the patient after a discussion of the risks, benefits and alternatives to treatment. A timeout was performed prior to the initiation of the procedure. Initial ultrasound scanning demonstrates a large amount of ascites within the right lower abdominal quadrant. The right lower abdomen was prepped and draped in the usual sterile fashion. 1% lidocaine was used for local anesthesia. Under direct ultrasound guidance, a 19 gauge, 7-cm, Yueh catheter was introduced. An ultrasound image was saved for documentation purposed. The paracentesis was performed. The catheter was removed and a dressing was applied. The patient tolerated the procedure well without immediate post procedural complication. FINDINGS: A total of approximately 6 liters of turbid, bloody fluid was removed. Samples were sent to the laboratory as requested by the clinical team. IMPRESSION: Successful ultrasound-guided diagnostic and therapeutic paracentesis yielding 6 liters of peritoneal fluid. Dr. [name] was notified of the above findings. Read by: Rowe Robert, PA-C Electronically Signed   By: Jenny Reichmann  Watts M.D.   On: 07/17/2015 15:37     EKG: Independently reviewed.      Assessment/Plan:      35 y.o. female with  Principal Problem:   1.    SBP (spontaneous bacterial peritonitis) (HCC)/Peritonitis ([place])   IV Zosyn   Sepsis Protocol      Active Problems:   2.   SOB (shortness of breath)   Improved after Paracentesis performed   Monitor O2 sats     3.   Severe sepsis (HCC)-  Vs Lactic Acidosis from Malignancy   Monitor Lactic Acid Levels   Continue IV Zosyn and Sepsis Protocol   Discussed with Critical Care     4.   Ascites-   Transudative - due to Malignancy     5.   Breast cancer of upper-outer quadrant of left female breast ([place])   Notify Oncolgy in AM     6.   Abdominal pain - due to #4 and #1, and  paracentesis   Pain Control PRN with IV Dilaudid        7.   DDD (degenerative disc disease), lumbar   Chronic     8.   DVT Prophylaxis   SCDs    Code Status:     FULL CODE        Family Communication:   Sister and Daughter    Disposition Plan:    Inpatient Status        Time spent:  74 Minutes      Theressa Millard [facility] Pager [phone]   If [place] Please Contact the Day Rounding Team MD for [facility]  If 7PM-7AM, Please Contact Night-Floor Coverage  [email or web address] Password [email or web address] 07/17/2015, 7:52 PM     ADDENDUM:   Patient was seen and examined on 07/17/2015

## 2015-07-18 DIAGNOSIS — K659 Peritonitis, unspecified: Secondary | ICD-10-CM

## 2015-07-18 DIAGNOSIS — C50412 Malignant neoplasm of upper-outer quadrant of left female breast: Secondary | ICD-10-CM

## 2015-07-18 DIAGNOSIS — R1030 Lower abdominal pain, unspecified: Secondary | ICD-10-CM

## 2015-07-18 DIAGNOSIS — R6520 Severe sepsis without septic shock: Secondary | ICD-10-CM

## 2015-07-18 DIAGNOSIS — K652 Spontaneous bacterial peritonitis: Secondary | ICD-10-CM

## 2015-07-18 DIAGNOSIS — A419 Sepsis, unspecified organism: Principal | ICD-10-CM

## 2015-07-18 LAB — BASIC METABOLIC PANEL
ANION GAP: 9 (ref 5–15)
Anion gap: 9 (ref 5–15)
BUN: 11 mg/dL (ref 6–20)
BUN: 10 mg/dL (ref 6–20)
CALCIUM: 8 mg/dL — AB (ref 8.9–10.3)
CALCIUM: 7.1 mg/dL — AB (ref 8.9–10.3)
CO2: 21 mmol/L — ABNORMAL LOW (ref 22–32)
CO2: 21 mmol/L — ABNORMAL LOW (ref 22–32)
CREATININE: 0.63 mg/dL (ref 0.44–1.00)
Chloride: 100 mmol/L — ABNORMAL LOW (ref 101–111)
Chloride: 105 mmol/L (ref 101–111)
Creatinine, Ser: 0.69 mg/dL (ref 0.44–1.00)
GFR calc Af Amer: >60 mL/min (ref >60)
Glucose, Bld: 124 mg/dL — ABNORMAL HIGH (ref 65–99)
Glucose, Bld: 105 mg/dL — ABNORMAL HIGH (ref 65–99)
POTASSIUM: 5.6 mmol/L — AB (ref 3.5–5.1)
Potassium: 4.4 mmol/L (ref 3.5–5.1)
SODIUM: 130 mmol/L — AB (ref 135–145)
SODIUM: 135 mmol/L (ref 135–145)

## 2015-07-18 LAB — LACTIC ACID, PLASMA
LACTIC ACID, VENOUS: 4.3 mmol/L — AB (ref 0.5–2.0)
LACTIC ACID, VENOUS: 4.5 mmol/L — AB (ref 0.5–2.0)
LACTIC ACID, VENOUS: 3.1 mmol/L — AB (ref 0.5–2.0)
LACTIC ACID, VENOUS: 3.4 mmol/L — AB (ref 0.5–2.0)
Lactic Acid, Venous: 6.4 mmol/L (ref 0.5–2.0)
Lactic Acid, Venous: 4.8 mmol/L (ref 0.5–2.0)

## 2015-07-18 LAB — CBC
HCT: 34.9 % — ABNORMAL LOW (ref 36.0–46.0)
HEMOGLOBIN: 10.9 g/dL — AB (ref 12.0–15.0)
MCH: 27.1 pg (ref 26.0–34.0)
MCHC: 31.2 g/dL (ref 30.0–36.0)
MCV: 86.8 fL (ref 78.0–100.0)
Platelets: 433 10*3/uL — ABNORMAL HIGH (ref 150–400)
RBC: 4.02 MIL/uL (ref 3.87–5.11)
RDW: 14.1 % (ref 11.5–15.5)
WBC: 11.8 10*3/uL — AB (ref 4.0–10.5)

## 2015-07-18 LAB — PROCALCITONIN: PROCALCITONIN: 0.56 ng/mL

## 2015-07-18 LAB — MRSA PCR SCREENING: MRSA BY PCR: NEGATIVE

## 2015-07-18 MED ORDER — SODIUM CHLORIDE 0.9 % IV SOLN
INTRAVENOUS | Status: DC
  Administered 2015-07-18: 06:00:00 via INTRAVENOUS

## 2015-07-18 MED ORDER — DIPHENHYDRAMINE HCL 25 MG PO CAPS
25.0000 mg | ORAL_CAPSULE | Freq: Four times a day (QID) | ORAL | Status: DC | PRN
  Administered 2015-07-18 – 2015-07-20 (×5): 25 mg via ORAL
  Filled 2015-07-18 (×5): qty 1

## 2015-07-18 MED ORDER — SODIUM CHLORIDE 0.9 % IV BOLUS (SEPSIS)
2000.0000 mL | Freq: Once | INTRAVENOUS | Status: AC
  Administered 2015-07-18: 2000 mL via INTRAVENOUS

## 2015-07-18 MED ORDER — SODIUM CHLORIDE 0.9 % IV BOLUS (SEPSIS)
1000.0000 mL | Freq: Once | INTRAVENOUS | Status: AC
  Administered 2015-07-18: 1000 mL via INTRAVENOUS

## 2015-07-18 MED ORDER — RISAQUAD PO CAPS
1.0000 | ORAL_CAPSULE | Freq: Every day | ORAL | Status: DC
  Administered 2015-07-18 – 2015-07-28 (×10): 1 via ORAL
  Filled 2015-07-18 (×12): qty 1

## 2015-07-18 MED ORDER — ALUM & MAG HYDROXIDE-SIMETH 200-200-20 MG/5ML PO SUSP: 30.0000 mL | Freq: Four times a day (QID) | ORAL | Status: DC | PRN

## 2015-07-18 MED ORDER — VITAMIN B-12 100 MCG PO TABS
100.0000 ug | ORAL_TABLET | Freq: Every day | ORAL | Status: DC
  Administered 2015-07-18 – 2015-07-29 (×11): 100 ug via ORAL
  Filled 2015-07-18 (×12): qty 1

## 2015-07-18 NOTE — Progress Notes (Signed)
Paged attending MD, Dr.Dhungel to notify this am of patient's lactic acid level of 4.8 which has decreased from 6.8.

## 2015-07-18 NOTE — Progress Notes (Signed)
[facility] PROGRESS NOTE  Dawn Haynes [id number] DOB: 01/24/1981 DOA: 07/17/2015 PCP: Mora Bellman, MD  Brief narrative 35 year old female with recently diagnosed left breast cancer. she felt a lump in her left upper outer quadrant for almost 1 year which is slowly increasing in size and becoming painful. A mammogram and ultrasound showed 5.3 cm mass with abnormal left axillary lymph nodes. Biopsy was positive for high-grade triple negative breast cancer. She was presented to the tumor board on 1/4 and then saw her oncologist Dr. [name] the same day. She was found to be tachycardic and tachypneic with abdominal distention (which she informs for the past 1 month). She was then sent to the ED for further management and paracentesis. ED she underwent diagnostic and therapeutic paracentesis with about 6 L bloody fluid removed. It showed about 20,000 white cells. She was septic with tachycardia, tachypnea and low blood pressure with markedly elevated lactic acid (5.68) She was placed on IV vancomycin and Zosyn concern for SBP and sepsis and admitted to stepdown unit.  Assessment/Plan: Sepsis Likely secondary to SBP versus high tumor burden with liver metastases and malignant ascites. Continue step down monitoring. Aggressive IV hydration. Lactic acid still elevated although trending down slowly. -Supportive care with pain medication (when necessary IV Dilaudid and oxycodone) and antiemetics. -Empiric vancomycin and Zosyn. Follow ascites fluid culture and cytology. -Follow blood culture. Monitor lactate closely. If still persistent despite adequate hydration will consult PC CM.   Metastatic left breast cancer Grade 3 invasive ductal carcinoma as per pathology. Triple negative. CT abdomen and pelvis done on 1/4 showing enlarged liver largely replaced by extensive confluent metastatic disease with large volume ascites mainly in the pelvic area. Also has mass effect on the right kidney by and  large right liver lobe. Dr. [name] is aware of patient's hospitalization. Awaiting ascites fluid cytology results. Supportive care in the meantime. May need a repeat repeated paracentesis in next 2 days  Hyperkalemia Follow-up with repeat labs. Monitor on telemetry.  Protein calorie malnutrition Dietitian consulted  DVT prophylaxis: Subcutaneous Lovenox Diet: Regular   Code Status: Full code Family Communication: Husband at bedside Disposition Plan: Continue step down monitoring   Consultants:  Dr. [name]  Procedures:  CT abdomen and pelvis  Large volume paracentesis on 1/4  Antibiotics: IV vancomycin and Zosyn since 1/4   HPI/Subjective: Seen and examined. Reports her abdominal pain to be better after paracentesis.  Objective: Filed Vitals:   07/18/15 0800 07/18/15 1200  BP: 125/87 122/92  Pulse: 103 113  Temp: 97.5 F (36.4 C)   Resp: 21 21    Intake/Output Summary (Last 24 hours) at 07/18/15 1251 Last data filed at 07/18/15 1200  Gross per 24 hour  Intake   2680 ml  Output      0 ml  Net   2680 ml   Filed Weights   07/17/15 1050 07/18/15 0357  Weight: 73.029 kg (161 lb) 73.4 kg (161 lb 13.1 oz)    Exam:   General: Middle aged female appears fatigued  HEENT: No pallor, no icterus, moist mucosa, supple neck  Chest: Clear bilaterally, no added sounds  Cardiovascular: S1 and S2 tachycardic, no murmurs rub or gallop  GI: Soft, distended with ascites, nontender, bowel sounds present, mild right flank tenderness  Musculoskeletal: Warm, no edema  CNS: Alert and oriented  Data Reviewed: Basic Metabolic Panel:  Recent Labs Lab 07/17/15 0816 07/17/15 1255 07/18/15 0549  NA 133* 134* 130*  K 5.0 4.4 5.6*  CL  --  100* 100*  CO2 21* 20* 21*  GLUCOSE 73 79 124*  BUN 11.1 11 11   CREATININE 0.7 0.62 0.69  CALCIUM 9.2 7.9* 8.0*   Liver Function Tests:  Recent Labs Lab 07/17/15 0816 07/17/15 1255  AST 492* 412*  ALT 94* 76*  ALKPHOS  316* 262*  BILITOT 0.92 1.4*  PROT 6.4 5.4*  ALBUMIN 2.4* 2.2*   No results for input(s): LIPASE, AMYLASE in the last 168 hours. No results for input(s): AMMONIA in the last 168 hours. CBC:  Recent Labs Lab 07/17/15 0816 07/17/15 1040 07/18/15 0549  WBC 9.1 9.3 11.8*  NEUTROABS 5.8 5.6  --   HGB 12.8 12.9 10.9*  HCT 39.4 40.0 34.9*  MCV 84.2 85.3 86.8  PLT 467* 587* 433*   Cardiac Enzymes: No results for input(s): CKTOTAL, CKMB, CKMBINDEX, TROPONINI in the last 168 hours. BNP (last 3 results) No results for input(s): BNP in the last 8760 hours.  ProBNP (last 3 results) No results for input(s): PROBNP in the last 8760 hours.  CBG: No results for input(s): GLUCAP in the last 168 hours.  Recent Results (from the past 240 hour(s))  Blood Culture (routine x 2)     Status: None (Preliminary result)   Collection Time: 07/17/15 10:40 AM  Result Value Ref Range Status   Specimen Description BLOOD RIGHT ANTECUBITAL  Final   Special Requests BOTTLES DRAWN AEROBIC AND ANAEROBIC 5ML  Final   Culture   Final    NO GROWTH < 24 HOURS Performed at [facility]    Report Status PENDING  Incomplete  Blood Culture (routine x 2)     Status: None (Preliminary result)   Collection Time: 07/17/15 11:00 AM  Result Value Ref Range Status   Specimen Description BLOOD LEFT FOREARM  Final   Special Requests BOTTLES DRAWN AEROBIC ONLY 5ML  Final   Culture   Final    NO GROWTH < 24 HOURS Performed at [facility]    Report Status PENDING  Incomplete  Culture, body fluid-bottle     Status: None (Preliminary result)   Collection Time: 07/17/15  3:47 PM  Result Value Ref Range Status   Specimen Description FLUID PERITONEAL  Final   Special Requests BOTTLES DRAWN AEROBIC AND ANAEROBIC 10CC  Final   Culture   Final    NO GROWTH < 24 HOURS Performed at [facility]    Report Status PENDING  Incomplete  Gram stain     Status: None   Collection Time: 07/17/15  3:47 PM   Result Value Ref Range Status   Specimen Description FLUID PERITONEAL  Final   Special Requests NONE  Final   Gram Stain   Final    CYTOSPIN SMEAR WBC PRESENT,BOTH PMN AND MONONUCLEAR NO ORGANISMS SEEN Gram Stain Report Called to,Read Back By and Verified With: FAITH HASZ RN 1.4.17 @ [id number] BY RICEJ Performed at [facility]    Report Status 07/17/2015 FINAL  Final  MRSA PCR Screening     Status: None   Collection Time: 07/18/15  3:59 AM  Result Value Ref Range Status   MRSA by PCR NEGATIVE NEGATIVE Final    Comment:        The GeneXpert MRSA Assay (FDA approved for NASAL specimens only), is one component of a comprehensive MRSA colonization surveillance program. It is not intended to diagnose MRSA infection nor to guide or monitor treatment for MRSA infections.      Studies: Dg Chest 2 View  07/17/2015  CLINICAL DATA:  Shortness of breath developing over the past few days. EXAM: CHEST  2 VIEW COMPARISON:  04/26/2010 FINDINGS: Hypoventilation without pneumonia or edema. No effusion or pneumothorax. Normal heart size and mediastinal contours. Mild thoracic dextrocurvature, likely accentuated by positioning. IMPRESSION: No active cardiopulmonary disease. Electronically Signed   By: Monte Fantasia M.D.   On: 07/17/2015 11:38   Ct Abdomen Pelvis W Contrast  07/17/2015  CLINICAL DATA:  Left breast cancer. Abdominal swelling and right abdominal pain. EXAM: CT ABDOMEN AND PELVIS WITH CONTRAST TECHNIQUE: Multidetector CT imaging of the abdomen and pelvis was performed using the standard protocol following bolus administration of intravenous contrast. CONTRAST:  116mL OMNIPAQUE IOHEXOL 300 MG/ML  SOLN COMPARISON:  No prior CT abdomen/ pelvis. 06/11/2015 abdominal sonogram. FINDINGS: Lower chest: No significant pulmonary nodules or acute consolidative airspace disease. Oral contrast in the lower thoracic esophagus indicates esophageal dysmotility and/ or gastroesophageal reflux.  Hepatobiliary: The liver is enlarged and largely replaced by innumerable (> 30) confluent heterogeneously hypoenhancing liver masses. Representative liver masses include a 7.1 x 5.4 cm segment 4A left liver lobe mass (series 2/ image 21), a 5.4 x 4.9 cm segment 6 right liver lobe mass (series 2/ image 45) and a 4.9 x 3.8 cm segment 5 right liver lobe mass (series 2/image 48). Status post cholecystectomy. No biliary ductal dilatation. Pancreas: Normal, with no mass or duct dilation. Spleen: Normal size. No mass. Adrenals/Urinary Tract: Normal adrenals. No hydronephrosis and no renal mass. There is mass-effect on right kidney by the enlarged right liver lobe. Normal bladder. Stomach/Bowel: Grossly normal stomach. Normal caliber small bowel with no small bowel wall thickening. Normal appendix. Normal large bowel with no diverticulosis, large bowel wall thickening or pericolonic fat stranding. Vascular/Lymphatic: Normal caliber abdominal aorta. Patent portal, splenic and renal veins. Hepatic veins appear patent and narrowed by surrounding liver masses. No pathologically enlarged lymph nodes in the abdomen or pelvis. Reproductive: Grossly normal uterus.  No adnexal mass. Other: No pneumoperitoneum. Persistent moderate to large volume ascites, predominantly in the pelvis. Musculoskeletal: No aggressive appearing focal osseous lesions. IMPRESSION: 1. Liver is enlarged and largely replaced by extensive confluent metastatic disease. 2. Persistent moderate to large volume ascites, predominantly pelvic. 3. No abdominopelvic lymphadenopathy. 4. Mass-effect on the right kidney by the enlarged right liver lobe. No hydronephrosis. 5. Status post cholecystectomy.  No biliary ductal dilatation. 6. Oral contrast in the lower thoracic esophagus, suggesting esophageal dysmotility and/or gastroesophageal reflux. Electronically Signed   By: Ilona Sorrel M.D.   On: 07/17/2015 17:40   US Paracentesis  07/17/2015  INDICATION: Breast  cancer, ascites. Request is made for diagnostic and therapeutic paracentesis. EXAM: ULTRASOUND-GUIDED DIAGNOSTIC AND THERAPEUTIC PARACENTESIS COMPARISON:  None. MEDICATIONS: None. COMPLICATIONS: None immediate TECHNIQUE: Informed written consent was obtained from the patient after a discussion of the risks, benefits and alternatives to treatment. A timeout was performed prior to the initiation of the procedure. Initial ultrasound scanning demonstrates a large amount of ascites within the right lower abdominal quadrant. The right lower abdomen was prepped and draped in the usual sterile fashion. 1% lidocaine was used for local anesthesia. Under direct ultrasound guidance, a 19 gauge, 7-cm, Yueh catheter was introduced. An ultrasound image was saved for documentation purposed. The paracentesis was performed. The catheter was removed and a dressing was applied. The patient tolerated the procedure well without immediate post procedural complication. FINDINGS: A total of approximately 6 liters of turbid, bloody fluid was removed. Samples were sent to the laboratory as  requested by the clinical team. IMPRESSION: Successful ultrasound-guided diagnostic and therapeutic paracentesis yielding 6 liters of peritoneal fluid. Dr. [name] was notified of the above findings. Read by: Rowe Robert, PA-C Electronically Signed   By: Sandi Mariscal M.D.   On: 07/17/2015 15:37    Scheduled Meds: . acidophilus  1 capsule Oral Daily  . piperacillin-tazobactam (ZOSYN)  IV  3.375 g Intravenous Q8H  . vitamin B-12  100 mcg Oral Daily   Continuous Infusions: . sodium chloride 100 mL/hr at 07/18/15 0542      Time spent: 35 minutes    Dak Szumski  [facility] Pager [phone]. If 7PM-7AM, please contact night-coverage at [email or web address], password [email or web address] 07/18/2015, 12:51 PM  LOS: 1 day

## 2015-07-18 NOTE — Care Management Note (Signed)
Case Management Note  Patient Details  Name: Dawn Haynes MRN: [id number] Date of Birth: 1980-12-31  Subjective/Objective:         Hypovolemia and sepsis           Action/Plan:Date: July 18, 2015 Chart reviewed for concurrent status and case management needs. Will continue to follow patient for changes and needs: Dawn Harman, RN, BSN, [place]   [phone]   Expected Discharge Date:   (unknown)               Expected Discharge Plan:  Home/Self Care  In-House Referral:  NA  Discharge planning Services  CM Consult  Post Acute Care Choice:    Choice offered to:     DME Arranged:    DME Agency:     HH Arranged:    [place] Agency:     Status of Service:  In process, will continue to follow  Medicare Important Message Given:    Date Medicare IM Given:    Medicare IM give by:    Date Additional Medicare IM Given:    Additional Medicare Important Message give by:     If discussed at [place] of Stay Meetings, dates discussed:    Additional Comments:  Leeroy Cha, RN 07/18/2015, 12:01 PM

## 2015-07-19 DIAGNOSIS — C50912 Malignant neoplasm of unspecified site of left female breast: Secondary | ICD-10-CM

## 2015-07-19 DIAGNOSIS — E872 Acidosis: Secondary | ICD-10-CM

## 2015-07-19 DIAGNOSIS — R188 Other ascites: Secondary | ICD-10-CM

## 2015-07-19 DIAGNOSIS — R1011 Right upper quadrant pain: Secondary | ICD-10-CM

## 2015-07-19 DIAGNOSIS — R180 Malignant ascites: Secondary | ICD-10-CM

## 2015-07-19 DIAGNOSIS — C787 Secondary malignant neoplasm of liver and intrahepatic bile duct: Secondary | ICD-10-CM

## 2015-07-19 LAB — CBC
HEMATOCRIT: 34.5 % — AB (ref 36.0–46.0)
HEMOGLOBIN: 10.8 g/dL — AB (ref 12.0–15.0)
MCH: 27.3 pg (ref 26.0–34.0)
MCHC: 31.3 g/dL (ref 30.0–36.0)
MCV: 87.1 fL (ref 78.0–100.0)
Platelets: 457 10*3/uL — ABNORMAL HIGH (ref 150–400)
RBC: 3.96 MIL/uL (ref 3.87–5.11)
RDW: 14.2 % (ref 11.5–15.5)
WBC: 10.8 10*3/uL — AB (ref 4.0–10.5)

## 2015-07-19 LAB — URINE CULTURE: CULTURE: NO GROWTH

## 2015-07-19 LAB — BASIC METABOLIC PANEL
ANION GAP: 10 (ref 5–15)
BUN: 11 mg/dL (ref 6–20)
CALCIUM: 7.6 mg/dL — AB (ref 8.9–10.3)
CHLORIDE: 102 mmol/L (ref 101–111)
CO2: 19 mmol/L — AB (ref 22–32)
Creatinine, Ser: 0.7 mg/dL (ref 0.44–1.00)
GFR calc non Af Amer: >60 mL/min (ref >60)
Glucose, Bld: 118 mg/dL — ABNORMAL HIGH (ref 65–99)
Potassium: 4.2 mmol/L (ref 3.5–5.1)
SODIUM: 131 mmol/L — AB (ref 135–145)

## 2015-07-19 LAB — LACTIC ACID, PLASMA
LACTIC ACID, VENOUS: 4.1 mmol/L — AB (ref 0.5–2.0)
LACTIC ACID, VENOUS: 4.3 mmol/L — AB (ref 0.5–2.0)

## 2015-07-19 MED ORDER — ALBUMIN HUMAN 25 % IV SOLN
50.0000 g | Freq: Once | INTRAVENOUS | Status: AC
  Administered 2015-07-19: 50 g via INTRAVENOUS
  Filled 2015-07-19: qty 50

## 2015-07-19 MED ORDER — FUROSEMIDE 10 MG/ML IJ SOLN
60.0000 mg | Freq: Once | INTRAMUSCULAR | Status: AC
  Administered 2015-07-19: 60 mg via INTRAVENOUS
  Filled 2015-07-19: qty 6

## 2015-07-19 NOTE — Progress Notes (Signed)
HEMATOLOGY-ONCOLOGY PROGRESS NOTE  SUBJECTIVE: Significant improvement in abdominal distention, continues to have pain in the left breast/chest wall mass  OBJECTIVE: PHYSICAL EXAMINATION: ECOG PERFORMANCE STATUS: 3 - Symptomatic, >50% confined to bed  Filed Vitals:   07/19/15 1135 07/19/15 1200  BP: 122/90   Pulse: 118   Temp:  97.5 F (36.4 C)  Resp: 20    Filed Weights   07/17/15 1050 07/18/15 0357  Weight: 161 lb (73.029 kg) 161 lb 13.1 oz (73.4 kg)    GENERAL:alert, no distress and comfortable SKIN: skin color, texture, turgor are normal, no rashes or significant lesions EYES: normal, Conjunctiva are pink and non-injected, sclera clear OROPHARYNX:no exudate, no erythema and lips, buccal mucosa, and tongue normal  NECK: supple, thyroid normal size, non-tender, without nodularity LYMPH:  no palpable lymphadenopathy in the cervical, axillary or inguinal LUNGS: clear to auscultation and percussion with normal breathing effort HEART: Tachycardia ABDOMEN: Moderate distention with hepatomegaly NEURO: alert & oriented x 3 with fluent speech, no focal motor/sensory deficits  LABORATORY DATA:  I have reviewed the data as listed CMP Latest Ref Rng 07/19/2015 07/18/2015 07/18/2015  Glucose 65 - 99 mg/dL 118(H) 105(H) 124(H)  BUN 6 - 20 mg/dL 11 10 11   Creatinine 0.44 - 1.00 mg/dL 0.70 0.63 0.69  Sodium 135 - 145 mmol/L 131(L) 135 130(L)  Potassium 3.5 - 5.1 mmol/L 4.2 4.4 5.6(H)  Chloride 101 - 111 mmol/L 102 105 100(L)  CO2 22 - 32 mmol/L 19(L) 21(L) 21(L)  Calcium 8.9 - 10.3 mg/dL 7.6(L) 7.1(L) 8.0(L)  Total Protein 6.5 - 8.1 g/dL - - -  Total Bilirubin 0.3 - 1.2 mg/dL - - -  Alkaline Phos 38 - 126 U/L - - -  AST 15 - 41 U/L - - -  ALT 14 - 54 U/L - - -    Lab Results  Component Value Date   WBC 10.8* 07/19/2015   HGB 10.8* 07/19/2015   HCT 34.5* 07/19/2015   MCV 87.1 07/19/2015   PLT 457* 07/19/2015   NEUTROABS 5.6 07/17/2015    ASSESSMENT AND PLAN: 1. Metastatic  breast cancer with left breast mass and extensive liver metastases: Ascites status post paracentesis on 07/17/2015 where 6 L of bloody fluid was removed. Cytology did not show metastatic breast cancer but it could very well be a sampling error. Prognosis: I discussed with the patient and her husband at length that metastatic breast cancer with widespread liver involvement with triple negative disease carries a very poor prognosis. If nothing is done I suspect that she would not be alive for more than a few weeks.  2. Treatment plan: We will initiate palliative chemotherapy with Taxotere and Cytoxan Vs Halaven Vs Gemcitabine starting next week. Hopefully the antibiotics will help treat underlying sepsis by then. The challenge with dosing with chemotherapy is that his AST and ALT are markedly elevated. Taxotere, Cytoxan,halaven have to be used with extreme caution and liver dysfunction. Hence our only option might be gemcitabine which does not require dose adjustment for hepatic impairment.  I will request Dr. Dalbert Batman for port placement. Goals of treatment: To prolong her life and to decrease the liver metastases and thereby decrease the amount of ascites.  3. Recurrent ascites: Patient may require frequent paracentesis until we get started with chemotherapy and until she responds to the treatment.

## 2015-07-19 NOTE — Progress Notes (Signed)
Chaplain visit the result of a staff referral  Dawn Haynes received the news that she has stage 4 cancer. This news was stressing and humbling to her. She has talked with physicians about chemo therapy options. She is determined to use any opportunity available to extend her life as long as possible and maintain a quality of life at the highest level possible. She seems realistic in her outlook and is aware that with stage 4 cancer, her life may be very short. She is a person of faith and uses her faith as a stabilizing force in her life. Chaplain discussed with Dawn Moshe keeping her stress levels down, to be open about her goals and ask for assistance during those times she is confused about the best course for herself. Dawn [place] had just taken a pain medication before the chaplain arrived and requested another visit after she is less sleepy.  Follow up chaplain support as needed or requested.  Sallee Lange. Trayshawn Durkin, [place]

## 2015-07-19 NOTE — Progress Notes (Signed)
CRITICAL VALUE ALERT  Critical value received:  Lactic Acid 4.1  Date of notification:  1/61/2016  Time of notification:  0853  Critical value read back:Yes.    Nurse who received alert:  Duard Larsen, RN  MD notified (1st page):  Dr. Clementeen Griffin Gerrard  Time of first page: [phone]

## 2015-07-19 NOTE — Progress Notes (Signed)
[facility]CC[facility] ICU Medical Director  [facility] team asked me to review case due to unexplained rising lactic acid.  Unfortunate situation with a 35 y/o lady suffering from stage IV cancer.  Admitted with ascites fluid, possible sepsis.  Has been in ICU several days but steadily improving, walking around unit on room air, talking on phone. Urine output OK.  Vitals reviewed, not hypotensive.  Mild tachycardia stable.  Repeated lactic acid values being collected per recent system-wide sepsis initiative encouraging physicians to collect lactic acids in setting of sepsis.  This lady does not show signs of shock in that her BP is great and her urine output is elevated.  I reviewed the images from her CT chest which show that her liver is made mostly of metastatic lesions and there is very little normal liver tissue.  LFT's note transaminitis.  I explained to Dr. Valinda Party that I feel the lactic acid abnormality is most likely explained by her liver abnormality and I would avoid aggressive volume resuscitation as this would likely cause harm.  Would not collect more lactic acid values unless clinically indicated by shock or other signs of poor perfusion.  PCCM will be available if needed.  Agree with SDU monitoring for now  Roselie Awkward, MD [place] [facility] Pager: [phone] Cell: [phone] After 3pm or if no response, call [phone]

## 2015-07-19 NOTE — Progress Notes (Signed)
[facility] PROGRESS NOTE  Dawn Haynes [id number] DOB: [birth year] DOA: 07/17/2015 PCP: Mora Bellman, MD  Brief narrative 35 year old female with recently diagnosed left breast cancer. she felt a lump in her left upper outer quadrant for almost 1 year which is slowly increasing in size and becoming painful. A mammogram and ultrasound showed 5.3 cm mass with abnormal left axillary lymph nodes. Biopsy was positive for high-grade triple negative breast cancer. She was presented to the tumor board on 1/4 and then saw her oncologist Dr. [name] the same day. She was found to be tachycardic and tachypneic with abdominal distention (which she informs for the past 1 month). She was then sent to the ED for further management and paracentesis. ED she underwent diagnostic and therapeutic paracentesis with about 6 L bloody fluid removed. It showed about 20,000 white cells. She was septic with tachycardia, tachypnea and low blood pressure with markedly elevated lactic acid (5.68) She was placed on IV vancomycin and Zosyn concern for SBP and sepsis and admitted to stepdown unit.  Assessment/Plan: Sepsis Likely secondary to SBP versus high tumor burden with liver metastases and malignant ascites. Continue step down monitoring. Received aggressive hydration for persistent lactic acid elevation. Discussed with PC CM and persistent lactic acid elevation is likely due to her liver failure. Will avoid further lactic acid monitoring and discontinue IV hydration. I will give her a dose of IV Lasix. -Supportive care with pain medication (when necessary IV Dilaudid and oxycodone) and antiemetics. -Empiric vancomycin and Zosyn. Follow ascites fluid culture and cytology.    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 CT abdomen and pelvis done on 1/4 showing enlarged liver largely replaced by extensive confluent metastatic disease with large volume ascites mainly  in the pelvic area. Also has mass effect on the right kidney by and large right liver lobe. Dr. [name] is aware of patient's hospitalization. Awaiting ascites fluid cytology results. Supportive care in the meantime. Patient has significant the accommodation of the ascites fluid today. Order for repeat ultrasound paracentesis. Ordered IV albumin as well.  Hyperkalemia Resolved.  Protein calorie malnutrition Dietitian consulted  DVT prophylaxis: Subcutaneous Lovenox Diet: Regular   Code Status: Full code Family Communication: Husband at bedside Disposition Plan: Continue step down monitoring   Consultants:  Dr. [name]  PCCM ( will follow as needed)  Procedures:  CT abdomen and pelvis  Large volume paracentesis on 1/4  Antibiotics: IV vancomycin and Zosyn since 1/4   HPI/Subjective: Seen and examined. Patient is very anxious about her disease condition and repeatedly ask about treatment plan, without not she has metastatic disease and when she will have a Port-A-Cath placed in. Husband at bedside. All questions answered in detail.  Objective: Filed Vitals:   07/19/15 0800 07/19/15 0820  BP:  120/90  Pulse:  116  Temp: 97.3 F (36.3 C)   Resp:  18    Intake/Output Summary (Last 24 hours) at 07/19/15 1215 Last data filed at 07/19/15 1142  Gross per 24 hour  Intake   3650 ml  Output    600 ml  Net   3050 ml   Filed Weights   07/17/15 1050 07/18/15 0357  Weight: 73.029 kg (161 lb) 73.4 kg (161 lb 13.1 oz)    Exam:   General: Middle aged female appears fatigued  HEENT: No pallor, no icterus, moist mucosa, supple neck  Chest: Clear bilaterally, no added sounds  Cardiovascular: S1 and S2 tachycardic, no murmurs rub or gallop  GI: Soft, distended with ascites (increased), it upper quadrant tenderness, bowel sounds present, mild right flank tenderness  Musculoskeletal: Warm, trace edema bilaterally  CNS: Alert and oriented  Data Reviewed: Basic  Metabolic Panel:  Recent Labs Lab 07/17/15 0816 07/17/15 1255 07/18/15 0549 07/18/15 1823 07/19/15 0340  NA 133* 134* 130* 135 131*  K 5.0 4.4 5.6* 4.4 4.2  CL  --  100* 100* 105 102  CO2 21* 20* 21* 21* 19*  GLUCOSE 73 79 124* 105* 118*  BUN 11.1 11 11 10 11   CREATININE 0.7 0.62 0.69 0.63 0.70  CALCIUM 9.2 7.9* 8.0* 7.1* 7.6*   Liver Function Tests:  Recent Labs Lab 07/17/15 0816 07/17/15 1255  AST 492* 412*  ALT 94* 76*  ALKPHOS 316* 262*  BILITOT 0.92 1.4*  PROT 6.4 5.4*  ALBUMIN 2.4* 2.2*   No results for input(s): LIPASE, AMYLASE in the last 168 hours. No results for input(s): AMMONIA in the last 168 hours. CBC:  Recent Labs Lab 07/17/15 0816 07/17/15 1040 07/18/15 0549 07/19/15 0340  WBC 9.1 9.3 11.8* 10.8*  NEUTROABS 5.8 5.6  --   --   HGB 12.8 12.9 10.9* 10.8*  HCT 39.4 40.0 34.9* 34.5*  MCV 84.2 85.3 86.8 87.1  PLT 467* 587* 433* 457*   Cardiac Enzymes: No results for input(s): CKTOTAL, CKMB, CKMBINDEX, TROPONINI in the last 168 hours. BNP (last 3 results) No results for input(s): BNP in the last 8760 hours.  ProBNP (last 3 results) No results for input(s): PROBNP in the last 8760 hours.  CBG: No results for input(s): GLUCAP in the last 168 hours.  Recent Results (from the past 240 hour(s))  Blood Culture (routine x 2)     Status: None (Preliminary result)   Collection Time: 07/17/15 10:40 AM  Result Value Ref Range Status   Specimen Description BLOOD RIGHT ANTECUBITAL  Final   Special Requests BOTTLES DRAWN AEROBIC AND ANAEROBIC 5ML  Final   Culture   Final    NO GROWTH < 24 HOURS Performed at [facility]    Report Status PENDING  Incomplete  Blood Culture (routine x 2)     Status: None (Preliminary result)   Collection Time: 07/17/15 11:00 AM  Result Value Ref Range Status   Specimen Description BLOOD LEFT FOREARM  Final   Special Requests BOTTLES DRAWN AEROBIC ONLY 5ML  Final   Culture   Final    NO GROWTH < 24  HOURS Performed at [facility]    Report Status PENDING  Incomplete  Culture, body fluid-bottle     Status: None (Preliminary result)   Collection Time: 07/17/15  3:47 PM  Result Value Ref Range Status   Specimen Description FLUID PERITONEAL  Final   Special Requests BOTTLES DRAWN AEROBIC AND ANAEROBIC 10CC  Final   Culture   Final    NO GROWTH < 24 HOURS Performed at [facility]    Report Status PENDING  Incomplete  Gram stain     Status: None   Collection Time: 07/17/15  3:47 PM  Result Value Ref Range Status   Specimen Description FLUID PERITONEAL  Final   Special Requests NONE  Final   Gram Stain   Final    CYTOSPIN SMEAR WBC PRESENT,BOTH PMN AND MONONUCLEAR NO ORGANISMS SEEN Gram Stain Report Called to,Read Back By and Verified With: Orbie Pyo RN 1.4.17 @ [id number] BY RICEJ Performed at [facility]    Report Status 07/17/2015 FINAL  Final  Urine culture     Status: None   Collection Time: 07/17/15  8:09 PM  Result Value Ref Range Status   Specimen Description URINE, CLEAN CATCH  Final   Special Requests NONE  Final   Culture   Final    NO GROWTH 1 DAY Performed at [facility]    Report Status 07/19/2015 FINAL  Final  MRSA PCR Screening     Status: None   Collection Time: 07/18/15  3:59 AM  Result Value Ref Range Status   MRSA by PCR NEGATIVE NEGATIVE Final    Comment:        The GeneXpert MRSA Assay (FDA approved for NASAL specimens only), is one component of a comprehensive MRSA colonization surveillance program. It is not intended to diagnose MRSA infection nor to guide or monitor treatment for MRSA infections.      Studies: Ct Abdomen Pelvis W Contrast  07/17/2015  CLINICAL DATA:  Left breast cancer. Abdominal swelling and right abdominal pain. EXAM: CT ABDOMEN AND PELVIS WITH CONTRAST TECHNIQUE: Multidetector CT imaging of the abdomen and pelvis was performed using the standard protocol following bolus administration of  intravenous contrast. CONTRAST:  128mL OMNIPAQUE IOHEXOL 300 MG/ML  SOLN COMPARISON:  No prior CT abdomen/ pelvis. 06/11/2015 abdominal sonogram. FINDINGS: Lower chest: No significant pulmonary nodules or acute consolidative airspace disease. Oral contrast in the lower thoracic esophagus indicates esophageal dysmotility and/ or gastroesophageal reflux. Hepatobiliary: The liver is enlarged and largely replaced by innumerable (> 30) confluent heterogeneously hypoenhancing liver masses. Representative liver masses include a 7.1 x 5.4 cm segment 4A left liver lobe mass (series 2/ image 21), a 5.4 x 4.9 cm segment 6 right liver lobe mass (series 2/ image 45) and a 4.9 x 3.8 cm segment 5 right liver lobe mass (series 2/image 48). Status post cholecystectomy. No biliary ductal dilatation. Pancreas: Normal, with no mass or duct dilation. Spleen: Normal size. No mass. Adrenals/Urinary Tract: Normal adrenals. No hydronephrosis and no renal mass. There is mass-effect on right kidney by the enlarged right liver lobe. Normal bladder. Stomach/Bowel: Grossly normal stomach. Normal caliber small bowel with no small bowel wall thickening. Normal appendix. Normal large bowel with no diverticulosis, large bowel wall thickening or pericolonic fat stranding. Vascular/Lymphatic: Normal caliber abdominal aorta. Patent portal, splenic and renal veins. Hepatic veins appear patent and narrowed by surrounding liver masses. No pathologically enlarged lymph nodes in the abdomen or pelvis. Reproductive: Grossly normal uterus.  No adnexal mass. Other: No pneumoperitoneum. Persistent moderate to large volume ascites, predominantly in the pelvis. Musculoskeletal: No aggressive appearing focal osseous lesions. IMPRESSION: 1. Liver is enlarged and largely replaced by extensive confluent metastatic disease. 2. Persistent moderate to large volume ascites, predominantly pelvic. 3. No abdominopelvic lymphadenopathy. 4. Mass-effect on the right kidney by  the enlarged right liver lobe. No hydronephrosis. 5. Status post cholecystectomy.  No biliary ductal dilatation. 6. Oral contrast in the lower thoracic esophagus, suggesting esophageal dysmotility and/or gastroesophageal reflux. Electronically Signed   By: Ilona Sorrel M.D.   On: 07/17/2015 17:40   US Paracentesis  07/17/2015  INDICATION: Breast cancer, ascites. Request is made for diagnostic and therapeutic paracentesis. EXAM: ULTRASOUND-GUIDED DIAGNOSTIC AND THERAPEUTIC PARACENTESIS COMPARISON:  None. MEDICATIONS: None. COMPLICATIONS: None immediate TECHNIQUE: Informed written consent was obtained from the patient after a discussion of the risks, benefits and alternatives to treatment. A timeout was performed prior to the initiation of the procedure. Initial ultrasound scanning demonstrates a large amount of ascites within the right lower abdominal  quadrant. The right lower abdomen was prepped and draped in the usual sterile fashion. 1% lidocaine was used for local anesthesia. Under direct ultrasound guidance, a 19 gauge, 7-cm, Yueh catheter was introduced. An ultrasound image was saved for documentation purposed. The paracentesis was performed. The catheter was removed and a dressing was applied. The patient tolerated the procedure well without immediate post procedural complication. FINDINGS: A total of approximately 6 liters of turbid, bloody fluid was removed. Samples were sent to the laboratory as requested by the clinical team. IMPRESSION: Successful ultrasound-guided diagnostic and therapeutic paracentesis yielding 6 liters of peritoneal fluid. Dr. [name] was notified of the above findings. Read by: Rowe Robert, PA-C Electronically Signed   By: Sandi Mariscal M.D.   On: 07/17/2015 15:37    Scheduled Meds: . acidophilus  1 capsule Oral Daily  . piperacillin-tazobactam (ZOSYN)  IV  3.375 g Intravenous Q8H  . vitamin B-12  100 mcg Oral Daily   Continuous Infusions:      Time spent: 35  minutes    Dreanna Kyllo, [place]  [facility] Pager [phone]. If 7PM-7AM, please contact night-coverage at [email or web address], password [email or web address] 07/19/2015, 12:15 PM  LOS: 2 days

## 2015-07-20 ENCOUNTER — Inpatient Hospital Stay (HOSPITAL_COMMUNITY): Payer: Medicaid Other

## 2015-07-20 LAB — COMPREHENSIVE METABOLIC PANEL
ALT: 156 U/L — ABNORMAL HIGH (ref 14–54)
ANION GAP: 13 (ref 5–15)
AST: 495 U/L — ABNORMAL HIGH (ref 15–41)
Albumin: 2.8 g/dL — ABNORMAL LOW (ref 3.5–5.0)
Alkaline Phosphatase: 383 U/L — ABNORMAL HIGH (ref 38–126)
BILIRUBIN TOTAL: 1.7 mg/dL — AB (ref 0.3–1.2)
BUN: 13 mg/dL (ref 6–20)
CALCIUM: 8.5 mg/dL — AB (ref 8.9–10.3)
CO2: 20 mmol/L — ABNORMAL LOW (ref 22–32)
Chloride: 98 mmol/L — ABNORMAL LOW (ref 101–111)
Creatinine, Ser: 0.62 mg/dL (ref 0.44–1.00)
Glucose, Bld: 115 mg/dL — ABNORMAL HIGH (ref 65–99)
POTASSIUM: 4.3 mmol/L (ref 3.5–5.1)
Sodium: 131 mmol/L — ABNORMAL LOW (ref 135–145)
TOTAL PROTEIN: 5.5 g/dL — AB (ref 6.5–8.1)

## 2015-07-20 MED ORDER — ALBUMIN HUMAN 25 % IV SOLN
37.5000 g | Freq: Once | INTRAVENOUS | Status: AC
  Administered 2015-07-20: 37.5 g via INTRAVENOUS
  Filled 2015-07-20: qty 150

## 2015-07-20 MED ORDER — SENNOSIDES-DOCUSATE SODIUM 8.6-50 MG PO TABS
2.0000 | ORAL_TABLET | Freq: Every day | ORAL | Status: DC
  Administered 2015-07-20 – 2015-07-29 (×8): 2 via ORAL
  Filled 2015-07-20 (×9): qty 2

## 2015-07-20 NOTE — Care Management Note (Signed)
Case Management Note  Patient Details  Name: Dawn Haynes MRN: [id number] Date of Birth: [birth year]  Subjective/Objective:       Received referral stating pt wanted to apply for Social Security Disability benefits.  Called to room and explained to spouse that pt could go to [email or web address] to complete disability application online or could apply by telephone, number also provided on website [email or web address].  Spouse demonstrates understanding.                        Expected Discharge Plan:  Home/Self Care Discharge planning Services  CM Consult  Status of Service:  In process, will continue to follow  Girard Cooter, RN 07/20/2015, 12:21 PM

## 2015-07-20 NOTE — Progress Notes (Signed)
Agree with ICU RN's shift assessment from this morning.  Lind Guest, RN

## 2015-07-20 NOTE — Procedures (Signed)
RLQ paracentesis No comp/EBL 

## 2015-07-20 NOTE — Progress Notes (Signed)
PROGRESS NOTE  Dawn Haynes [id number] DOB: 1981-01-09 DOA: 07/17/2015 PCP: Mora Bellman, MD  Brief narrative 35 year old female with recently diagnosed left breast cancer. she felt a lump in her left upper outer quadrant for almost 1 year which is slowly increasing in size and becoming painful. A mammogram and ultrasound showed 5.3 cm mass with abnormal left axillary lymph nodes. Biopsy was positive for high-grade triple negative breast cancer. She was presented to the tumor board on 1/4 and then saw her oncologist Dr. [name] the same day. She was found to be tachycardic and tachypneic with abdominal distention (which she informs for the past 1 month). She was then sent to the ED for further management and paracentesis. ED she underwent diagnostic and therapeutic paracentesis with about 6 L bloody fluid removed. It showed about 20,000 white cells. She was septic with tachycardia, tachypnea and low blood pressure with markedly elevated lactic acid (5.68) She was placed on IV vancomycin and Zosyn concern for SBP and sepsis and admitted to stepdown unit.  HPI/Subjective: - no complaints this morning, endorses abdominal pain and distention, overall improved. Has a poor appetite.   Assessment/Plan:  Sepsis - Likely secondary to SBP versus SIRS due to high tumor burden with liver metastases and malignant ascites, would favor SBP given elevated WBC in the ascitic fluid - Received aggressive hydration for persistent lactic acid elevation. Dr. Clementeen Graham discussed with PCCM and persistent lactic acid elevation is likely due to her liver failure. Will avoid further lactic acid monitoring and discontinue IV hydration.  - s/p Lasix x 1 - Supportive care with pain medication (when necessary IV Dilaudid and oxycodone) and antiemetics. - Empiric vancomycin and Zosyn. Follow ascites fluid culture and cytology.  Metastatic left breast cancer - Grade 3 invasive ductal carcinoma  per pathology, Triple  negative. CT abdomen and pelvis done on 1/4 showing enlarged liver largely replaced by extensive confluent metastatic disease with large volume ascites mainly in the pelvic area. Also has mass effect on the right kidney by and large right liver lobe. - Dr. [name] consulted, discussed with patient on 1/6 - Supportive care in the meantime.  - s/p paracentesis with 6L removal on 1/4. S/p IV albumin - Order for repeat ultrasound paracentesis.  Hyperkalemia - Resolved.  Hyponatremia - in the setting of fluid overload, monitor  Protein calorie malnutrition - Dietitian consulted  Elevated LFTs  - monitor, due to metastases   Sinus tachycardia - likely in the setting of extensive metastatic disease, she is asymptomatic.  DVT prophylaxis: Subcutaneous Lovenox Diet: Regular   Code Status: Full code Family Communication: Husband at bedside Disposition Plan: transfer to telemetry    Consultants:  Dr. [name]  PCCM ( will follow as needed)  Procedures:  CT abdomen and pelvis  Large volume paracentesis on 1/4  Antibiotics: IV vancomycin and Zosyn since 1/4     Objective: Filed Vitals:   07/20/15 0500 07/20/15 0600  BP:    Pulse: 120 161  Temp:    Resp: 19 20    Intake/Output Summary (Last 24 hours) at 07/20/15 0701 Last data filed at 07/20/15 0427  Gross per 24 hour  Intake    850 ml  Output   1100 ml  Net   -250 ml   Filed Weights   07/17/15 1050 07/18/15 0357 07/20/15 0424  Weight: 73.029 kg (161 lb) 73.4 kg (161 lb 13.1 oz) 82.5 kg (181 lb 14.1 oz)   Exam:  General: Middle aged female appears fatigued  HEENT:  No pallor, no icterus, moist mucosa, supple neck  Chest: Clear bilaterally, no added sounds  Cardiovascular: S1 and S2 tachycardic, no murmurs rub or gallop  GI: Soft, distended with ascites (increased), it upper quadrant tenderness, bowel sounds present, mild right flank tenderness  Musculoskeletal: Warm, trace edema bilaterally  CNS: Alert  and oriented  Data Reviewed: Basic Metabolic Panel:  Recent Labs Lab 07/17/15 1255 07/18/15 0549 07/18/15 1823 07/19/15 0340 07/20/15 0334  NA 134* 130* 135 131* 131*  K 4.4 5.6* 4.4 4.2 4.3  CL 100* 100* 105 102 98*  CO2 20* 21* 21* 19* 20*  GLUCOSE 79 124* 105* 118* 115*  BUN 11 11 10 11 13   CREATININE 0.62 0.69 0.63 0.70 0.62  CALCIUM 7.9* 8.0* 7.1* 7.6* 8.5*   Liver Function Tests:  Recent Labs Lab 07/17/15 0816 07/17/15 1255 07/20/15 0334  AST 492* 412* 495*  ALT 94* 76* 156*  ALKPHOS 316* 262* 383*  BILITOT 0.92 1.4* 1.7*  PROT 6.4 5.4* 5.5*  ALBUMIN 2.4* 2.2* 2.8*   CBC:  Recent Labs Lab 07/17/15 0816 07/17/15 1040 07/18/15 0549 07/19/15 0340  WBC 9.1 9.3 11.8* 10.8*  NEUTROABS 5.8 5.6  --   --   HGB 12.8 12.9 10.9* 10.8*  HCT 39.4 40.0 34.9* 34.5*  MCV 84.2 85.3 86.8 87.1  PLT 467* 587* 433* 457*    Recent Results (from the past 240 hour(s))  Blood Culture (routine x 2)     Status: None (Preliminary result)   Collection Time: 07/17/15 10:40 AM  Result Value Ref Range Status   Specimen Description BLOOD RIGHT ANTECUBITAL  Final   Special Requests BOTTLES DRAWN AEROBIC AND ANAEROBIC 5ML  Final   Culture   Final    NO GROWTH 2 DAYS Performed at [facility]    Report Status PENDING  Incomplete  Blood Culture (routine x 2)     Status: None (Preliminary result)   Collection Time: 07/17/15 11:00 AM  Result Value Ref Range Status   Specimen Description BLOOD LEFT FOREARM  Final   Special Requests BOTTLES DRAWN AEROBIC ONLY 5ML  Final   Culture   Final    NO GROWTH 2 DAYS Performed at [facility]    Report Status PENDING  Incomplete  Culture, body fluid-bottle     Status: None (Preliminary result)   Collection Time: 07/17/15  3:47 PM  Result Value Ref Range Status   Specimen Description FLUID PERITONEAL  Final   Special Requests BOTTLES DRAWN AEROBIC AND ANAEROBIC 10CC  Final   Culture   Final    NO GROWTH 2 DAYS Performed  at [facility]    Report Status PENDING  Incomplete  Gram stain     Status: None   Collection Time: 07/17/15  3:47 PM  Result Value Ref Range Status   Specimen Description FLUID PERITONEAL  Final   Special Requests NONE  Final   Gram Stain   Final    CYTOSPIN SMEAR WBC PRESENT,BOTH PMN AND MONONUCLEAR NO ORGANISMS SEEN Gram Stain Report Called to,Read Back By and Verified With: Orbie Pyo RN 1.4.17 @ [id number] BY RICEJ Performed at [facility]    Report Status 07/17/2015 FINAL  Final  Urine culture     Status: None   Collection Time: 07/17/15  8:09 PM  Result Value Ref Range Status   Specimen Description URINE, CLEAN CATCH  Final   Special Requests NONE  Final   Culture   Final  NO GROWTH 1 DAY Performed at [facility]    Report Status 07/19/2015 FINAL  Final  MRSA PCR Screening     Status: None   Collection Time: 07/18/15  3:59 AM  Result Value Ref Range Status   MRSA by PCR NEGATIVE NEGATIVE Final    Comment:        The GeneXpert MRSA Assay (FDA approved for NASAL specimens only), is one component of a comprehensive MRSA colonization surveillance program. It is not intended to diagnose MRSA infection nor to guide or monitor treatment for MRSA infections.     Studies: No results found.  Scheduled Meds: . acidophilus  1 capsule Oral Daily  . piperacillin-tazobactam (ZOSYN)  IV  3.375 g Intravenous Q8H  . senna-docusate  2 tablet Oral Daily  . vitamin B-12  100 mcg Oral Daily   Continuous Infusions:     Marzetta Board  [facility] Pager [phone]. If 7PM-7AM, please contact night-coverage at [email or web address], password [email or web address] 07/20/2015, 7:01 AM  LOS: 3 days

## 2015-07-21 LAB — CBC
HCT: 34.2 % — ABNORMAL LOW (ref 36.0–46.0)
Hemoglobin: 11.1 g/dL — ABNORMAL LOW (ref 12.0–15.0)
MCH: 27.4 pg (ref 26.0–34.0)
MCHC: 32.5 g/dL (ref 30.0–36.0)
MCV: 84.4 fL (ref 78.0–100.0)
PLATELETS: 444 10*3/uL — AB (ref 150–400)
RBC: 4.05 MIL/uL (ref 3.87–5.11)
RDW: 14.1 % (ref 11.5–15.5)
WBC: 13.8 10*3/uL — AB (ref 4.0–10.5)

## 2015-07-21 LAB — COMPREHENSIVE METABOLIC PANEL
ALT: 164 U/L — AB (ref 14–54)
AST: 536 U/L — AB (ref 15–41)
Albumin: 3 g/dL — ABNORMAL LOW (ref 3.5–5.0)
Alkaline Phosphatase: 359 U/L — ABNORMAL HIGH (ref 38–126)
Anion gap: 12 (ref 5–15)
BUN: 15 mg/dL (ref 6–20)
CHLORIDE: 96 mmol/L — AB (ref 101–111)
CO2: 21 mmol/L — AB (ref 22–32)
CREATININE: 0.6 mg/dL (ref 0.44–1.00)
Calcium: 8.9 mg/dL (ref 8.9–10.3)
GFR calc Af Amer: >60 mL/min (ref >60)
GFR calc non Af Amer: >60 mL/min (ref >60)
GLUCOSE: 98 mg/dL (ref 65–99)
Potassium: 4.7 mmol/L (ref 3.5–5.1)
SODIUM: 129 mmol/L — AB (ref 135–145)
Total Bilirubin: 2.2 mg/dL — ABNORMAL HIGH (ref 0.3–1.2)
Total Protein: 5.3 g/dL — ABNORMAL LOW (ref 6.5–8.1)

## 2015-07-21 LAB — TSH: TSH: 1.361 u[IU]/mL (ref 0.350–4.500)

## 2015-07-21 MED ORDER — FUROSEMIDE 10 MG/ML IJ SOLN
40.0000 mg | Freq: Once | INTRAMUSCULAR | Status: AC
  Administered 2015-07-21: 40 mg via INTRAVENOUS
  Filled 2015-07-21: qty 4

## 2015-07-21 NOTE — Progress Notes (Signed)
PROGRESS NOTE  Dawn Haynes [id number] DOB: 03-Oct-1980 DOA: 07/17/2015 PCP: Mora Bellman, MD  Brief narrative 35 year old female with recently diagnosed left breast cancer. she felt a lump in her left upper outer quadrant for almost 1 year which is slowly increasing in size and becoming painful. A mammogram and ultrasound showed 5.3 cm mass with abnormal left axillary lymph nodes. Biopsy was positive for high-grade triple negative breast cancer. She was presented to the tumor board on 1/4 and then saw her oncologist Dr. [name] the same day. She was found to be tachycardic and tachypneic with abdominal distention (which she informs for the past 1 month). She was then sent to the ED for further management and paracentesis. ED she underwent diagnostic and therapeutic paracentesis with about 6 L bloody fluid removed. It showed about 20,000 white cells. She was septic with tachycardia, tachypnea and low blood pressure with markedly elevated lactic acid (5.68) She was placed on IV vancomycin and Zosyn concern for SBP and sepsis and admitted to stepdown unit.  HPI/Subjective: - s/p repeat paracentesis yesterday with 5.7 L removed. Fells a bit better after fluid removal, less abdominal distention but has more RUQ pain. - no palpitations / chest pain  Assessment/Plan:  Sepsis - Likely secondary to SBP versus SIRS due to high tumor burden with liver metastases and malignant ascites, would favor SBP given elevated WBC in the ascitic fluid - Received aggressive hydration for persistent lactic acid elevation. Dr. Clementeen Graham discussed with PCCM and persistent lactic acid elevation is likely due to her liver failure. Will avoid further lactic acid monitoring and discontinue IV hydration.  - s/p Lasix x 1 - Supportive care with pain medication (when necessary IV Dilaudid and oxycodone) and antiemetics. - Empiric Zosyn. Follow ascites fluid culture and cytology. If remain negative can probably narrow to  Ciprofloxacin.  Metastatic left breast cancer - Grade 3 invasive ductal carcinoma  per pathology, Triple negative. CT abdomen and pelvis done on 1/4 showing enlarged liver largely replaced by extensive confluent metastatic disease with large volume ascites mainly in the pelvic area. Also has mass effect on the right kidney by and large right liver lobe. - Dr. [name] consulted, discussed with patient on 1/6, plans for port placement and starting chemotherapy in 2-3 days  - Supportive care in the meantime.  - s/p paracentesis with 6L removal on 1/4 and 5.7 L removal on 1/7. S/p IV albumin x 2.  Hyperkalemia - Resolved. - K 4.7 this morning, continue to monitor  Hyponatremia - in the setting of fluid overload, monitor. Worsening today, likely related to albumin administration yesterday - s/p 60 mg IV Lasix on 1/6, repeat Lasix 1/8. Weight up and she is net positive 5 L  Protein calorie malnutrition - Dietitian consulted  Elevated LFTs  - monitor, due to metastases  - overall stable  Sinus tachycardia - likely in the setting of extensive metastatic disease, she is asymptomatic. - TSH checked and returned normal  DVT prophylaxis: Subcutaneous Lovenox Diet: Regular   Code Status: Full code Family Communication: Husband at bedside Disposition Plan: transfer to telemetry    Consultants:  Dr. [name]  PCCM ( will follow as needed)  Procedures:  CT abdomen and pelvis  Large volume paracentesis on 1/4  Antibiotics: IV Zosyn since 1/4     Objective: Filed Vitals:   07/21/15 0204 07/21/15 0552  BP: 117/80 114/77  Pulse: 117 121  Temp: 98 F (36.7 C) 97.9 F (36.6 C)  Resp: 20 20  Intake/Output Summary (Last 24 hours) at 07/21/15 1042 Last data filed at 07/21/15 0404  Gross per 24 hour  Intake    660 ml  Output      0 ml  Net    660 ml   Filed Weights   07/17/15 1050 07/18/15 0357 07/20/15 0424  Weight: 73.029 kg (161 lb) 73.4 kg (161 lb 13.1 oz) 82.5 kg  (181 lb 14.1 oz)   Exam:  General: Middle aged female appears fatigued  HEENT: No pallor, no icterus, moist mucosa, supple neck  Chest: Clear bilaterally, no added sounds  Cardiovascular: S1 and S2 tachycardic, no murmurs rub or gallop. tachycardic  GI: Soft, distension improved, R upper quadrant tenderness, bowel sounds present  Musculoskeletal: Warm, trace edema bilaterally  CNS: Alert and oriented  Data Reviewed: Basic Metabolic Panel:  Recent Labs Lab 07/18/15 0549 07/18/15 1823 07/19/15 0340 07/20/15 0334 07/21/15 0500  NA 130* 135 131* 131* 129*  K 5.6* 4.4 4.2 4.3 4.7  CL 100* 105 102 98* 96*  CO2 21* 21* 19* 20* 21*  GLUCOSE 124* 105* 118* 115* 98  BUN 11 10 11 13 15   CREATININE 0.69 0.63 0.70 0.62 0.60  CALCIUM 8.0* 7.1* 7.6* 8.5* 8.9   Liver Function Tests:  Recent Labs Lab 07/17/15 0816 07/17/15 1255 07/20/15 0334 07/21/15 0500  AST 492* 412* 495* 536*  ALT 94* 76* 156* 164*  ALKPHOS 316* 262* 383* 359*  BILITOT 0.92 1.4* 1.7* 2.2*  PROT 6.4 5.4* 5.5* 5.3*  ALBUMIN 2.4* 2.2* 2.8* 3.0*   CBC:  Recent Labs Lab 07/17/15 0816 07/17/15 1040 07/18/15 0549 07/19/15 0340 07/21/15 0500  WBC 9.1 9.3 11.8* 10.8* 13.8*  NEUTROABS 5.8 5.6  --   --   --   HGB 12.8 12.9 10.9* 10.8* 11.1*  HCT 39.4 40.0 34.9* 34.5* 34.2*  MCV 84.2 85.3 86.8 87.1 84.4  PLT 467* 587* 433* 457* 444*    Recent Results (from the past 240 hour(s))  Blood Culture (routine x 2)     Status: None (Preliminary result)   Collection Time: 07/17/15 10:40 AM  Result Value Ref Range Status   Specimen Description BLOOD RIGHT ANTECUBITAL  Final   Special Requests BOTTLES DRAWN AEROBIC AND ANAEROBIC 5ML  Final   Culture   Final    NO GROWTH 3 DAYS Performed at [facility]    Report Status PENDING  Incomplete  Blood Culture (routine x 2)     Status: None (Preliminary result)   Collection Time: 07/17/15 11:00 AM  Result Value Ref Range Status   Specimen Description  BLOOD LEFT FOREARM  Final   Special Requests BOTTLES DRAWN AEROBIC ONLY 5ML  Final   Culture   Final    NO GROWTH 3 DAYS Performed at [facility]    Report Status PENDING  Incomplete  Culture, body fluid-bottle     Status: None (Preliminary result)   Collection Time: 07/17/15  3:47 PM  Result Value Ref Range Status   Specimen Description FLUID PERITONEAL  Final   Special Requests BOTTLES DRAWN AEROBIC AND ANAEROBIC 10CC  Final   Culture   Final    NO GROWTH 3 DAYS Performed at [facility]    Report Status PENDING  Incomplete  Gram stain     Status: None   Collection Time: 07/17/15  3:47 PM  Result Value Ref Range Status   Specimen Description FLUID PERITONEAL  Final   Special Requests NONE  Final   Gram  Stain   Final    CYTOSPIN SMEAR WBC PRESENT,BOTH PMN AND MONONUCLEAR NO ORGANISMS SEEN Gram Stain Report Called to,Read Back By and Verified With: FAITH HASZ RN 1.4.17 @ [id number] BY RICEJ Performed at [facility]    Report Status 07/17/2015 FINAL  Final  Urine culture     Status: None   Collection Time: 07/17/15  8:09 PM  Result Value Ref Range Status   Specimen Description URINE, CLEAN CATCH  Final   Special Requests NONE  Final   Culture   Final    NO GROWTH 1 DAY Performed at [facility]    Report Status 07/19/2015 FINAL  Final  MRSA PCR Screening     Status: None   Collection Time: 07/18/15  3:59 AM  Result Value Ref Range Status   MRSA by PCR NEGATIVE NEGATIVE Final    Comment:        The GeneXpert MRSA Assay (FDA approved for NASAL specimens only), is one component of a comprehensive MRSA colonization surveillance program. It is not intended to diagnose MRSA infection nor to guide or monitor treatment for MRSA infections.     Studies: US Paracentesis  07/20/2015  CLINICAL DATA:  Ascites EXAM: ULTRASOUND GUIDED PARACENTESIS TECHNIQUE: Survey ultrasound of the abdomen was performed and an appropriate skin entry site in the right  lower abdomen was selected. Skin site was marked, prepped with Betadine, and draped in usual sterile fashion, and infiltrated locally with 1% lidocaine. A 5 French multisidehole Yueh sheath needle was advanced into the peritoneal space until fluid could be aspirated. The sheath was advanced and the needle removed. 5.7 L of serosanguineous ascites were aspirated. COMPLICATIONS: None IMPRESSION: Technically successful ultrasound guided paracentesis, removing 5.7 L of ascites. Electronically Signed   By: Marybelle Killings M.D.   On: 07/20/2015 12:06    Scheduled Meds: . acidophilus  1 capsule Oral Daily  . piperacillin-tazobactam (ZOSYN)  IV  3.375 g Intravenous Q8H  . senna-docusate  2 tablet Oral Daily  . vitamin B-12  100 mcg Oral Daily   Continuous Infusions:     Marzetta Board  [facility] Pager [phone]. If 7PM-7AM, please contact night-coverage at [email or web address], password [email or web address] 07/21/2015, 10:42 AM  LOS: 4 days

## 2015-07-22 DIAGNOSIS — R160 Hepatomegaly, not elsewhere classified: Secondary | ICD-10-CM

## 2015-07-22 LAB — CULTURE, BLOOD (ROUTINE X 2)
CULTURE: NO GROWTH
CULTURE: NO GROWTH

## 2015-07-22 LAB — CULTURE, BODY FLUID-BOTTLE

## 2015-07-22 LAB — BASIC METABOLIC PANEL
ANION GAP: 11 (ref 5–15)
BUN: 19 mg/dL (ref 6–20)
CHLORIDE: 93 mmol/L — AB (ref 101–111)
CO2: 24 mmol/L (ref 22–32)
Calcium: 8.9 mg/dL (ref 8.9–10.3)
Creatinine, Ser: 0.65 mg/dL (ref 0.44–1.00)
GFR calc Af Amer: >60 mL/min (ref >60)
GLUCOSE: 121 mg/dL — AB (ref 65–99)
POTASSIUM: 4.4 mmol/L (ref 3.5–5.1)
SODIUM: 128 mmol/L — AB (ref 135–145)

## 2015-07-22 LAB — CULTURE, BODY FLUID W GRAM STAIN -BOTTLE: Culture: NO GROWTH

## 2015-07-22 MED ORDER — CHLORHEXIDINE GLUCONATE 4 % EX LIQD
1.0000 "application " | Freq: Once | CUTANEOUS | Status: DC
  Filled 2015-07-22: qty 15

## 2015-07-22 NOTE — Progress Notes (Signed)
Surgery attending note:   I initially evaluated this patient in the breast multidisciplinary clinic last Wednesday. At that time she was being evaluated for a locally advanced cancer in the left breast upper outer quadrant.  At the time of her initial evaluation she exhibited shortness of breath, tachypnea, tachycardia, and significant abdominal distention with fluid wave. She was admitted immediately admitted and has been found to have lactic acidosis and high volume ascites secondary to extensive liver metastasis.  After 2 paracenteses, she is feeling better.  Denies abdominal pain.  Vital signs are better.  Tolerating diet.  Cultures are negative.  She remains on empiric vancomycin and Zosyn.  Abdominal exam shows some distention with fluid wave, softer than before.  Nontender.  Dr. [name] plans to initiate palliative chemotherapy as an inpatient, and has requested that I place a Port-A-Cath.  Port-A-Cath was initially scheduled for tomorrow, but was canceled by the OR due to staffing issues.  Port-A-Cath insertion is scheduled for Wednesday afternoon.  I discussed the indications, details, techniques, and numerous risk of Port-A-Cath with the patient and her family.  She is aware the risk of bleeding, infection, catheter malfunction requiring revision, catheter embolus, pneumothorax requiring chest tube, vascular injury, and other unforeseen problems.  She understands all these issues well.  At this time all of her questions are answered.  She agrees with this plan.  The nursing staff is going to asked the oncology educator to give her further information about Port-A-Cath usage and care.   Edsel Petrin. Dalbert Batman, M.D., [facility] General and Minimally invasive Surgery Breast and Colorectal Surgery

## 2015-07-22 NOTE — Progress Notes (Signed)
[facility] PROGRESS NOTE  Dawn Haynes [id number] DOB: 1981/04/18 DOA: 07/17/2015 PCP: Mora Bellman, MD  Assessment/Plan: 35 year old female with recently diagnosed left breast cancer. she felt a lump in her left upper outer quadrant for almost 1 year which is slowly increasing in size and becoming painful. A mammogram and ultrasound showed 5.3 cm mass with abnormal left axillary lymph nodes. Biopsy was positive for high-grade triple negative breast cancer. She was presented to the tumor board on 1/4 and then saw her oncologist Dr. [name] the same day. She was found to be tachycardic and tachypneic with abdominal distention (which she informs for the past 1 month). She was then sent to the ED for further management and paracentesis. ED she underwent diagnostic and therapeutic paracentesis with about 6 L bloody fluid removed. It showed about 20,00 white cells. She was septic with tachycardia, tachypnea and low blood pressure with markedly elevated lactic acid (5.68) She was placed on IV vancomycin and Zosyn concern for SBP and sepsis and admitted to stepdown unit.   1. Sepsis Likely secondary to SBP versus SIRS due to high tumor burden with liver metastases and malignant ascites - Received aggressive hydration for persistent lactic acid elevation. Dr. Clementeen Graham discussed with PCCM and persistent lactic acid elevation is likely due to her liver failure.  -Pt received  Empiric Zosyn. Blood cultures: NGTD. Ascitic fluid culture: NGTD. Will d/c atx in 24-48 hrs   2. Metastatic left breast cancer. Grade 3 invasive ductal carcinoma per pathology, Triple negative. CT abdomen and pelvis done on 1/4 showing enlarged liver largely replaced by extensive confluent metastatic disease with large volume ascites mainly in the pelvic area. Also has mass effect on the right kidney by and large right liver lobe. - Dr. [name] consulted, discussed with patient on 1/6, plans for port placement and starting  chemotherapy in 2-3 days. Defer management to oncology. appreciate the input    3. Ascites due liver mets. Elevated LFTs.  CT abd: Liver is enlarged and largely replaced by extensive confluent metastatic disease -s/p paracentesis with 6L removal on 1/4 and 5.7 L removal on 1/7. S/p IV albumin x 2. -unfortunately, liver function somewhat continues to decline. Prelim ascitic fluid cytology is neg. we will cont paracentesis as needed. May need some diuresis when more stable clinically  4. Hyponatremia in the setting of fluid overload, monitor. Worsening today, likely related to albumin administration yesterday - s/p 60 mg IV Lasix on 1/6, repeat Lasix 1/8. Weight up and she is net positive 5 L 5. Protein calorie malnutrition Dietitian consulted 6. Sinus tachycardia likely in the setting of extensive metastatic disease, she is asymptomatic. - TSH checked and returned normal   Prognosis is guarded due to advanced liver mets with breast cancer, at risk for multiorgan dysfunction   Code Status: full Family Communication:  D/w patient, RN (indicate person spoken with, relationship, and if by phone, the number) Disposition Plan: pend clinical improvement   Consultants:  Dr. [name]  PCCM ( will follow as needed)  Procedures:  CT abdomen and pelvis  Large volume paracentesis on 1/4  Antibiotics: IV Zosyn since 1/4    HPI/Subjective: Alert. No distress   Objective: Filed Vitals:   07/22/15 0152 07/22/15 0603  BP: 111/64 115/90  Pulse: 120 130  Temp: 98 F (36.7 C) 98 F (36.7 C)  Resp: 16 16    Intake/Output Summary (Last 24 hours) at 07/22/15 0931 Last data filed at 07/21/15 2004  Gross per 24 hour  Intake  100 ml  Output      0 ml  Net    100 ml   Filed Weights   07/17/15 1050 07/18/15 0357 07/20/15 0424  Weight: 73.029 kg (161 lb) 73.4 kg (161 lb 13.1 oz) 82.5 kg (181 lb 14.1 oz)    Exam:   General:  No distress, reports abdominal less distention    Cardiovascular: s1, s2 tachycardia   Respiratory: CTA BL  Abdomen: soft, distended, NT  Musculoskeletal: mild pedal edema    Data Reviewed: Basic Metabolic Panel:  Recent Labs Lab 07/18/15 1823 07/19/15 0340 07/20/15 0334 07/21/15 0500 07/22/15 0403  NA 135 131* 131* 129* 128*  K 4.4 4.2 4.3 4.7 4.4  CL 105 102 98* 96* 93*  CO2 21* 19* 20* 21* 24  GLUCOSE 105* 118* 115* 98 121*  BUN 10 11 13 15 19   CREATININE 0.63 0.70 0.62 0.60 0.65  CALCIUM 7.1* 7.6* 8.5* 8.9 8.9   Liver Function Tests:  Recent Labs Lab 07/17/15 0816 07/17/15 1255 07/20/15 0334 07/21/15 0500  AST 492* 412* 495* 536*  ALT 94* 76* 156* 164*  ALKPHOS 316* 262* 383* 359*  BILITOT 0.92 1.4* 1.7* 2.2*  PROT 6.4 5.4* 5.5* 5.3*  ALBUMIN 2.4* 2.2* 2.8* 3.0*   No results for input(s): LIPASE, AMYLASE in the last 168 hours. No results for input(s): AMMONIA in the last 168 hours. CBC:  Recent Labs Lab 07/17/15 0816 07/17/15 1040 07/18/15 0549 07/19/15 0340 07/21/15 0500  WBC 9.1 9.3 11.8* 10.8* 13.8*  NEUTROABS 5.8 5.6  --   --   --   HGB 12.8 12.9 10.9* 10.8* 11.1*  HCT 39.4 40.0 34.9* 34.5* 34.2*  MCV 84.2 85.3 86.8 87.1 84.4  PLT 467* 587* 433* 457* 444*   Cardiac Enzymes: No results for input(s): CKTOTAL, CKMB, CKMBINDEX, TROPONINI in the last 168 hours. BNP (last 3 results) No results for input(s): BNP in the last 8760 hours.  ProBNP (last 3 results) No results for input(s): PROBNP in the last 8760 hours.  CBG: No results for input(s): GLUCAP in the last 168 hours.  Recent Results (from the past 240 hour(s))  Blood Culture (routine x 2)     Status: None (Preliminary result)   Collection Time: 07/17/15 10:40 AM  Result Value Ref Range Status   Specimen Description BLOOD RIGHT ANTECUBITAL  Final   Special Requests BOTTLES DRAWN AEROBIC AND ANAEROBIC 5ML  Final   Culture   Final    NO GROWTH 4 DAYS Performed at [facility]    Report Status PENDING  Incomplete   Blood Culture (routine x 2)     Status: None (Preliminary result)   Collection Time: 07/17/15 11:00 AM  Result Value Ref Range Status   Specimen Description BLOOD LEFT FOREARM  Final   Special Requests BOTTLES DRAWN AEROBIC ONLY 5ML  Final   Culture   Final    NO GROWTH 4 DAYS Performed at [facility]    Report Status PENDING  Incomplete  Culture, body fluid-bottle     Status: None (Preliminary result)   Collection Time: 07/17/15  3:47 PM  Result Value Ref Range Status   Specimen Description FLUID PERITONEAL  Final   Special Requests BOTTLES DRAWN AEROBIC AND ANAEROBIC 10CC  Final   Culture   Final    NO GROWTH 4 DAYS Performed at [facility]    Report Status PENDING  Incomplete  Gram stain     Status: None   Collection Time:  07/17/15  3:47 PM  Result Value Ref Range Status   Specimen Description FLUID PERITONEAL  Final   Special Requests NONE  Final   Gram Stain   Final    CYTOSPIN SMEAR WBC PRESENT,BOTH PMN AND MONONUCLEAR NO ORGANISMS SEEN Gram Stain Report Called to,Read Back By and Verified With: FAITH HASZ RN 1.4.17 @ [id number] BY RICEJ Performed at [facility]    Report Status 07/17/2015 FINAL  Final  Urine culture     Status: None   Collection Time: 07/17/15  8:09 PM  Result Value Ref Range Status   Specimen Description URINE, CLEAN CATCH  Final   Special Requests NONE  Final   Culture   Final    NO GROWTH 1 DAY Performed at [facility]    Report Status 07/19/2015 FINAL  Final  MRSA PCR Screening     Status: None   Collection Time: 07/18/15  3:59 AM  Result Value Ref Range Status   MRSA by PCR NEGATIVE NEGATIVE Final    Comment:        The GeneXpert MRSA Assay (FDA approved for NASAL specimens only), is one component of a comprehensive MRSA colonization surveillance program. It is not intended to diagnose MRSA infection nor to guide or monitor treatment for MRSA infections.      Studies: US Paracentesis  07/20/2015   CLINICAL DATA:  Ascites EXAM: ULTRASOUND GUIDED PARACENTESIS TECHNIQUE: Survey ultrasound of the abdomen was performed and an appropriate skin entry site in the right lower abdomen was selected. Skin site was marked, prepped with Betadine, and draped in usual sterile fashion, and infiltrated locally with 1% lidocaine. A 5 French multisidehole Yueh sheath needle was advanced into the peritoneal space until fluid could be aspirated. The sheath was advanced and the needle removed. 5.7 L of serosanguineous ascites were aspirated. COMPLICATIONS: None IMPRESSION: Technically successful ultrasound guided paracentesis, removing 5.7 L of ascites. Electronically Signed   By: Marybelle Killings M.D.   On: 07/20/2015 12:06    Scheduled Meds: . acidophilus  1 capsule Oral Daily  . piperacillin-tazobactam (ZOSYN)  IV  3.375 g Intravenous Q8H  . senna-docusate  2 tablet Oral Daily  . vitamin B-12  100 mcg Oral Daily   Continuous Infusions:   Principal Problem:   SBP (spontaneous bacterial peritonitis) ([place]) Active Problems:   Breast cancer of upper-outer quadrant of left female breast (HCC)   Ascites   Peritonitis (HCC)   Abdominal pain   SOB (shortness of breath)   DDD (degenerative disc disease), lumbar   Severe sepsis ([place])   Sepsis ([place])    Time spent: >35 minutes     Kinnie Feil  [facility] Pager [phone]. If 7PM-7AM, please contact night-coverage at [email or web address], password [facility] 07/22/2015, 9:31 AM  LOS: 5 days

## 2015-07-22 NOTE — Care Management Note (Signed)
Case Management Note  Patient Details  Name: Dawn Haynes MRN: [id number] Date of Birth: 28-Nov-1980  Subjective/Objective:   Sepsis-iv abx. From home.                 Action/Plan:d/c plan home.   Expected Discharge Date:   (unknown)               Expected Discharge Plan:  Home/Self Care  In-House Referral:  NA  Discharge planning Services  CM Consult  Post Acute Care Choice:    Choice offered to:     DME Arranged:    DME Agency:     HH Arranged:    HH Agency:     Status of Service:  In process, will continue to follow  Medicare Important Message Given:    Date Medicare IM Given:    Medicare IM give by:    Date Additional Medicare IM Given:    Additional Medicare Important Message give by:     If discussed at [place] of Stay Meetings, dates discussed:    Additional Comments:  Dessa Phi, RN 07/22/2015, 4:05 PM

## 2015-07-22 NOTE — Progress Notes (Signed)
   07/22/15 1500  Clinical Encounter Type  Visited With Patient  Visit Type Follow-up   Made brief f/u visit to bring [place] a prayer shawl as a tangible sign of support and care.  Included my card and [place] brochure for future connections.  Dawn Haynes was very sleepy, but verbalized gratitude.  Chaplain Lorrin Jackson, MDiv, [place] M-F daytime pager [phone] [facility] 24/7 pager [phone]

## 2015-07-22 NOTE — Progress Notes (Signed)
   07/22/15 1300  Clinical Encounter Type  Visited With Patient  Visit Type Spiritual support;Social support  Referral From Chaplain (Charles Lumpkin, DMin)  Spiritual Encounters  Spiritual Needs Emotional  Stress Factors  Patient Stress Factors Health changes;Loss of control;Major life changes   Referred by weekend chaplain Charlie Lumpkin for f/u support.  Dawn Haynes is a mom of six (2, 54, 10, and three teenagers) and reports good support from husband, parents, in-laws (who are caring for children now), aunts, and other family.  She verbalizes determination (with bright affect) to get well enough for chemo tx because she wants as much time with her family as possible. She also notes that she and her husband talk frankly and discussed some of her personal GOC/EOL/arrangements wishes upon learning the scope of her dx.    Dawn Haynes has many questions about what to expect from chemo (side effects, likely gain from tx); encouraged her to ask MDs.  She is aware of ongoing chaplain availability for support.  Plan to f/u by phone, but please also page as needs arise/circumstances change.  Thank you.  Chaplain Lorrin Jackson, MDiv, [place] M-F daytime pager [phone] [facility] 24/7 pager [phone]

## 2015-07-22 NOTE — Progress Notes (Signed)
HEMATOLOGY-ONCOLOGY PROGRESS NOTE  SUBJECTIVE: Abdomen distended, fatigue  OBJECTIVE: PHYSICAL EXAMINATION: ECOG PERFORMANCE STATUS: 3 - Symptomatic, >50% confined to bed  Filed Vitals:   07/22/15 0603 07/22/15 1423  BP: 115/90 120/86  Pulse: 130 120  Temp: 98 F (36.7 C) 98.3 F (36.8 C)  Resp: 16 18   Filed Weights   07/17/15 1050 07/18/15 0357 07/20/15 0424  Weight: 161 lb (73.029 kg) 161 lb 13.1 oz (73.4 kg) 181 lb 14.1 oz (82.5 kg)    GENERAL:alert, no distress and comfortable SKIN: skin color, texture, turgor are normal, no rashes or significant lesions EYES: normal, Conjunctiva are pink and non-injected, sclera clear OROPHARYNX:no exudate, no erythema and lips, buccal mucosa, and tongue normal  NECK: supple, thyroid normal size, non-tender, without nodularity LYMPH:  no palpable lymphadenopathy in the cervical, axillary or inguinal LUNGS: clear to auscultation and percussion with normal breathing effort HEART: regular rate & rhythm and no murmurs and no lower extremity edema ABDOMEN:Ascites and hepatomegaly Musculoskeletal:no cyanosis of digits and no clubbing  NEURO: alert & oriented x 3 with fluent speech, no focal motor/sensory deficits  LABORATORY DATA:  I have reviewed the data as listed CMP Latest Ref Rng 07/22/2015 07/21/2015 07/20/2015  Glucose 65 - 99 mg/dL 121(H) 98 115(H)  BUN 6 - 20 mg/dL 19 15 13   Creatinine 0.44 - 1.00 mg/dL 0.65 0.60 0.62  Sodium 135 - 145 mmol/L 128(L) 129(L) 131(L)  Potassium 3.5 - 5.1 mmol/L 4.4 4.7 4.3  Chloride 101 - 111 mmol/L 93(L) 96(L) 98(L)  CO2 22 - 32 mmol/L 24 21(L) 20(L)  Calcium 8.9 - 10.3 mg/dL 8.9 8.9 8.5(L)  Total Protein 6.5 - 8.1 g/dL - 5.3(L) 5.5(L)  Total Bilirubin 0.3 - 1.2 mg/dL - 2.2(H) 1.7(H)  Alkaline Phos 38 - 126 U/L - 359(H) 383(H)  AST 15 - 41 U/L - 536(H) 495(H)  ALT 14 - 54 U/L - 164(H) 156(H)    Lab Results  Component Value Date   WBC 13.8* 07/21/2015   HGB 11.1* 07/21/2015   HCT 34.2*  07/21/2015   MCV 84.4 07/21/2015   PLT 444* 07/21/2015   NEUTROABS 5.6 07/17/2015    ASSESSMENT AND PLAN: 1. Metastatic Breast cancer: I discussed with her that we will need to start chemo asap to prevent frequent ascites and pain from hepatomegaly. I recommended Carbo- Gemcitabine (day 1 and 8 Q 3 weeks) to start day after port placement (hopefully to start wednesday) Counseled risks and benefits of chemo including nausea, risk of infection, bone marrow suppression and thrombocytopenia as well as fatigue and loss of taste. Patient understands and consented to treatment  2. Ascites and hepatomegaly from [facility]

## 2015-07-23 ENCOUNTER — Encounter (HOSPITAL_COMMUNITY)
Admission: EM | Disposition: A | Discharge status: Home Health | Payer: Self-pay | Source: Ambulatory Visit | Attending: Internal Medicine

## 2015-07-23 LAB — CBC
HCT: 37.6 % (ref 36.0–46.0)
Hemoglobin: 12.1 g/dL (ref 12.0–15.0)
MCH: 27.1 pg (ref 26.0–34.0)
MCHC: 32.2 g/dL (ref 30.0–36.0)
MCV: 84.1 fL (ref 78.0–100.0)
PLATELETS: 454 10*3/uL — AB (ref 150–400)
RBC: 4.47 MIL/uL (ref 3.87–5.11)
RDW: 14.4 % (ref 11.5–15.5)
WBC: 15.6 10*3/uL — ABNORMAL HIGH (ref 4.0–10.5)

## 2015-07-23 LAB — COMPREHENSIVE METABOLIC PANEL
ALBUMIN: 2.3 g/dL — AB (ref 3.5–5.0)
ALT: 233 U/L — ABNORMAL HIGH (ref 14–54)
ANION GAP: 11 (ref 5–15)
AST: 736 U/L — ABNORMAL HIGH (ref 15–41)
Alkaline Phosphatase: 498 U/L — ABNORMAL HIGH (ref 38–126)
BUN: 21 mg/dL — ABNORMAL HIGH (ref 6–20)
CO2: 24 mmol/L (ref 22–32)
Calcium: 8.8 mg/dL — ABNORMAL LOW (ref 8.9–10.3)
Chloride: 90 mmol/L — ABNORMAL LOW (ref 101–111)
Creatinine, Ser: 0.71 mg/dL (ref 0.44–1.00)
GFR calc non Af Amer: >60 mL/min (ref >60)
GLUCOSE: 94 mg/dL (ref 65–99)
POTASSIUM: 5.1 mmol/L (ref 3.5–5.1)
SODIUM: 125 mmol/L — AB (ref 135–145)
Total Bilirubin: 1.8 mg/dL — ABNORMAL HIGH (ref 0.3–1.2)
Total Protein: 5.2 g/dL — ABNORMAL LOW (ref 6.5–8.1)

## 2015-07-23 LAB — OTHER BODY FLUID CHEMISTRY: MISCELLANEOUS TEST RESULTS: 5888

## 2015-07-23 LAB — APTT: aPTT: 31 seconds (ref 24–37)

## 2015-07-23 LAB — PROTIME-INR
INR: 1.24 (ref 0.00–1.49)
PROTHROMBIN TIME: 15.8 s — AB (ref 11.6–15.2)

## 2015-07-23 SURGERY — INSERTION, TUNNELED CENTRAL VENOUS DEVICE, WITH PORT
Anesthesia: General

## 2015-07-23 MED ORDER — CHLORHEXIDINE GLUCONATE 4 % EX LIQD
1.0000 "application " | Freq: Once | CUTANEOUS | Status: AC
  Administered 2015-07-24: 1 via TOPICAL
  Filled 2015-07-23: qty 15

## 2015-07-23 MED ORDER — CEFAZOLIN SODIUM-DEXTROSE 2-3 GM-% IV SOLR
2.0000 g | INTRAVENOUS | Status: AC
  Administered 2015-07-24: 2 g via INTRAVENOUS

## 2015-07-23 NOTE — Progress Notes (Signed)
General surgery attending:  Patient is stable.  Tolerating diet.  Perhaps a little more discomfort from abdominal distention Exam reveals she is alert and cooperative.  Afebrile.  Heart rate 114.  SPO2 100% on room air. Abdomen distended but soft and nontender.  Lab work reveals hemoglobin 12.1, WBC 15,600, platelet count 454,000.  INR 1.24.  PTT 31.  Total bilirubin 1.8.  Albumin 2.3.  Creatinine 0.71.  Potassium 5.1.  She is at increased risk for any surgical procedure due to protein calorie malnutrition, stage IV breast cancer with extensive metastasis and ascites, and impaired liver function.  She is ready to proceed with Port-A-Cath insertion in the operating room tomorrow.  This will be in the mid afternoon some time.  Once again, we discussed the indications, techniques, and numerous risk of the surgery.  She is comfortable with all of this.  All of her questions are answered.  She agrees with this plan.   Edsel Petrin. Dalbert Batman, M.D., [facility] General and Minimally invasive Surgery Breast and Colorectal Surgery Office:   [phone]

## 2015-07-23 NOTE — Progress Notes (Signed)
Initial chemotherapy education regarding Gemcitabine and Carboplatin begun.  Patient's spouse listened in on cell phone.  Will return in the morning, review today's teaching and give them a copy of "About Your Cancer Care."

## 2015-07-23 NOTE — Progress Notes (Signed)
[facility] PROGRESS NOTE  Dawn Haynes [id number] DOB: June 04, 1981 DOA: 07/17/2015 PCP: Mora Bellman, MD  Assessment/Plan: 35 y/o female with recently diagnosed left breast cancer. she felt a lump in her left upper outer quadrant for almost 1 year which is slowly increasing in size and becoming painful. A mammogram and ultrasound showed 5.3 cm mass with abnormal left axillary lymph nodes. Biopsy was positive for high-grade triple negative breast cancer. She was presented to the tumor board on 1/4 and then saw her oncologist Dr. [name] the same day. She was found to be tachycardic and tachypneic with abdominal distention (which she informs for the past 1 month). She was then sent to the ED for further management and paracentesis. ED she underwent diagnostic and therapeutic paracentesis with about 6 L bloody fluid removed. It showed about 20,00 white cells. She was septic with tachycardia, tachypnea and low blood pressure with markedly elevated lactic acid (5.68) She was placed on IV vancomycin and Zosyn concern for SBP and sepsis and admitted to stepdown unit.   1. Sepsis Likely secondary to SBP versus SIRS due to high tumor burden with liver metastases and malignant ascites. Received aggressive hydration for persistent lactic acid elevation. Dr. Clementeen Graham discussed with PCCM and persistent lactic acid elevation is likely due to her liver failure.  -Pt is on  Empiric Zosyn. Blood cultures: NGTD. Ascitic fluid culture: NGTD. Will d/c atx in 24 hrs if remains stable   2. Metastatic left breast cancer. Grade 3 invasive ductal carcinoma per pathology, Triple negative. CT abdomen and pelvis done on 1/4 showing enlarged liver largely replaced by extensive confluent metastatic disease with large volume ascites mainly in the pelvic area. Also has mass effect on the right kidney by and large right liver lobe. - Dr. [name] consulted, discussed with patient. Plan for port placement by surgery on 1/11 and  starting chemotherapy on 1/12. Appreciate oncology, surgery input    3. Ascites due liver mets. Elevated LFTs.  Hepatitis panel is negative. CT abd: Liver is enlarged and largely replaced by extensive confluent metastatic disease. s/p paracentesis with 6L removal on 1/4 and 5.7 L removal on 1/7. S/p IV albumin x 2. -unfortunately, liver function somewhat continues to decline. Prelim ascitic fluid cytology is neg. we will cont paracentesis as needed vs pleurex cath. May need some diuresis when more stable clinically  4. Hyponatremia in the setting of fluid overload, also received lasix 2 days ago. Worsening today, likely related to ongoing liver failure. s/p 60 mg IV Lasix on 1/6, repeat Lasix 1/8. Weight up and she is net positive 5 L -we will change diet to regular. Hold diuretics. Repeat labs in AM  5. Protein calorie malnutrition Dietitian consulted 6. Sinus tachycardia likely in the setting of extensive metastatic disease, she is asymptomatic. - TSH checked and returned normal   Prognosis is guarded due to advanced, extensive liver mets with breast cancer at risk for multiorgan dysfunction. Probable needs palliative care evalutiion as well    Code Status: full Family Communication:  D/w patient, he husband. RN (indicate person spoken with, relationship, and if by phone, the number) Disposition Plan: pend clinical improvement   Consultants:  Dr. [name]  PCCM ( will follow as needed)  Procedures:  CT abdomen and pelvis  Large volume paracentesis on 1/4  Antibiotics: IV Zosyn since 1/4    HPI/Subjective: Alert. No distress   Objective: Filed Vitals:   07/23/15 0157 07/23/15 0429  BP: 111/80 118/82  Pulse: 120 114  Temp: 98  F (36.7 C) 97.5 F (36.4 C)  Resp: 16 18    Intake/Output Summary (Last 24 hours) at 07/23/15 0934 Last data filed at 07/23/15 0700  Gross per 24 hour  Intake    750 ml  Output      0 ml  Net    750 ml   Filed Weights   07/17/15 1050  07/18/15 0357 07/20/15 0424  Weight: 73.029 kg (161 lb) 73.4 kg (161 lb 13.1 oz) 82.5 kg (181 lb 14.1 oz)    Exam:   General:  No distress, reports abdominal less distention   Cardiovascular: s1, s2 tachycardia   Respiratory: CTA BL  Abdomen: soft, distended, NT  Musculoskeletal: mild pedal edema    Data Reviewed: Basic Metabolic Panel:  Recent Labs Lab 07/19/15 0340 07/20/15 0334 07/21/15 0500 07/22/15 0403 07/23/15 0350  NA 131* 131* 129* 128* 125*  K 4.2 4.3 4.7 4.4 5.1  CL 102 98* 96* 93* 90*  CO2 19* 20* 21* 24 24  GLUCOSE 118* 115* 98 121* 94  BUN 11 13 15 19  21*  CREATININE 0.70 0.62 0.60 0.65 0.71  CALCIUM 7.6* 8.5* 8.9 8.9 8.8*   Liver Function Tests:  Recent Labs Lab 07/17/15 0816 07/17/15 1255 07/20/15 0334 07/21/15 0500 07/23/15 0350  AST 492* 412* 495* 536* 736*  ALT 94* 76* 156* 164* 233*  ALKPHOS 316* 262* 383* 359* 498*  BILITOT 0.92 1.4* 1.7* 2.2* 1.8*  PROT 6.4 5.4* 5.5* 5.3* 5.2*  ALBUMIN 2.4* 2.2* 2.8* 3.0* 2.3*   No results for input(s): LIPASE, AMYLASE in the last 168 hours. No results for input(s): AMMONIA in the last 168 hours. CBC:  Recent Labs Lab 07/17/15 0816 07/17/15 1040 07/18/15 0549 07/19/15 0340 07/21/15 0500 07/23/15 0350  WBC 9.1 9.3 11.8* 10.8* 13.8* 15.6*  NEUTROABS 5.8 5.6  --   --   --   --   HGB 12.8 12.9 10.9* 10.8* 11.1* 12.1  HCT 39.4 40.0 34.9* 34.5* 34.2* 37.6  MCV 84.2 85.3 86.8 87.1 84.4 84.1  PLT 467* 587* 433* 457* 444* 454*   Cardiac Enzymes: No results for input(s): CKTOTAL, CKMB, CKMBINDEX, TROPONINI in the last 168 hours. BNP (last 3 results) No results for input(s): BNP in the last 8760 hours.  ProBNP (last 3 results) No results for input(s): PROBNP in the last 8760 hours.  CBG: No results for input(s): GLUCAP in the last 168 hours.  Recent Results (from the past 240 hour(s))  Blood Culture (routine x 2)     Status: None   Collection Time: 07/17/15 10:40 AM  Result Value Ref  Range Status   Specimen Description BLOOD RIGHT ANTECUBITAL  Final   Special Requests BOTTLES DRAWN AEROBIC AND ANAEROBIC 5ML  Final   Culture   Final    NO GROWTH 5 DAYS Performed at [facility]    Report Status 07/22/2015 FINAL  Final  Blood Culture (routine x 2)     Status: None   Collection Time: 07/17/15 11:00 AM  Result Value Ref Range Status   Specimen Description BLOOD LEFT FOREARM  Final   Special Requests BOTTLES DRAWN AEROBIC ONLY 5ML  Final   Culture   Final    NO GROWTH 5 DAYS Performed at [facility]    Report Status 07/22/2015 FINAL  Final  Culture, body fluid-bottle     Status: None   Collection Time: 07/17/15  3:47 PM  Result Value Ref Range Status   Specimen Description FLUID PERITONEAL  Final   Special Requests BOTTLES DRAWN AEROBIC AND ANAEROBIC 10CC  Final   Culture   Final    NO GROWTH 5 DAYS Performed at [facility]    Report Status 07/22/2015 FINAL  Final  Gram stain     Status: None   Collection Time: 07/17/15  3:47 PM  Result Value Ref Range Status   Specimen Description FLUID PERITONEAL  Final   Special Requests NONE  Final   Gram Stain   Final    CYTOSPIN SMEAR WBC PRESENT,BOTH PMN AND MONONUCLEAR NO ORGANISMS SEEN Gram Stain Report Called to,Read Back By and Verified With: FAITH HASZ RN 1.4.17 @ [id number] BY RICEJ Performed at [facility]    Report Status 07/17/2015 FINAL  Final  Urine culture     Status: None   Collection Time: 07/17/15  8:09 PM  Result Value Ref Range Status   Specimen Description URINE, CLEAN CATCH  Final   Special Requests NONE  Final   Culture   Final    NO GROWTH 1 DAY Performed at [facility]    Report Status 07/19/2015 FINAL  Final  MRSA PCR Screening     Status: None   Collection Time: 07/18/15  3:59 AM  Result Value Ref Range Status   MRSA by PCR NEGATIVE NEGATIVE Final    Comment:        The GeneXpert MRSA Assay (FDA approved for NASAL specimens only), is one component  of a comprehensive MRSA colonization surveillance program. It is not intended to diagnose MRSA infection nor to guide or monitor treatment for MRSA infections.      Studies: No results found.  Scheduled Meds: . acidophilus  1 capsule Oral Daily  . chlorhexidine  1 application Topical Once  . piperacillin-tazobactam (ZOSYN)  IV  3.375 g Intravenous Q8H  . senna-docusate  2 tablet Oral Daily  . vitamin B-12  100 mcg Oral Daily   Continuous Infusions:   Principal Problem:   SBP (spontaneous bacterial peritonitis) ([place]) Active Problems:   Breast cancer of upper-outer quadrant of left female breast (HCC)   Ascites   Peritonitis (HCC)   Abdominal pain   SOB (shortness of breath)   DDD (degenerative disc disease), lumbar   Severe sepsis ([place])   Sepsis ([place])    Time spent: >35 minutes     Kinnie Feil  [facility] Pager [phone]. If 7PM-7AM, please contact night-coverage at [email or web address], password [facility] 07/23/2015, 9:34 AM  LOS: 6 days

## 2015-07-24 ENCOUNTER — Inpatient Hospital Stay (HOSPITAL_COMMUNITY): Payer: Medicaid Other | Admitting: Anesthesiology

## 2015-07-24 ENCOUNTER — Inpatient Hospital Stay (HOSPITAL_COMMUNITY): Payer: Medicaid Other

## 2015-07-24 ENCOUNTER — Encounter (HOSPITAL_COMMUNITY)
Admission: EM | Disposition: A | Discharge status: Home Health | Payer: Self-pay | Source: Ambulatory Visit | Attending: Internal Medicine

## 2015-07-24 ENCOUNTER — Encounter (HOSPITAL_COMMUNITY): Payer: Self-pay | Admitting: Certified Registered"

## 2015-07-24 DIAGNOSIS — C787 Secondary malignant neoplasm of liver and intrahepatic bile duct: Secondary | ICD-10-CM | POA: Diagnosis present

## 2015-07-24 DIAGNOSIS — R000 Tachycardia, unspecified: Secondary | ICD-10-CM

## 2015-07-24 HISTORY — PX: PORTACATH PLACEMENT: SHX2246

## 2015-07-24 LAB — COMPREHENSIVE METABOLIC PANEL
ALT: 227 U/L — AB (ref 14–54)
AST: 714 U/L — AB (ref 15–41)
Albumin: 2.3 g/dL — ABNORMAL LOW (ref 3.5–5.0)
Alkaline Phosphatase: 548 U/L — ABNORMAL HIGH (ref 38–126)
Anion gap: 11 (ref 5–15)
BUN: 25 mg/dL — ABNORMAL HIGH (ref 6–20)
CHLORIDE: 89 mmol/L — AB (ref 101–111)
CO2: 26 mmol/L (ref 22–32)
CREATININE: 0.79 mg/dL (ref 0.44–1.00)
Calcium: 8.9 mg/dL (ref 8.9–10.3)
GFR calc non Af Amer: >60 mL/min (ref >60)
Glucose, Bld: 113 mg/dL — ABNORMAL HIGH (ref 65–99)
Potassium: 5 mmol/L (ref 3.5–5.1)
SODIUM: 126 mmol/L — AB (ref 135–145)
Total Bilirubin: 1.2 mg/dL (ref 0.3–1.2)
Total Protein: 5.5 g/dL — ABNORMAL LOW (ref 6.5–8.1)

## 2015-07-24 LAB — CBC
HCT: 35.6 % — ABNORMAL LOW (ref 36.0–46.0)
Hemoglobin: 11.5 g/dL — ABNORMAL LOW (ref 12.0–15.0)
MCH: 26.7 pg (ref 26.0–34.0)
MCHC: 32.3 g/dL (ref 30.0–36.0)
MCV: 82.8 fL (ref 78.0–100.0)
PLATELETS: 455 10*3/uL — AB (ref 150–400)
RBC: 4.3 MIL/uL (ref 3.87–5.11)
RDW: 14.4 % (ref 11.5–15.5)
WBC: 16.6 10*3/uL — AB (ref 4.0–10.5)

## 2015-07-24 SURGERY — INSERTION, TUNNELED CENTRAL VENOUS DEVICE, WITH PORT
Anesthesia: General | Laterality: Right

## 2015-07-24 MED ORDER — PROPOFOL 10 MG/ML IV BOLUS
INTRAVENOUS | Status: AC
  Filled 2015-07-24: qty 20

## 2015-07-24 MED ORDER — HYDROMORPHONE HCL 1 MG/ML IJ SOLN
1.0000 mg | INTRAMUSCULAR | Status: DC | PRN
  Administered 2015-07-24 – 2015-07-29 (×28): 1 mg via INTRAVENOUS
  Filled 2015-07-24 (×29): qty 1

## 2015-07-24 MED ORDER — MEPERIDINE HCL 50 MG/ML IJ SOLN: 6.2500 mg | INTRAMUSCULAR | Status: DC | PRN

## 2015-07-24 MED ORDER — ONDANSETRON HCL 4 MG/2ML IJ SOLN
INTRAMUSCULAR | Status: AC
  Filled 2015-07-24: qty 2

## 2015-07-24 MED ORDER — HEPARIN SOD (PORK) LOCK FLUSH 100 UNIT/ML IV SOLN
INTRAVENOUS | Status: AC
  Filled 2015-07-24: qty 5

## 2015-07-24 MED ORDER — LACTATED RINGERS IV SOLN: INTRAVENOUS | Status: DC

## 2015-07-24 MED ORDER — FENTANYL CITRATE (PF) 100 MCG/2ML IJ SOLN
INTRAMUSCULAR | Status: AC
  Filled 2015-07-24: qty 2

## 2015-07-24 MED ORDER — HEPARIN SOD (PORK) LOCK FLUSH 100 UNIT/ML IV SOLN
INTRAVENOUS | Status: DC | PRN
Start: 2015-07-24 — End: 2015-07-24
  Administered 2015-07-24: 500 [IU]

## 2015-07-24 MED ORDER — BUPIVACAINE-EPINEPHRINE (PF) 0.5% -1:200000 IJ SOLN
INTRAMUSCULAR | Status: DC | PRN
  Administered 2015-07-24: 10 mL

## 2015-07-24 MED ORDER — BUPIVACAINE-EPINEPHRINE (PF) 0.5% -1:200000 IJ SOLN
INTRAMUSCULAR | Status: AC
  Filled 2015-07-24: qty 30

## 2015-07-24 MED ORDER — MIDAZOLAM HCL 5 MG/5ML IJ SOLN
INTRAMUSCULAR | Status: DC | PRN
  Administered 2015-07-24 (×2): 1 mg via INTRAVENOUS

## 2015-07-24 MED ORDER — LACTATED RINGERS IV SOLN
INTRAVENOUS | Status: DC | PRN
  Administered 2015-07-24: 15:00:00 via INTRAVENOUS

## 2015-07-24 MED ORDER — DEXAMETHASONE SODIUM PHOSPHATE 10 MG/ML IJ SOLN
INTRAMUSCULAR | Status: AC
  Filled 2015-07-24: qty 1

## 2015-07-24 MED ORDER — DEXAMETHASONE SODIUM PHOSPHATE 10 MG/ML IJ SOLN
INTRAMUSCULAR | Status: DC | PRN
  Administered 2015-07-24: 10 mg via INTRAVENOUS

## 2015-07-24 MED ORDER — SODIUM CHLORIDE 0.9 % IV SOLN
Freq: Once | INTRAVENOUS | Status: AC
  Administered 2015-07-24: 500 mL
  Filled 2015-07-24: qty 1.2

## 2015-07-24 MED ORDER — SUCCINYLCHOLINE CHLORIDE 20 MG/ML IJ SOLN
INTRAMUSCULAR | Status: DC | PRN
  Administered 2015-07-24: 100 mg via INTRAVENOUS

## 2015-07-24 MED ORDER — LIDOCAINE HCL (CARDIAC) 20 MG/ML IV SOLN
INTRAVENOUS | Status: DC | PRN
  Administered 2015-07-24: 50 mg via INTRAVENOUS

## 2015-07-24 MED ORDER — FENTANYL CITRATE (PF) 100 MCG/2ML IJ SOLN: 25.0000 ug | INTRAMUSCULAR | Status: DC | PRN

## 2015-07-24 MED ORDER — ONDANSETRON 4 MG PO TBDP
4.0000 mg | ORAL_TABLET | Freq: Four times a day (QID) | ORAL | Status: DC | PRN
  Administered 2015-07-27: 4 mg via ORAL
  Filled 2015-07-24 (×2): qty 1

## 2015-07-24 MED ORDER — CEFAZOLIN SODIUM-DEXTROSE 2-3 GM-% IV SOLR: 2.0000 g | INTRAVENOUS | Status: DC

## 2015-07-24 MED ORDER — ONDANSETRON HCL 4 MG/2ML IJ SOLN
4.0000 mg | Freq: Four times a day (QID) | INTRAMUSCULAR | Status: DC | PRN
  Administered 2015-07-27 – 2015-07-29 (×3): 4 mg via INTRAVENOUS
  Filled 2015-07-24 (×2): qty 2

## 2015-07-24 MED ORDER — LACTATED RINGERS IV SOLN
INTRAVENOUS | Status: DC
  Administered 2015-07-24 – 2015-07-27 (×6): via INTRAVENOUS

## 2015-07-24 MED ORDER — ALBUMIN HUMAN 25 % IV SOLN
50.0000 g | Freq: Once | INTRAVENOUS | Status: DC
  Filled 2015-07-24: qty 200

## 2015-07-24 MED ORDER — FENTANYL CITRATE (PF) 100 MCG/2ML IJ SOLN
INTRAMUSCULAR | Status: DC | PRN
  Administered 2015-07-24: 25 ug via INTRAVENOUS
  Administered 2015-07-24: 50 ug via INTRAVENOUS
  Administered 2015-07-24: 25 ug via INTRAVENOUS

## 2015-07-24 MED ORDER — MIDAZOLAM HCL 2 MG/2ML IJ SOLN
INTRAMUSCULAR | Status: AC
  Filled 2015-07-24: qty 2

## 2015-07-24 MED ORDER — ROCURONIUM BROMIDE 100 MG/10ML IV SOLN
INTRAVENOUS | Status: AC
  Filled 2015-07-24: qty 1

## 2015-07-24 MED ORDER — ALBUMIN HUMAN 25 % IV SOLN
50.0000 g | Freq: Once | INTRAVENOUS | Status: AC
  Administered 2015-07-25: 50 g via INTRAVENOUS
  Filled 2015-07-24 (×3): qty 200

## 2015-07-24 MED ORDER — PROPOFOL 10 MG/ML IV BOLUS
INTRAVENOUS | Status: DC | PRN
  Administered 2015-07-24: 160 mg via INTRAVENOUS

## 2015-07-24 MED ORDER — CEFAZOLIN SODIUM-DEXTROSE 2-3 GM-% IV SOLR
INTRAVENOUS | Status: AC
  Filled 2015-07-24: qty 50

## 2015-07-24 MED ORDER — ONDANSETRON HCL 4 MG/2ML IJ SOLN
INTRAMUSCULAR | Status: DC | PRN
  Administered 2015-07-24: 4 mg via INTRAVENOUS

## 2015-07-24 MED ORDER — PANTOPRAZOLE SODIUM 40 MG IV SOLR
40.0000 mg | Freq: Every day | INTRAVENOUS | Status: DC
  Administered 2015-07-24 – 2015-07-28 (×5): 40 mg via INTRAVENOUS
  Filled 2015-07-24 (×5): qty 40

## 2015-07-24 MED ORDER — PROMETHAZINE HCL 25 MG/ML IJ SOLN: 6.2500 mg | INTRAMUSCULAR | Status: DC | PRN

## 2015-07-24 SURGICAL SUPPLY — 37 items
BAG DECANTER FOR FLEXI CONT (MISCELLANEOUS) ×3 IMPLANT
BENZOIN TINCTURE PRP APPL 2/3 (GAUZE/BANDAGES/DRESSINGS) ×3 IMPLANT
BLADE HEX COATED 2.75 (ELECTRODE) ×3 IMPLANT
BLADE SURG 15 STRL LF DISP TIS (BLADE) ×1 IMPLANT
BLADE SURG 15 STRL SS (BLADE) ×2
CHLORAPREP W/TINT 26ML (MISCELLANEOUS) ×3 IMPLANT
CLOSURE WOUND 1/2 X4 (GAUZE/BANDAGES/DRESSINGS) ×1
COVER SURGICAL LIGHT HANDLE (MISCELLANEOUS) ×3 IMPLANT
DECANTER SPIKE VIAL GLASS SM (MISCELLANEOUS) ×3 IMPLANT
DRAPE C-ARM 42X120 X-RAY (DRAPES) ×3 IMPLANT
DRAPE LAPAROSCOPIC ABDOMINAL (DRAPES) ×3 IMPLANT
DRSG TEGADERM 4X4.75 (GAUZE/BANDAGES/DRESSINGS) ×3 IMPLANT
ELECT PENCIL ROCKER SW 15FT (MISCELLANEOUS) ×3 IMPLANT
ELECT REM PT RETURN 9FT ADLT (ELECTROSURGICAL) ×3
ELECTRODE REM PT RTRN 9FT ADLT (ELECTROSURGICAL) ×1 IMPLANT
GAUZE SPONGE 4X4 12PLY STRL (GAUZE/BANDAGES/DRESSINGS) ×3 IMPLANT
GAUZE SPONGE 4X4 16PLY XRAY LF (GAUZE/BANDAGES/DRESSINGS) ×3 IMPLANT
GLOVE BIOGEL PI IND STRL 7.5 (GLOVE) ×1 IMPLANT
GLOVE BIOGEL PI INDICATOR 7.5 (GLOVE) ×2
GLOVE EUDERMIC 7 POWDERFREE (GLOVE) ×3 IMPLANT
GLOVE SURG SS PI 7.5 STRL IVOR (GLOVE) ×3 IMPLANT
GOWN STRL REIN 2XL XLG LVL4 (GOWN DISPOSABLE) ×3 IMPLANT
GOWN STRL REUS W/TWL XL LVL3 (GOWN DISPOSABLE) ×3 IMPLANT
KIT BASIN OR (CUSTOM PROCEDURE TRAY) ×3 IMPLANT
KIT PORT POWER 8FR ISP CVUE (Catheter) ×3 IMPLANT
MARKER SKIN DUAL TIP RULER LAB (MISCELLANEOUS) ×3 IMPLANT
NEEDLE HYPO 22GX1.5 SAFETY (NEEDLE) ×3 IMPLANT
PACK BASIC VI WITH GOWN DISP (CUSTOM PROCEDURE TRAY) ×3 IMPLANT
STRIP CLOSURE SKIN 1/2X4 (GAUZE/BANDAGES/DRESSINGS) ×2 IMPLANT
SUT MNCRL AB 4-0 PS2 18 (SUTURE) ×3 IMPLANT
SUT PROLENE 2 0 SH DA (SUTURE) ×6 IMPLANT
SUT VIC AB 3-0 SH 18 (SUTURE) ×3 IMPLANT
SYR 20CC LL (SYRINGE) ×3 IMPLANT
SYR BULB IRRIGATION 50ML (SYRINGE) IMPLANT
SYRINGE 10CC LL (SYRINGE) ×9 IMPLANT
TOWEL OR 17X26 10 PK STRL BLUE (TOWEL DISPOSABLE) ×3 IMPLANT
TOWEL OR NON WOVEN STRL DISP B (DISPOSABLE) ×3 IMPLANT

## 2015-07-24 NOTE — Progress Notes (Signed)
[facility] PROGRESS NOTE  Dawn Haynes [id number] DOB: [birth year] DOA: 07/17/2015 PCP: Mora Bellman, MD  Brief narrative 35 y/o female with recently diagnosed left breast cancer. she felt a lump in her left upper outer quadrant for almost 1 year which is slowly increasing in size and becoming painful. A mammogram and ultrasound showed 5.3 cm mass with abnormal left axillary lymph nodes. Biopsy was positive for high-grade triple negative breast cancer. She was presented to the tumor board on 1/4 and then saw her oncologist Dr. [name] the same day. She was found to be tachycardic and tachypneic with abdominal distention (which she informs for the past 1 month). She was then sent to the ED for further management and paracentesis. ED she underwent diagnostic and therapeutic paracentesis with about 6 L bloody fluid removed. It showed about 20,00 white cells. She was septic with tachycardia, tachypnea and low blood pressure with markedly elevated lactic acid (5.68) She was placed on IV vancomycin and Zosyn concern for SBP and sepsis and admitted to stepdown unit.   Assessment/plan  Sepsis  Likely secondary to  SIRS due to high tumor burden with liver metastases and malignant ascites. Initial concern for SBP on presentation. Received aggressive hydration for persistent lactic acid elevation which was due to high tumor burden and liver failure. All cultures have been negative. We'll discontinue antibiotics (has received total 8 days already)   Metastatic left breast cancer Grade 3 invasive ductal carcinoma per pathology, Triple negative. CT abdomen and pelvis done on 1/4 showing enlarged liver largely replaced by extensive confluent metastatic disease with large volume ascites mainly in the pelvic area. Also has mass effect on the right kidney by and large right liver lobe. - Dr. [name] following.  Plan for port placement by surgery on 1/11 and starting chemotherapy on 1/12.    Metastatic  liver disease with ascites . Hepatitis panel is negative. CT abdomen shows  enlarged liver which is  largely replaced by extensive confluent metastatic disease. s/p paracentesis with 6L removal on 1/4 and 5.7 L removal on 1/7. S/p IV albumin x 2. -unfortunately, liver function has been declining.  ascitic fluid cytology is negative for malignant cells (shows reactive mesothelial cells). Plan to continue with therapeutic paracentesis for now. She has abdominal distention and will need another large-volume paracentesis tomorrow.   Hyponatremia  Likely hypovolemic. Also received Lasix while in the hospital. Discontinued fluid and Lasix. Monitor after paracentesis tomorrow.  Protein calorie malnutrition  Dietitian consulted  Sinus tachycardia  likely in the setting of extensive metastatic disease, she is not in  pain. - TSH normal   Prognosis is guarded due to advanced, extensive liver mets with breast cancer at risk for multiorgan dysfunction.  If no improvement with chemotherapy she may need palliative care evaluation.  Code Status: Full code Family Communication: None at bedside Disposition Plan: Continue inpatient monitoring   Consultants:  Dr. Carolyn Stare surgery ( for [facility] cath placement)  Procedures:  CT abdomen and pelvis  Antibiotics:  IV Zosyn since 1/4--  Labs: Paracentesis on 1/4 and 1/7  HPI/Subjective: Seen and examined. Reports feeling very hungry. Denies abdominal pain or shortness of breath.  Objective: Filed Vitals:   07/24/15 0206 07/24/15 0542  BP: 107/82 109/79  Pulse: 133 122  Temp: 97.4 F (36.3 C) 97.8 F (36.6 C)  Resp: 18 18    Intake/Output Summary (Last 24 hours) at 07/24/15 1238 Last data filed at 07/23/15 2300  Gross per 24 hour  Intake  290 ml  Output      0 ml  Net    290 ml   Filed Weights   07/17/15 1050 07/18/15 0357 07/20/15 0424  Weight: 73.029 kg (161 lb) 73.4 kg (161 lb 13.1 oz) 82.5 kg (181 lb 14.1 oz)     Exam:   General:  Middle-aged female appears fatigued,  Cardiovascular: S1 and S2 tachycardic, no murmurs  Respiratory: Clear bilaterally, no added sounds  Abdomen: Distended with ascites, nontender,,  Musculoskeletal: Warm, trace edema bilaterally  CNS: Alert and oriented  Data Reviewed: Basic Metabolic Panel:  Recent Labs Lab 07/20/15 0334 07/21/15 0500 07/22/15 0403 07/23/15 0350 07/24/15 0515  NA 131* 129* 128* 125* 126*  K 4.3 4.7 4.4 5.1 5.0  CL 98* 96* 93* 90* 89*  CO2 20* 21* 24 24 26   GLUCOSE 115* 98 121* 94 113*  BUN 13 15 19  21* 25*  CREATININE 0.62 0.60 0.65 0.71 0.79  CALCIUM 8.5* 8.9 8.9 8.8* 8.9   Liver Function Tests:  Recent Labs Lab 07/17/15 1255 07/20/15 0334 07/21/15 0500 07/23/15 0350 07/24/15 0515  AST 412* 495* 536* 736* 714*  ALT 76* 156* 164* 233* 227*  ALKPHOS 262* 383* 359* 498* 548*  BILITOT 1.4* 1.7* 2.2* 1.8* 1.2  PROT 5.4* 5.5* 5.3* 5.2* 5.5*  ALBUMIN 2.2* 2.8* 3.0* 2.3* 2.3*   No results for input(s): LIPASE, AMYLASE in the last 168 hours. No results for input(s): AMMONIA in the last 168 hours. CBC:  Recent Labs Lab 07/18/15 0549 07/19/15 0340 07/21/15 0500 07/23/15 0350 07/24/15 0515  WBC 11.8* 10.8* 13.8* 15.6* 16.6*  HGB 10.9* 10.8* 11.1* 12.1 11.5*  HCT 34.9* 34.5* 34.2* 37.6 35.6*  MCV 86.8 87.1 84.4 84.1 82.8  PLT 433* 457* 444* 454* 455*   Cardiac Enzymes: No results for input(s): CKTOTAL, CKMB, CKMBINDEX, TROPONINI in the last 168 hours. BNP (last 3 results) No results for input(s): BNP in the last 8760 hours.  ProBNP (last 3 results) No results for input(s): PROBNP in the last 8760 hours.  CBG: No results for input(s): GLUCAP in the last 168 hours.  Recent Results (from the past 240 hour(s))  Blood Culture (routine x 2)     Status: None   Collection Time: 07/17/15 10:40 AM  Result Value Ref Range Status   Specimen Description BLOOD RIGHT ANTECUBITAL  Final   Special Requests BOTTLES DRAWN  AEROBIC AND ANAEROBIC 5ML  Final   Culture   Final    NO GROWTH 5 DAYS Performed at [facility]    Report Status 07/22/2015 FINAL  Final  Blood Culture (routine x 2)     Status: None   Collection Time: 07/17/15 11:00 AM  Result Value Ref Range Status   Specimen Description BLOOD LEFT FOREARM  Final   Special Requests BOTTLES DRAWN AEROBIC ONLY 5ML  Final   Culture   Final    NO GROWTH 5 DAYS Performed at [facility]    Report Status 07/22/2015 FINAL  Final  Culture, body fluid-bottle     Status: None   Collection Time: 07/17/15  3:47 PM  Result Value Ref Range Status   Specimen Description FLUID PERITONEAL  Final   Special Requests BOTTLES DRAWN AEROBIC AND ANAEROBIC 10CC  Final   Culture   Final    NO GROWTH 5 DAYS Performed at [facility]    Report Status 07/22/2015 FINAL  Final  Gram stain     Status: None   Collection Time: 07/17/15  3:47 PM  Result Value Ref Range Status   Specimen Description FLUID PERITONEAL  Final   Special Requests NONE  Final   Gram Stain   Final    CYTOSPIN SMEAR WBC PRESENT,BOTH PMN AND MONONUCLEAR NO ORGANISMS SEEN Gram Stain Report Called to,Read Back By and Verified With: FAITH HASZ RN 1.4.17 @ [id number] BY RICEJ Performed at [facility]    Report Status 07/17/2015 FINAL  Final  Urine culture     Status: None   Collection Time: 07/17/15  8:09 PM  Result Value Ref Range Status   Specimen Description URINE, CLEAN CATCH  Final   Special Requests NONE  Final   Culture   Final    NO GROWTH 1 DAY Performed at [facility]    Report Status 07/19/2015 FINAL  Final  MRSA PCR Screening     Status: None   Collection Time: 07/18/15  3:59 AM  Result Value Ref Range Status   MRSA by PCR NEGATIVE NEGATIVE Final    Comment:        The GeneXpert MRSA Assay (FDA approved for NASAL specimens only), is one component of a comprehensive MRSA colonization surveillance program. It is not intended to diagnose  MRSA infection nor to guide or monitor treatment for MRSA infections.      Studies: No results found.  Scheduled Meds: . acidophilus  1 capsule Oral Daily  .  ceFAZolin (ANCEF) IV  2 g Intravenous On Call to OR  . chlorhexidine  1 application Topical Once  . piperacillin-tazobactam (ZOSYN)  IV  3.375 g Intravenous Q8H  . senna-docusate  2 tablet Oral Daily  . vitamin B-12  100 mcg Oral Daily   Continuous Infusions:   Principal Problem:   SBP (spontaneous bacterial peritonitis) (HCC) Active Problems:   Breast cancer of upper-outer quadrant of left female breast (HCC)   Ascites   Peritonitis (HCC)   Abdominal pain   SOB (shortness of breath)   DDD (degenerative disc disease), lumbar   Severe sepsis ([place])   Sepsis ([place])    Time spent:25 minutes    Holland Nickson  [facility] Pager [phone] If 7PM-7AM, please contact night-coverage at [email or web address], password [facility] 07/24/2015, 12:38 PM  LOS: 7 days

## 2015-07-24 NOTE — Op Note (Signed)
Patient Name:           Dawn Haynes   Date of Surgery:        07/24/2015  Pre op Diagnosis:      Stage IV breast cancer  Post op Diagnosis:    Stage IV breast cancer  Procedure:                 Insertion of 8 French power port clearVue  tunneled vascular access device using fluoroscopy for guidance and positioning  Surgeon:                     Edsel Petrin. Dalbert Batman, M.D., FACS  Assistant:                      OR staff  Operative Indications:   This is a 35 year old female who was recently evaluated for abdominal discomfort and found to have hepatomegaly.  She was referred to gastroenterology as an outpatient.  She was evaluated for a large left breast mass and axillary adenopathy.  Biopsy showed invasive ductal carcinoma, grade 3, triple negative breast cancer.  The left breast masses 5.3 cm in diameter.  Referred to the breast clinic and when we evaluated her one week ago she was found to be in respiratory distress with a respiratory rate of 40, heart rate of 130, and distended abdomen with ascites.  She was admitted.   She was found to have lactic acidosis and was thought to be septic but all of her cultures are negative.  She underwent paracentesis on 2 separate occasions removing a total of 12 L of fluid.  CT scan showed that the liver is mostly replaced by tumor consistent with stage IV breast cancer.  INR is normal.  Albumin is 2.4.  Platelet count is normal.  Bilirubin is less than 2.  Dr. [name] has offered palliative chemotherapy and he asked me to put a port in her.  I counseled her regarding this and she is brought to the operating room for Port-A-Cath insertion and the plan is to begin inpatient chemotherapy tomorrow.  Operative Findings:       The catheter was placed through the right subclavian vein without any difficulty.  The catheter tip appears to be in the superior vena cava and flushes easily and has excellent blood return.  The catheter was left accessed.  Procedure in Detail:           Following the induction of general endotracheal anesthesia the patient was positioned with a roll behind her shoulders and her arms tucked at her sides.  The neck and chest was prepped and draped in a sterile fashion.  Intravenous antibiotics were given.  Surgical timeout was performed.  0.5% Marcaine with epinephrine was used as a local infiltration anesthetic.     A right subclavian venipuncture was performed with good blood return.  Guidewire was threaded into this.  Vena cava under fluoroscopic guidance.  Using fluoroscopy I drew a template on the chest wall to guide catheter length and position.  A small incision was made at the wire insertion site.  A second transverse incision was made below the midpoint of the right clavicle.  Subcutaneous pocket was created.  The catheter was passed from the wire insertion site to the port pocket site.  Using the template measurements I measured the catheter and cut it 20 cm in length.  The catheter was secured to the port with the locking device and  the port and catheter flushed with heparinized saline.  The port was sutured to the pectoralis fashion with 3 interrupted sutures of 2-0 Prolene.  The dilator and peel-away sheath assembly was passed over the guidewire and then the dilator and wire were removed.  The catheter was threaded easily through the.peel-away sheath and the peel-away sheath was removed.  Fluoroscopy confirmed that the catheter tip was in the superior  vena cava just above the right atrium and there is no deformity of the catheter.  The catheter was flushed with heparinized saline.  There was no bleeding.  Subcutaneous tissue was closed with 3-0 Vicryl sutures and skin closed with a running subcutaneous taken of 4-0 Monocryl and Steri-Strips.     We used the angled Huber needle Swedged on to the IV access tubing that came with the kit.  This was flushed with heparinized saline.  The needle was passed into the port through the skin.  It flushed  easily and had excellent blood return.  The entire assembly was flushed with concentrated heparin.  Steri-Strips were used to hold the extension tubing in place and a clean Tegaderm dressing placed.  Patient tolerated the procedure well was taken to PACU in stable condition where a chest x-ray will be obtained.  The patient tolerated the procedure well without any complications.  EBL 10 mL.  Counts correct.     Edsel Petrin. Dalbert Batman, M.D., FACS General and Minimally Invasive Surgery Breast and Colorectal Surgery  07/24/2015 3:59 PM

## 2015-07-24 NOTE — Anesthesia Preprocedure Evaluation (Signed)
Anesthesia Evaluation  Patient identified by MRN, date of birth, ID band Patient awake    Reviewed: Allergy & Precautions, NPO status , Patient's Chart, lab work & pertinent test results  Airway Mallampati: II  TM Distance: >3 FB Neck ROM: Full    Dental no notable dental hx.    Pulmonary shortness of breath,    Pulmonary exam normal breath sounds clear to auscultation       Cardiovascular negative cardio ROS Normal cardiovascular exam Rhythm:Regular Rate:Normal     Neuro/Psych negative neurological ROS  negative psych ROS   GI/Hepatic negative GI ROS, Malignant ascites   Endo/Other  negative endocrine ROS  Renal/GU negative Renal ROS  negative genitourinary   Musculoskeletal negative musculoskeletal ROS (+)   Abdominal   Peds negative pediatric ROS (+)  Hematology negative hematology ROS (+)   Anesthesia Other Findings Stage 4 breast cancer. Malignant ascites  Reproductive/Obstetrics negative OB ROS                             Anesthesia Physical Anesthesia Plan  ASA: IV  Anesthesia Plan: General   Post-op Pain Management:    Induction: Intravenous, Rapid sequence and Cricoid pressure planned  Airway Management Planned: Oral ETT  Additional Equipment:   Intra-op Plan:   Post-operative Plan: Extubation in OR  Informed Consent: I have reviewed the patients History and Physical, chart, labs and discussed the procedure including the risks, benefits and alternatives for the proposed anesthesia with the patient or authorized representative who has indicated his/her understanding and acceptance.   Dental advisory given  Plan Discussed with: CRNA  Anesthesia Plan Comments:         Anesthesia Quick Evaluation

## 2015-07-24 NOTE — H&P (View-Only) (Signed)
General surgery attending:  Patient is stable.  Tolerating diet.  Perhaps a little more discomfort from abdominal distention Exam reveals she is alert and cooperative.  Afebrile.  Heart rate 114.  SPO2 100% on room air. Abdomen distended but soft and nontender.  Lab work reveals hemoglobin 12.1, WBC 15,600, platelet count 454,000.  INR 1.24.  PTT 31.  Total bilirubin 1.8.  Albumin 2.3.  Creatinine 0.71.  Potassium 5.1.  She is at increased risk for any surgical procedure due to protein calorie malnutrition, stage IV breast cancer with extensive metastasis and ascites, and impaired liver function.  She is ready to proceed with Port-A-Cath insertion in the operating room tomorrow.  This will be in the mid afternoon some time.  Once again, we discussed the indications, techniques, and numerous risk of the surgery.  She is comfortable with all of this.  All of her questions are answered.  She agrees with this plan.   Edsel Petrin. Dalbert Batman, M.D., [facility] General and Minimally invasive Surgery Breast and Colorectal Surgery Office:   [phone]

## 2015-07-24 NOTE — Anesthesia Procedure Notes (Signed)
Procedure Name: Intubation Date/Time: 07/24/2015 3:11 PM Performed by: Noralyn Pick D Pre-anesthesia Checklist: Patient identified, Emergency Drugs available, Suction available and Patient being monitored Patient Re-evaluated:Patient Re-evaluated prior to inductionOxygen Delivery Method: Circle System Utilized Preoxygenation: Pre-oxygenation with 100% oxygen Intubation Type: IV induction, Rapid sequence and Cricoid Pressure applied Laryngoscope Size: Mac and 4 Grade View: Grade I Tube type: Oral Tube size: 7.5 mm Number of attempts: 1 Airway Equipment and Method: Stylet and Oral airway Placement Confirmation: ETT inserted through vocal cords under direct vision,  positive ETCO2 and breath sounds checked- equal and bilateral Secured at: 20 cm Tube secured with: Tape Dental Injury: Teeth and Oropharynx as per pre-operative assessment

## 2015-07-24 NOTE — Transfer of Care (Signed)
Immediate Anesthesia Transfer of Care Note  Patient: Dawn Haynes  Procedure(s) Performed: Procedure(s): INSERTION PORT-A-CATH RIGHT SUBCLAVIAN (Right)  Patient Location: PACU  Anesthesia Type:General  Level of Consciousness: awake, alert  and oriented  Airway & Oxygen Therapy: Patient Spontanous Breathing and Patient connected to face mask oxygen  Post-op Assessment: Report given to RN and Post -op Vital signs reviewed and stable  Post vital signs: Reviewed and stable  Last Vitals:  Filed Vitals:   07/24/15 0542 07/24/15 1332  BP: 109/79 94/78  Pulse: 122 115  Temp: 36.6 C 36.5 C  Resp: 18 18    Complications: No apparent anesthesia complications

## 2015-07-24 NOTE — Progress Notes (Signed)
Chemotherapy consent signed by patient for Gemcitabine and Carboplatin.  Placed in patient's chart.

## 2015-07-24 NOTE — Interval H&P Note (Signed)
History and Physical Interval Note:  07/24/2015 2:28 PM  Dawn Haynes  has presented today for surgery, with the diagnosis of stage IV breast cancer  The various methods of treatment have been discussed with the patient and family. After consideration of risks, benefits and other options for treatment, the patient has consented to  Procedure(s): INSERTION PORT-A-CATH WITH ULTRA SOUND (N/A) as a surgical intervention .  The patient's history has been reviewed, patient examined, no change in status, stable for surgery.  I have reviewed the patient's chart and labs.  Questions were answered to the patient's satisfaction.     [name]

## 2015-07-25 ENCOUNTER — Inpatient Hospital Stay (HOSPITAL_COMMUNITY): Payer: Medicaid Other

## 2015-07-25 DIAGNOSIS — R748 Abnormal levels of other serum enzymes: Secondary | ICD-10-CM

## 2015-07-25 DIAGNOSIS — C50919 Malignant neoplasm of unspecified site of unspecified female breast: Secondary | ICD-10-CM

## 2015-07-25 DIAGNOSIS — E875 Hyperkalemia: Secondary | ICD-10-CM

## 2015-07-25 DIAGNOSIS — E871 Hypo-osmolality and hyponatremia: Secondary | ICD-10-CM

## 2015-07-25 LAB — COMPREHENSIVE METABOLIC PANEL
ALK PHOS: 514 U/L — AB (ref 38–126)
ALT: 184 U/L — ABNORMAL HIGH (ref 14–54)
ANION GAP: 10 (ref 5–15)
AST: 629 U/L — ABNORMAL HIGH (ref 15–41)
Albumin: 2.2 g/dL — ABNORMAL LOW (ref 3.5–5.0)
BUN: 26 mg/dL — ABNORMAL HIGH (ref 6–20)
CALCIUM: 8.8 mg/dL — AB (ref 8.9–10.3)
CO2: 28 mmol/L (ref 22–32)
Chloride: 89 mmol/L — ABNORMAL LOW (ref 101–111)
Creatinine, Ser: 0.69 mg/dL (ref 0.44–1.00)
GFR calc Af Amer: >60 mL/min (ref >60)
GFR calc non Af Amer: >60 mL/min (ref >60)
GLUCOSE: 120 mg/dL — AB (ref 65–99)
POTASSIUM: 5.5 mmol/L — AB (ref 3.5–5.1)
SODIUM: 127 mmol/L — AB (ref 135–145)
Total Bilirubin: 1.5 mg/dL — ABNORMAL HIGH (ref 0.3–1.2)
Total Protein: 5.5 g/dL — ABNORMAL LOW (ref 6.5–8.1)

## 2015-07-25 LAB — CBC
HCT: 34.5 % — ABNORMAL LOW (ref 36.0–46.0)
HEMOGLOBIN: 11.1 g/dL — AB (ref 12.0–15.0)
MCH: 26.9 pg (ref 26.0–34.0)
MCHC: 32.2 g/dL (ref 30.0–36.0)
MCV: 83.5 fL (ref 78.0–100.0)
PLATELETS: 473 10*3/uL — AB (ref 150–400)
RBC: 4.13 MIL/uL (ref 3.87–5.11)
RDW: 14.5 % (ref 11.5–15.5)
WBC: 18.8 10*3/uL — ABNORMAL HIGH (ref 4.0–10.5)

## 2015-07-25 LAB — URIC ACID: URIC ACID, SERUM: 5.4 mg/dL (ref 2.3–6.6)

## 2015-07-25 MED ORDER — SODIUM CHLORIDE 0.9 % IJ SOLN: 10.0000 mL | INTRAMUSCULAR | Status: DC | PRN

## 2015-07-25 MED ORDER — SODIUM CHLORIDE 0.9 % IV SOLN
800.0000 mg/m2 | Freq: Once | INTRAVENOUS | Status: AC
  Administered 2015-07-25: 1520 mg via INTRAVENOUS
  Filled 2015-07-25: qty 40

## 2015-07-25 MED ORDER — HEPARIN SOD (PORK) LOCK FLUSH 100 UNIT/ML IV SOLN
250.0000 [IU] | Freq: Once | INTRAVENOUS | Status: DC | PRN
  Filled 2015-07-25: qty 3

## 2015-07-25 MED ORDER — COLD PACK MISC ONCOLOGY
1.0000 | Freq: Once | Status: AC | PRN
  Filled 2015-07-25: qty 1

## 2015-07-25 MED ORDER — SODIUM CHLORIDE 0.9 % IV SOLN
10.0000 mg | Freq: Once | INTRAVENOUS | Status: AC
  Administered 2015-07-25: 10 mg via INTRAVENOUS
  Filled 2015-07-25: qty 1

## 2015-07-25 MED ORDER — CARBOPLATIN CHEMO INJECTION 450 MG/45ML
300.0000 mg | Freq: Once | INTRAVENOUS | Status: AC
  Administered 2015-07-25: 300 mg via INTRAVENOUS
  Filled 2015-07-25: qty 30

## 2015-07-25 MED ORDER — SODIUM CHLORIDE 0.9 % IV SOLN
Freq: Once | INTRAVENOUS | Status: AC
  Administered 2015-07-25: 15:00:00 via INTRAVENOUS

## 2015-07-25 MED ORDER — ALTEPLASE 2 MG IJ SOLR
2.0000 mg | Freq: Once | INTRAMUSCULAR | Status: DC | PRN
  Filled 2015-07-25: qty 2

## 2015-07-25 MED ORDER — SODIUM CHLORIDE 0.9 % IJ SOLN
10.0000 mL | Freq: Two times a day (BID) | INTRAMUSCULAR | Status: DC
  Administered 2015-07-27 (×2): 10 mL
  Administered 2015-07-28: 40 mL
  Administered 2015-07-28: 10 mL

## 2015-07-25 MED ORDER — HEPARIN SOD (PORK) LOCK FLUSH 100 UNIT/ML IV SOLN
500.0000 [IU] | Freq: Once | INTRAVENOUS | Status: DC | PRN
  Filled 2015-07-25: qty 5

## 2015-07-25 MED ORDER — SODIUM CHLORIDE 0.9 % IJ SOLN
10.0000 mL | INTRAMUSCULAR | Status: DC | PRN
  Administered 2015-07-26: 10 mL
  Filled 2015-07-25: qty 40

## 2015-07-25 MED ORDER — SODIUM CHLORIDE 0.9 % IJ SOLN: 3.0000 mL | INTRAMUSCULAR | Status: DC | PRN

## 2015-07-25 MED ORDER — PALONOSETRON HCL INJECTION 0.25 MG/5ML
0.2500 mg | Freq: Once | INTRAVENOUS | Status: AC
  Administered 2015-07-25: 0.25 mg via INTRAVENOUS
  Filled 2015-07-25: qty 5

## 2015-07-25 MED ORDER — SODIUM POLYSTYRENE SULFONATE 15 GM/60ML PO SUSP
30.0000 g | Freq: Once | ORAL | Status: AC
  Administered 2015-07-25: 30 g via ORAL
  Filled 2015-07-25: qty 120

## 2015-07-25 NOTE — Progress Notes (Signed)
Pt tolerated infusions of Gemzar and Carboplatin without incident.  [place] staff, patient and family instructed on chemo precautions.

## 2015-07-25 NOTE — Anesthesia Postprocedure Evaluation (Signed)
Anesthesia Post Note  Patient: [name]  Procedure(s) Performed: Procedure(s) (LRB): INSERTION PORT-A-CATH RIGHT SUBCLAVIAN (Right)  Patient location during evaluation: PACU Anesthesia Type: General Level of consciousness: awake and alert Pain management: pain level controlled Vital Signs Assessment: post-procedure vital signs reviewed and stable Respiratory status: spontaneous breathing, nonlabored ventilation, respiratory function stable and patient connected to nasal cannula oxygen Cardiovascular status: blood pressure returned to baseline and stable Postop Assessment: no signs of nausea or vomiting Anesthetic complications: no    Last Vitals:  Filed Vitals:   07/25/15 0209 07/25/15 0537  BP: 110/81 112/75  Pulse: 100 98  Temp: 36.7 C 36.4 C  Resp: 30 22    Last Pain:  Filed Vitals:   07/25/15 0824  PainSc: 0-No pain                 Montez Hageman

## 2015-07-25 NOTE — Progress Notes (Signed)
HEMATOLOGY-ONCOLOGY PROGRESS NOTE  SUBJECTIVE: Patient received first cycle of chemotherapy with carboplatin and gemcitabine today, she also underwent paracentesis  OBJECTIVE: PHYSICAL EXAMINATION: ECOG PERFORMANCE STATUS: 3 - Symptomatic, >50% confined to bed  Filed Vitals:   07/25/15 1639 07/25/15 1658  BP: 120/92 117/78  Pulse:  99  Temp:  98.1 F (36.7 C)  Resp:  16   Filed Weights   07/17/15 1050 07/18/15 0357 07/20/15 0424  Weight: 161 lb (73.029 kg) 161 lb 13.1 oz (73.4 kg) 181 lb 14.1 oz (82.5 kg)    GENERAL:alert, no distress and comfortable SKIN: skin color, texture, turgor are normal, no rashes or significant lesions OROPHARYNX:no exudate, no erythema and lips, buccal mucosa, and tongue normal  LUNGS: clear to auscultation and percussion with normal breathing effort HEART: regular rate & rhythm and no murmurs and no lower extremity edema ABDOMEN: Less distended Musculoskeletal:no cyanosis of digits and no clubbing  NEURO: alert & oriented x 3 with fluent speech, no focal motor/sensory deficits  LABORATORY DATA:  I have reviewed the data as listed CMP Latest Ref Rng 07/25/2015 07/24/2015 07/23/2015  Glucose 65 - 99 mg/dL 120(H) 113(H) 94  BUN 6 - 20 mg/dL 26(H) 25(H) 21(H)  Creatinine 0.44 - 1.00 mg/dL 0.69 0.79 0.71  Sodium 135 - 145 mmol/L 127(L) 126(L) 125(L)  Potassium 3.5 - 5.1 mmol/L 5.5(H) 5.0 5.1  Chloride 101 - 111 mmol/L 89(L) 89(L) 90(L)  CO2 22 - 32 mmol/L 28 26 24   Calcium 8.9 - 10.3 mg/dL 8.8(L) 8.9 8.8(L)  Total Protein 6.5 - 8.1 g/dL 5.5(L) 5.5(L) 5.2(L)  Total Bilirubin 0.3 - 1.2 mg/dL 1.5(H) 1.2 1.8(H)  Alkaline Phos 38 - 126 U/L 514(H) 548(H) 498(H)  AST 15 - 41 U/L 629(H) 714(H) 736(H)  ALT 14 - 54 U/L 184(H) 227(H) 233(H)    Lab Results  Component Value Date   WBC 18.8* 07/25/2015   HGB 11.1* 07/25/2015   HCT 34.5* 07/25/2015   MCV 83.5 07/25/2015   PLT 473* 07/25/2015   NEUTROABS 5.6 07/17/2015    ASSESSMENT AND PLAN: 1.  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 She tolerated it extremely well without any nausea or any other complaints. 2. Severe ascites: Patient underwent a third paracentesis today. 3. Elevated liver enzymes AST 629 and ALT 184 with alkaline phosphatase of 514 with a bilirubin of 1.5: Secondary to metastatic disease. 4. Hyperkalemia: Patient received Kayexalate today. I discussed with her that in order to allow her to go home, we might have to have interventional radiology place a drain so that she can drain her ascites fluid at home with the help of home health care. I also recommended consultation with palliative care to help her with symptoms as well as to fully understand the prognosis and implications of treatment.  Patient understands that her cancer has poor prognosis and that the goal of treatment is palliation. She understands that with advanced liver involvement, prognosis could be as short as a few months. However patient indicated to me that she believes in prayer and positive thinking and would like to keep fighting as long as she is able to.  I will request interventional radiology for drain placement to be able to drain her peritoneal fluid by herself at home with the help of home health care.

## 2015-07-25 NOTE — Progress Notes (Signed)
Returned call to [facility] - inpatient pharmacy.  Per Dr. [name], admin Gemzar at 53 - do not hold for paracentesis.  Pt ok to treat with K+ 5.5 and T-bili.  Pt will need kayexalate.  11:30 Spoke with Jeannene Patella  - inpatient RN.  She will enter order for Kayexalate 30.

## 2015-07-25 NOTE — Progress Notes (Signed)
[facility] PROGRESS NOTE  Dawn Haynes [id number] DOB: 1981-03-17 DOA: 07/17/2015 PCP: Mora Bellman, MD  Brief narrative 35 y/o female with recently diagnosed left breast cancer. she felt a lump in her left upper outer quadrant for almost 1 year which is slowly increasing in size and becoming painful. A mammogram and ultrasound showed 5.3 cm mass with abnormal left axillary lymph nodes. Biopsy was positive for high-grade triple negative breast cancer. She was presented to the tumor board on 1/4 and then saw her oncologist Dr. [name] the same day. She was found to be tachycardic and tachypneic with abdominal distention (which she informs for the past 1 month). She was then sent to the ED for further management and paracentesis. ED she underwent diagnostic and therapeutic paracentesis with about 6 L bloody fluid removed. It showed about 20,00 white cells. She was septic with tachycardia, tachypnea and low blood pressure with markedly elevated lactic acid (5.68) She was placed on IV vancomycin and Zosyn concern for SBP and sepsis and admitted to stepdown unit.   Assessment/plan  Sepsis   secondary to  SIRS due to high tumor burden with liver metastases and malignant ascites. Initial concern for SBP on presentation. Received aggressive hydration for persistent lactic acid elevation which was due to high tumor burden and liver failure. All cultures have been negative. Antibiotics discontinued (received  total 8 days)   Metastatic left breast cancer Grade 3 invasive ductal carcinoma per pathology, Triple negative. CT abdomen and pelvis done on 1/4 showing enlarged liver largely replaced by extensive confluent metastatic disease with large volume ascites mainly in the pelvic area. Also has mass effect on the right kidney by and large right liver lobe. - Dr. [name] following.  Port A cath placed by surgery on 1/11 and scheduled to start chemotherapy today. Overall prognosis appears  poor.   Metastatic liver disease with ascites . Hepatitis panel is negative. CT abdomen shows  enlarged liver which is  largely replaced by extensive confluent metastatic disease. s/p paracentesis with 6L removal on 1/4 and 5.7 L removal on 1/7. S/p IV albumin x 2. -unfortunately, liver function has been declining.  ascitic fluid cytology is negative for malignant cells (shows reactive mesothelial cells). Plan to continue with therapeutic paracentesis for now. She has abdominal distention and will need another large-volume paracentesis tomorrow.   Hyponatremia  Likely hypovolemic. Also received Lasix while in the hospital. Discontinued fluid and Lasix.   Hyperkalemia Stable on telemetry. On it without intervention.   Protein calorie malnutrition  Dietitian consulted  Sinus tachycardia  likely in the setting of extensive metastatic disease. Not in pain at this time. TSH normal.    Prognosis is guarded due to advanced, extensive liver mets with breast cancer at risk for multiorgan dysfunction.  If no improvement with chemotherapy she may need palliative care evaluation.  Code Status: Full code Family Communication: Husband at bedside Disposition Plan: Continue inpatient monitoring   Consultants:  Dr. Carolyn Stare surgery ( for [facility] A cath placement)  Procedures:  CT abdomen and pelvis  Large-volume paracenteses 1/4, 1/7, 1/12  Antibiotics:  IV Zosyn since 1/4-1/11    HPI/Subjective: Seen and examined. Abdominal and progressively distended. Denies any pain.  Objective: Filed Vitals:   07/25/15 0537 07/25/15 1000  BP: 112/75 117/81  Pulse: 98 107  Temp: 97.5 F (36.4 C) 98.4 F (36.9 C)  Resp: 22 24    Intake/Output Summary (Last 24 hours) at 07/25/15 1315 Last data filed at 07/25/15 0700  Gross  per 24 hour  Intake 2113.33 ml  Output      0 ml  Net 2113.33 ml   Filed Weights   07/17/15 1050 07/18/15 0357 07/20/15 0424  Weight: 73.029 kg (161 lb)  73.4 kg (161 lb 13.1 oz) 82.5 kg (181 lb 14.1 oz)    Exam:   General:   fatigued,  Cardiovascular: S1 and S2 tachycardic, no murmurs  Respiratory: Clear bilaterally, no added sounds, right-sided Port-A-Cath.  Abdomen: Distended with ascites, nontender,,  Musculoskeletal: Warm, trace edema bilaterally  CNS: Alert and oriented  Data Reviewed: Basic Metabolic Panel:  Recent Labs Lab 07/21/15 0500 07/22/15 0403 07/23/15 0350 07/24/15 0515 07/25/15 0807  NA 129* 128* 125* 126* 127*  K 4.7 4.4 5.1 5.0 5.5*  CL 96* 93* 90* 89* 89*  CO2 21* 24 24 26 28   GLUCOSE 98 121* 94 113* 120*  BUN 15 19 21* 25* 26*  CREATININE 0.60 0.65 0.71 0.79 0.69  CALCIUM 8.9 8.9 8.8* 8.9 8.8*   Liver Function Tests:  Recent Labs Lab 07/20/15 0334 07/21/15 0500 07/23/15 0350 07/24/15 0515 07/25/15 0807  AST 495* 536* 736* 714* 629*  ALT 156* 164* 233* 227* 184*  ALKPHOS 383* 359* 498* 548* 514*  BILITOT 1.7* 2.2* 1.8* 1.2 1.5*  PROT 5.5* 5.3* 5.2* 5.5* 5.5*  ALBUMIN 2.8* 3.0* 2.3* 2.3* 2.2*   No results for input(s): LIPASE, AMYLASE in the last 168 hours. No results for input(s): AMMONIA in the last 168 hours. CBC:  Recent Labs Lab 07/19/15 0340 07/21/15 0500 07/23/15 0350 07/24/15 0515 07/25/15 0807  WBC 10.8* 13.8* 15.6* 16.6* 18.8*  HGB 10.8* 11.1* 12.1 11.5* 11.1*  HCT 34.5* 34.2* 37.6 35.6* 34.5*  MCV 87.1 84.4 84.1 82.8 83.5  PLT 457* 444* 454* 455* 473*   Cardiac Enzymes: No results for input(s): CKTOTAL, CKMB, CKMBINDEX, TROPONINI in the last 168 hours. BNP (last 3 results) No results for input(s): BNP in the last 8760 hours.  ProBNP (last 3 results) No results for input(s): PROBNP in the last 8760 hours.  CBG: No results for input(s): GLUCAP in the last 168 hours.  Recent Results (from the past 240 hour(s))  Blood Culture (routine x 2)     Status: None   Collection Time: 07/17/15 10:40 AM  Result Value Ref Range Status   Specimen Description BLOOD RIGHT  ANTECUBITAL  Final   Special Requests BOTTLES DRAWN AEROBIC AND ANAEROBIC 5ML  Final   Culture   Final    NO GROWTH 5 DAYS Performed at [facility]    Report Status 07/22/2015 FINAL  Final  Blood Culture (routine x 2)     Status: None   Collection Time: 07/17/15 11:00 AM  Result Value Ref Range Status   Specimen Description BLOOD LEFT FOREARM  Final   Special Requests BOTTLES DRAWN AEROBIC ONLY 5ML  Final   Culture   Final    NO GROWTH 5 DAYS Performed at [facility]    Report Status 07/22/2015 FINAL  Final  Culture, body fluid-bottle     Status: None   Collection Time: 07/17/15  3:47 PM  Result Value Ref Range Status   Specimen Description FLUID PERITONEAL  Final   Special Requests BOTTLES DRAWN AEROBIC AND ANAEROBIC 10CC  Final   Culture   Final    NO GROWTH 5 DAYS Performed at [facility]    Report Status 07/22/2015 FINAL  Final  Gram stain     Status: None   Collection  Time: 07/17/15  3:47 PM  Result Value Ref Range Status   Specimen Description FLUID PERITONEAL  Final   Special Requests NONE  Final   Gram Stain   Final    CYTOSPIN SMEAR WBC PRESENT,BOTH PMN AND MONONUCLEAR NO ORGANISMS SEEN Gram Stain Report Called to,Read Back By and Verified With: FAITH HASZ RN 1.4.17 @ [id number] BY RICEJ Performed at [facility]    Report Status 07/17/2015 FINAL  Final  Urine culture     Status: None   Collection Time: 07/17/15  8:09 PM  Result Value Ref Range Status   Specimen Description URINE, CLEAN CATCH  Final   Special Requests NONE  Final   Culture   Final    NO GROWTH 1 DAY Performed at [facility]    Report Status 07/19/2015 FINAL  Final  MRSA PCR Screening     Status: None   Collection Time: 07/18/15  3:59 AM  Result Value Ref Range Status   MRSA by PCR NEGATIVE NEGATIVE Final    Comment:        The GeneXpert MRSA Assay (FDA approved for NASAL specimens only), is one component of a comprehensive MRSA  colonization surveillance program. It is not intended to diagnose MRSA infection nor to guide or monitor treatment for MRSA infections.      Studies: Dg Chest Port 1 View  07/24/2015  CLINICAL DATA:  Postop right Port-A-Cath placement, left breast cancer EXAM: PORTABLE CHEST 1 VIEW COMPARISON:  07/17/2015 FINDINGS: Right Port-A-Cath placed with no pneumothorax. Catheter tip extends about 13 mm beyond the cavoatrial junction into the right atrium. Heart size and vascular pattern normal. IMPRESSION: Port-A-Cath as described with no pneumothorax. Electronically Signed   By: Skipper Cliche M.D.   On: 07/24/2015 16:55   Dg C-arm 1-60 Min-no Report  07/24/2015  CLINICAL DATA: port a cath insertion C-ARM 1-60 MINUTES Fluoroscopy was utilized by the requesting physician.  No radiographic interpretation.    Scheduled Meds: . sodium chloride   Intravenous Once  . acidophilus  1 capsule Oral Daily  . albumin human  50 g Intravenous Once  . pantoprazole (PROTONIX) IV  40 mg Intravenous QHS  . senna-docusate  2 tablet Oral Daily  . sodium polystyrene  30 g Oral Once  . vitamin B-12  100 mcg Oral Daily   Continuous Infusions: . lactated ringers 100 mL/hr at 07/25/15 0500    Principal Problem:   Breast cancer of upper-outer quadrant of left female breast ([place]) Active Problems:   Ascites   Peritonitis (HCC)   SBP (spontaneous bacterial peritonitis) (HCC)   Abdominal pain   SOB (shortness of breath)   DDD (degenerative disc disease), lumbar   Severe sepsis ([place])   Sepsis ([place])   Metastasis to liver ([place])    Time spent:25 minutes    Darrie Macmillan  [facility] Pager [phone] If 7PM-7AM, please contact night-coverage at [email or web address], password [facility] 07/25/2015, 1:15 PM  LOS: 8 days

## 2015-07-25 NOTE — Procedures (Signed)
US guided therapeutic paracentesis performed yielding 4.9 liters blood-tinged fluid. No immediate complications.

## 2015-07-25 NOTE — Progress Notes (Signed)
BSA, doses and dilutions of Gemzar an Carboplatin verified with 2nd chemo certified RN Reyne Dumas

## 2015-07-26 ENCOUNTER — Other Ambulatory Visit: Payer: Self-pay

## 2015-07-26 ENCOUNTER — Inpatient Hospital Stay (HOSPITAL_COMMUNITY): Payer: Medicaid Other

## 2015-07-26 DIAGNOSIS — B370 Candidal stomatitis: Secondary | ICD-10-CM

## 2015-07-26 DIAGNOSIS — R070 Pain in throat: Secondary | ICD-10-CM

## 2015-07-26 LAB — COMPREHENSIVE METABOLIC PANEL
ALT: 122 U/L — ABNORMAL HIGH (ref 14–54)
AST: 439 U/L — ABNORMAL HIGH (ref 15–41)
Albumin: 2.5 g/dL — ABNORMAL LOW (ref 3.5–5.0)
Alkaline Phosphatase: 434 U/L — ABNORMAL HIGH (ref 38–126)
Anion gap: 12 (ref 5–15)
BUN: 30 mg/dL — ABNORMAL HIGH (ref 6–20)
CO2: 24 mmol/L (ref 22–32)
Calcium: 8.4 mg/dL — ABNORMAL LOW (ref 8.9–10.3)
Chloride: 93 mmol/L — ABNORMAL LOW (ref 101–111)
Creatinine, Ser: 0.68 mg/dL (ref 0.44–1.00)
GFR calc Af Amer: >60 mL/min (ref >60)
GFR calc non Af Amer: >60 mL/min (ref >60)
Glucose, Bld: 135 mg/dL — ABNORMAL HIGH (ref 65–99)
Potassium: 5 mmol/L (ref 3.5–5.1)
Sodium: 129 mmol/L — ABNORMAL LOW (ref 135–145)
Total Bilirubin: 1.8 mg/dL — ABNORMAL HIGH (ref 0.3–1.2)
Total Protein: 5 g/dL — ABNORMAL LOW (ref 6.5–8.1)

## 2015-07-26 LAB — CBC
HCT: 30.6 % — ABNORMAL LOW (ref 36.0–46.0)
HEMOGLOBIN: 9.9 g/dL — AB (ref 12.0–15.0)
MCH: 27 pg (ref 26.0–34.0)
MCHC: 32.4 g/dL (ref 30.0–36.0)
MCV: 83.4 fL (ref 78.0–100.0)
Platelets: 420 10*3/uL — ABNORMAL HIGH (ref 150–400)
RBC: 3.67 MIL/uL — ABNORMAL LOW (ref 3.87–5.11)
RDW: 14.6 % (ref 11.5–15.5)
WBC: 19.5 10*3/uL — ABNORMAL HIGH (ref 4.0–10.5)

## 2015-07-26 MED ORDER — FLUCONAZOLE 100 MG PO TABS
100.0000 mg | ORAL_TABLET | Freq: Every day | ORAL | Status: DC
  Administered 2015-07-26 – 2015-07-29 (×4): 100 mg via ORAL
  Filled 2015-07-26 (×4): qty 1

## 2015-07-26 MED ORDER — NYSTATIN 100000 UNIT/ML MT SUSP
5.0000 mL | Freq: Four times a day (QID) | OROMUCOSAL | Status: DC
  Administered 2015-07-26 – 2015-07-29 (×12): 500000 [IU] via ORAL
  Filled 2015-07-26 (×12): qty 5

## 2015-07-26 NOTE — Progress Notes (Signed)
[facility] PROGRESS NOTE  Dawn Haynes [id number] DOB: May 10, 1981 DOA: 07/17/2015 PCP: Mora Bellman, MD  Brief narrative 35 y/o female with recently diagnosed left breast cancer. she felt a lump in her left upper outer quadrant for almost 1 year which is slowly increasing in size and becoming painful. A mammogram and ultrasound showed 5.3 cm mass with abnormal left axillary lymph nodes. Biopsy was positive for high-grade triple negative breast cancer. She was presented to the tumor board on 1/4 and then saw her oncologist Dr. [name] the same day. She was found to be tachycardic and tachypneic with abdominal distention (which she informs for the past 1 month). She was then sent to the ED for further management and paracentesis. ED she underwent diagnostic and therapeutic paracentesis with about 6 L bloody fluid removed. It showed about 20,00 white cells. She was septic with tachycardia, tachypnea and low blood pressure with markedly elevated lactic acid (5.68) She was placed on IV vancomycin and Zosyn concern for SBP and sepsis and admitted to stepdown unit.   Assessment/plan  Sepsis   secondary to  SIRS due to high tumor burden with liver metastases and malignant ascites. Initial concern for SBP on presentation. Received aggressive hydration for persistent lactic acid elevation which was due to high tumor burden and liver failure. All cultures have been negative. Antibiotics discontinued (received  total 8 days)   Metastatic left breast cancer Grade 3 invasive ductal carcinoma per pathology, Triple negative. CT abdomen and pelvis done on 1/4 showing enlarged liver largely replaced by extensive confluent metastatic disease with large volume ascites mainly in the pelvic area. Also has mass effect on the right kidney by and large right liver lobe. - Dr. [name] following.  Port A cath placed by surgery on 1/11 and artery on chemotherapy on 1/12. Overall prognosis is poor. Dr [name] discussed  options for palliative care with the patient but she is very hopeful to improve at this time. He will reinforce this discussion again as outpatient.   Metastatic liver disease with ascites Hepatitis panel is negative. CT abdomen shows  enlarged liver which is  largely replaced by extensive confluent metastatic disease. s/p paracentesis with 6L removal on 1/4 and 5.7 L removal on 1/7. Has frequent reaccumulation. Underwent large-volume paracentesis again on 1/12 with open 9 L blood-tinged fluid removed. Oncologist recommends percutaneous peritoneal drain placement to provide self drain for the patient at home. -I called interventional radiology and they are booked for outpatient procedure until 1/18 and until then she will have significant reaccumulation. I would keep her over the weekend and have a percutaneous drain placed in on Monday and discharge her home.   Hyponatremia  Likely hypovolemic. Also received Lasix while in the hospital. Discontinued fluid and Lasix. Improved this morning.  Hyperkalemia Stable on telemetry. Improved today.   Protein calorie malnutrition  Dietitian consulted  Sinus tachycardia  likely in the setting of extensive metastatic disease. Not in pain at this time. TSH normal.  Oral thrush Added Diflucan and nystatin.  Prognosis is guarded due to advanced, extensive liver mets with breast cancer at risk for multiorgan dysfunction.  Oncologist recommends palliative care evaluation. Patient is very hopeful at this time. He will carry on the discussion as outpatient.  Code Status: Full code Family Communication: Husband at bedside Disposition Plan: Home early next week after percutaneous peritoneal drain placed.   Consultants:  Dr. Carolyn Stare surgery ( for [facility] A cath placement)  Procedures:  CT abdomen and pelvis  Large-volume  paracenteses 1/4, 1/7, 1/12  Antibiotics:  IV Zosyn since 1/4-1/11    HPI/Subjective: Seen and examined.  Abdominal pain better after paracentesis yesterday. Complains of some difficulty swallowing  Objective: Filed Vitals:   07/26/15 0542 07/26/15 1245  BP: 116/71 107/70  Pulse: 115 116  Temp: 97.8 F (36.6 C) 97.9 F (36.6 C)  Resp: 18 18    Intake/Output Summary (Last 24 hours) at 07/26/15 1507 Last data filed at 07/26/15 0700  Gross per 24 hour  Intake 1663.33 ml  Output      0 ml  Net 1663.33 ml   Filed Weights   07/18/15 0357 07/20/15 0424 07/26/15 1245  Weight: 73.4 kg (161 lb 13.1 oz) 82.5 kg (181 lb 14.1 oz) 78.9 kg (173 lb 15.1 oz)    Exam:   General:   Fatigued,  HEENT: Oral thrush  Cardiovascular: S1 and S2 tachycardic, no murmurs  Respiratory: Clear bilaterally, no added sounds, right-sided Port-A-Cath.  Abdomen: Distended with ascites, nontender,,  Musculoskeletal: Warm, trace edema bilaterally  CNS: Alert and oriented  Data Reviewed: Basic Metabolic Panel:  Recent Labs Lab 07/22/15 0403 07/23/15 0350 07/24/15 0515 07/25/15 0807 07/26/15 0400  NA 128* 125* 126* 127* 129*  K 4.4 5.1 5.0 5.5* 5.0  CL 93* 90* 89* 89* 93*  CO2 24 24 26 28 24   GLUCOSE 121* 94 113* 120* 135*  BUN 19 21* 25* 26* 30*  CREATININE 0.65 0.71 0.79 0.69 0.68  CALCIUM 8.9 8.8* 8.9 8.8* 8.4*   Liver Function Tests:  Recent Labs Lab 07/21/15 0500 07/23/15 0350 07/24/15 0515 07/25/15 0807 07/26/15 0400  AST 536* 736* 714* 629* 439*  ALT 164* 233* 227* 184* 122*  ALKPHOS 359* 498* 548* 514* 434*  BILITOT 2.2* 1.8* 1.2 1.5* 1.8*  PROT 5.3* 5.2* 5.5* 5.5* 5.0*  ALBUMIN 3.0* 2.3* 2.3* 2.2* 2.5*   No results for input(s): LIPASE, AMYLASE in the last 168 hours. No results for input(s): AMMONIA in the last 168 hours. CBC:  Recent Labs Lab 07/21/15 0500 07/23/15 0350 07/24/15 0515 07/25/15 0807 07/26/15 0400  WBC 13.8* 15.6* 16.6* 18.8* 19.5*  HGB 11.1* 12.1 11.5* 11.1* 9.9*  HCT 34.2* 37.6 35.6* 34.5* 30.6*  MCV 84.4 84.1 82.8 83.5 83.4  PLT 444* 454* 455*  473* 420*   Cardiac Enzymes: No results for input(s): CKTOTAL, CKMB, CKMBINDEX, TROPONINI in the last 168 hours. BNP (last 3 results) No results for input(s): BNP in the last 8760 hours.  ProBNP (last 3 results) No results for input(s): PROBNP in the last 8760 hours.  CBG: No results for input(s): GLUCAP in the last 168 hours.  Recent Results (from the past 240 hour(s))  Blood Culture (routine x 2)     Status: None   Collection Time: 07/17/15 10:40 AM  Result Value Ref Range Status   Specimen Description BLOOD RIGHT ANTECUBITAL  Final   Special Requests BOTTLES DRAWN AEROBIC AND ANAEROBIC 5ML  Final   Culture   Final    NO GROWTH 5 DAYS Performed at [facility]    Report Status 07/22/2015 FINAL  Final  Blood Culture (routine x 2)     Status: None   Collection Time: 07/17/15 11:00 AM  Result Value Ref Range Status   Specimen Description BLOOD LEFT FOREARM  Final   Special Requests BOTTLES DRAWN AEROBIC ONLY 5ML  Final   Culture   Final    NO GROWTH 5 DAYS Performed at [facility]    Report Status 07/22/2015  FINAL  Final  Culture, body fluid-bottle     Status: None   Collection Time: 07/17/15  3:47 PM  Result Value Ref Range Status   Specimen Description FLUID PERITONEAL  Final   Special Requests BOTTLES DRAWN AEROBIC AND ANAEROBIC 10CC  Final   Culture   Final    NO GROWTH 5 DAYS Performed at [facility]    Report Status 07/22/2015 FINAL  Final  Gram stain     Status: None   Collection Time: 07/17/15  3:47 PM  Result Value Ref Range Status   Specimen Description FLUID PERITONEAL  Final   Special Requests NONE  Final   Gram Stain   Final    CYTOSPIN SMEAR WBC PRESENT,BOTH PMN AND MONONUCLEAR NO ORGANISMS SEEN Gram Stain Report Called to,Read Back By and Verified With: FAITH HASZ RN 1.4.17 @ [id number] BY RICEJ Performed at [facility]    Report Status 07/17/2015 FINAL  Final  Urine culture     Status: None   Collection Time: 07/17/15   8:09 PM  Result Value Ref Range Status   Specimen Description URINE, CLEAN CATCH  Final   Special Requests NONE  Final   Culture   Final    NO GROWTH 1 DAY Performed at [facility]    Report Status 07/19/2015 FINAL  Final  MRSA PCR Screening     Status: None   Collection Time: 07/18/15  3:59 AM  Result Value Ref Range Status   MRSA by PCR NEGATIVE NEGATIVE Final    Comment:        The GeneXpert MRSA Assay (FDA approved for NASAL specimens only), is one component of a comprehensive MRSA colonization surveillance program. It is not intended to diagnose MRSA infection nor to guide or monitor treatment for MRSA infections.      Studies: US Paracentesis  07/25/2015  INDICATION: Metastatic breast cancer, recurrent ascites. Request made for therapeutic paracentesis. EXAM: ULTRASOUND-GUIDED THERAPEUTIC PARACENTESIS COMPARISON:  Prior paracentesis on 07/20/15 MEDICATIONS: None. COMPLICATIONS: None immediate TECHNIQUE: Informed written consent was obtained from the patient after a discussion of the risks, benefits and alternatives to treatment. A timeout was performed prior to the initiation of the procedure. Initial ultrasound scanning demonstrates a large amount of ascites within the left lower abdominal quadrant. The left lower abdomen was prepped and draped in the usual sterile fashion. 1% lidocaine was used for local anesthesia. Under direct ultrasound guidance, a 19 gauge, 7-cm, Yueh catheter was introduced. An ultrasound image was saved for documentation purposed. The paracentesis was performed. The catheter was removed and a dressing was applied. The patient tolerated the procedure well without immediate post procedural complication. FINDINGS: A total of approximately 4.9 liters of blood-tinged fluid was removed. IMPRESSION: Successful ultrasound-guided therapeutic paracentesis yielding 4.9 liters of peritoneal fluid. Read by: Rowe Robert, PA-C Electronically Signed   By: Jacqulynn Cadet M.D.   On: 07/25/2015 17:24   Dg Chest Port 1 View  07/26/2015  CLINICAL DATA:  Leukocytosis EXAM: PORTABLE CHEST 1 VIEW COMPARISON:  Two days ago FINDINGS: Right subclavian porta catheter with tip at the SVC level. Low lung volumes without edema, consolidation, effusion, or pneumothorax. Normal cardiomediastinal silhouette. IMPRESSION: Negative chest. Electronically Signed   By: Monte Fantasia M.D.   On: 07/26/2015 10:51   Dg Chest Port 1 View  07/24/2015  CLINICAL DATA:  Postop right Port-A-Cath placement, left breast cancer EXAM: PORTABLE CHEST 1 VIEW COMPARISON:  07/17/2015 FINDINGS: Right Port-A-Cath placed with no pneumothorax.  Catheter tip extends about 13 mm beyond the cavoatrial junction into the right atrium. Heart size and vascular pattern normal. IMPRESSION: Port-A-Cath as described with no pneumothorax. Electronically Signed   By: Skipper Cliche M.D.   On: 07/24/2015 16:55   Dg C-arm 1-60 Min-no Report  07/24/2015  CLINICAL DATA: port a cath insertion C-ARM 1-60 MINUTES Fluoroscopy was utilized by the requesting physician.  No radiographic interpretation.    Scheduled Meds: . acidophilus  1 capsule Oral Daily  . fluconazole  100 mg Oral Daily  . pantoprazole (PROTONIX) IV  40 mg Intravenous QHS  . senna-docusate  2 tablet Oral Daily  . sodium chloride  10-40 mL Intracatheter Q12H  . vitamin B-12  100 mcg Oral Daily   Continuous Infusions: . lactated ringers 100 mL/hr at 07/26/15 0225    Principal Problem:   Breast cancer of upper-outer quadrant of left female breast (HCC) Active Problems:   Ascites   Peritonitis (HCC)   SBP (spontaneous bacterial peritonitis) (HCC)   Abdominal pain   SOB (shortness of breath)   DDD (degenerative disc disease), lumbar   Severe sepsis ([place])   Sepsis ([place])   Metastasis to liver ([place])    Time spent:25 minutes    Elyana Grabski  [facility] Pager [phone] If 7PM-7AM, please contact night-coverage at  [email or web address], password [facility] 07/26/2015, 3:07 PM  LOS: 9 days

## 2015-07-26 NOTE — Care Management Note (Signed)
Case Management Note  Patient Details  Name: Dawn Haynes MRN: [id number] Date of Birth: 12-Oct-1980  Subjective/Objective: POD #1 paracentesis-drain placement.HHRN needed for mgmnt. [facility] chosen-[facility] rep aware of referral, await [place] social worker order-patient's current medicaid only covers children. [facility] will asst with getting straight medicaid for patient.                   Action/Plan:d/c plan home w/[facility]HC.   Expected Discharge Date:   (unknown)               Expected Discharge Plan:  [place]  In-House Referral:  NA  Discharge planning Services  CM Consult  Post Acute Care Choice:    Choice offered to:  Patient  DME Arranged:    DME Agency:     HH Arranged:    [place] Agency:  [place]  Status of Service:  In process, will continue to follow  Medicare Important Message Given:    Date Medicare IM Given:    Medicare IM give by:    Date Additional Medicare IM Given:    Additional Medicare Important Message give by:     If discussed at [place] of Stay Meetings, dates discussed:    Additional Comments:  Dessa Phi, RN 07/26/2015, 12:08 PM

## 2015-07-26 NOTE — Progress Notes (Signed)
Patient ID: Dawn Haynes, female   DOB: June 17, 1981, 35 y.o.   MRN: [id number] Request received for placement of a tunneled peritoneal drainage catheter in pt  for recurrent symptomatic ascites. Case reviewed with Dr. Kathlene Cote. Due to the fact the patient had large volume paracentesis yesterday catheter placement will need to be performed next week to allow for adequate reaccumulation and to buffer patient from potential bowel injury during placement. This procedure can be performed either as an inpatient or outpatient. Please contact our service at [phone] with any additional questions.

## 2015-07-26 NOTE — Progress Notes (Signed)
HEMATOLOGY-ONCOLOGY PROGRESS NOTE  SUBJECTIVE:complaining of sore throat and thrush  OBJECTIVE: PHYSICAL EXAMINATION: ECOG PERFORMANCE STATUS: 3 - Symptomatic, >50% confined to bed  Filed Vitals:   07/26/15 0542 07/26/15 1245  BP: 116/71 107/70  Pulse: 115 116  Temp: 97.8 F (36.6 C) 97.9 F (36.6 C)  Resp: 18 18   Filed Weights   07/18/15 0357 07/20/15 0424 07/26/15 1245  Weight: 161 lb 13.1 oz (73.4 kg) 181 lb 14.1 oz (82.5 kg) 173 lb 15.1 oz (78.9 kg)    GENERAL:alert, no distress and comfortable SKIN: skin color, texture, turgor are normal, no rashes or significant lesions EYES: normal, Conjunctiva are pink and non-injected, sclera clear OROPHARYNX:thrush NECK: supple, thyroid normal size, non-tender, without nodularity LUNGS: clear to auscultation and percussion with normal breathing effort HEART: regular rate & rhythm and no murmurs and no lower extremity edema ABDOMEN:moderately distended with ascites Musculoskeletal:no cyanosis of digits and no clubbing  NEURO: alert & oriented x 3 with fluent speech, no focal motor/sensory deficits  LABORATORY DATA:  I have reviewed the data as listed CMP Latest Ref Rng 07/26/2015 07/25/2015 07/24/2015  Glucose 65 - 99 mg/dL 135(H) 120(H) 113(H)  BUN 6 - 20 mg/dL 30(H) 26(H) 25(H)  Creatinine 0.44 - 1.00 mg/dL 0.68 0.69 0.79  Sodium 135 - 145 mmol/L 129(L) 127(L) 126(L)  Potassium 3.5 - 5.1 mmol/L 5.0 5.5(H) 5.0  Chloride 101 - 111 mmol/L 93(L) 89(L) 89(L)  CO2 22 - 32 mmol/L 24 28 26   Calcium 8.9 - 10.3 mg/dL 8.4(L) 8.8(L) 8.9  Total Protein 6.5 - 8.1 g/dL 5.0(L) 5.5(L) 5.5(L)  Total Bilirubin 0.3 - 1.2 mg/dL 1.8(H) 1.5(H) 1.2  Alkaline Phos 38 - 126 U/L 434(H) 514(H) 548(H)  AST 15 - 41 U/L 439(H) 629(H) 714(H)  ALT 14 - 54 U/L 122(H) 184(H) 227(H)    Lab Results  Component Value Date   WBC 19.5* 07/26/2015   HGB 9.9* 07/26/2015   HCT 30.6* 07/26/2015   MCV 83.4 07/26/2015   PLT 420* 07/26/2015   NEUTROABS 5.6  07/17/2015    ASSESSMENT AND PLAN: 1. Metastatic triple negative breast cancer with liver metastases with recurrent ascites: Patient received first cycle of carboplatin and gemcitabine yesterday and appears to tolerated it fairly well. Her bilirubin has slightly increased but AST and ALT is slightly decreased from before. Alkaline phosphatase was also slightly improved from before. 2. I recommended that she should get a peritoneal drainage catheter so that she can be discharged home with home health follow-up.  I would like to see her back next Thursday to receive cycle 1 day 8 of carboplatin and gemcitabine. I will request an outpatient palliative care consultation.

## 2015-07-26 NOTE — Progress Notes (Signed)
Palliative medicine consult received- I met patient today and explained the role of PC- her pain is being well controlled and there are plans to place a peritoneal drain. Multiple visitors in and out during my visit and made it difficult for her to focus-we agreed to meet on Monday for more extensive conversation. She has not given thought to advance care planning -she has 6 children ages 64-18. Complex psycho-social issues with family and patient-will need to work through these with her. New diagnosis but very advanced disease- she is really in the beginning stages of understanding her condition. Our team will follow.   Lane Hacker, DO Palliative Medicine [phone]

## 2015-07-26 NOTE — Progress Notes (Signed)
Report called to Dawn Ano RN. Patient will be transferred to room 1324. Pt aware.

## 2015-07-27 ENCOUNTER — Encounter (HOSPITAL_COMMUNITY): Payer: Self-pay | Admitting: Radiology

## 2015-07-27 LAB — CBC
HEMATOCRIT: 33.6 % — AB (ref 36.0–46.0)
Hemoglobin: 10.7 g/dL — ABNORMAL LOW (ref 12.0–15.0)
MCH: 26.9 pg (ref 26.0–34.0)
MCHC: 31.8 g/dL (ref 30.0–36.0)
MCV: 84.4 fL (ref 78.0–100.0)
Platelets: 325 10*3/uL (ref 150–400)
RBC: 3.98 MIL/uL (ref 3.87–5.11)
RDW: 14.6 % (ref 11.5–15.5)
WBC: 16.8 10*3/uL — ABNORMAL HIGH (ref 4.0–10.5)

## 2015-07-27 LAB — COMPREHENSIVE METABOLIC PANEL
ALK PHOS: 447 U/L — AB (ref 38–126)
ALT: 118 U/L — ABNORMAL HIGH (ref 14–54)
ANION GAP: 11 (ref 5–15)
AST: 420 U/L — ABNORMAL HIGH (ref 15–41)
Albumin: 2.3 g/dL — ABNORMAL LOW (ref 3.5–5.0)
BILIRUBIN TOTAL: 1.6 mg/dL — AB (ref 0.3–1.2)
BUN: 24 mg/dL — AB (ref 6–20)
CALCIUM: 8.3 mg/dL — AB (ref 8.9–10.3)
CO2: 26 mmol/L (ref 22–32)
Chloride: 92 mmol/L — ABNORMAL LOW (ref 101–111)
Creatinine, Ser: 0.59 mg/dL (ref 0.44–1.00)
GFR calc Af Amer: >60 mL/min (ref >60)
Glucose, Bld: 131 mg/dL — ABNORMAL HIGH (ref 65–99)
POTASSIUM: 4.5 mmol/L (ref 3.5–5.1)
Sodium: 129 mmol/L — ABNORMAL LOW (ref 135–145)
TOTAL PROTEIN: 5 g/dL — AB (ref 6.5–8.1)

## 2015-07-27 NOTE — Progress Notes (Signed)
Chief Complaint: Patient was seen in consultation today for recurrent malignant ascites at the request of Dr. [name]  Referring Physician(s): Dr. [name]  History of Present Illness: Dawn Haynes is a 35 y.o. female with metastatic breast cancer and recurrent malignant ascites. She has required multiple large volume paracentesis in the past 2 weeks. She has extensive disease with poor prognosis and is in discussion with Palliative services. IR is requested to place tunneled peritoneal drain. Chart, PMHx, meds, labs, imaging reviewed. Last para on 1/12 with 4.9 L removed. Pt with ongoing leukocytosis felt secondary to sepsis/SIRS due to high tumor burden. Has been afebrile and all cultures have been negative, including peritoneal fluid. Pt sitting up in bed this am, no new c/o  Past Medical History  Diagnosis Date  . Spina bifida ([place])   . Chlamydia   . Blood transfusion without reported diagnosis 2009    after postpartum hemorrhage  . Postpartum hemorrhage 2009    28 week fetal demise  . DDD (degenerative disc disease), lumbar   . Breast cancer of upper-outer quadrant of left female breast ([place]) 07/11/2015  . Breast cancer [facility])     Past Surgical History  Procedure Laterality Date  . Wisdom tooth extraction    . Induced abortion    . Dilation and curettage of uterus    . Cesarean section N/A 01/22/2013    Procedure: primary CESAREAN SECTION of baby boy at 2001;  Surgeon: Cheri Fowler, MD;  Location: [facility];  Service: Obstetrics;  Laterality: N/A;  . Cholecystectomy  Aug 2000  . Portacath placement Right 07/24/2015    Procedure: INSERTION PORT-A-CATH RIGHT SUBCLAVIAN;  Surgeon: [name], MD;  Location: [facility];  Service: General;  Laterality: Right;    Allergies: Review of patient's allergies indicates no known allergies.  Medications:  Current facility-administered medications:  .  acidophilus (RISAQUAD) capsule 1 capsule, 1 capsule, Oral, Daily, Theressa Millard, MD, 1 capsule at 07/26/15 0756 .  alteplase (CATHFLO ACTIVASE) injection 2 mg, 2 mg, Intracatheter, Once PRN, Nicholas Lose, MD .  alum & mag hydroxide-simeth (MAALOX/MYLANTA) 200-200-20 MG/5ML suspension 30 mL, 30 mL, Oral, Q6H PRN, Theressa Millard, MD .  diphenhydrAMINE (BENADRYL) capsule 25 mg, 25 mg, Oral, Q6H PRN, Gardiner Barefoot, NP, 25 mg at 07/20/15 0121 .  fluconazole (DIFLUCAN) tablet 100 mg, 100 mg, Oral, Daily, Nishant Dhungel, MD, 100 mg at 07/26/15 1508 .  heparin lock flush 100 unit/mL, 500 Units, Intracatheter, Once PRN, Nicholas Lose, MD .  heparin lock flush 100 unit/mL, 250 Units, Intracatheter, Once PRN, Nicholas Lose, MD .  HYDROmorphone (DILAUDID) injection 1 mg, 1 mg, Intravenous, Q2H PRN, [name], MD, 1 mg at 07/27/15 0611 .  iohexol (OMNIPAQUE) 300 MG/ML solution 25 mL, 25 mL, Oral, Once PRN, Orlie Dakin, MD .  lactated ringers infusion, , Intravenous, Continuous, [name], MD, Last Rate: 100 mL/hr at 07/27/15 0616 .  nystatin (MYCOSTATIN) 100000 UNIT/ML suspension 500,000 Units, 5 mL, Oral, QID, Nishant Dhungel, MD, 500,000 Units at 07/26/15 2154 .  ondansetron (ZOFRAN-ODT) disintegrating tablet 4 mg, 4 mg, Oral, Q6H PRN, 4 mg at 07/27/15 0323 **OR** ondansetron (ZOFRAN) injection 4 mg, 4 mg, Intravenous, Q6H PRN, [name], MD .  oxyCODONE (Oxy IR/ROXICODONE) immediate release tablet 5 mg, 5 mg, Oral, Q4H PRN, Theressa Millard, MD, 5 mg at 07/26/15 2203 .  pantoprazole (PROTONIX) injection 40 mg, 40 mg, Intravenous, QHS, [name], MD, 40 mg at 07/26/15 2154 .  senna-docusate (Senokot-S)  tablet 2 tablet, 2 tablet, Oral, Daily, Caren Griffins, MD, 2 tablet at 07/26/15 0756 .  sodium chloride 0.9 % injection 10 mL, 10 mL, Intracatheter, PRN, Nicholas Lose, MD .  sodium chloride 0.9 % injection 10-40 mL, 10-40 mL, Intracatheter, Q12H, Nishant Dhungel, MD .  sodium chloride 0.9 % injection 10-40 mL, 10-40 mL, Intracatheter, PRN,  Nishant Dhungel, MD, 10 mL at 07/26/15 0359 .  sodium chloride 0.9 % injection 3 mL, 3 mL, Intravenous, PRN, Nicholas Lose, MD .  vitamin B-12 (CYANOCOBALAMIN) tablet 100 mcg, 100 mcg, Oral, Daily, Theressa Millard, MD, 100 mcg at 07/26/15 0756    Family History  Problem Relation Age of Onset  . Diabetes Maternal Grandmother   . Lung cancer Paternal Grandfather     Social History   Social History  . Marital Status: Married    Spouse Name: N/A  . Number of Children: N/A  . Years of Education: N/A   Social History Main Topics  . Smoking status: Never Smoker   . Smokeless tobacco: Never Used  . Alcohol Use: No  . Drug Use: No  . Sexual Activity: Not Currently   Other Topics Concern  . None   Social History Narrative     Review of Systems: A 12 point ROS discussed and pertinent positives are indicated in the HPI above.  All other systems are negative.  Review of Systems  Vital Signs: BP 112/75 mmHg  Pulse 120  Temp(Src) 98.6 F (37 C) (Oral)  Resp 24  Ht 5\' 2"  (1.575 m)  Wt 173 lb 15.1 oz (78.9 kg)  BMI 31.81 kg/m2  SpO2 100%  LMP 07/26/2015  Physical Exam  Constitutional: She is oriented to person, place, and time. She appears well-developed. No distress.  HENT:  Head: Normocephalic.  Mouth/Throat: Oropharynx is clear and moist.  Neck: Normal range of motion. No tracheal deviation present.  Cardiovascular: Normal rate, regular rhythm and normal heart sounds.   Pulmonary/Chest: Effort normal and breath sounds normal. No respiratory distress.  Abdominal: Soft. She exhibits distension. There is no tenderness.  Neurological: She is alert and oriented to person, place, and time.  Skin: Skin is warm and dry.  Psychiatric: She has a normal mood and affect. Judgment normal.    Mallampati Score:  MD Evaluation Airway: WNL Heart: WNL Abdomen: WNL Chest/ Lungs: WNL ASA  Classification: 2 Mallampati/Airway Score: Two   Labs:  CBC:  Recent Labs   07/23/15 0350 07/24/15 0515 07/25/15 0807 07/26/15 0400  WBC 15.6* 16.6* 18.8* 19.5*  HGB 12.1 11.5* 11.1* 9.9*  HCT 37.6 35.6* 34.5* 30.6*  PLT 454* 455* 473* 420*    COAGS:  Recent Labs  07/17/15 1040 07/23/15 0350  INR 1.04 1.24  APTT  --  31    BMP:  Recent Labs  07/23/15 0350 07/24/15 0515 07/25/15 0807 07/26/15 0400  NA 125* 126* 127* 129*  K 5.1 5.0 5.5* 5.0  CL 90* 89* 89* 93*  CO2 24 26 28 24   GLUCOSE 94 113* 120* 135*  BUN 21* 25* 26* 30*  CALCIUM 8.8* 8.9 8.8* 8.4*  CREATININE 0.71 0.79 0.69 0.68  GFRNONAA >60 >60 >60 >60  GFRAA >60 >60 >60 >60    LIVER FUNCTION TESTS:  Recent Labs  07/23/15 0350 07/24/15 0515 07/25/15 0807 07/26/15 0400  BILITOT 1.8* 1.2 1.5* 1.8*  AST 736* 714* 629* 439*  ALT 233* 227* 184* 122*  ALKPHOS 498* 548* 514* 434*  PROT 5.2* 5.5* 5.5* 5.0*  ALBUMIN 2.3* 2.3* 2.2* 2.5*    TUMOR MARKERS: No results for input(s): AFPTM, CEA, CA199, CHROMGRNA in the last 8760 hours.  Assessment and Plan: Metastatic breast cancer with recurrent malignant ascites Plan for tunneled peritoneal drainage catheter on Mon Labs reviewed. Elevated WBC from ongoing SIRS but all cultures negative and afebrile. Discussed procedure, risks, complications, use of sedation. Consent signed in chart  Electronically Signed: Ascencion Dike 07/27/2015, 9:31 AM   I spent a total of 20 minutes in face to face in clinical consultation, greater than 50% of which was counseling/coordinating care for placement of tunneled peritoneal drain

## 2015-07-27 NOTE — Progress Notes (Signed)
[facility] PROGRESS NOTE  Dawn Haynes [id number] DOB: March 22, 1981 DOA: 07/17/2015 PCP: Mora Bellman, MD  Brief narrative 35 y/o female with recently diagnosed left breast cancer. she felt a lump in her left upper outer quadrant for almost 1 year which is slowly increasing in size and becoming painful. A mammogram and ultrasound showed 5.3 cm mass with abnormal left axillary lymph nodes. Biopsy was positive for high-grade triple negative breast cancer. She was presented to the tumor board on 1/4 and then saw her oncologist Dr. [name] the same day. She was found to be tachycardic and tachypneic with abdominal distention (which she informs for the past 1 month). She was then sent to the ED for further management and paracentesis. ED she underwent diagnostic and therapeutic paracentesis with about 6 L bloody fluid removed. It showed about 20,00 white cells. She was septic with tachycardia, tachypnea and low blood pressure with markedly elevated lactic acid (5.68) She was placed on IV vancomycin and Zosyn concern for SBP and sepsis and admitted to stepdown unit.   Assessment/plan  Sepsis   secondary to  SIRS due to high tumor burden with liver metastases and malignant ascites. Initial concern for SBP on presentation. Received aggressive hydration for persistent lactic acid elevation which was due to high tumor burden and liver failure. All cultures have been negative. Antibiotics discontinued (received  total 8 days)   Metastatic left breast cancer Grade 3 invasive ductal carcinoma per pathology, Triple negative. CT abdomen and pelvis done on 1/4 showing enlarged liver largely replaced by extensive confluent metastatic disease with large volume ascites mainly in the pelvic area. Also has mass effect on the right kidney by and large right liver lobe. - Dr. [name] following.  Port A cath placed by surgery on 1/11 and artery on chemotherapy on 1/12. Overall prognosis is poor. Dr [name] discussed  options for palliative care with the patient but she is very hopeful to improve at this time.  -Palliative care has been consulted and will be following while in the hospital.  Metastatic liver disease with ascites Hepatitis panel is negative. CT abdomen shows  enlarged liver which is  largely replaced by extensive confluent metastatic disease. s/p paracentesis with 6L removal on 1/4 and 5.7 L removal on 1/7. Has frequent reaccumulation. Underwent large-volume paracentesis again on 1/12 with 4.9 L blood-tinged fluid removed. Oncologist recommends percutaneous peritoneal drain placement to provide self drain for the patient at home. -Scheduled to be placed on 1/16 by IR.   Hyponatremia  Likely hypovolemic. Also received Lasix while in the hospital. Discontinued fluid and Lasix. Improved this morning.  Hyperkalemia Stable on telemetry. Improved    Protein calorie malnutrition  Dietitian consulted  Sinus tachycardia  likely in the setting of extensive metastatic disease. Not in pain at this time. TSH normal.  Oral thrush Added Diflucan and nystatin.  Prognosis is guarded due to advanced extensive liver mets with breast cancer at risk for multiorgan dysfunction.  Palliative care consulted.   Code Status: Full code Family Communication: none at bedside Disposition Plan: Home on Monday after percutaneous drain placed in   Consultants:  Dr. Carolyn Stare surgery ( for [facility] A cath placement)  Procedures:  CT abdomen and pelvis  Large-volume paracenteses 1/4, 1/7, 1/12  Antibiotics:  IV Zosyn since 1/4-1/11    HPI/Subjective: Seen and examined. Ascites slowly building up. Swallowing improved after adding fluconazole.  Objective: Filed Vitals:   07/27/15 0257 07/27/15 0456  BP: 110/75 112/75  Pulse: 125 120  Temp: 97.8 F (36.6 C) 98.6 F (37 C)  Resp: 24     Intake/Output Summary (Last 24 hours) at 07/27/15 1123 Last data filed at 07/27/15 0600  Gross per  24 hour  Intake   2300 ml  Output      0 ml  Net   2300 ml   Filed Weights   07/18/15 0357 07/20/15 0424 07/26/15 1245  Weight: 73.4 kg (161 lb 13.1 oz) 82.5 kg (181 lb 14.1 oz) 78.9 kg (173 lb 15.1 oz)    Exam:   General:   Fatigued,  HEENT: Oral thrush  Cardiovascular: S1 and S2 tachycardic, no murmurs  Respiratory: Clear bilaterally, no added sounds, right-sided Port-A-Cath.  Abdomen: Distended with ascites, nontender,,  Musculoskeletal: Warm, trace edema bilaterally  CNS: Alert and oriented  Data Reviewed: Basic Metabolic Panel:  Recent Labs Lab 07/23/15 0350 07/24/15 0515 07/25/15 0807 07/26/15 0400 07/27/15 1010  NA 125* 126* 127* 129* 129*  K 5.1 5.0 5.5* 5.0 4.5  CL 90* 89* 89* 93* 92*  CO2 24 26 28 24 26   GLUCOSE 94 113* 120* 135* 131*  BUN 21* 25* 26* 30* 24*  CREATININE 0.71 0.79 0.69 0.68 0.59  CALCIUM 8.8* 8.9 8.8* 8.4* 8.3*   Liver Function Tests:  Recent Labs Lab 07/23/15 0350 07/24/15 0515 07/25/15 0807 07/26/15 0400 07/27/15 1010  AST 736* 714* 629* 439* 420*  ALT 233* 227* 184* 122* 118*  ALKPHOS 498* 548* 514* 434* 447*  BILITOT 1.8* 1.2 1.5* 1.8* 1.6*  PROT 5.2* 5.5* 5.5* 5.0* 5.0*  ALBUMIN 2.3* 2.3* 2.2* 2.5* 2.3*   No results for input(s): LIPASE, AMYLASE in the last 168 hours. No results for input(s): AMMONIA in the last 168 hours. CBC:  Recent Labs Lab 07/23/15 0350 07/24/15 0515 07/25/15 0807 07/26/15 0400 07/27/15 1010  WBC 15.6* 16.6* 18.8* 19.5* 16.8*  HGB 12.1 11.5* 11.1* 9.9* 10.7*  HCT 37.6 35.6* 34.5* 30.6* 33.6*  MCV 84.1 82.8 83.5 83.4 84.4  PLT 454* 455* 473* 420* 325   Cardiac Enzymes: No results for input(s): CKTOTAL, CKMB, CKMBINDEX, TROPONINI in the last 168 hours. BNP (last 3 results) No results for input(s): BNP in the last 8760 hours.  ProBNP (last 3 results) No results for input(s): PROBNP in the last 8760 hours.  CBG: No results for input(s): GLUCAP in the last 168 hours.  Recent  Results (from the past 240 hour(s))  Culture, body fluid-bottle     Status: None   Collection Time: 07/17/15  3:47 PM  Result Value Ref Range Status   Specimen Description FLUID PERITONEAL  Final   Special Requests BOTTLES DRAWN AEROBIC AND ANAEROBIC 10CC  Final   Culture   Final    NO GROWTH 5 DAYS Performed at [facility]    Report Status 07/22/2015 FINAL  Final  Gram stain     Status: None   Collection Time: 07/17/15  3:47 PM  Result Value Ref Range Status   Specimen Description FLUID PERITONEAL  Final   Special Requests NONE  Final   Gram Stain   Final    CYTOSPIN SMEAR WBC PRESENT,BOTH PMN AND MONONUCLEAR NO ORGANISMS SEEN Gram Stain Report Called to,Read Back By and Verified With: Orbie Pyo RN 1.4.17 @ [id number] BY RICEJ Performed at [facility]    Report Status 07/17/2015 FINAL  Final  Urine culture     Status: None   Collection Time: 07/17/15  8:09 PM  Result Value Ref Range Status  Specimen Description URINE, CLEAN CATCH  Final   Special Requests NONE  Final   Culture   Final    NO GROWTH 1 DAY Performed at [facility]    Report Status 07/19/2015 FINAL  Final  MRSA PCR Screening     Status: None   Collection Time: 07/18/15  3:59 AM  Result Value Ref Range Status   MRSA by PCR NEGATIVE NEGATIVE Final    Comment:        The GeneXpert MRSA Assay (FDA approved for NASAL specimens only), is one component of a comprehensive MRSA colonization surveillance program. It is not intended to diagnose MRSA infection nor to guide or monitor treatment for MRSA infections.      Studies: US Paracentesis  07/25/2015  INDICATION: Metastatic breast cancer, recurrent ascites. Request made for therapeutic paracentesis. EXAM: ULTRASOUND-GUIDED THERAPEUTIC PARACENTESIS COMPARISON:  Prior paracentesis on 07/20/15 MEDICATIONS: None. COMPLICATIONS: None immediate TECHNIQUE: Informed written consent was obtained from the patient after a discussion of the risks,  benefits and alternatives to treatment. A timeout was performed prior to the initiation of the procedure. Initial ultrasound scanning demonstrates a large amount of ascites within the left lower abdominal quadrant. The left lower abdomen was prepped and draped in the usual sterile fashion. 1% lidocaine was used for local anesthesia. Under direct ultrasound guidance, a 19 gauge, 7-cm, Yueh catheter was introduced. An ultrasound image was saved for documentation purposed. The paracentesis was performed. The catheter was removed and a dressing was applied. The patient tolerated the procedure well without immediate post procedural complication. FINDINGS: A total of approximately 4.9 liters of blood-tinged fluid was removed. IMPRESSION: Successful ultrasound-guided therapeutic paracentesis yielding 4.9 liters of peritoneal fluid. Read by: Rowe Robert, PA-C Electronically Signed   By: Jacqulynn Cadet M.D.   On: 07/25/2015 17:24   Dg Chest Port 1 View  07/26/2015  CLINICAL DATA:  Leukocytosis EXAM: PORTABLE CHEST 1 VIEW COMPARISON:  Two days ago FINDINGS: Right subclavian porta catheter with tip at the SVC level. Low lung volumes without edema, consolidation, effusion, or pneumothorax. Normal cardiomediastinal silhouette. IMPRESSION: Negative chest. Electronically Signed   By: Monte Fantasia M.D.   On: 07/26/2015 10:51    Scheduled Meds: . acidophilus  1 capsule Oral Daily  . fluconazole  100 mg Oral Daily  . nystatin  5 mL Oral QID  . pantoprazole (PROTONIX) IV  40 mg Intravenous QHS  . senna-docusate  2 tablet Oral Daily  . sodium chloride  10-40 mL Intracatheter Q12H  . vitamin B-12  100 mcg Oral Daily   Continuous Infusions: . lactated ringers 100 mL/hr at 07/27/15 [id number]    Principal Problem:   Breast cancer of upper-outer quadrant of left female breast ([facility]) Active Problems:   Ascites   Peritonitis ([facility])   SBP (spontaneous bacterial peritonitis) ([facility])   Abdominal pain   SOB (shortness of  breath)   DDD (degenerative disc disease), lumbar   Severe sepsis ([place])   Sepsis ([place])   Metastasis to liver ([place])    Time spent:25 minutes    Nyashia Raney  [facility] Pager [phone] If 7PM-7AM, please contact night-coverage at [email or web address], password [facility] 07/27/2015, 11:23 AM  LOS: 10 days

## 2015-07-28 ENCOUNTER — Telehealth: Payer: Self-pay | Admitting: Hematology and Oncology

## 2015-07-28 LAB — COMPREHENSIVE METABOLIC PANEL
ALBUMIN: 2.1 g/dL — AB (ref 3.5–5.0)
ALK PHOS: 410 U/L — AB (ref 38–126)
ALT: 108 U/L — ABNORMAL HIGH (ref 14–54)
AST: 397 U/L — AB (ref 15–41)
Anion gap: 12 (ref 5–15)
BILIRUBIN TOTAL: 2 mg/dL — AB (ref 0.3–1.2)
BUN: 24 mg/dL — AB (ref 6–20)
CALCIUM: 7.9 mg/dL — AB (ref 8.9–10.3)
CO2: 27 mmol/L (ref 22–32)
CREATININE: 0.63 mg/dL (ref 0.44–1.00)
Chloride: 88 mmol/L — ABNORMAL LOW (ref 101–111)
GFR calc Af Amer: >60 mL/min (ref >60)
GLUCOSE: 132 mg/dL — AB (ref 65–99)
Potassium: 4.8 mmol/L (ref 3.5–5.1)
Sodium: 127 mmol/L — ABNORMAL LOW (ref 135–145)
TOTAL PROTEIN: 4.9 g/dL — AB (ref 6.5–8.1)

## 2015-07-28 MED ORDER — LIP MEDEX EX OINT
TOPICAL_OINTMENT | CUTANEOUS | Status: DC | PRN
  Administered 2015-07-28: 15:00:00 via TOPICAL
  Filled 2015-07-28: qty 7
  Filled 2015-07-28: qty 10

## 2015-07-28 MED ORDER — HYDROCORTISONE 2.5 % RE CREA
TOPICAL_CREAM | Freq: Two times a day (BID) | RECTAL | Status: DC | PRN
  Administered 2015-07-28: 04:00:00 via RECTAL
  Filled 2015-07-28 (×2): qty 28.35

## 2015-07-28 NOTE — Telephone Encounter (Signed)
Spoke with patient and she is aware of her appointments,she is an inpatient now and will also get appts at discharge,i spoke with her about a chemo ed and she states that a nurse talked with her about chemo and gave her a few books   Dawn Haynes

## 2015-07-28 NOTE — Progress Notes (Signed)
[facility] PROGRESS NOTE  Dawn Haynes [id number] DOB: December 13, 1980 DOA: 07/17/2015 PCP: Mora Bellman, MD  Brief narrative 35 y/o female with recently diagnosed left breast cancer. she felt a lump in her left upper outer quadrant for almost 1 year which is slowly increasing in size and becoming painful. A mammogram and ultrasound showed 5.3 cm mass with abnormal left axillary lymph nodes. Biopsy was positive for high-grade triple negative breast cancer. She was presented to the tumor board on 1/4 and then saw her oncologist Dr. [name] the same day. She was found to be tachycardic and tachypneic with abdominal distention (which she informs for the past 1 month). She was then sent to the ED for further management and paracentesis. ED she underwent diagnostic and therapeutic paracentesis with about 6 L bloody fluid removed. It showed about 20,00 white cells. She was septic with tachycardia, tachypnea and low blood pressure with markedly elevated lactic acid (5.68) She was placed on IV vancomycin and Zosyn concern for SBP and sepsis and admitted to stepdown unit.   Assessment/plan  Sepsis   secondary to high tumor burden with liver metastases and malignant ascites. Initial concern for SBP on presentation. Received aggressive hydration for persistent lactic acid elevation which was due to high tumor burden and liver failure. All cultures have been negative. Received total days of IV antibiotics.   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 CT abdomen and pelvis done on 1/4 showing enlarged liver largely replaced by extensive confluent metastatic disease with large volume ascites mainly in the pelvic area. Also has mass effect on the right kidney by and large right liver lobe. Has significant transaminitis. - Dr. [name] following.  Port A cath placed by surgery on 1/11 and artery on chemotherapy on 1/12. Overall prognosis is poor. Dr [name]  discussed options for palliative care with the patient but she is very hopeful to improve at this time.  -Palliative care has been consulted and will be following while in the hospital.  Metastatic liver disease with ascites Hepatitis panel is negative. CT abdomen shows  enlarged liver which is  largely replaced by extensive confluent metastatic disease. s/p paracentesis with 6L removal on 1/4 and 5.7 L removal on 1/7. Has frequent reaccumulation. Underwent large-volume paracentesis again on 1/12 with 4.9 L blood-tinged fluid removed. Oncologist recommends percutaneous peritoneal drain placement to provide self drain for the patient at home. -Scheduled to be placed on 1/16 by IR.   Hyponatremia  Initially thought to be hypovolemic. All appears to be associated with ascites. Will restrict fluid intake.  Hyperkalemia Stable on telemetry. Improved    Protein calorie malnutrition  Dietitian consulted  Sinus tachycardia  likely in the setting of extensive metastatic disease. Not in pain at this time. TSH normal.  Oral thrush Added Diflucan and nystatin.  Prognosis is guarded due to advanced extensive liver mets with breast cancer at risk for multiorgan dysfunction.  Palliative care consulted.   Code Status: Full code Family Communication: none at bedside Disposition Plan: Home on Monday after percutaneous drain placed in   Consultants:  Dr. Carolyn Stare surgery ( for [facility] A cath placement)  Procedures:  CT abdomen and pelvis  Large-volume paracenteses 1/4, 1/7, 1/12  Antibiotics:  IV Zosyn since 1/4-1/11    HPI/Subjective: Seen and examined. Has distended abdomen with ascites. Denies any pain.  Objective: Filed Vitals:   07/27/15 2042 07/28/15 0459  BP: 105/73 105/62  Pulse: 127 121  Temp: 98.2 F (  36.8 C) 98.1 F (36.7 C)  Resp: 22 19    Intake/Output Summary (Last 24 hours) at 07/28/15 1200 Last data filed at 07/28/15 0500  Gross per 24 hour   Intake   1446 ml  Output      0 ml  Net   1446 ml   Filed Weights   07/18/15 0357 07/20/15 0424 07/26/15 1245  Weight: 73.4 kg (161 lb 13.1 oz) 82.5 kg (181 lb 14.1 oz) 78.9 kg (173 lb 15.1 oz)    Exam:   General:   Fatigued,  HEENT: Oral thrush improved  Cardiovascular: S1 and S2 tachycardic, no murmurs  Respiratory: Clear bilaterally, no added sounds, right-sided Port-A-Cath.  Abdomen: Increased abdominal distention with ascites. Nontender  Musculoskeletal: Warm, trace edema bilaterally  CNS: Alert and oriented  Data Reviewed: Basic Metabolic Panel:  Recent Labs Lab 07/24/15 0515 07/25/15 0807 07/26/15 0400 07/27/15 1010 07/28/15 0415  NA 126* 127* 129* 129* 127*  K 5.0 5.5* 5.0 4.5 4.8  CL 89* 89* 93* 92* 88*  CO2 26 28 24 26 27   GLUCOSE 113* 120* 135* 131* 132*  BUN 25* 26* 30* 24* 24*  CREATININE 0.79 0.69 0.68 0.59 0.63  CALCIUM 8.9 8.8* 8.4* 8.3* 7.9*   Liver Function Tests:  Recent Labs Lab 07/24/15 0515 07/25/15 0807 07/26/15 0400 07/27/15 1010 07/28/15 0415  AST 714* 629* 439* 420* 397*  ALT 227* 184* 122* 118* 108*  ALKPHOS 548* 514* 434* 447* 410*  BILITOT 1.2 1.5* 1.8* 1.6* 2.0*  PROT 5.5* 5.5* 5.0* 5.0* 4.9*  ALBUMIN 2.3* 2.2* 2.5* 2.3* 2.1*   No results for input(s): LIPASE, AMYLASE in the last 168 hours. No results for input(s): AMMONIA in the last 168 hours. CBC:  Recent Labs Lab 07/23/15 0350 07/24/15 0515 07/25/15 0807 07/26/15 0400 07/27/15 1010  WBC 15.6* 16.6* 18.8* 19.5* 16.8*  HGB 12.1 11.5* 11.1* 9.9* 10.7*  HCT 37.6 35.6* 34.5* 30.6* 33.6*  MCV 84.1 82.8 83.5 83.4 84.4  PLT 454* 455* 473* 420* 325   Cardiac Enzymes: No results for input(s): CKTOTAL, CKMB, CKMBINDEX, TROPONINI in the last 168 hours. BNP (last 3 results) No results for input(s): BNP in the last 8760 hours.  ProBNP (last 3 results) No results for input(s): PROBNP in the last 8760 hours.  CBG: No results for input(s): GLUCAP in the last 168  hours.  No results found for this or any previous visit (from the past 240 hour(s)).   Studies: No results found.  Scheduled Meds: . acidophilus  1 capsule Oral Daily  . fluconazole  100 mg Oral Daily  . nystatin  5 mL Oral QID  . pantoprazole (PROTONIX) IV  40 mg Intravenous QHS  . senna-docusate  2 tablet Oral Daily  . sodium chloride  10-40 mL Intracatheter Q12H  . vitamin B-12  100 mcg Oral Daily   Continuous Infusions:    Principal Problem:   Breast cancer of upper-outer quadrant of left female breast (HCC) Active Problems:   Ascites   Peritonitis (HCC)   SBP (spontaneous bacterial peritonitis) (HCC)   Abdominal pain   SOB (shortness of breath)   DDD (degenerative disc disease), lumbar   Severe sepsis ([place])   Sepsis ([place])   Metastasis to liver ([place])    Time spent:25 minutes    Ennio Houp  [facility] Pager [phone] If 7PM-7AM, please contact night-coverage at [email or web address], password [facility] [place] 07/28/2015, 12:00 PM  LOS: 11 days

## 2015-07-29 ENCOUNTER — Inpatient Hospital Stay (HOSPITAL_COMMUNITY): Payer: Medicaid Other

## 2015-07-29 DIAGNOSIS — R7401 Elevation of levels of liver transaminase levels: Secondary | ICD-10-CM | POA: Diagnosis present

## 2015-07-29 DIAGNOSIS — R740 Nonspecific elevation of levels of transaminase and lactic acid dehydrogenase [LDH]: Secondary | ICD-10-CM

## 2015-07-29 DIAGNOSIS — R1084 Generalized abdominal pain: Secondary | ICD-10-CM

## 2015-07-29 DIAGNOSIS — E871 Hypo-osmolality and hyponatremia: Secondary | ICD-10-CM | POA: Diagnosis not present

## 2015-07-29 DIAGNOSIS — Z95828 Presence of other vascular implants and grafts: Secondary | ICD-10-CM | POA: Insufficient documentation

## 2015-07-29 DIAGNOSIS — E875 Hyperkalemia: Secondary | ICD-10-CM | POA: Diagnosis not present

## 2015-07-29 DIAGNOSIS — R180 Malignant ascites: Secondary | ICD-10-CM | POA: Insufficient documentation

## 2015-07-29 DIAGNOSIS — R0602 Shortness of breath: Secondary | ICD-10-CM

## 2015-07-29 LAB — COMPREHENSIVE METABOLIC PANEL
ALBUMIN: 1.9 g/dL — AB (ref 3.5–5.0)
ALT: 95 U/L — AB (ref 14–54)
AST: 387 U/L — ABNORMAL HIGH (ref 15–41)
Alkaline Phosphatase: 362 U/L — ABNORMAL HIGH (ref 38–126)
Anion gap: 9 (ref 5–15)
BUN: 18 mg/dL (ref 6–20)
CHLORIDE: 89 mmol/L — AB (ref 101–111)
CO2: 29 mmol/L (ref 22–32)
CREATININE: 0.57 mg/dL (ref 0.44–1.00)
Calcium: 8.1 mg/dL — ABNORMAL LOW (ref 8.9–10.3)
GFR calc non Af Amer: >60 mL/min (ref >60)
GLUCOSE: 98 mg/dL (ref 65–99)
Potassium: 5.4 mmol/L — ABNORMAL HIGH (ref 3.5–5.1)
SODIUM: 127 mmol/L — AB (ref 135–145)
Total Bilirubin: 1.9 mg/dL — ABNORMAL HIGH (ref 0.3–1.2)
Total Protein: 5 g/dL — ABNORMAL LOW (ref 6.5–8.1)

## 2015-07-29 MED ORDER — CEFAZOLIN SODIUM-DEXTROSE 2-3 GM-% IV SOLR
2.0000 g | INTRAVENOUS | Status: AC
  Administered 2015-07-29: 2 g via INTRAVENOUS

## 2015-07-29 MED ORDER — FENTANYL CITRATE (PF) 100 MCG/2ML IJ SOLN
INTRAMUSCULAR | Status: AC | PRN
  Administered 2015-07-29 (×3): 25 ug via INTRAVENOUS

## 2015-07-29 MED ORDER — MIDAZOLAM HCL 2 MG/2ML IJ SOLN
INTRAMUSCULAR | Status: AC
  Filled 2015-07-29: qty 4

## 2015-07-29 MED ORDER — HEPARIN SOD (PORK) LOCK FLUSH 100 UNIT/ML IV SOLN
500.0000 [IU] | Freq: Once | INTRAVENOUS | Status: DC
  Filled 2015-07-29: qty 5

## 2015-07-29 MED ORDER — CEFAZOLIN SODIUM-DEXTROSE 2-3 GM-% IV SOLR
INTRAVENOUS | Status: AC
  Filled 2015-07-29: qty 50

## 2015-07-29 MED ORDER — OXYCODONE HCL 5 MG PO TABS: 5.0000 mg | ORAL_TABLET | ORAL | Status: DC | PRN

## 2015-07-29 MED ORDER — LIDOCAINE HCL 1 % IJ SOLN
INTRAMUSCULAR | Status: AC
  Filled 2015-07-29: qty 20

## 2015-07-29 MED ORDER — FENTANYL CITRATE (PF) 100 MCG/2ML IJ SOLN
INTRAMUSCULAR | Status: AC
  Filled 2015-07-29: qty 2

## 2015-07-29 MED ORDER — CEFAZOLIN SODIUM-DEXTROSE 2-3 GM-% IV SOLR: 2.0000 g | INTRAVENOUS | Status: DC

## 2015-07-29 MED ORDER — ONDANSETRON HCL 4 MG PO TABS: 4.0000 mg | ORAL_TABLET | Freq: Three times a day (TID) | ORAL | Status: DC | PRN

## 2015-07-29 MED ORDER — SENNOSIDES-DOCUSATE SODIUM 8.6-50 MG PO TABS: 2.0000 | ORAL_TABLET | Freq: Every day | ORAL | Status: DC

## 2015-07-29 MED ORDER — FLUCONAZOLE 100 MG PO TABS: 100.0000 mg | ORAL_TABLET | Freq: Every day | ORAL | Status: AC

## 2015-07-29 MED ORDER — MIDAZOLAM HCL 2 MG/2ML IJ SOLN
INTRAMUSCULAR | Status: AC | PRN
  Administered 2015-07-29: 1 mg via INTRAVENOUS
  Administered 2015-07-29 (×2): 0.5 mg via INTRAVENOUS

## 2015-07-29 NOTE — Procedures (Signed)
Interventional Radiology Procedure Note  Procedure:  Tunneled peritoneal drainage catheter placement  Complications:  None  Estimated Blood Loss: < 10 mL  15.5 Fr tunneled peritoneal PleurX drain placed via right abdominal approach.  Paracentesis performed.  Dawn Haynes. Kathlene Cote, M.D Pager:  [phone]

## 2015-07-29 NOTE — Progress Notes (Signed)
Unable to do complete comprehensive palliative consultation during this hospitalization- recommend outpatient referral for PC services in the community and well as [facility] social worker referral for advance care planning. Will follow patient in electronic record.  Lane Hacker, DO Palliative Medicine [phone]

## 2015-07-29 NOTE — Discharge Summary (Signed)
Physician Discharge Summary  Dawn Haynes WHQ:[id number] DOB: February 22, 1981 DOA: 07/17/2015  PCP: Mora Bellman, MD  Admit date: 07/17/2015 Discharge date: 07/29/2015  Time spent: 35 minutes  Recommendations for Outpatient Follow-up:  1. D/c home with [facility]. Patient being discharged on peritoneal catheter with port for draining asciitic fluid at home.   Discharge Diagnoses:  Principal Problem: Severe sepsis     Active Problems:   Ascites   Abdominal pain   SOB (shortness of breath)   DDD (degenerative disc disease), lumbar   Severe sepsis (HCC)   Metastatic breast cancer  to liver (HCC)   Hyponatremia   Hyperkalemia   Transaminitis   Discharge Condition: guarded  Diet recommendation: regular  Filed Weights   07/18/15 0357 07/20/15 0424 07/26/15 1245  Weight: 73.4 kg (161 lb 13.1 oz) 82.5 kg (181 lb 14.1 oz) 78.9 kg (173 lb 15.1 oz)    History of present illness:  35 y/o female with recently diagnosed left breast cancer. she felt a lump in her left upper outer quadrant for almost 1 year which is slowly increasing in size and becoming painful. A mammogram and ultrasound showed 5.3 cm mass with abnormal left axillary lymph nodes. Biopsy was positive for high-grade triple negative breast cancer. She was presented to the tumor board on 1/4 and then saw her oncologist Dr. [name] the same day. She was found to be tachycardic and tachypneic with abdominal distention (which she informs for the past 1 month). She was then sent to the ED for further management and paracentesis. ED she underwent diagnostic and therapeutic paracentesis with about 6 L bloody fluid removed. It showed about 20,00 white cells. She was septic with tachycardia, tachypnea and low blood pressure with markedly elevated lactic acid (5.68) She was placed on IV vancomycin and Zosyn concern for SBP and sepsis and admitted to stepdown unit.  Hospital Course:  Sepsis  secondary to high tumor burden with liver metastases  and malignant ascites. Initial concern for SBP on presentation which seems less likely. Received aggressive hydration for persistent lactic acid elevation which was due to high tumor burden and liver failure. All cultures have been negative. Received total 8 days of IV antibiotics.  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 CT abdomen and pelvis done on 1/4 showing enlarged liver largely replaced by extensive confluent metastatic disease with large volume ascites mainly in the pelvic area. Also has mass effect on the right kidney by and large right liver lobe. Has significant transaminitis. - Dr. [name] following. Port A cath placed by surgery on 1/11 and artery on chemotherapy on 1/12. Overall prognosis is poor. Dr [name] discussed options for palliative care with the patient but she is very hopeful to improve at this time.  -plan on arranging discussion with outpatient palliative care during follow up with Dr [name].   Metastatic liver disease with ascites Hepatitis panel is negative. CT abdomen shows enlarged liver which is largely replaced by extensive confluent metastatic disease.  s/p paracentesis with 6L removal on 1/4, 5.7 L  on 1/7 and 4.9 L on 1/12. Marland Kitchen Has frequent reaccumulation. Placed a  percutaneous peritoneal drain by IR today for draining ascitic fluid at home. Patient tolerated procedure well.  Ordered HHRN.   Hyponatremia  Initially thought to be hypovolemic. All appears to be associated with ascites. Monitored on fluid restriction.  Hyperkalemia Stable on telemetry. Follow as outpatient.   Protein calorie malnutrition  Dietitian consulted  Sinus tachycardia  likely in  the setting of extensive metastatic disease. Not in pain at this time. TSH normal.  Oral thrush Added Diflucan and nystatin. Improved.  Prognosis is guarded due to advanced extensive liver mets with breast cancer at risk for multiorgan  dysfunction.  Palliative care consulted while inpatient.    Code Status: Full code Family Communication: none at bedside Disposition Plan: Home with home health   Consultants:  Dr. Carolyn Stare surgery ( for [facility] A cath placement)  IR  Procedures:  CT abdomen and pelvis  Large-volume paracenteses 1/4, 1/7, 1/12  Antibiotics:  IV Zosyn since 1/4-1/11  Discharge Exam: Filed Vitals:   07/29/15 1411 07/29/15 1413  BP: 123/89 126/85  Pulse: 101 98  Temp:    Resp: 14 15     General: Fatigued, not in distress  HEENT: Oral thrush improved  Cardiovascular: S1 and S2 tachycardic, no murmurs  Respiratory: Clear bilaterally, no added sounds, right-sided Port-A-Cath.  Abdomen: peritoneal percutaneous catheter site clean,  Nontender, abdominal distention improved.   Musculoskeletal: Warm, trace edema bilaterally  CNS: Alert and oriented  Discharge Instructions   Discharge Instructions    TREATMENT CONDITIONS    Complete by:  As directed   Patient should have CBC & CMP within 7 days prior to chemotherapy administration. NOTIFY MD IF: ANC < 1500, Hemoglobin < 8, PLT < 100,000,  Total Bili > 1.5, Creatinine > 1.5, ALT & AST > 80 or if patient has unstable vital signs: Temperature > 38.5, SBP > 180 or < 90, RR > 30 or HR > 100.          Current Discharge Medication List    START taking these medications   Details  fluconazole (DIFLUCAN) 100 MG tablet Take 1 tablet (100 mg total) by mouth daily. Qty: 4 tablet, Refills: 0    ondansetron (ZOFRAN) 4 MG tablet Take 1 tablet (4 mg total) by mouth every 8 (eight) hours as needed for nausea or vomiting. Qty: 30 tablet, Refills: 0    oxyCODONE (OXY IR/ROXICODONE) 5 MG immediate release tablet Take 1 tablet (5 mg total) by mouth every 4 (four) hours as needed for moderate pain. Qty: 30 tablet, Refills: 0    senna-docusate (SENOKOT-S) 8.6-50 MG tablet Take 2 tablets by mouth daily. Qty: 20 tablet, Refills: 0       CONTINUE these medications which have NOT CHANGED   Details  BORON PO Take 1 tablet by mouth 3 (three) times daily as needed (gas/flatulence).    Cholecalciferol (VITAMIN D) 2000 units CAPS Take 2,000 Units by mouth daily.     Probiotic Product (SOLUBLE FIBER/PROBIOTICS PO) Take 1 tablet by mouth daily.     UNABLE TO FIND Take 1 tablet by mouth daily. Herbal Supplement for-Liver support    vitamin B-12 (CYANOCOBALAMIN) 100 MCG tablet Take 100 mcg by mouth daily.       No Known Allergies Follow-up Information    Follow up with Rulon Eisenmenger, MD. Call in 1 week.   Specialty:  Hematology and Oncology   Contact information:   [place] [id number] [phone]        The results of significant diagnostics from this hospitalization (including imaging, microbiology, ancillary and laboratory) are listed below for reference.    Significant Diagnostic Studies: Dg Chest 2 View  07/17/2015  CLINICAL DATA:  Shortness of breath developing over the past few days. EXAM: CHEST  2 VIEW COMPARISON:  04/26/2010 FINDINGS: Hypoventilation without pneumonia or edema. No effusion or  pneumothorax. Normal heart size and mediastinal contours. Mild thoracic dextrocurvature, likely accentuated by positioning. IMPRESSION: No active cardiopulmonary disease. Electronically Signed   By: Monte Fantasia M.D.   On: 07/17/2015 11:38   Ct Abdomen Pelvis W Contrast  07/17/2015  CLINICAL DATA:  Left breast cancer. Abdominal swelling and right abdominal pain. EXAM: CT ABDOMEN AND PELVIS WITH CONTRAST TECHNIQUE: Multidetector CT imaging of the abdomen and pelvis was performed using the standard protocol following bolus administration of intravenous contrast. CONTRAST:  130m OMNIPAQUE IOHEXOL 300 MG/ML  SOLN COMPARISON:  No prior CT abdomen/ pelvis. 06/11/2015 abdominal sonogram. FINDINGS: Lower chest: No significant pulmonary nodules or acute consolidative airspace disease. Oral contrast in the lower  thoracic esophagus indicates esophageal dysmotility and/ or gastroesophageal reflux. Hepatobiliary: The liver is enlarged and largely replaced by innumerable (> 30) confluent heterogeneously hypoenhancing liver masses. Representative liver masses include a 7.1 x 5.4 cm segment 4A left liver lobe mass (series 2/ image 21), a 5.4 x 4.9 cm segment 6 right liver lobe mass (series 2/ image 45) and a 4.9 x 3.8 cm segment 5 right liver lobe mass (series 2/image 48). Status post cholecystectomy. No biliary ductal dilatation. Pancreas: Normal, with no mass or duct dilation. Spleen: Normal size. No mass. Adrenals/Urinary Tract: Normal adrenals. No hydronephrosis and no renal mass. There is mass-effect on right kidney by the enlarged right liver lobe. Normal bladder. Stomach/Bowel: Grossly normal stomach. Normal caliber small bowel with no small bowel wall thickening. Normal appendix. Normal large bowel with no diverticulosis, large bowel wall thickening or pericolonic fat stranding. Vascular/Lymphatic: Normal caliber abdominal aorta. Patent portal, splenic and renal veins. Hepatic veins appear patent and narrowed by surrounding liver masses. No pathologically enlarged lymph nodes in the abdomen or pelvis. Reproductive: Grossly normal uterus.  No adnexal mass. Other: No pneumoperitoneum. Persistent moderate to large volume ascites, predominantly in the pelvis. Musculoskeletal: No aggressive appearing focal osseous lesions. IMPRESSION: 1. Liver is enlarged and largely replaced by extensive confluent metastatic disease. 2. Persistent moderate to large volume ascites, predominantly pelvic. 3. No abdominopelvic lymphadenopathy. 4. Mass-effect on the right kidney by the enlarged right liver lobe. No hydronephrosis. 5. Status post cholecystectomy.  No biliary ductal dilatation. 6. Oral contrast in the lower thoracic esophagus, suggesting esophageal dysmotility and/or gastroesophageal reflux. Electronically Signed   By: JIlona Sorrel M.D.   On: 07/17/2015 17:40   [place]acentesis  07/25/2015  INDICATION: Metastatic breast cancer, recurrent ascites. Request made for therapeutic paracentesis. EXAM: ULTRASOUND-GUIDED THERAPEUTIC PARACENTESIS COMPARISON:  Prior paracentesis on 07/20/15 MEDICATIONS: None. COMPLICATIONS: None immediate TECHNIQUE: Informed written consent was obtained from the patient after a discussion of the risks, benefits and alternatives to treatment. A timeout was performed prior to the initiation of the procedure. Initial ultrasound scanning demonstrates a large amount of ascites within the left lower abdominal quadrant. The left lower abdomen was prepped and draped in the usual sterile fashion. 1% lidocaine was used for local anesthesia. Under direct ultrasound guidance, a 19 gauge, 7-cm, Yueh catheter was introduced. An ultrasound image was saved for documentation purposed. The paracentesis was performed. The catheter was removed and a dressing was applied. The patient tolerated the procedure well without immediate post procedural complication. FINDINGS: A total of approximately 4.9 liters of blood-tinged fluid was removed. IMPRESSION: Successful ultrasound-guided therapeutic paracentesis yielding 4.9 liters of peritoneal fluid. Read by: KRowe Robert PA-C Electronically Signed   By: HJacqulynn CadetM.D.   On: 07/25/2015 17:24   [place]acent[place]  07/20/2015  CLINICAL DATA:  Ascites EXAM: ULTRASOUND GUIDED PARACENTESIS TECHNIQUE: Survey ultrasound of the abdomen was performed and an appropriate skin entry site in the right lower abdomen was selected. Skin site was marked, prepped with Betadine, and draped in usual sterile fashion, and infiltrated locally with 1% lidocaine. A 5 French multisidehole Yueh sheath needle was advanced into the peritoneal space until fluid could be aspirated. The sheath was advanced and the needle removed. 5.7 L of serosanguineous ascites were aspirated. COMPLICATIONS: None IMPRESSION:  Technically successful ultrasound guided paracentesis, removing 5.7 L of ascites. Electronically Signed   By: Marybelle Killings M.D.   On: 07/20/2015 12:06   US Paracentesis  07/17/2015  INDICATION: Breast cancer, ascites. Request is made for diagnostic and therapeutic paracentesis. EXAM: ULTRASOUND-GUIDED DIAGNOSTIC AND THERAPEUTIC PARACENTESIS COMPARISON:  None. MEDICATIONS: None. COMPLICATIONS: None immediate TECHNIQUE: Informed written consent was obtained from the patient after a discussion of the risks, benefits and alternatives to treatment. A timeout was performed prior to the initiation of the procedure. Initial ultrasound scanning demonstrates a large amount of ascites within the right lower abdominal quadrant. The right lower abdomen was prepped and draped in the usual sterile fashion. 1% lidocaine was used for local anesthesia. Under direct ultrasound guidance, a 19 gauge, 7-cm, Yueh catheter was introduced. An ultrasound image was saved for documentation purposed. The paracentesis was performed. The catheter was removed and a dressing was applied. The patient tolerated the procedure well without immediate post procedural complication. FINDINGS: A total of approximately 6 liters of turbid, bloody fluid was removed. Samples were sent to the laboratory as requested by the clinical team. IMPRESSION: Successful ultrasound-guided diagnostic and therapeutic paracentesis yielding 6 liters of peritoneal fluid. Dr. [name] was notified of the above findings. Read by: Rowe Robert, PA-C Electronically Signed   By: Sandi Mariscal M.D.   On: 07/17/2015 15:37   Ir Perc Athena Masse Perit Cath [facility]  07/29/2015  CLINICAL DATA:  Refractory malignant ascites due to stage IV breast carcinoma requiring multiple paracentesis procedures. The patient has been referred for placement of a tunneled peritoneal drainage catheter. EXAM: INSERTION OF TUNNELED PERITONEAL DRAINAGE CATHETER ANESTHESIA/SEDATION: 2.0 Mg IV Versed; 75 mcg IV  Fentanyl. Total Moderate Sedation Time 29 minutes. MEDICATIONS: 2 g IV Ancef. As antibiotic prophylaxis, Ancef was ordered pre-procedure and administered intravenously within one hour of incision. FLUOROSCOPY TIME:  6 seconds. PROCEDURE: The procedure, risks, benefits, and alternatives were explained to the patient. Questions regarding the procedure were encouraged and answered. The patient understands and consents to the procedure. The right abdominal wall was prepped with Betadine in a sterile fashion, and a sterile drape was applied covering the operative field. A sterile gown and sterile gloves were used for the procedure. Local anesthesia was provided with 1% Lidocaine. Ultrasound image documentation was performed. Fluoroscopy during the procedure and fluoroscopic spot radiograph confirms appropriate catheter position. After creating a small skin incision, a 19 gauge needle was advanced into the peritoneal cavity under ultrasound guidance. A guide wire was then advanced under fluoroscopy into the peritoneal cavity. Peritoneal access was dilated serially and a 16-French peel-away sheath placed. A 15.5 French tunneled peritoneal PleurX catheter was placed. This was tunneled from an incision 5 cm below the peritoneal access to the access site. The catheter was advanced through the peel-away sheath. The sheath was then removed. Final catheter positioning was confirmed with a fluoroscopic spot image. The peritoneal access incision was closed with subcutaneous 3-0 Monocryl and subcuticular 4-0 Vicryl. Dermabond was applied to the incision. A Prolene  retention suture was applied at the catheter exit site. Large volume paracentesis was performed through the new catheter utilizing drainage bottles. COMPLICATIONS: None. FINDINGS: The catheter was placed via the right abdominal wall. Catheter course is across the midline into the left peritoneal cavity. Approximately 5.3 liters of ascites was able to be removed after  catheter placement. IMPRESSION: Placement of tunneled peritoneal drainage catheter via right abdominal approach. 5.3 liters of ascites was removed today after catheter placement. Electronically Signed   By: Aletta Edouard M.D.   On: 07/29/2015 14:51   Dg Chest Port 1 View  07/26/2015  CLINICAL DATA:  Leukocytosis EXAM: PORTABLE CHEST 1 VIEW COMPARISON:  Two days ago FINDINGS: Right subclavian porta catheter with tip at the SVC level. Low lung volumes without edema, consolidation, effusion, or pneumothorax. Normal cardiomediastinal silhouette. IMPRESSION: Negative chest. Electronically Signed   By: Monte Fantasia M.D.   On: 07/26/2015 10:51   Dg Chest Port 1 View  07/24/2015  CLINICAL DATA:  Postop right Port-A-Cath placement, left breast cancer EXAM: PORTABLE CHEST 1 VIEW COMPARISON:  07/17/2015 FINDINGS: Right Port-A-Cath placed with no pneumothorax. Catheter tip extends about 13 mm beyond the cavoatrial junction into the right atrium. Heart size and vascular pattern normal. IMPRESSION: Port-A-Cath as described with no pneumothorax. Electronically Signed   By: Skipper Cliche M.D.   On: 07/24/2015 16:55   Mm Digital Diagnostic Unilat L  07/09/2015  CLINICAL DATA:  Post biopsy mammogram of the left breast for clip placement. EXAM: DIAGNOSTIC LEFT MAMMOGRAM POST ULTRASOUND BIOPSY COMPARISON:  Previous exam(s). FINDINGS: Mammographic images were obtained following ultrasound guided biopsy of a left breast mass at 130 and a left axillary lymph node. The coil shaped biopsy marking clip is appropriately positioned within the mass in the left breast at 130. The [facility] spiral shaped biopsy marking clip was not visualized despite multiple attempts, however was clearly seen by ultrasound. IMPRESSION: 1. Appropriate positioning of the coil shaped biopsy marking clip at the intended site of biopsy at 130 in the left breast. 2. The [employer] spiral shaped biopsy marking clip within the left axillary lymph node is  not visualized mammographically, likely due to deep positioning. This was able to be visualized post deployment by ultrasound. Final Assessment: Post Procedure Mammograms for Marker Placement Electronically Signed   By: Ammie Ferrier M.D.   On: 07/09/2015 14:44   Dg C-arm 1-60 Min-no Report  07/24/2015  CLINICAL DATA: port a cath insertion C-ARM 1-60 MINUTES Fluoroscopy was utilized by the requesting physician.  No radiographic interpretation.   US Breast Ltd Uni Left Inc Axilla  07/02/2015  CLINICAL DATA:  Enlarging mass felt by the patient in the axillary tail region of the left breast for the past year. She reports that the mass was initially very small. She finished breast feeding 2 months ago. EXAM: DIGITAL DIAGNOSTIC BILATERAL MAMMOGRAM WITH 3D TOMOSYNTHESIS WITH CAD ULTRASOUND LEFT BREAST COMPARISON:  Previous exam(s). ACR Breast Density Category c: The breast tissue is heterogeneously dense, which may obscure small masses. FINDINGS: 3D tomographic images of both breasts demonstrate a large, oval, lobulated mass in the posterior aspect of the upper-outer left breast, corresponding to the mass felt by the patient. There are no findings on the right suspicious for malignancy. Mammographic images were processed with CAD. On physical exam, the patient has an approximately 8 cm rounded, protruding mass in the axillary tail region of the left breast. There are also mildly palpable left inferior axillary lymph nodes. Targeted ultrasound is performed,  showing a large, poorly defined, heterogeneous mass in the 1:30 o'clock position of the left breast, 15 cm from the nipple. This has peripheral echogenic components and central hypoechoic components. The mass measures 5.3 x 5.2 x 2.7 cm in maximum dimensions and has internal blood flow with power Doppler. Ultrasound of the left axilla demonstrated multiple abnormal appearing left axillary lymph nodes. Some have an architecture similar to the breast mass and  some are diffusely hypoechoic and rounded. IMPRESSION: 1. 5.3 x 5.2 x 2.7 cm mass in the 1:30 o'clock position of the left breast with imaging features highly suspicious for malignancy. 2. 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 RECOMMENDATION: Ultrasound-guided core needle biopsies of the left breast mass and one of the abnormal appearing left axillary lymph nodes. These have been scheduled at 1 p.m. on 07/09/2015. I have discussed the findings and recommendations with the patient. Results were also provided in writing at the conclusion of the visit. If applicable, a reminder letter will be sent to the patient regarding the next appointment. BI-RADS CATEGORY  5: Highly suggestive of malignancy. Electronically Signed   By: Claudie Revering M.D.   On: 07/02/2015 17:29   Mm Diag Breast Tomo Bilateral  07/02/2015  CLINICAL DATA:  Enlarging mass felt by the patient in the axillary tail region of the left breast for the past year. She reports that the mass was initially very small. She finished breast feeding 2 months ago. EXAM: DIGITAL DIAGNOSTIC BILATERAL MAMMOGRAM WITH 3D TOMOSYNTHESIS WITH CAD ULTRASOUND LEFT BREAST COMPARISON:  Previous exam(s). ACR Breast Density Category c: The breast tissue is heterogeneously dense, which may obscure small masses. FINDINGS: 3D tomographic images of both breasts demonstrate a large, oval, lobulated mass in the posterior aspect of the upper-outer left breast, corresponding to the mass felt by the patient. There are no findings on the right suspicious for malignancy. Mammographic images were processed with CAD. On physical exam, the patient has an approximately 8 cm rounded, protruding mass in the axillary tail region of the left breast. There are also mildly palpable left inferior axillary lymph nodes. Targeted ultrasound is performed, showing a large, poorly defined, heterogeneous mass in the 1:30 o'clock position of the left  breast, 15 cm from the nipple. This has peripheral echogenic components and central hypoechoic components. The mass measures 5.3 x 5.2 x 2.7 cm in maximum dimensions and has internal blood flow with power Doppler. Ultrasound of the left axilla demonstrated multiple abnormal appearing left axillary lymph nodes. Some have an architecture similar to the breast mass and some are diffusely hypoechoic and rounded. IMPRESSION: 1. 5.3 x 5.2 x 2.7 cm mass in the 1:30 o'clock position of the left breast with imaging features highly suspicious for malignancy. 2. 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 RECOMMENDATION: Ultrasound-guided core needle biopsies of the left breast mass and one of the abnormal appearing left axillary lymph nodes. These have been scheduled at 1 p.m. on 07/09/2015. I have discussed the findings and recommendations with the patient. Results were also provided in writing at the conclusion of the visit. If applicable, a reminder letter will be sent to the patient regarding the next appointment. BI-RADS CATEGORY  5: Highly suggestive of malignancy. Electronically Signed   By: Claudie Revering M.D.   On: 07/02/2015 17:29   [place] Lt Breast Bx W Loc Dev 1st Lesion Img Bx Spec US Guide  07/16/2015  ADDENDUM REPORT: 07/10/2015 12:56 ADDENDUM: Pathology reveals Grade  III INVASIVE DUCTAL CARCINOMA of the Left breast at the 1:30 o'clock location. 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 There is invasive ductal carcinoma which may represent a completely replaced lymph node. This was found to be concordant by Dr. Ammie Ferrier. Pathology results were discussed with the patient via telephone. She reported no problems with the biopsy site and is doing well. Post biopsy care and instructions were reviewed and questions were answered. She was encouraged to contact [facility] with any additional questions and or concerns. She  was referred to [facility] on July 17, 2015. Pathology results reported by Terie Purser RN on July 10, 2015. Electronically Signed   By: Ammie Ferrier M.D.   On: 07/10/2015 12:56  07/16/2015  CLINICAL DATA:  35 year old female presenting for ultrasound-guided biopsy of a left breast mass and abnormal left axillary lymph node. EXAM: ULTRASOUND GUIDED LEFT BREAST CORE NEEDLE BIOPSY COMPARISON:  Previous exam(s). FINDINGS: I met with the patient and we discussed the procedure of ultrasound-guided biopsy, including benefits and alternatives. We discussed the high likelihood of a successful procedure. We discussed the risks of the procedure, including infection, bleeding, tissue injury, clip migration, and inadequate sampling. Informed written consent was given. The usual time-out protocol was performed immediately prior to the procedure. Using sterile technique and 1% Lidocaine as local anesthetic, under direct ultrasound visualization, a 14 gauge spring-loaded device was used to perform biopsy of mass in the left breast 130 using a lateral approach. At the conclusion of the procedure a coil shaped tissue marker clip was deployed into the biopsy cavity. Using sterile technique and 1% Lidocaine as local anesthetic, under direct ultrasound visualization, a 14 gauge spring-loaded device was used to perform biopsy of abnormal left axillary lymph node using a inferior approach. This was approached using the same incision site as the site for the breast biopsy. At the conclusion of the procedure a HydroMARK spiral shaped tissue marker clip was deployed into the biopsy cavity. Follow up 2 view mammogram was performed and dictated separately. IMPRESSION: Ultrasound guided biopsy of left breast mass at 130, and a left axillary lymph node. No apparent complications. Electronically Signed: By: Ammie Ferrier M.D. 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 There is invasive ductal carcinoma which may represent a completely replaced lymph node. This was found to be concordant by Dr. Ammie Ferrier. Pathology results were discussed with the patient via telephone. She reported no problems with the biopsy site and is doing well. Post biopsy care and instructions were reviewed and questions were answered. She was encouraged to contact [facility] with any additional questions and or concerns. She was referred to [facility] on July 17, 2015. Pathology results reported by Terie Purser RN on July 10, 2015. Electronically Signed   By: Ammie Ferrier M.D.   On: 07/10/2015 12:56  07/16/2015  CLINICAL DATA:  35 year old female presenting for ultrasound-guided biopsy of a left breast mass and abnormal left axillary lymph node. EXAM: ULTRASOUND GUIDED LEFT BREAST CORE NEEDLE BIOPSY COMPARISON:  Previous exam(s). FINDINGS: I  met with the patient and we discussed the procedure of ultrasound-guided biopsy, including benefits and alternatives. We discussed the high likelihood of a successful procedure. We discussed the risks of the procedure, including infection, bleeding, tissue injury, clip migration, and inadequate sampling. Informed written consent was given. The usual time-out protocol was performed immediately prior to the procedure. Using sterile technique and 1% Lidocaine as local anesthetic, under direct ultrasound visualization, a 14 gauge spring-loaded device was used to perform biopsy of mass in the left breast 130 using a lateral approach. At the conclusion of the procedure a coil shaped  tissue marker clip was deployed into the biopsy cavity. Using sterile technique and 1% Lidocaine as local anesthetic, under direct ultrasound visualization, a 14 gauge spring-loaded device was used to perform biopsy of abnormal left axillary lymph node using a inferior approach. This was approached using the same incision site as the site for the breast biopsy. At the conclusion of the procedure a HydroMARK spiral shaped tissue marker clip was deployed into the biopsy cavity. Follow up 2 view mammogram was performed and dictated separately. IMPRESSION: Ultrasound guided biopsy of left breast mass at 130, and a left axillary lymph node. No apparent complications. Electronically Signed: By: Ammie Ferrier M.D. On: 07/09/2015 14:27    Microbiology: No results found for this or any previous visit (from the past 240 hour(s)).   Labs: Basic Metabolic Panel:  Recent Labs Lab 07/25/15 0807 07/26/15 0400 07/27/15 1010 07/28/15 0415 07/29/15 0605  NA 127* 129* 129* 127* 127*  K 5.5* 5.0 4.5 4.8 5.4*  CL 89* 93* 92* 88* 89*  CO2 _0 GLUCOSE 120* 135* 131* 132* 98  BUN 26* 30* 24* 24* 18  CREATININE 0.69 0.68 0.59 0.63 0.57  CALCIUM 8.8* 8.4* 8.3* 7.9* 8.1*   Liver Function Tests:  Recent Labs Lab 07/25/15 0807 07/26/15 0400 07/27/15 1010 07/28/15 0415 07/29/15 0605  AST 629* 439* 420* 397* 387*  ALT 184* 122* 118* 108* 95*  ALKPHOS 514* 434* 447* 410* 362*  BILITOT 1.5* 1.8* 1.6* 2.0* 1.9*  PROT 5.5* 5.0* 5.0* 4.9* 5.0*  ALBUMIN 2.2* 2.5* 2.3* 2.1* 1.9*   No results for input(s): LIPASE, AMYLASE in the last 168 hours. No results for input(s): AMMONIA in the last 168 hours. CBC:  Recent Labs Lab 07/23/15 0350 07/24/15 0515 07/25/15 0807 07/26/15 0400 07/27/15 1010  WBC 15.6* 16.6* 18.8* 19.5* 16.8*  HGB 12.1 11.5* 11.1* 9.9* 10.7*  HCT 37.6 35.6* 34.5* 30.6* 33.6*  MCV 84.1 82.8 83.5 83.4 84.4  PLT 454* 455* 473* 420* 325   Cardiac Enzymes: No results for  input(s): CKTOTAL, CKMB, CKMBINDEX, TROPONINI in the last 168 hours. BNP: BNP (last 3 results) No results for input(s): BNP in the last 8760 hours.  ProBNP (last 3 results) No results for input(s): PROBNP in the last 8760 hours.  CBG: No results for input(s): GLUCAP in the last 168 hours.     Signed:  Louellen Molder MD.  [facility] 07/29/2015, 3:20 PM

## 2015-07-30 ENCOUNTER — Other Ambulatory Visit: Payer: Self-pay | Admitting: *Deleted

## 2015-07-30 ENCOUNTER — Other Ambulatory Visit: Payer: Medicaid Other

## 2015-07-30 ENCOUNTER — Encounter: Payer: Medicaid Other | Admitting: Genetic Counselor

## 2015-07-30 ENCOUNTER — Encounter: Payer: Self-pay | Admitting: Hematology and Oncology

## 2015-07-30 DIAGNOSIS — C50412 Malignant neoplasm of upper-outer quadrant of left female breast: Secondary | ICD-10-CM

## 2015-07-30 NOTE — Progress Notes (Signed)
Contacted patient per referral from [facility] concerning financial assistance. Asked patient for source of income and amounts. Patient gave me the information over the phone. I will meet with the patient when she comes in on Thursday 08/01/2015 in treatment. Asked patient to bring proof of income and bills at that time. Patient agreed and looks forward to meeting me on Thursday.

## 2015-07-31 ENCOUNTER — Other Ambulatory Visit: Payer: Self-pay | Admitting: *Deleted

## 2015-07-31 ENCOUNTER — Telehealth: Payer: Self-pay | Admitting: Hematology and Oncology

## 2015-07-31 DIAGNOSIS — C50412 Malignant neoplasm of upper-outer quadrant of left female breast: Secondary | ICD-10-CM

## 2015-07-31 NOTE — Telephone Encounter (Signed)
Referral noted for palliative care,scheduling does not set this up and i have left a message for dawn that the desk nurse of navig. Call for this.

## 2015-07-31 NOTE — Assessment & Plan Note (Signed)
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 Patient will need her abdomen drained at least every other day. 2. Patient does not have supplies. We are contacting social worker to help get her supplies. 3. If he cannot find supplies, she will then be drained with interventional radiology.  Lengthy discussion the hospital guarding goals of care. Patient wants to be aggressive and try to do chemotherapy as long as it can stop her abdomen getting full from ascites and as long as it can prolong her survival.   Palliative care consult Return to clinic in 2 weeks for cycle 2 of chemotherapy.

## 2015-08-01 ENCOUNTER — Encounter: Payer: Self-pay | Admitting: Hematology and Oncology

## 2015-08-01 ENCOUNTER — Encounter: Payer: Self-pay | Admitting: General Practice

## 2015-08-01 ENCOUNTER — Telehealth: Payer: Self-pay | Admitting: *Deleted

## 2015-08-01 ENCOUNTER — Encounter: Payer: Self-pay | Admitting: *Deleted

## 2015-08-01 ENCOUNTER — Ambulatory Visit (HOSPITAL_BASED_OUTPATIENT_CLINIC_OR_DEPARTMENT_OTHER): Payer: Medicaid Other | Admitting: Hematology and Oncology

## 2015-08-01 ENCOUNTER — Telehealth: Payer: Self-pay | Admitting: Hematology and Oncology

## 2015-08-01 ENCOUNTER — Other Ambulatory Visit (HOSPITAL_BASED_OUTPATIENT_CLINIC_OR_DEPARTMENT_OTHER): Payer: Medicaid Other

## 2015-08-01 ENCOUNTER — Ambulatory Visit (HOSPITAL_BASED_OUTPATIENT_CLINIC_OR_DEPARTMENT_OTHER): Payer: Medicaid Other

## 2015-08-01 VITALS — BP 94/64 | HR 107 | Temp 98.3°F | Resp 18 | Ht 62.0 in | Wt 155.0 lb

## 2015-08-01 DIAGNOSIS — R770 Abnormality of albumin: Secondary | ICD-10-CM | POA: Diagnosis not present

## 2015-08-01 DIAGNOSIS — R740 Nonspecific elevation of levels of transaminase and lactic acid dehydrogenase [LDH]: Secondary | ICD-10-CM

## 2015-08-01 DIAGNOSIS — R188 Other ascites: Secondary | ICD-10-CM

## 2015-08-01 DIAGNOSIS — R180 Malignant ascites: Secondary | ICD-10-CM

## 2015-08-01 DIAGNOSIS — R945 Abnormal results of liver function studies: Secondary | ICD-10-CM | POA: Diagnosis not present

## 2015-08-01 DIAGNOSIS — C50412 Malignant neoplasm of upper-outer quadrant of left female breast: Secondary | ICD-10-CM | POA: Diagnosis present

## 2015-08-01 DIAGNOSIS — Z5111 Encounter for antineoplastic chemotherapy: Secondary | ICD-10-CM

## 2015-08-01 DIAGNOSIS — C787 Secondary malignant neoplasm of liver and intrahepatic bile duct: Secondary | ICD-10-CM

## 2015-08-01 DIAGNOSIS — R600 Localized edema: Secondary | ICD-10-CM | POA: Diagnosis not present

## 2015-08-01 DIAGNOSIS — R7401 Elevation of levels of liver transaminase levels: Secondary | ICD-10-CM

## 2015-08-01 LAB — COMPREHENSIVE METABOLIC PANEL
ALBUMIN: 1.6 g/dL — AB (ref 3.5–5.0)
ALT: 95 U/L — ABNORMAL HIGH (ref 0–55)
AST: 247 U/L — AB (ref 5–34)
Alkaline Phosphatase: 402 U/L — ABNORMAL HIGH (ref 40–150)
Anion Gap: 8 mEq/L (ref 3–11)
BUN: 7.7 mg/dL (ref 7.0–26.0)
CO2: 30 meq/L — AB (ref 22–29)
Calcium: 8.5 mg/dL (ref 8.4–10.4)
Chloride: 92 mEq/L — ABNORMAL LOW (ref 98–109)
Creatinine: 0.6 mg/dL (ref 0.6–1.1)
GLUCOSE: 92 mg/dL (ref 70–140)
POTASSIUM: 5 meq/L (ref 3.5–5.1)
SODIUM: 131 meq/L — AB (ref 136–145)
TOTAL PROTEIN: 5.2 g/dL — AB (ref 6.4–8.3)
Total Bilirubin: 0.75 mg/dL (ref 0.20–1.20)

## 2015-08-01 LAB — CBC WITH DIFFERENTIAL/PLATELET
BASO%: 0.1 % (ref 0.0–2.0)
Basophils Absolute: 0 10*3/uL (ref 0.0–0.1)
EOS ABS: 0 10*3/uL (ref 0.0–0.5)
EOS%: 0.3 % (ref 0.0–7.0)
HCT: 27.9 % — ABNORMAL LOW (ref 34.8–46.6)
HEMOGLOBIN: 9 g/dL — AB (ref 11.6–15.9)
LYMPH%: 20 % (ref 14.0–49.7)
MCH: 26.6 pg (ref 25.1–34.0)
MCHC: 32.3 g/dL (ref 31.5–36.0)
MCV: 82.5 fL (ref 79.5–101.0)
MONO#: 0.5 10*3/uL (ref 0.1–0.9)
MONO%: 6 % (ref 0.0–14.0)
NEUT%: 73.6 % (ref 38.4–76.8)
NEUTROS ABS: 5.8 10*3/uL (ref 1.5–6.5)
Platelets: 165 10*3/uL (ref 145–400)
RBC: 3.38 10*6/uL — AB (ref 3.70–5.45)
RDW: 14.5 % (ref 11.2–14.5)
WBC: 7.9 10*3/uL (ref 3.9–10.3)
lymph#: 1.6 10*3/uL (ref 0.9–3.3)

## 2015-08-01 MED ORDER — SODIUM CHLORIDE 0.9 % IV SOLN
1000.0000 mg/m2 | Freq: Once | INTRAVENOUS | Status: AC
  Administered 2015-08-01: 1900 mg via INTRAVENOUS
  Filled 2015-08-01: qty 49.97

## 2015-08-01 MED ORDER — SODIUM CHLORIDE 0.9 % IJ SOLN
10.0000 mL | INTRAMUSCULAR | Status: DC | PRN
  Administered 2015-08-01: 10 mL
  Filled 2015-08-01: qty 10

## 2015-08-01 MED ORDER — SODIUM CHLORIDE 0.9 % IV SOLN
300.0000 mg | Freq: Once | INTRAVENOUS | Status: AC
  Administered 2015-08-01: 300 mg via INTRAVENOUS
  Filled 2015-08-01: qty 30

## 2015-08-01 MED ORDER — PALONOSETRON HCL INJECTION 0.25 MG/5ML
INTRAVENOUS | Status: AC
  Filled 2015-08-01: qty 5

## 2015-08-01 MED ORDER — HEPARIN SOD (PORK) LOCK FLUSH 100 UNIT/ML IV SOLN
500.0000 [IU] | Freq: Once | INTRAVENOUS | Status: AC | PRN
  Administered 2015-08-01: 500 [IU]
  Filled 2015-08-01: qty 5

## 2015-08-01 MED ORDER — SODIUM CHLORIDE 0.9 % IV SOLN
10.0000 mg | Freq: Once | INTRAVENOUS | Status: AC
  Administered 2015-08-01: 10 mg via INTRAVENOUS
  Filled 2015-08-01: qty 1

## 2015-08-01 MED ORDER — SODIUM CHLORIDE 0.9 % IV SOLN
Freq: Once | INTRAVENOUS | Status: AC
  Administered 2015-08-01: 09:00:00 via INTRAVENOUS

## 2015-08-01 MED ORDER — PALONOSETRON HCL INJECTION 0.25 MG/5ML
0.2500 mg | Freq: Once | INTRAVENOUS | Status: AC
  Administered 2015-08-01: 0.25 mg via INTRAVENOUS

## 2015-08-01 NOTE — Telephone Encounter (Signed)
Faxed order to [facility] to obtain specimen for cytology from pleurix drainage.

## 2015-08-01 NOTE — Telephone Encounter (Signed)
Appointments made and avs will be printed in chemo  °

## 2015-08-01 NOTE — Progress Notes (Signed)
Ok to treat per Dr. Gudena. 

## 2015-08-01 NOTE — Progress Notes (Signed)
[facility] Psychosocial Assessment Clinical Social Work  Clinical Social Work was referred by patient navigator, Futures trader and chaplain for support due to new stage IV breast cancer diagnosis.   Clinical Social Worker met with patient in the infusion room to assess psychosocial, emotional, mental health, and spiritual needs of the patient. CSW introduced self and explained role of CSW and Pt and Family Support team.   Patient's knowledge about cancer and its treatment including level of understanding, reactions, goals for care, and expectations: CSW and pt did not discuss this at length, as first meeting with pt. Pt appears to have good understanding of how advanced her cancer is and is open to additional supports and resources. CSW plans to revisit these concerns at future meeting with pt. Pt was open to [employer] and brochure on how to help her children cope.   Characteristics of the patient's support system: Pt resides with her husband, 2 1/2yo son, 75yo and 10yo. The school age children attend [employer]. She also reports to have a 36yo and 36yo that live nearby. She has an 36yo that is a Museum/gallery exhibitions officer at [employer] and lives on campus. She reports her husband has been providing her with care and support at home. She has a large extended family that is also providing additional support and help. She does not currently have concerns about transportation.   Patient and family psychosocial functioning including strengths, limitations, and coping skills: Pt is open to assistance and has strong support from family. She is very interested in [employer], support from the Pt and [employer], referral to [employer]. She is very interested in [place], yoga and massage to help cope. She has already applied for ss disability and is working to switch her MCD to the correct coverage. She thinks it is pregnancy MCD currently.   Identifications of barriers to care: Needs MCD switched, pt  to follow up on this as well.  There may be care needs at home that can be better addressed once medical coverage is in place.   Availability of community resources: Pt referred to many resources today for financial support and assistance. Pt plans to reach out to [employer] for support to her children.  She will consider other support options as well. CSW encouraged her to attend Living with Cancer group next week.   Clinical Social Worker follow up needed: Yes.    If yes, follow up plan: CSW to follow closely and assist accordingly.  Loren Racer, [place] Worker [place]  [place] Phone: [phone] Fax: [phone]

## 2015-08-01 NOTE — Progress Notes (Signed)
Met with patients spouse in lobby whom brought income verification documents. Made copies and gave originals back. Explained to him how the grant works. <et with patient in treatment introduced myself in person as Estate manager/land agent. Approved patient for $1000 [employer]. Copy of award and expense form given to patient. Gave patient referral form to [facility] to complete. Patient completed and gave back to me. Faxed form to [employer]. Fax received ok per confirmation sheet. Also gave patient applications for [employer] and [employer]. Patient has my card for any additional financial questions or concerns.

## 2015-08-01 NOTE — Patient Instructions (Signed)
[facility] Discharge Instructions for Patients Receiving Chemotherapy  Today you received the following chemotherapy agents: Gemzar and Carboplatin   To help prevent nausea and vomiting after your treatment, we encourage you to take your nausea medication as directed. Do not take zofran for the next 3 days    If you develop nausea and vomiting that is not controlled by your nausea medication, call the clinic.   BELOW ARE SYMPTOMS THAT SHOULD BE REPORTED IMMEDIATELY:  *FEVER GREATER THAN 100.5 F  *CHILLS WITH OR WITHOUT FEVER  NAUSEA AND VOMITING THAT IS NOT CONTROLLED WITH YOUR NAUSEA MEDICATION  *UNUSUAL SHORTNESS OF BREATH  *UNUSUAL BRUISING OR BLEEDING  TENDERNESS IN MOUTH AND THROAT WITH OR WITHOUT PRESENCE OF ULCERS  *URINARY PROBLEMS  *BOWEL PROBLEMS  UNUSUAL RASH Items with * indicate a potential emergency and should be followed up as soon as possible.  Feel free to call the clinic you have any questions or concerns. The clinic phone number is [phone].  Please show the [place] at check-in to the Emergency Department and triage nurse.    Gemcitabine injection What is this medicine? GEMCITABINE (jem SIT a been) is a chemotherapy drug. This medicine is used to treat many types of cancer like breast cancer, lung cancer, pancreatic cancer, and ovarian cancer. This medicine may be used for other purposes; ask your health care provider or pharmacist if you have questions. What should I tell my health care provider before I take this medicine? They need to know if you have any of these conditions: -blood disorders -infection -kidney disease -liver disease -recent or ongoing radiation therapy -an unusual or allergic reaction to gemcitabine, other chemotherapy, other medicines, foods, dyes, or preservatives -pregnant or trying to get pregnant -breast-feeding How should I use this medicine? This drug is given as an infusion into a vein. It  is administered in a hospital or clinic by a specially trained health care professional. Talk to your pediatrician regarding the use of this medicine in children. Special care may be needed. Overdosage: If you think you have taken too much of this medicine contact a poison control center or emergency room at once. NOTE: This medicine is only for you. Do not share this medicine with others. What if I miss a dose? It is important not to miss your dose. Call your doctor or health care professional if you are unable to keep an appointment. What may interact with this medicine? -medicines to increase blood counts like filgrastim, pegfilgrastim, sargramostim -some other chemotherapy drugs like cisplatin -vaccines Talk to your doctor or health care professional before taking any of these medicines: -acetaminophen -aspirin -ibuprofen -ketoprofen -naproxen This list may not describe all possible interactions. Give your health care provider a list of all the medicines, herbs, non-prescription drugs, or dietary supplements you use. Also tell them if you smoke, drink alcohol, or use illegal drugs. Some items may interact with your medicine. What should I watch for while using this medicine? Visit your doctor for checks on your progress. This drug may make you feel generally unwell. This is not uncommon, as chemotherapy can affect healthy cells as well as cancer cells. Report any side effects. Continue your course of treatment even though you feel ill unless your doctor tells you to stop. In some cases, you may be given additional medicines to help with side effects. Follow all directions for their use. Call your doctor or health care professional for advice if you get a fever, chills  or sore throat, or other symptoms of a cold or flu. Do not treat yourself. This drug decreases your body's ability to fight infections. Try to avoid being around people who are sick. This medicine may increase your risk to  bruise or bleed. Call your doctor or health care professional if you notice any unusual bleeding. Be careful brushing and flossing your teeth or using a toothpick because you may get an infection or bleed more easily. If you have any dental work done, tell your dentist you are receiving this medicine. Avoid taking products that contain aspirin, acetaminophen, ibuprofen, naproxen, or ketoprofen unless instructed by your doctor. These medicines may hide a fever. Women should inform their doctor if they wish to become pregnant or think they might be pregnant. There is a potential for serious side effects to an unborn child. Talk to your health care professional or pharmacist for more information. Do not breast-feed an infant while taking this medicine. What side effects may I notice from receiving this medicine? Side effects that you should report to your doctor or health care professional as soon as possible: -allergic reactions like skin rash, itching or hives, swelling of the face, lips, or tongue -low blood counts - this medicine may decrease the number of white blood cells, red blood cells and platelets. You may be at increased risk for infections and bleeding. -signs of infection - fever or chills, cough, sore throat, pain or difficulty passing urine -signs of decreased platelets or bleeding - bruising, pinpoint red spots on the skin, black, tarry stools, blood in the urine -signs of decreased red blood cells - unusually weak or tired, fainting spells, lightheadedness -breathing problems -chest pain -mouth sores -nausea and vomiting -pain, swelling, redness at site where injected -pain, tingling, numbness in the hands or feet -stomach pain -swelling of ankles, feet, hands -unusual bleeding Side effects that usually do not require medical attention (report to your doctor or health care professional if they continue or are bothersome): -constipation -diarrhea -hair loss -loss of  appetite -stomach upset This list may not describe all possible side effects. Call your doctor for medical advice about side effects. You may report side effects to FDA at 1-800-FDA-1088. Where should I keep my medicine? This drug is given in a hospital or clinic and will not be stored at home. NOTE: This sheet is a summary. It may not cover all possible information. If you have questions about this medicine, talk to your doctor, pharmacist, or health care provider.    2016, Elsevier/Gold Standard. (2007-11-08 18:45:54)    Carboplatin injection What is this medicine? CARBOPLATIN (KAR boe pla tin) is a chemotherapy drug. It targets fast dividing cells, like cancer cells, and causes these cells to die. This medicine is used to treat ovarian cancer and many other cancers. This medicine may be used for other purposes; ask your health care provider or pharmacist if you have questions. What should I tell my health care provider before I take this medicine? They need to know if you have any of these conditions: -blood disorders -hearing problems -kidney disease -recent or ongoing radiation therapy -an unusual or allergic reaction to carboplatin, cisplatin, other chemotherapy, other medicines, foods, dyes, or preservatives -pregnant or trying to get pregnant -breast-feeding How should I use this medicine? This drug is usually given as an infusion into a vein. It is administered in a hospital or clinic by a specially trained health care professional. Talk to your pediatrician regarding the use of this medicine  in children. Special care may be needed. Overdosage: If you think you have taken too much of this medicine contact a poison control center or emergency room at once. NOTE: This medicine is only for you. Do not share this medicine with others. What if I miss a dose? It is important not to miss a dose. Call your doctor or health care professional if you are unable to keep an  appointment. What may interact with this medicine? -medicines for seizures -medicines to increase blood counts like filgrastim, pegfilgrastim, sargramostim -some antibiotics like amikacin, gentamicin, neomycin, streptomycin, tobramycin -vaccines Talk to your doctor or health care professional before taking any of these medicines: -acetaminophen -aspirin -ibuprofen -ketoprofen -naproxen This list may not describe all possible interactions. Give your health care provider a list of all the medicines, herbs, non-prescription drugs, or dietary supplements you use. Also tell them if you smoke, drink alcohol, or use illegal drugs. Some items may interact with your medicine. What should I watch for while using this medicine? Your condition will be monitored carefully while you are receiving this medicine. You will need important blood work done while you are taking this medicine. This drug may make you feel generally unwell. This is not uncommon, as chemotherapy can affect healthy cells as well as cancer cells. Report any side effects. Continue your course of treatment even though you feel ill unless your doctor tells you to stop. In some cases, you may be given additional medicines to help with side effects. Follow all directions for their use. Call your doctor or health care professional for advice if you get a fever, chills or sore throat, or other symptoms of a cold or flu. Do not treat yourself. This drug decreases your body's ability to fight infections. Try to avoid being around people who are sick. This medicine may increase your risk to bruise or bleed. Call your doctor or health care professional if you notice any unusual bleeding. Be careful brushing and flossing your teeth or using a toothpick because you may get an infection or bleed more easily. If you have any dental work done, tell your dentist you are receiving this medicine. Avoid taking products that contain aspirin, acetaminophen,  ibuprofen, naproxen, or ketoprofen unless instructed by your doctor. These medicines may hide a fever. Do not become pregnant while taking this medicine. Women should inform their doctor if they wish to become pregnant or think they might be pregnant. There is a potential for serious side effects to an unborn child. Talk to your health care professional or pharmacist for more information. Do not breast-feed an infant while taking this medicine. What side effects may I notice from receiving this medicine? Side effects that you should report to your doctor or health care professional as soon as possible: -allergic reactions like skin rash, itching or hives, swelling of the face, lips, or tongue -signs of infection - fever or chills, cough, sore throat, pain or difficulty passing urine -signs of decreased platelets or bleeding - bruising, pinpoint red spots on the skin, black, tarry stools, nosebleeds -signs of decreased red blood cells - unusually weak or tired, fainting spells, lightheadedness -breathing problems -changes in hearing -changes in vision -chest pain -high blood pressure -low blood counts - This drug may decrease the number of white blood cells, red blood cells and platelets. You may be at increased risk for infections and bleeding. -nausea and vomiting -pain, swelling, redness or irritation at the injection site -pain, tingling, numbness in the hands or  feet -problems with balance, talking, walking -trouble passing urine or change in the amount of urine Side effects that usually do not require medical attention (report to your doctor or health care professional if they continue or are bothersome): -hair loss -loss of appetite -metallic taste in the mouth or changes in taste This list may not describe all possible side effects. Call your doctor for medical advice about side effects. You may report side effects to FDA at 1-800-FDA-1088. Where should I keep my medicine? This drug  is given in a hospital or clinic and will not be stored at home. NOTE: This sheet is a summary. It may not cover all possible information. If you have questions about this medicine, talk to your doctor, pharmacist, or health care provider.    2016, Elsevier/Gold Standard. (2007-10-04 14:38:05)

## 2015-08-01 NOTE — Progress Notes (Signed)
Patient Care Team: Pcp Not In System as PCP - General Dawn Skates, MD as Consulting Physician (General Surgery) Dawn Lose, MD as Consulting Physician (Hematology and Oncology) Dawn Rudd, MD as Consulting Physician (Radiation Oncology)  DIAGNOSIS: Breast cancer of upper-outer quadrant of left female breast [facility])   Staging form: Breast, AJCC 7th Edition     Clinical stage from 07/17/2015: Stage IIIA (T3, N1, M0) - Unsigned       Staging comments: Staged at breast conference on 1.4.17  SUMMARY OF ONCOLOGIC HISTORY:   Breast cancer of upper-outer quadrant of left female breast ([place])   07/02/2015 Mammogram Left breast mass 5.3 x 5.2 x 2.7 cm at 1:30 position, multiple abnormal enlarged left axillary lymph nodes   07/09/2015 Initial Diagnosis Left breast biopsy 1:30: IDC grade 3, ER PR 0%, HER-2 negative ratio 1.65, Ki-67 90%; left axillary lymph node biopsy: IDC grade 3 completely replacing the lymph node ER PR 0%, HER-2 negative ratio 1.22 Ki-67 90%    CHIEF COMPLIANT: Cycle 1 day 8 carboplatin and gemcitabine  INTERVAL HISTORY: Dawn Haynes is a  35 year old with above-mentioned history of metastatic breast cancer with extensive liver metastases. Periodontal fluid initially did not show any malignant cells but we will repeat a repeat this again. She continues to have recurrent bursitis and now has a peritoneal catheter that she can drain at home with the help of home health nurse. We had a difficulty with obtaining supplies for her but the home health agency was able to provide her the supplies without any cost. She is able to manage her life reasonably well and is able to walk for short distances but needs a wheelchair for longer distances. She has not been able to do any chores at home.  REVIEW OF SYSTEMS:   Constitutional: Denies fevers, chills or abnormal weight loss Eyes: Denies blurriness of vision Ears, nose, mouth, throat, and face: Denies mucositis or sore throat Respiratory:  Denies cough, dyspnea or wheezes Cardiovascular: Denies palpitation, chest discomfort Gastrointestinal:   Abdominal distention with ascites Skin: Denies abnormal skin rashes Lymphatics: Denies new lymphadenopathy or easy bruising Neurological:Denies numbness, tingling or new weaknesses Behavioral/Psych: Mood is stable, no new changes  Extremities:  Bilateral lower extremity edema  All other systems were reviewed with the patient and are negative.  I have reviewed the past medical history, past surgical history, social history and family history with the patient and they are unchanged from previous note.  ALLERGIES:  has No Known Allergies.  MEDICATIONS:  Current Outpatient Prescriptions  Medication Sig Dispense Refill  . BORON PO Take 1 tablet by mouth 3 (three) times daily as needed (gas/flatulence).    . Cholecalciferol (VITAMIN D) 2000 units CAPS Take 2,000 Units by mouth daily.     . fluconazole (DIFLUCAN) 100 MG tablet Take 1 tablet (100 mg total) by mouth daily. 4 tablet 0  . ondansetron (ZOFRAN) 4 MG tablet Take 1 tablet (4 mg total) by mouth every 8 (eight) hours as needed for nausea or vomiting. 30 tablet 0  . oxyCODONE (OXY IR/ROXICODONE) 5 MG immediate release tablet Take 1 tablet (5 mg total) by mouth every 4 (four) hours as needed for moderate pain. 30 tablet 0  . Probiotic Product (SOLUBLE FIBER/PROBIOTICS PO) Take 1 tablet by mouth daily.     Marland Kitchen senna-docusate (SENOKOT-S) 8.6-50 MG tablet Take 2 tablets by mouth daily. 20 tablet 0  . UNABLE TO FIND Take 1 tablet by mouth daily. Herbal Supplement for-Liver support    .  vitamin B-12 (CYANOCOBALAMIN) 100 MCG tablet Take 100 mcg by mouth daily.     No current facility-administered medications for this visit.    PHYSICAL EXAMINATION: ECOG PERFORMANCE STATUS: 3 - Symptomatic, >50% confined to bed  Filed Vitals:   08/01/15 0856  BP: 94/64  Pulse: 107  Temp: 98.3 F (36.8 C)  Resp: 18   Filed Weights   08/01/15 0856    Weight: 155 lb (70.308 kg)    GENERAL:alert, no distress and comfortable SKIN: skin color, texture, turgor are normal, no rashes or significant lesions EYES: normal, Conjunctiva are pink and non-injected, sclera clear OROPHARYNX:no exudate, no erythema and lips, buccal mucosa, and tongue normal  NECK: supple, thyroid normal size, non-tender, without nodularity LYMPH:  no palpable lymphadenopathy in the cervical, axillary or inguinal LUNGS: clear to auscultation and percussion with normal breathing effort HEART: regular rate & rhythm and no murmurs and no lower extremity edema ABDOMEN: abdominal distention with ascites MUSCULOSKELETAL:no cyanosis of digits and no clubbing  NEURO: alert & oriented x 3 with fluent speech, no focal motor/sensory deficits EXTREMITIES: No lower extremity edema   LABORATORY DATA:  I have reviewed the data as listed   Chemistry      Component Value Date/Time   NA 131* 08/01/2015 0801   NA 127* 07/29/2015 0605   K 5.0 08/01/2015 0801   K 5.4* 07/29/2015 0605   CL 89* 07/29/2015 0605   CO2 30* 08/01/2015 0801   CO2 29 07/29/2015 0605   BUN 7.7 08/01/2015 0801   BUN 18 07/29/2015 0605   CREATININE 0.6 08/01/2015 0801   CREATININE 0.57 07/29/2015 0605      Component Value Date/Time   CALCIUM 8.5 08/01/2015 0801   CALCIUM 8.1* 07/29/2015 0605   ALKPHOS 402* 08/01/2015 0801   ALKPHOS 362* 07/29/2015 0605   AST 247* 08/01/2015 0801   AST 387* 07/29/2015 0605   ALT 95* 08/01/2015 0801   ALT 95* 07/29/2015 0605   BILITOT 0.75 08/01/2015 0801   BILITOT 1.9* 07/29/2015 0605       Lab Results  Component Value Date   WBC 7.9 08/01/2015   HGB 9.0* 08/01/2015   HCT 27.9* 08/01/2015   MCV 82.5 08/01/2015   PLT 165 08/01/2015   NEUTROABS 5.8 08/01/2015     ASSESSMENT & PLAN:  Breast cancer of upper-outer quadrant of left female breast ([facility]) 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, Ki-67 90%;   left axillary lymph node biopsy: IDC grade 3 completely replacing the lymph node ER PR 0%, HER-2 negative ratio 1.22 Ki-67 90% Left breast mass 5.3 x 5.2 x 2.7 cm at 1:30 position, multiple abnormal enlarged left axillary lymph nodes Stage IV with liver involvement and recurrent malignant ascites  Treatment plan: Palliative chemotherapy with carboplatin and gemcitabine started 07/24/2015 days 1 and 8 every 3 weeks ------------------------------------------------------------------------------------------------------------------------------------------------------------ Current treatment: Carboplatin and gemcitabine cycle 1 day 8  Chemotherapy toxicities: Patient denies any nausea or vomiting or any bowel issues related to chemotherapy. Her blood counts specially her CBC appeared to be reasonably steady. 1. Normocytic anemia: Probably related to chemotherapy as well as anemia of chronic disease. We will have to monitor closely.  Recurrent malignant ascites: 1. Patient will need her abdomen drained at least every other day. Patient's home healthcare was able to provide her the supplies free of charge as charity care.  Elevated liver function tests: Related to extensive liver metastases. AST levels are slowly coming down.  We will have to watch her very closely.  Alkaline phosphatase and total bilirubin is still continuing to remain elevated.  Severe hypoalbuminemia: Patient started a protein drink yesterday. I will ask our dietitian to sit down and talk to her.  Lower extremity edema: Related to hypoalbuminemia.  Lengthy discussion the hospital guarding goals of care. Patient wants to be aggressive and try to do chemotherapy as long as it can stop her abdomen getting full from ascites and as long as it can prolong her survival.  Palliative care consult  Return to clinic in 2 weeks for cycle 2 of chemotherapy.   No orders of the defined types were placed in this encounter.   The patient  has a good understanding of the overall plan. she agrees with it. she will call with any problems that may develop before the next visit here.   Rulon Eisenmenger, MD 08/01/2015

## 2015-08-01 NOTE — Progress Notes (Signed)
Spiritual Care Note  Dawn Haynes in infusion room, but missed more detailed contact because I deferred to SW.  Plan to f/u by phone.  Pt also aware of ongoing chaplain availability, but please page as needs arise/circumstances change.  Thank you.  [place], [place], [facility] Pager [phone] Voicemail  [phone]

## 2015-08-01 NOTE — Progress Notes (Signed)
Per Beverlee Nims RN per Dr. [name] okay to treat.

## 2015-08-02 ENCOUNTER — Emergency Department (HOSPITAL_COMMUNITY)
Admission: EM | Admit: 2015-08-02 | Discharge: 2015-08-03 | Disposition: A | Discharge status: Home / Self Care | Payer: Medicaid Other | Attending: Emergency Medicine | Admitting: Emergency Medicine

## 2015-08-02 ENCOUNTER — Encounter (HOSPITAL_COMMUNITY): Payer: Self-pay | Admitting: *Deleted

## 2015-08-02 DIAGNOSIS — K5903 Drug induced constipation: Secondary | ICD-10-CM | POA: Diagnosis not present

## 2015-08-02 DIAGNOSIS — Z8619 Personal history of other infectious and parasitic diseases: Secondary | ICD-10-CM | POA: Diagnosis not present

## 2015-08-02 DIAGNOSIS — K5900 Constipation, unspecified: Secondary | ICD-10-CM | POA: Diagnosis present

## 2015-08-02 DIAGNOSIS — C50412 Malignant neoplasm of upper-outer quadrant of left female breast: Secondary | ICD-10-CM | POA: Insufficient documentation

## 2015-08-02 DIAGNOSIS — Z8739 Personal history of other diseases of the musculoskeletal system and connective tissue: Secondary | ICD-10-CM | POA: Diagnosis not present

## 2015-08-02 DIAGNOSIS — D649 Anemia, unspecified: Secondary | ICD-10-CM

## 2015-08-02 DIAGNOSIS — T402X5A Adverse effect of other opioids, initial encounter: Secondary | ICD-10-CM | POA: Insufficient documentation

## 2015-08-02 DIAGNOSIS — D696 Thrombocytopenia, unspecified: Secondary | ICD-10-CM | POA: Diagnosis not present

## 2015-08-02 DIAGNOSIS — Z79899 Other long term (current) drug therapy: Secondary | ICD-10-CM | POA: Insufficient documentation

## 2015-08-02 MED ORDER — SODIUM CHLORIDE 0.9 % IV SOLN
INTRAVENOUS | Status: DC
  Administered 2015-08-02: 125 mL/h via INTRAVENOUS

## 2015-08-02 MED ORDER — HYDROMORPHONE HCL 1 MG/ML IJ SOLN
1.0000 mg | Freq: Once | INTRAMUSCULAR | Status: AC
  Administered 2015-08-02: 1 mg via INTRAVENOUS
  Filled 2015-08-02: qty 1

## 2015-08-02 MED ORDER — ONDANSETRON HCL 4 MG/2ML IJ SOLN
4.0000 mg | Freq: Once | INTRAMUSCULAR | Status: DC
  Filled 2015-08-02: qty 2

## 2015-08-02 MED ORDER — FLEET ENEMA 7-19 GM/118ML RE ENEM
1.0000 | ENEMA | Freq: Once | RECTAL | Status: AC
  Administered 2015-08-03: via RECTAL
  Filled 2015-08-02: qty 1

## 2015-08-02 MED ORDER — POLYETHYLENE GLYCOL 3350 17 G PO PACK
17.0000 g | PACK | Freq: Every day | ORAL | Status: DC
  Administered 2015-08-03: 17 g via ORAL
  Filled 2015-08-02: qty 1

## 2015-08-02 NOTE — ED Provider Notes (Signed)
CSN: [id number]     Arrival date & time 08/02/15  1820 History   First MD Initiated Contact with Patient 08/02/15 2232     Chief Complaint  Patient presents with  . Constipation     (Consider location/radiation/quality/duration/timing/severity/associated sxs/prior Treatment) HPI  Dawn Haynes is a 35 y.o. female who presents for evaluation of constipation, for 1 week. She was discharged from hospital 5 days ago after treatment for breast cancer complicated by ascites. She had a indwelling peritoneal catheter placed for intermittent drainage procedures. She had peritoneal drainage, today by nursing. She is scheduled to get it every other day. She denies fever, chills, nausea, vomiting, weakness or dizziness. She feels like she ate more than usual today, which may have caused her abdominal pain. She is taking Senokot, without relief of constipation. She chemotherapy yesterday. There are no other known modifying factors.    Past Medical History  Diagnosis Date  . Spina bifida ([place])   . Chlamydia   . Blood transfusion without reported diagnosis 2009    after postpartum hemorrhage  . Postpartum hemorrhage 2009    28 week fetal demise  . DDD (degenerative disc disease), lumbar   . Breast cancer of upper-outer quadrant of left female breast ([place]) 07/11/2015  . Breast cancer [facility])    Past Surgical History  Procedure Laterality Date  . Wisdom tooth extraction    . Induced abortion    . Dilation and curettage of uterus    . Cesarean section N/A 01/22/2013    Procedure: primary CESAREAN SECTION of baby boy at 2001;  Surgeon: Cheri Fowler, MD;  Location: [facility];  Service: Obstetrics;  Laterality: N/A;  . Cholecystectomy  Aug 2000  . Portacath placement Right 07/24/2015    Procedure: INSERTION PORT-A-CATH RIGHT SUBCLAVIAN;  Surgeon: [name], MD;  Location: [facility];  Service: General;  Laterality: Right;   Family History  Problem Relation Age of Onset  . Diabetes Maternal Grandmother    . Lung cancer Paternal Grandfather    Social History  Substance Use Topics  . Smoking status: Never Smoker   . Smokeless tobacco: Never Used  . Alcohol Use: No   OB History    Gravida Para Term Preterm AB TAB SAB Ectopic Multiple Living   15 7 4 3 7 1 6   6      Review of Systems  All other systems reviewed and are negative.     Allergies  Review of patient's allergies indicates no known allergies.  Home Medications   Prior to Admission medications   Medication Sig Start Date End Date Taking? Authorizing Provider  BORON PO Take 1 tablet by mouth 3 (three) times daily as needed (gas/flatulence).   Yes Historical Provider, MD  Cholecalciferol (VITAMIN D) 2000 units CAPS Take 2,000 Units by mouth daily.    Yes Historical Provider, MD  ondansetron (ZOFRAN) 4 MG tablet Take 1 tablet (4 mg total) by mouth every 8 (eight) hours as needed for nausea or vomiting. 07/29/15  Yes Nishant Dhungel, MD  oxyCODONE (OXY IR/ROXICODONE) 5 MG immediate release tablet Take 1 tablet (5 mg total) by mouth every 4 (four) hours as needed for moderate pain. 07/29/15  Yes Nishant Dhungel, MD  Probiotic Product (SOLUBLE FIBER/PROBIOTICS PO) Take 1 tablet by mouth daily.    Yes Historical Provider, MD  senna-docusate (SENOKOT-S) 8.6-50 MG tablet Take 2 tablets by mouth daily. 07/29/15  Yes Nishant Dhungel, MD  vitamin B-12 (CYANOCOBALAMIN) 100 MCG tablet Take 100 mcg by mouth  daily.   Yes Historical Provider, MD  fluconazole (DIFLUCAN) 100 MG tablet Take 1 tablet (100 mg total) by mouth daily. Patient not taking: Reported on 08/02/2015 07/30/15 08/02/15  Nishant Dhungel, MD   BP 101/71 mmHg  Pulse 118  Temp(Src) 99 F (37.2 C) (Oral)  Resp 18  SpO2 100%  LMP 07/26/2015 Physical Exam  Constitutional: She is oriented to person, place, and time. She appears well-developed and well-nourished.  HENT:  Head: Normocephalic and atraumatic.  Right Ear: External ear normal.  Left Ear: External ear normal.   Eyes: Conjunctivae and EOM are normal. Pupils are equal, round, and reactive to light.  Neck: Normal range of motion and phonation normal. Neck supple.  Cardiovascular: Normal rate, regular rhythm and normal heart sounds.   Pulmonary/Chest: Effort normal and breath sounds normal. She exhibits no bony tenderness.  Abdominal: Soft. She exhibits distension. She exhibits no mass. There is tenderness (diffuse, mild). There is no rebound and no guarding.  Musculoskeletal: Normal range of motion.  Neurological: She is alert and oriented to person, place, and time. No cranial nerve deficit or sensory deficit. She exhibits normal muscle tone. Coordination normal.  Skin: Skin is warm, dry and intact.  Psychiatric: She has a normal mood and affect. Her behavior is normal. Judgment and thought content normal.  Nursing note and vitals reviewed.   ED Course  Procedures (including critical care time) Medications  0.9 %  sodium chloride infusion (not administered)  HYDROmorphone (DILAUDID) injection 1 mg (not administered)  ondansetron (ZOFRAN) injection 4 mg (not administered)  polyethylene glycol (MIRALAX / GLYCOLAX) packet 17 g (not administered)  sodium phosphate (FLEET) 7-19 GM/118ML enema 1 enema (not administered)    Patient Vitals for the past 24 hrs:  BP Temp Temp src Pulse Resp SpO2  08/02/15 1830 101/71 mmHg 99 F (37.2 C) Oral 118 18 100 %    Treatment for constipation 0rdered, care to Dr. Roxanne Mins at [id number]    Labs Review Labs Reviewed - No data to display  Imaging Review No results found. I have personally reviewed and evaluated these images and lab results as part of my medical decision-making.   EKG Interpretation None      MDM   Final diagnoses:  Constipation, unspecified constipation type    Nonspecific constipation, without clinical evidence for systemic infection, or metabolic instability.  Nursing Notes Reviewed/ Care Coordinated Applicable Imaging  Reviewed Interpretation of Laboratory Data incorporated into ED treatment   Plan: as per oncoming provider team  Daleen Bo, MD 08/03/15 1314

## 2015-08-02 NOTE — ED Notes (Signed)
Pt reports constipation-LBM was Monday.  Pt reports she has been taking senokot without relief. Pt reports abd pain and feeling bloated "from the front to the back."

## 2015-08-02 NOTE — ED Notes (Signed)
Nurse drawing labs. 

## 2015-08-03 LAB — CBC WITH DIFFERENTIAL/PLATELET
BASOS ABS: 0 10*3/uL (ref 0.0–0.1)
BASOS PCT: 0 %
EOS ABS: 0 10*3/uL (ref 0.0–0.7)
Eosinophils Relative: 0 %
HCT: 31.6 % — ABNORMAL LOW (ref 36.0–46.0)
HEMOGLOBIN: 10.1 g/dL — AB (ref 12.0–15.0)
LYMPHS PCT: 17 %
Lymphs Abs: 0.9 10*3/uL (ref 0.7–4.0)
MCH: 26.3 pg (ref 26.0–34.0)
MCHC: 32 g/dL (ref 30.0–36.0)
MCV: 82.3 fL (ref 78.0–100.0)
Monocytes Absolute: 0.1 10*3/uL (ref 0.1–1.0)
Monocytes Relative: 1 %
NEUTROS PCT: 82 %
Neutro Abs: 4.5 10*3/uL (ref 1.7–7.7)
Platelets: 114 10*3/uL — ABNORMAL LOW (ref 150–400)
RBC: 3.84 MIL/uL — ABNORMAL LOW (ref 3.87–5.11)
RDW: 14.7 % (ref 11.5–15.5)
WBC: 5.5 10*3/uL (ref 4.0–10.5)

## 2015-08-03 LAB — BASIC METABOLIC PANEL
Anion gap: 10 (ref 5–15)
BUN: 10 mg/dL (ref 6–20)
CO2: 28 mmol/L (ref 22–32)
CREATININE: 0.44 mg/dL (ref 0.44–1.00)
Calcium: 7.9 mg/dL — ABNORMAL LOW (ref 8.9–10.3)
Chloride: 97 mmol/L — ABNORMAL LOW (ref 101–111)
GFR calc Af Amer: >60 mL/min (ref >60)
GLUCOSE: 92 mg/dL (ref 65–99)
POTASSIUM: 3.8 mmol/L (ref 3.5–5.1)
SODIUM: 135 mmol/L (ref 135–145)

## 2015-08-03 MED ORDER — HYDROMORPHONE HCL 1 MG/ML IJ SOLN
1.0000 mg | Freq: Once | INTRAMUSCULAR | Status: AC
  Administered 2015-08-03: 1 mg via INTRAVENOUS
  Filled 2015-08-03: qty 1

## 2015-08-03 MED ORDER — POLYETHYLENE GLYCOL 3350 17 G PO PACK: 17.0000 g | PACK | Freq: Every day | ORAL | Status: DC

## 2015-08-03 MED ORDER — HEPARIN SOD (PORK) LOCK FLUSH 100 UNIT/ML IV SOLN
500.0000 [IU] | Freq: Once | INTRAVENOUS | Status: AC
  Administered 2015-08-03: 500 [IU]
  Filled 2015-08-03: qty 5

## 2015-08-03 MED ORDER — MAGNESIUM CITRATE PO SOLN
1.0000 | Freq: Once | ORAL | Status: AC
  Administered 2015-08-03: 1 via ORAL
  Filled 2015-08-03: qty 296

## 2015-08-03 NOTE — Discharge Instructions (Signed)
Constipation, Adult Constipation is when a person has fewer than three bowel movements a week, has difficulty having a bowel movement, or has stools that are dry, hard, or larger than normal. As people grow older, constipation is more common. A low-fiber diet, not taking in enough fluids, and taking certain medicines may make constipation worse.  CAUSES   Certain medicines, such as antidepressants, pain medicine, iron supplements, antacids, and water pills.   Certain diseases, such as diabetes, irritable bowel syndrome (IBS), thyroid disease, or depression.   Not drinking enough water.   Not eating enough fiber-rich foods.   Stress or travel.   Lack of physical activity or exercise.   Ignoring the urge to have a bowel movement.   Using laxatives too much.  SIGNS AND SYMPTOMS   Having fewer than three bowel movements a week.   Straining to have a bowel movement.   Having stools that are hard, dry, or larger than normal.   Feeling full or bloated.   Pain in the lower abdomen.   Not feeling relief after having a bowel movement.  DIAGNOSIS  Your health care provider will take a medical history and perform a physical exam. Further testing may be done for severe constipation. Some tests may include:  A barium enema X-ray to examine your rectum, colon, and, sometimes, your small intestine.   A sigmoidoscopy to examine your lower colon.   A colonoscopy to examine your entire colon. TREATMENT  Treatment will depend on the severity of your constipation and what is causing it. Some dietary treatments include drinking more fluids and eating more fiber-rich foods. Lifestyle treatments may include regular exercise. If these diet and lifestyle recommendations do not help, your health care provider may recommend taking over-the-counter laxative medicines to help you have bowel movements. Prescription medicines may be prescribed if over-the-counter medicines do not work.    HOME CARE INSTRUCTIONS   Eat foods that have a lot of fiber, such as fruits, vegetables, whole grains, and beans.  Limit foods high in fat and processed sugars, such as french fries, hamburgers, cookies, candies, and soda.   A fiber supplement may be added to your diet if you cannot get enough fiber from foods.   Drink enough fluids to keep your urine clear or pale yellow.   Exercise regularly or as directed by your health care provider.   Go to the restroom when you have the urge to go. Do not hold it.   Only take over-the-counter or prescription medicines as directed by your health care provider. Do not take other medicines for constipation without talking to your health care provider first.  [place] IF:   You have bright red blood in your stool.   Your constipation lasts for more than 4 days or gets worse.   You have abdominal or rectal pain.   You have thin, pencil-like stools.   You have unexplained weight loss. MAKE SURE YOU:   Understand these instructions.  Will watch your condition.  Will get help right away if you are not doing well or get worse.   This information is not intended to replace advice given to you by your health care provider. Make sure you discuss any questions you have with your health care provider.   Document Released: 03/27/2004 Document Revised: 07/20/2014 Document Reviewed: 04/10/2013 Elsevier Interactive Patient Education 2016 Elsevier Inc.  Polyethylene Glycol powder What is this medicine? POLYETHYLENE GLYCOL 3350 (pol ee ETH i leen; GLYE col) powder is  a laxative used to treat constipation. It increases the amount of water in the stool. Bowel movements become easier and more frequent. This medicine may be used for other purposes; ask your health care provider or pharmacist if you have questions. What should I tell my health care provider before I take this medicine? They need to know if you have any of  these conditions: -a history of blockage of the stomach or intestine -current abdomen distension or pain -difficulty swallowing -diverticulitis, ulcerative colitis, or other chronic bowel disease -phenylketonuria -an unusual or allergic reaction to polyethylene glycol, other medicines, dyes, or preservatives -pregnant or trying to get pregnant -breast-feeding How should I use this medicine? Take this medicine by mouth. The bottle has a measuring cap that is marked with a line. Pour the powder into the cap up to the marked line (the dose is about 1 heaping tablespoon). Add the powder in the cap to a full glass (4 to 8 ounces or 120 to 240 ml) of water, juice, soda, coffee or tea. Mix the powder well. Drink the solution. Take exactly as directed. Do not take your medicine more often than directed. Talk to your pediatrician regarding the use of this medicine in children. Special care may be needed. Overdosage: If you think you have taken too much of this medicine contact a poison control center or emergency room at once. NOTE: This medicine is only for you. Do not share this medicine with others. What if I miss a dose? If you miss a dose, take it as soon as you can. If it is almost time for your next dose, take only that dose. Do not take double or extra doses. What may interact with this medicine? Interactions are not expected. This list may not describe all possible interactions. Give your health care provider a list of all the medicines, herbs, non-prescription drugs, or dietary supplements you use. Also tell them if you smoke, drink alcohol, or use illegal drugs. Some items may interact with your medicine. What should I watch for while using this medicine? Do not use for more than 2 weeks without advice from your doctor or health care professional. It can take 2 to 4 days to have a bowel movement and to experience improvement in constipation. See your health care professional for any changes in  bowel habits, including constipation, that are severe or last longer than three weeks. Always take this medicine with plenty of water. What side effects may I notice from receiving this medicine? Side effects that you should report to your doctor or health care professional as soon as possible: -diarrhea -difficulty breathing -itching of the skin, hives, or skin rash -severe bloating, pain, or distension of the stomach -vomiting Side effects that usually do not require medical attention (report to your doctor or health care professional if they continue or are bothersome): -bloating or gas -lower abdominal discomfort or cramps -nausea This list may not describe all possible side effects. Call your doctor for medical advice about side effects. You may report side effects to FDA at 1-800-FDA-1088. Where should I keep my medicine? Keep out of the reach of children. Store between 15 and 30 degrees C (59 and 86 degrees F). Throw away any unused medicine after the expiration date. NOTE: This sheet is a summary. It may not cover all possible information. If you have questions about this medicine, talk to your doctor, pharmacist, or health care provider.    2016, Elsevier/Gold Standard. (2008-01-30 16:50:45)

## 2015-08-03 NOTE — ED Provider Notes (Signed)
Patient initially seen and evaluated by Dr. Eulis Foster for constipation. She received a fleets enema and states she did have a small amount of stool after that. She also received oral polyethylene glycol. She is on narcotic therapy and this appears to be the cause of her constipation. She is discharged with prescription for polyethylene glycol is also given a dose of magnesium citrate prior to discharge.   Delora Fuel, MD [phone]

## 2015-08-05 ENCOUNTER — Telehealth: Payer: Self-pay | Admitting: *Deleted

## 2015-08-05 NOTE — Telephone Encounter (Signed)
Received telephone advice record from [place] dated 08/02/15. Patient seen in ED for constipation. Called and reviewed with patient methods to prevent issues with further constipation to include fiber, fluids and taking Senokot and Miralax. She verbalized understanding.

## 2015-08-06 ENCOUNTER — Telehealth: Payer: Self-pay

## 2015-08-06 DIAGNOSIS — C787 Secondary malignant neoplasm of liver and intrahepatic bile duct: Secondary | ICD-10-CM

## 2015-08-06 DIAGNOSIS — R188 Other ascites: Secondary | ICD-10-CM

## 2015-08-06 DIAGNOSIS — C50412 Malignant neoplasm of upper-outer quadrant of left female breast: Secondary | ICD-10-CM

## 2015-08-06 MED ORDER — OXYCODONE HCL 5 MG PO TABS: 5.0000 mg | ORAL_TABLET | ORAL | Status: DC | PRN

## 2015-08-06 NOTE — Telephone Encounter (Signed)
Call rcvd from [place] with [facility] re: pt pleurx catheter.  She reports excessive amt of drainage at the pleurx catheter insertion site, "more than normally see with a Pleurx".  She reports pt husband is having to change the dressing frequently.    Writer spoke with [facility] - notified of pt symptoms.  IR will have their PA contact the patient and will follow up on this issue.   2nd call received from [facility] - pt has not had a bowel movement since Saturday.  Pt went to ED on Saturday for constipation (see ED notes in Epic).  Pt is taking senna/colace and miralax bid, has also tried prune juice.  Pt normal pattern is daily bowel movement.  Writer recommended increasing fluid intake, add fiber, magnesium citrate.  Writer advised Beth I would let Dr. [name] know.  Pt also requesting refill on pain medication.

## 2015-08-08 ENCOUNTER — Telehealth: Payer: Self-pay

## 2015-08-08 ENCOUNTER — Ambulatory Visit: Payer: Medicaid Other

## 2015-08-08 NOTE — Telephone Encounter (Signed)
Beth called requesting orders to follow up with patient.  [facility] orders run out after this weekend.  Verbal orders given to extend [facility] services on a weekly basis.  Patient will be unable to obtain Pleurx drain supplies without home health.  Beth reports patient had bowel movement the last two days, and that patient was able to drain this am without pain.  Beth also reports that patient is having problems with abdominal gas.    Let pt know that she should try simethicone for the gas, and if that does not resolve the problem she should call us back for further advice.  Let pt know that [facility] Patient Assistance Program form is ready for pickup.  Pt will need to complete and sign and send to the company.  Let pt know she would be continuing on home health on a weekly basis.  Pt voiced understanding.

## 2015-08-14 ENCOUNTER — Other Ambulatory Visit: Payer: Self-pay

## 2015-08-14 ENCOUNTER — Telehealth: Payer: Self-pay

## 2015-08-14 DIAGNOSIS — C50412 Malignant neoplasm of upper-outer quadrant of left female breast: Secondary | ICD-10-CM

## 2015-08-14 NOTE — Telephone Encounter (Signed)
Pt called concerned because there was no fluid in her Pleurx tubing.  Pt reports they drained 150 last night, 500 Sunday and 500 Friday.  Advised patient either the tube is clogged or the fluid production in her abdomen has decreased.  Pt reports she knows signs and symptoms of fluid accumulation and will call if she experience them and does not get fluid from the catheter.  Pt voiced understanding and confirmed she is in tomorrow for chemo.    Routed to MD.

## 2015-08-14 NOTE — Assessment & Plan Note (Signed)
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 Her blood counts specially her CBC appeared to be reasonably steady. 1. Normocytic anemia: Probably related to chemotherapy as well as anemia of chronic disease. We will have to monitor closely.  Recurrent malignant ascites: 1. Patient will need her abdomen drained at least every other day. Patient's home healthcare was able to provide her the supplies free of charge as charity care.  Elevated liver function tests: Related to extensive liver metastases. AST levels are slowly coming down. We will have to watch her very closely. Alkaline phosphatase and total bilirubin is still continuing to remain elevated.  Severe hypoalbuminemia: Patient started a protein drink yesterday. I will ask our dietitian to sit down and talk to her.  Lower extremity edema: Related to hypoalbuminemia.  Lengthy discussion the hospital guarding goals of care. Patient wants to be aggressive and try to do chemotherapy as long as it can stop her abdomen getting full from ascites and as long as it can prolong her survival.  Palliative care consult  Return to clinic in 3 weeks for cycle 3  of chemotherapy.

## 2015-08-15 ENCOUNTER — Encounter: Payer: Self-pay | Admitting: Hematology and Oncology

## 2015-08-15 ENCOUNTER — Ambulatory Visit (HOSPITAL_BASED_OUTPATIENT_CLINIC_OR_DEPARTMENT_OTHER): Payer: Medicaid Other | Admitting: Hematology and Oncology

## 2015-08-15 ENCOUNTER — Encounter: Payer: Self-pay | Admitting: *Deleted

## 2015-08-15 ENCOUNTER — Ambulatory Visit (HOSPITAL_BASED_OUTPATIENT_CLINIC_OR_DEPARTMENT_OTHER): Payer: Medicaid Other

## 2015-08-15 ENCOUNTER — Other Ambulatory Visit (HOSPITAL_BASED_OUTPATIENT_CLINIC_OR_DEPARTMENT_OTHER): Payer: Medicaid Other

## 2015-08-15 VITALS — BP 115/71 | HR 97 | Temp 98.0°F | Resp 18 | Ht 62.0 in | Wt 136.8 lb

## 2015-08-15 DIAGNOSIS — C787 Secondary malignant neoplasm of liver and intrahepatic bile duct: Secondary | ICD-10-CM | POA: Diagnosis not present

## 2015-08-15 DIAGNOSIS — R770 Abnormality of albumin: Secondary | ICD-10-CM

## 2015-08-15 DIAGNOSIS — R740 Nonspecific elevation of levels of transaminase and lactic acid dehydrogenase [LDH]: Secondary | ICD-10-CM

## 2015-08-15 DIAGNOSIS — C773 Secondary and unspecified malignant neoplasm of axilla and upper limb lymph nodes: Secondary | ICD-10-CM

## 2015-08-15 DIAGNOSIS — Z5111 Encounter for antineoplastic chemotherapy: Secondary | ICD-10-CM

## 2015-08-15 DIAGNOSIS — C50412 Malignant neoplasm of upper-outer quadrant of left female breast: Secondary | ICD-10-CM

## 2015-08-15 DIAGNOSIS — R945 Abnormal results of liver function studies: Secondary | ICD-10-CM | POA: Diagnosis not present

## 2015-08-15 DIAGNOSIS — Z171 Estrogen receptor negative status [ER-]: Secondary | ICD-10-CM

## 2015-08-15 DIAGNOSIS — R180 Malignant ascites: Secondary | ICD-10-CM | POA: Diagnosis not present

## 2015-08-15 DIAGNOSIS — D649 Anemia, unspecified: Secondary | ICD-10-CM

## 2015-08-15 DIAGNOSIS — R7401 Elevation of levels of liver transaminase levels: Secondary | ICD-10-CM

## 2015-08-15 LAB — CBC WITH DIFFERENTIAL/PLATELET
BASO%: 0.6 % (ref 0.0–2.0)
Basophils Absolute: 0 10*3/uL (ref 0.0–0.1)
EOS%: 0.9 % (ref 0.0–7.0)
Eosinophils Absolute: 0.1 10*3/uL (ref 0.0–0.5)
HEMATOCRIT: 28.7 % — AB (ref 34.8–46.6)
HEMOGLOBIN: 8.6 g/dL — AB (ref 11.6–15.9)
LYMPH#: 3 10*3/uL (ref 0.9–3.3)
LYMPH%: 44.9 % (ref 14.0–49.7)
MCH: 26.2 pg (ref 25.1–34.0)
MCHC: 30 g/dL — ABNORMAL LOW (ref 31.5–36.0)
MCV: 87.5 fL (ref 79.5–101.0)
MONO#: 1.1 10*3/uL — AB (ref 0.1–0.9)
MONO%: 16.1 % — ABNORMAL HIGH (ref 0.0–14.0)
NEUT#: 2.5 10*3/uL (ref 1.5–6.5)
NEUT%: 37.5 % — AB (ref 38.4–76.8)
NRBC: 1 % — AB (ref 0–0)
Platelets: 982 10*3/uL — ABNORMAL HIGH (ref 145–400)
RBC: 3.28 10*6/uL — ABNORMAL LOW (ref 3.70–5.45)
RDW: 17.2 % — AB (ref 11.2–14.5)
WBC: 6.6 10*3/uL (ref 3.9–10.3)

## 2015-08-15 LAB — COMPREHENSIVE METABOLIC PANEL
ALBUMIN: 2.2 g/dL — AB (ref 3.5–5.0)
ALK PHOS: 318 U/L — AB (ref 40–150)
ALT: 48 U/L (ref 0–55)
AST: 70 U/L — AB (ref 5–34)
Anion Gap: 9 mEq/L (ref 3–11)
BILIRUBIN TOTAL: 0.4 mg/dL (ref 0.20–1.20)
BUN: 4.7 mg/dL — AB (ref 7.0–26.0)
CALCIUM: 9 mg/dL (ref 8.4–10.4)
CO2: 29 mEq/L (ref 22–29)
CREATININE: 0.6 mg/dL (ref 0.6–1.1)
Chloride: 103 mEq/L (ref 98–109)
EGFR: >90 mL/min/{1.73_m2} (ref >90)
GLUCOSE: 95 mg/dL (ref 70–140)
Potassium: 4.5 mEq/L (ref 3.5–5.1)
SODIUM: 141 meq/L (ref 136–145)
TOTAL PROTEIN: 6.4 g/dL (ref 6.4–8.3)

## 2015-08-15 LAB — TECHNOLOGIST REVIEW

## 2015-08-15 MED ORDER — PALONOSETRON HCL INJECTION 0.25 MG/5ML
INTRAVENOUS | Status: AC
  Filled 2015-08-15: qty 5

## 2015-08-15 MED ORDER — SODIUM CHLORIDE 0.9 % IV SOLN
10.0000 mg | Freq: Once | INTRAVENOUS | Status: AC
  Administered 2015-08-15: 10 mg via INTRAVENOUS
  Filled 2015-08-15: qty 1

## 2015-08-15 MED ORDER — SODIUM CHLORIDE 0.9 % IV SOLN
250.0000 mg | Freq: Once | INTRAVENOUS | Status: AC
  Administered 2015-08-15: 250 mg via INTRAVENOUS
  Filled 2015-08-15: qty 25

## 2015-08-15 MED ORDER — SODIUM CHLORIDE 0.9 % IV SOLN
Freq: Once | INTRAVENOUS | Status: AC
  Administered 2015-08-15: 10:00:00 via INTRAVENOUS

## 2015-08-15 MED ORDER — PALONOSETRON HCL INJECTION 0.25 MG/5ML
0.2500 mg | Freq: Once | INTRAVENOUS | Status: AC
  Administered 2015-08-15: 0.25 mg via INTRAVENOUS

## 2015-08-15 MED ORDER — SODIUM CHLORIDE 0.9 % IJ SOLN
10.0000 mL | INTRAMUSCULAR | Status: DC | PRN
  Administered 2015-08-15: 10 mL
  Filled 2015-08-15: qty 10

## 2015-08-15 MED ORDER — HEPARIN SOD (PORK) LOCK FLUSH 100 UNIT/ML IV SOLN
500.0000 [IU] | Freq: Once | INTRAVENOUS | Status: AC | PRN
  Administered 2015-08-15: 500 [IU]
  Filled 2015-08-15: qty 5

## 2015-08-15 MED ORDER — SODIUM CHLORIDE 0.9 % IV SOLN
1700.0000 mg | Freq: Once | INTRAVENOUS | Status: AC
  Administered 2015-08-15: 1700 mg via INTRAVENOUS
  Filled 2015-08-15: qty 44.71

## 2015-08-15 NOTE — Progress Notes (Signed)
.vgprog   Patient Care Team: Pcp Not In System as PCP - General [name], MD as Consulting Physician (General Surgery) Nicholas Lose, MD as Consulting Physician (Hematology and Oncology) Kyung Rudd, MD as Consulting Physician (Radiation Oncology)  DIAGNOSIS: Breast cancer of upper-outer quadrant of left female breast [name])   Staging form: Breast, AJCC 7th Edition     Clinical stage from 07/17/2015: Stage IIIA (T3, N1, M0) - Unsigned       Staging comments: Staged at breast conference on 1.4.17  SUMMARY OF ONCOLOGIC HISTORY:   Breast cancer of upper-outer quadrant of left female breast ([place])   07/02/2015 Mammogram Left breast mass 5.3 x 5.2 x 2.7 cm at 1:30 position, multiple abnormal enlarged left axillary lymph nodes   07/09/2015 Initial Diagnosis Left breast biopsy 1:30: IDC grade 3, ER PR 0%, HER-2 negative ratio 1.65, Ki-67 90%; left axillary lymph node biopsy: IDC grade 3 completely replacing the lymph node ER PR 0%, HER-2 negative ratio 1.22 Ki-67 90%   07/25/2015 -  Chemotherapy Palliative chemotherapy: Carboplatin and gemcitabine day 1 and day 8 every 3 weeks ( metastatic breast cancer with extensive liver metastases and malignant recurrent ascites)    CHIEF COMPLIANT: Marked improvement in ascites and the breast mass  INTERVAL HISTORY: Dawn Haynes is a 35 year old African-American lady with above-mentioned history of metastatic breast cancer with moderate ascites and extensive liver metastases who received first cycle of chemotherapy with carboplatin and gemcitabine and is here today to receive second cycle of chemotherapy. Since the last visit her abdomen is significantly improved. Over the last couple of days they had not required to drain her abdomen. Home health is coming to her house regularly. She reports that she has normal appetite and her weight is back to her normal self. The breast mass itself is markedly smaller. She denies any nausea or vomiting. She did have numbness  of her fingers for a few days which improved subsequently.  REVIEW OF SYSTEMS:   Constitutional: Denies fevers, chills or abnormal weight loss, alopecia Eyes: Denies blurriness of vision Ears, nose, mouth, throat, and face: Denies mucositis or sore throat Respiratory: Denies cough, dyspnea or wheezes Cardiovascular: Denies palpitation, chest discomfort Gastrointestinal:  Mild abdominal distention Skin: Denies abnormal skin rashes Lymphatics: Denies new lymphadenopathy or easy bruising Neurological:Denies numbness, tingling or new weaknesses Behavioral/Psych: Mood is stable, no new changes  Extremities: No lower extremity edema Breast: Smaller mass in the breast All other systems were reviewed with the patient and are negative.  I have reviewed the past medical history, past surgical history, social history and family history with the patient and they are unchanged from previous note.  ALLERGIES:  has No Known Allergies.  MEDICATIONS:  Current Outpatient Prescriptions  Medication Sig Dispense Refill  . BORON PO Take 1 tablet by mouth 3 (three) times daily as needed (gas/flatulence).    . Cholecalciferol (VITAMIN D) 2000 units CAPS Take 2,000 Units by mouth daily.     . ondansetron (ZOFRAN) 4 MG tablet Take 1 tablet (4 mg total) by mouth every 8 (eight) hours as needed for nausea or vomiting. 30 tablet 0  . oxyCODONE (OXY IR/ROXICODONE) 5 MG immediate release tablet Take 1 tablet (5 mg total) by mouth every 4 (four) hours as needed for moderate pain. 30 tablet 0  . polyethylene glycol (MIRALAX / GLYCOLAX) packet Take 17 g by mouth daily. May increase to 34 g daily if needed 30 each 0  . Probiotic Product (SOLUBLE FIBER/PROBIOTICS PO) Take 1  tablet by mouth daily.     Marland Kitchen senna-docusate (SENOKOT-S) 8.6-50 MG tablet Take 2 tablets by mouth daily. 20 tablet 0  . vitamin B-12 (CYANOCOBALAMIN) 100 MCG tablet Take 100 mcg by mouth daily.     No current facility-administered medications for  this visit.    PHYSICAL EXAMINATION: ECOG PERFORMANCE STATUS: 1 - Symptomatic but completely ambulatory  Filed Vitals:   08/15/15 0817  BP: 115/71  Pulse: 97  Temp: 98 F (36.7 C)  Resp: 18   Filed Weights   08/15/15 0817  Weight: 136 lb 12.8 oz (62.052 kg)    GENERAL:alert, no distress and comfortable SKIN: skin color, texture, turgor are normal, no rashes or significant lesions EYES: normal, Conjunctiva are pink and non-injected, sclera clear OROPHARYNX:no exudate, no erythema and lips, buccal mucosa, and tongue normal  NECK: supple, thyroid normal size, non-tender, without nodularity LYMPH:  no palpable lymphadenopathy in the cervical, axillary or inguinal LUNGS: clear to auscultation and percussion with normal breathing effort HEART: regular rate & rhythm and no murmurs and no lower extremity edema ABDOMEN:abdomen soft, non-tender and normal bowel sounds MUSCULOSKELETAL:no cyanosis of digits and no clubbing  NEURO: alert & oriented x 3 with fluent speech, no focal motor/sensory deficits EXTREMITIES: No lower extremity edema BREAST: left breast mass is smaller (exam performed in the presence of a chaperone)  LABORATORY DATA:  I have reviewed the data as listed   Chemistry      Component Value Date/Time   NA 135 08/02/2015 2242   NA 131* 08/01/2015 0801   K 3.8 08/02/2015 2242   K 5.0 08/01/2015 0801   CL 97* 08/02/2015 2242   CO2 28 08/02/2015 2242   CO2 30* 08/01/2015 0801   BUN 10 08/02/2015 2242   BUN 7.7 08/01/2015 0801   CREATININE 0.44 08/02/2015 2242   CREATININE 0.6 08/01/2015 0801      Component Value Date/Time   CALCIUM 7.9* 08/02/2015 2242   CALCIUM 8.5 08/01/2015 0801   ALKPHOS 402* 08/01/2015 0801   ALKPHOS 362* 07/29/2015 0605   AST 247* 08/01/2015 0801   AST 387* 07/29/2015 0605   ALT 95* 08/01/2015 0801   ALT 95* 07/29/2015 0605   BILITOT 0.75 08/01/2015 0801   BILITOT 1.9* 07/29/2015 0605       Lab Results  Component Value Date    WBC 5.5 08/02/2015   HGB 10.1* 08/02/2015   HCT 31.6* 08/02/2015   MCV 82.3 08/02/2015   PLT 114* 08/02/2015   NEUTROABS 4.5 08/02/2015     ASSESSMENT & PLAN:  Breast cancer of upper-outer quadrant of left female breast ([facility]) 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, Ki-67 90%;  left axillary lymph node biopsy: IDC grade 3 completely replacing the lymph node ER PR 0%, HER-2 negative ratio 1.22 Ki-67 90% Left breast mass 5.3 x 5.2 x 2.7 cm at 1:30 position, multiple abnormal enlarged left axillary lymph nodes 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 Her blood counts specially her CBC appeared to be reasonably steady. 1. Normocytic anemia: Probably related to chemotherapy as well as anemia of chronic disease. We will have to monitor closely.  Recurrent malignant ascites: Patient's home healthcare was  able to provide her the supplies free of charge as charity care.  Patient is not draining as often.  Elevated liver function tests: Related to extensive liver metastases. AST levels are slowly coming down. We will have to watch her very closely. Alkaline phosphatase and total bilirubin have normalized  Severe hypoalbuminemia: Patient started a protein drink . our dietitian had met her.  Lower extremity edema: Related to hypoalbuminemia. improving Palliative care consult  Return to clinic in 3 weeks for cycle 3 of chemotherapy.  No orders of the defined types were placed in this  encounter.   The patient has a good understanding of the overall plan. she agrees with it. she will call with any problems that may develop before the next visit here.   Rulon Eisenmenger, MD 08/15/2015

## 2015-08-15 NOTE — Progress Notes (Signed)
gemzar=patient one poss asst/ drug replcment- no insurance- I will get patient to sign and get income info back to me

## 2015-08-15 NOTE — Patient Instructions (Signed)
[facility] Discharge Instructions for Patients Receiving Chemotherapy  Today you received the following chemotherapy agents: Gemzar and Carboplatin   To help prevent nausea and vomiting after your treatment, we encourage you to take your nausea medication as directed. Do not take zofran for the next 3 days    If you develop nausea and vomiting that is not controlled by your nausea medication, call the clinic.   BELOW ARE SYMPTOMS THAT SHOULD BE REPORTED IMMEDIATELY:  *FEVER GREATER THAN 100.5 F  *CHILLS WITH OR WITHOUT FEVER  NAUSEA AND VOMITING THAT IS NOT CONTROLLED WITH YOUR NAUSEA MEDICATION  *UNUSUAL SHORTNESS OF BREATH  *UNUSUAL BRUISING OR BLEEDING  TENDERNESS IN MOUTH AND THROAT WITH OR WITHOUT PRESENCE OF ULCERS  *URINARY PROBLEMS  *BOWEL PROBLEMS  UNUSUAL RASH Items with * indicate a potential emergency and should be followed up as soon as possible.  Feel free to call the clinic you have any questions or concerns. The clinic phone number is [phone].  Please show the [facility] at check-in to the Emergency Department and triage nurse.    Gemcitabine injection What is this medicine? GEMCITABINE (jem SIT a been) is a chemotherapy drug. This medicine is used to treat many types of cancer like breast cancer, lung cancer, pancreatic cancer, and ovarian cancer. This medicine may be used for other purposes; ask your health care provider or pharmacist if you have questions. What should I tell my health care provider before I take this medicine? They need to know if you have any of these conditions: -blood disorders -infection -kidney disease -liver disease -recent or ongoing radiation therapy -an unusual or allergic reaction to gemcitabine, other chemotherapy, other medicines, foods, dyes, or preservatives -pregnant or trying to get pregnant -breast-feeding How should I use this medicine? This drug is given as an infusion into a vein. It  is administered in a hospital or clinic by a specially trained health care professional. Talk to your pediatrician regarding the use of this medicine in children. Special care may be needed. Overdosage: If you think you have taken too much of this medicine contact a poison control center or emergency room at once. NOTE: This medicine is only for you. Do not share this medicine with others. What if I miss a dose? It is important not to miss your dose. Call your doctor or health care professional if you are unable to keep an appointment. What may interact with this medicine? -medicines to increase blood counts like filgrastim, pegfilgrastim, sargramostim -some other chemotherapy drugs like cisplatin -vaccines Talk to your doctor or health care professional before taking any of these medicines: -acetaminophen -aspirin -ibuprofen -ketoprofen -naproxen This list may not describe all possible interactions. Give your health care provider a list of all the medicines, herbs, non-prescription drugs, or dietary supplements you use. Also tell them if you smoke, drink alcohol, or use illegal drugs. Some items may interact with your medicine. What should I watch for while using this medicine? Visit your doctor for checks on your progress. This drug may make you feel generally unwell. This is not uncommon, as chemotherapy can affect healthy cells as well as cancer cells. Report any side effects. Continue your course of treatment even though you feel ill unless your doctor tells you to stop. In some cases, you may be given additional medicines to help with side effects. Follow all directions for their use. Call your doctor or health care professional for advice if you get a fever, chills  or sore throat, or other symptoms of a cold or flu. Do not treat yourself. This drug decreases your body's ability to fight infections. Try to avoid being around people who are sick. This medicine may increase your risk to  bruise or bleed. Call your doctor or health care professional if you notice any unusual bleeding. Be careful brushing and flossing your teeth or using a toothpick because you may get an infection or bleed more easily. If you have any dental work done, tell your dentist you are receiving this medicine. Avoid taking products that contain aspirin, acetaminophen, ibuprofen, naproxen, or ketoprofen unless instructed by your doctor. These medicines may hide a fever. Women should inform their doctor if they wish to become pregnant or think they might be pregnant. There is a potential for serious side effects to an unborn child. Talk to your health care professional or pharmacist for more information. Do not breast-feed an infant while taking this medicine. What side effects may I notice from receiving this medicine? Side effects that you should report to your doctor or health care professional as soon as possible: -allergic reactions like skin rash, itching or hives, swelling of the face, lips, or tongue -low blood counts - this medicine may decrease the number of white blood cells, red blood cells and platelets. You may be at increased risk for infections and bleeding. -signs of infection - fever or chills, cough, sore throat, pain or difficulty passing urine -signs of decreased platelets or bleeding - bruising, pinpoint red spots on the skin, black, tarry stools, blood in the urine -signs of decreased red blood cells - unusually weak or tired, fainting spells, lightheadedness -breathing problems -chest pain -mouth sores -nausea and vomiting -pain, swelling, redness at site where injected -pain, tingling, numbness in the hands or feet -stomach pain -swelling of ankles, feet, hands -unusual bleeding Side effects that usually do not require medical attention (report to your doctor or health care professional if they continue or are bothersome): -constipation -diarrhea -hair loss -loss of  appetite -stomach upset This list may not describe all possible side effects. Call your doctor for medical advice about side effects. You may report side effects to FDA at 1-800-FDA-1088. Where should I keep my medicine? This drug is given in a hospital or clinic and will not be stored at home. NOTE: This sheet is a summary. It may not cover all possible information. If you have questions about this medicine, talk to your doctor, pharmacist, or health care provider.    2016, Elsevier/Gold Standard. (2007-11-08 18:45:54)    Carboplatin injection What is this medicine? CARBOPLATIN (KAR boe pla tin) is a chemotherapy drug. It targets fast dividing cells, like cancer cells, and causes these cells to die. This medicine is used to treat ovarian cancer and many other cancers. This medicine may be used for other purposes; ask your health care provider or pharmacist if you have questions. What should I tell my health care provider before I take this medicine? They need to know if you have any of these conditions: -blood disorders -hearing problems -kidney disease -recent or ongoing radiation therapy -an unusual or allergic reaction to carboplatin, cisplatin, other chemotherapy, other medicines, foods, dyes, or preservatives -pregnant or trying to get pregnant -breast-feeding How should I use this medicine? This drug is usually given as an infusion into a vein. It is administered in a hospital or clinic by a specially trained health care professional. Talk to your pediatrician regarding the use of this medicine  in children. Special care may be needed. Overdosage: If you think you have taken too much of this medicine contact a poison control center or emergency room at once. NOTE: This medicine is only for you. Do not share this medicine with others. What if I miss a dose? It is important not to miss a dose. Call your doctor or health care professional if you are unable to keep an  appointment. What may interact with this medicine? -medicines for seizures -medicines to increase blood counts like filgrastim, pegfilgrastim, sargramostim -some antibiotics like amikacin, gentamicin, neomycin, streptomycin, tobramycin -vaccines Talk to your doctor or health care professional before taking any of these medicines: -acetaminophen -aspirin -ibuprofen -ketoprofen -naproxen This list may not describe all possible interactions. Give your health care provider a list of all the medicines, herbs, non-prescription drugs, or dietary supplements you use. Also tell them if you smoke, drink alcohol, or use illegal drugs. Some items may interact with your medicine. What should I watch for while using this medicine? Your condition will be monitored carefully while you are receiving this medicine. You will need important blood work done while you are taking this medicine. This drug may make you feel generally unwell. This is not uncommon, as chemotherapy can affect healthy cells as well as cancer cells. Report any side effects. Continue your course of treatment even though you feel ill unless your doctor tells you to stop. In some cases, you may be given additional medicines to help with side effects. Follow all directions for their use. Call your doctor or health care professional for advice if you get a fever, chills or sore throat, or other symptoms of a cold or flu. Do not treat yourself. This drug decreases your body's ability to fight infections. Try to avoid being around people who are sick. This medicine may increase your risk to bruise or bleed. Call your doctor or health care professional if you notice any unusual bleeding. Be careful brushing and flossing your teeth or using a toothpick because you may get an infection or bleed more easily. If you have any dental work done, tell your dentist you are receiving this medicine. Avoid taking products that contain aspirin, acetaminophen,  ibuprofen, naproxen, or ketoprofen unless instructed by your doctor. These medicines may hide a fever. Do not become pregnant while taking this medicine. Women should inform their doctor if they wish to become pregnant or think they might be pregnant. There is a potential for serious side effects to an unborn child. Talk to your health care professional or pharmacist for more information. Do not breast-feed an infant while taking this medicine. What side effects may I notice from receiving this medicine? Side effects that you should report to your doctor or health care professional as soon as possible: -allergic reactions like skin rash, itching or hives, swelling of the face, lips, or tongue -signs of infection - fever or chills, cough, sore throat, pain or difficulty passing urine -signs of decreased platelets or bleeding - bruising, pinpoint red spots on the skin, black, tarry stools, nosebleeds -signs of decreased red blood cells - unusually weak or tired, fainting spells, lightheadedness -breathing problems -changes in hearing -changes in vision -chest pain -high blood pressure -low blood counts - This drug may decrease the number of white blood cells, red blood cells and platelets. You may be at increased risk for infections and bleeding. -nausea and vomiting -pain, swelling, redness or irritation at the injection site -pain, tingling, numbness in the hands or  feet -problems with balance, talking, walking -trouble passing urine or change in the amount of urine Side effects that usually do not require medical attention (report to your doctor or health care professional if they continue or are bothersome): -hair loss -loss of appetite -metallic taste in the mouth or changes in taste This list may not describe all possible side effects. Call your doctor for medical advice about side effects. You may report side effects to FDA at 1-800-FDA-1088. Where should I keep my medicine? This drug  is given in a hospital or clinic and will not be stored at home. NOTE: This sheet is a summary. It may not cover all possible information. If you have questions about this medicine, talk to your doctor, pharmacist, or health care provider.    2016, Elsevier/Gold Standard. (2007-10-04 14:38:05)

## 2015-08-15 NOTE — Progress Notes (Signed)
OK to treat today with CBC,CMET results from today.  VO Dr. Hayden Rasmussen RN.  She will check on weight loss and carbo dosing and let us know.

## 2015-08-15 NOTE — Progress Notes (Signed)
Per patient she spoke with her case worker and she will be getting medicaid. No need for patient one app for gemzar.

## 2015-08-15 NOTE — Progress Notes (Signed)
**Note Dawn-Identified via Obfuscation** Dawn Haynes  Clinical Social Haynes was referred by [place] rounding for re-assessment of psychosocial needs.  Clinical Social Worker met with patient at [facility] in the infusion room to offer support and assess for needs.  Pt reports she is doing well and is feeling a ton better. She was in good spirits today and stated she was approved for SSI and should get her medicaid soon as well. She states she just talked with her case worker and should be approved for full medicaid coverage soon. Pt reports things are going well at home and her children are encouraged as she is feeling better. CSW encouraged pt to apply for Cancer care and educated her how to do so. Pt denied other concerns and agrees to reach out to CSW as needed.    Clinical Social Haynes interventions: Resource education Supportive listening  Loren Racer, [place] Worker [place]  [place] Phone: [phone] Fax: [phone]

## 2015-08-16 ENCOUNTER — Telehealth: Payer: Self-pay

## 2015-08-16 NOTE — Telephone Encounter (Signed)
Verbal order provided for weekly home visits for dressing changes and pleurx catheter supplies.  Routed to MD

## 2015-08-20 ENCOUNTER — Telehealth: Payer: Self-pay | Admitting: *Deleted

## 2015-08-20 ENCOUNTER — Telehealth: Payer: Self-pay | Admitting: Hematology and Oncology

## 2015-08-20 NOTE — Telephone Encounter (Signed)
Spoke with patient re next appointment for 2/9. Patient will get new schedule 2/9.

## 2015-08-20 NOTE — Telephone Encounter (Signed)
Patient called stating that she was having some "achy pain" around her pleurix drain. Called patient and she reports no pain at present. Advised to monitor for now but to call if anything changes. She verbalized understanding.

## 2015-08-22 ENCOUNTER — Other Ambulatory Visit: Payer: Self-pay | Admitting: *Deleted

## 2015-08-22 ENCOUNTER — Other Ambulatory Visit: Payer: Self-pay | Admitting: Hematology and Oncology

## 2015-08-22 ENCOUNTER — Other Ambulatory Visit (HOSPITAL_BASED_OUTPATIENT_CLINIC_OR_DEPARTMENT_OTHER): Payer: Medicaid Other

## 2015-08-22 ENCOUNTER — Ambulatory Visit (HOSPITAL_BASED_OUTPATIENT_CLINIC_OR_DEPARTMENT_OTHER): Payer: Medicaid Other

## 2015-08-22 VITALS — BP 124/74 | HR 78 | Temp 97.8°F | Resp 18

## 2015-08-22 DIAGNOSIS — Z5111 Encounter for antineoplastic chemotherapy: Secondary | ICD-10-CM | POA: Diagnosis not present

## 2015-08-22 DIAGNOSIS — C50412 Malignant neoplasm of upper-outer quadrant of left female breast: Secondary | ICD-10-CM

## 2015-08-22 DIAGNOSIS — R188 Other ascites: Secondary | ICD-10-CM

## 2015-08-22 DIAGNOSIS — C787 Secondary malignant neoplasm of liver and intrahepatic bile duct: Secondary | ICD-10-CM

## 2015-08-22 LAB — CBC WITH DIFFERENTIAL/PLATELET
BASO%: 0.8 % (ref 0.0–2.0)
Basophils Absolute: 0.1 10*3/uL (ref 0.0–0.1)
EOS ABS: 0 10*3/uL (ref 0.0–0.5)
EOS%: 0.5 % (ref 0.0–7.0)
HEMATOCRIT: 26.7 % — AB (ref 34.8–46.6)
HGB: 8.1 g/dL — ABNORMAL LOW (ref 11.6–15.9)
LYMPH#: 2.6 10*3/uL (ref 0.9–3.3)
LYMPH%: 41.8 % (ref 14.0–49.7)
MCH: 26.1 pg (ref 25.1–34.0)
MCHC: 30.3 g/dL — ABNORMAL LOW (ref 31.5–36.0)
MCV: 86.1 fL (ref 79.5–101.0)
MONO#: 0.7 10*3/uL (ref 0.1–0.9)
MONO%: 10.3 % (ref 0.0–14.0)
NEUT%: 46.6 % (ref 38.4–76.8)
NEUTROS ABS: 2.9 10*3/uL (ref 1.5–6.5)
PLATELETS: 495 10*3/uL — AB (ref 145–400)
RBC: 3.1 10*6/uL — ABNORMAL LOW (ref 3.70–5.45)
RDW: 17.3 % — ABNORMAL HIGH (ref 11.2–14.5)
WBC: 6.3 10*3/uL (ref 3.9–10.3)

## 2015-08-22 LAB — COMPREHENSIVE METABOLIC PANEL
ALBUMIN: 2.6 g/dL — AB (ref 3.5–5.0)
ALK PHOS: 316 U/L — AB (ref 40–150)
ALT: 53 U/L (ref 0–55)
ANION GAP: 10 meq/L (ref 3–11)
AST: 92 U/L — ABNORMAL HIGH (ref 5–34)
BILIRUBIN TOTAL: 0.43 mg/dL (ref 0.20–1.20)
BUN: 5.1 mg/dL — ABNORMAL LOW (ref 7.0–26.0)
CALCIUM: 9 mg/dL (ref 8.4–10.4)
CO2: 28 mEq/L (ref 22–29)
Chloride: 102 mEq/L (ref 98–109)
Creatinine: 0.6 mg/dL (ref 0.6–1.1)
Glucose: 94 mg/dl (ref 70–140)
Potassium: 3.9 mEq/L (ref 3.5–5.1)
Sodium: 140 mEq/L (ref 136–145)
TOTAL PROTEIN: 6.6 g/dL (ref 6.4–8.3)

## 2015-08-22 LAB — TECHNOLOGIST REVIEW: Technologist Review: 8

## 2015-08-22 MED ORDER — PALONOSETRON HCL INJECTION 0.25 MG/5ML
INTRAVENOUS | Status: AC
  Filled 2015-08-22: qty 5

## 2015-08-22 MED ORDER — SODIUM CHLORIDE 0.9 % IV SOLN
10.0000 mg | Freq: Once | INTRAVENOUS | Status: AC
  Administered 2015-08-22: 10 mg via INTRAVENOUS
  Filled 2015-08-22: qty 1

## 2015-08-22 MED ORDER — PALONOSETRON HCL INJECTION 0.25 MG/5ML
0.2500 mg | Freq: Once | INTRAVENOUS | Status: AC
  Administered 2015-08-22: 0.25 mg via INTRAVENOUS

## 2015-08-22 MED ORDER — SODIUM CHLORIDE 0.9 % IV SOLN
Freq: Once | INTRAVENOUS | Status: AC
  Administered 2015-08-22: 10:00:00 via INTRAVENOUS

## 2015-08-22 MED ORDER — SODIUM CHLORIDE 0.9 % IV SOLN
1700.0000 mg | Freq: Once | INTRAVENOUS | Status: AC
  Administered 2015-08-22: 1700 mg via INTRAVENOUS
  Filled 2015-08-22: qty 44.71

## 2015-08-22 MED ORDER — SODIUM CHLORIDE 0.9 % IJ SOLN
10.0000 mL | INTRAMUSCULAR | Status: DC | PRN
  Administered 2015-08-22: 10 mL
  Filled 2015-08-22: qty 10

## 2015-08-22 MED ORDER — OXYCODONE HCL 5 MG PO TABS: 5.0000 mg | ORAL_TABLET | ORAL | Status: DC | PRN

## 2015-08-22 MED ORDER — HEPARIN SOD (PORK) LOCK FLUSH 100 UNIT/ML IV SOLN
500.0000 [IU] | Freq: Once | INTRAVENOUS | Status: AC | PRN
  Administered 2015-08-22: 500 [IU]
  Filled 2015-08-22: qty 5

## 2015-08-22 MED ORDER — SODIUM CHLORIDE 0.9 % IV SOLN
250.0000 mg | Freq: Once | INTRAVENOUS | Status: AC
  Administered 2015-08-22: 250 mg via INTRAVENOUS
  Filled 2015-08-22: qty 25

## 2015-08-22 NOTE — Patient Instructions (Signed)
[facility] Discharge Instructions for Patients Receiving Chemotherapy  Today you received the following chemotherapy agents: Gemzar and Carboplatin   To help prevent nausea and vomiting after your treatment, we encourage you to take your nausea medication as directed. Do not take zofran for the next 3 days    If you develop nausea and vomiting that is not controlled by your nausea medication, call the clinic.   BELOW ARE SYMPTOMS THAT SHOULD BE REPORTED IMMEDIATELY:  *FEVER GREATER THAN 100.5 F  *CHILLS WITH OR WITHOUT FEVER  NAUSEA AND VOMITING THAT IS NOT CONTROLLED WITH YOUR NAUSEA MEDICATION  *UNUSUAL SHORTNESS OF BREATH  *UNUSUAL BRUISING OR BLEEDING  TENDERNESS IN MOUTH AND THROAT WITH OR WITHOUT PRESENCE OF ULCERS  *URINARY PROBLEMS  *BOWEL PROBLEMS  UNUSUAL RASH Items with * indicate a potential emergency and should be followed up as soon as possible.  Feel free to call the clinic you have any questions or concerns. The clinic phone number is [phone].  Please show the [place] at check-in to the Emergency Department and triage nurse.    Gemcitabine injection What is this medicine? GEMCITABINE (jem SIT a been) is a chemotherapy drug. This medicine is used to treat many types of cancer like breast cancer, lung cancer, pancreatic cancer, and ovarian cancer. This medicine may be used for other purposes; ask your health care provider or pharmacist if you have questions. What should I tell my health care provider before I take this medicine? They need to know if you have any of these conditions: -blood disorders -infection -kidney disease -liver disease -recent or ongoing radiation therapy -an unusual or allergic reaction to gemcitabine, other chemotherapy, other medicines, foods, dyes, or preservatives -pregnant or trying to get pregnant -breast-feeding How should I use this medicine? This drug is given as an infusion into a vein. It  is administered in a hospital or clinic by a specially trained health care professional. Talk to your pediatrician regarding the use of this medicine in children. Special care may be needed. Overdosage: If you think you have taken too much of this medicine contact a poison control center or emergency room at once. NOTE: This medicine is only for you. Do not share this medicine with others. What if I miss a dose? It is important not to miss your dose. Call your doctor or health care professional if you are unable to keep an appointment. What may interact with this medicine? -medicines to increase blood counts like filgrastim, pegfilgrastim, sargramostim -some other chemotherapy drugs like cisplatin -vaccines Talk to your doctor or health care professional before taking any of these medicines: -acetaminophen -aspirin -ibuprofen -ketoprofen -naproxen This list may not describe all possible interactions. Give your health care provider a list of all the medicines, herbs, non-prescription drugs, or dietary supplements you use. Also tell them if you smoke, drink alcohol, or use illegal drugs. Some items may interact with your medicine. What should I watch for while using this medicine? Visit your doctor for checks on your progress. This drug may make you feel generally unwell. This is not uncommon, as chemotherapy can affect healthy cells as well as cancer cells. Report any side effects. Continue your course of treatment even though you feel ill unless your doctor tells you to stop. In some cases, you may be given additional medicines to help with side effects. Follow all directions for their use. Call your doctor or health care professional for advice if you get a fever, chills  or sore throat, or other symptoms of a cold or flu. Do not treat yourself. This drug decreases your body's ability to fight infections. Try to avoid being around people who are sick. This medicine may increase your risk to  bruise or bleed. Call your doctor or health care professional if you notice any unusual bleeding. Be careful brushing and flossing your teeth or using a toothpick because you may get an infection or bleed more easily. If you have any dental work done, tell your dentist you are receiving this medicine. Avoid taking products that contain aspirin, acetaminophen, ibuprofen, naproxen, or ketoprofen unless instructed by your doctor. These medicines may hide a fever. Women should inform their doctor if they wish to become pregnant or think they might be pregnant. There is a potential for serious side effects to an unborn child. Talk to your health care professional or pharmacist for more information. Do not breast-feed an infant while taking this medicine. What side effects may I notice from receiving this medicine? Side effects that you should report to your doctor or health care professional as soon as possible: -allergic reactions like skin rash, itching or hives, swelling of the face, lips, or tongue -low blood counts - this medicine may decrease the number of white blood cells, red blood cells and platelets. You may be at increased risk for infections and bleeding. -signs of infection - fever or chills, cough, sore throat, pain or difficulty passing urine -signs of decreased platelets or bleeding - bruising, pinpoint red spots on the skin, black, tarry stools, blood in the urine -signs of decreased red blood cells - unusually weak or tired, fainting spells, lightheadedness -breathing problems -chest pain -mouth sores -nausea and vomiting -pain, swelling, redness at site where injected -pain, tingling, numbness in the hands or feet -stomach pain -swelling of ankles, feet, hands -unusual bleeding Side effects that usually do not require medical attention (report to your doctor or health care professional if they continue or are bothersome): -constipation -diarrhea -hair loss -loss of  appetite -stomach upset This list may not describe all possible side effects. Call your doctor for medical advice about side effects. You may report side effects to FDA at 1-800-FDA-1088. Where should I keep my medicine? This drug is given in a hospital or clinic and will not be stored at home. NOTE: This sheet is a summary. It may not cover all possible information. If you have questions about this medicine, talk to your doctor, pharmacist, or health care provider.    2016, Elsevier/Gold Standard. (2007-11-08 18:45:54)    Carboplatin injection What is this medicine? CARBOPLATIN (KAR boe pla tin) is a chemotherapy drug. It targets fast dividing cells, like cancer cells, and causes these cells to die. This medicine is used to treat ovarian cancer and many other cancers. This medicine may be used for other purposes; ask your health care provider or pharmacist if you have questions. What should I tell my health care provider before I take this medicine? They need to know if you have any of these conditions: -blood disorders -hearing problems -kidney disease -recent or ongoing radiation therapy -an unusual or allergic reaction to carboplatin, cisplatin, other chemotherapy, other medicines, foods, dyes, or preservatives -pregnant or trying to get pregnant -breast-feeding How should I use this medicine? This drug is usually given as an infusion into a vein. It is administered in a hospital or clinic by a specially trained health care professional. Talk to your pediatrician regarding the use of this medicine  in children. Special care may be needed. Overdosage: If you think you have taken too much of this medicine contact a poison control center or emergency room at once. NOTE: This medicine is only for you. Do not share this medicine with others. What if I miss a dose? It is important not to miss a dose. Call your doctor or health care professional if you are unable to keep an  appointment. What may interact with this medicine? -medicines for seizures -medicines to increase blood counts like filgrastim, pegfilgrastim, sargramostim -some antibiotics like amikacin, gentamicin, neomycin, streptomycin, tobramycin -vaccines Talk to your doctor or health care professional before taking any of these medicines: -acetaminophen -aspirin -ibuprofen -ketoprofen -naproxen This list may not describe all possible interactions. Give your health care provider a list of all the medicines, herbs, non-prescription drugs, or dietary supplements you use. Also tell them if you smoke, drink alcohol, or use illegal drugs. Some items may interact with your medicine. What should I watch for while using this medicine? Your condition will be monitored carefully while you are receiving this medicine. You will need important blood work done while you are taking this medicine. This drug may make you feel generally unwell. This is not uncommon, as chemotherapy can affect healthy cells as well as cancer cells. Report any side effects. Continue your course of treatment even though you feel ill unless your doctor tells you to stop. In some cases, you may be given additional medicines to help with side effects. Follow all directions for their use. Call your doctor or health care professional for advice if you get a fever, chills or sore throat, or other symptoms of a cold or flu. Do not treat yourself. This drug decreases your body's ability to fight infections. Try to avoid being around people who are sick. This medicine may increase your risk to bruise or bleed. Call your doctor or health care professional if you notice any unusual bleeding. Be careful brushing and flossing your teeth or using a toothpick because you may get an infection or bleed more easily. If you have any dental work done, tell your dentist you are receiving this medicine. Avoid taking products that contain aspirin, acetaminophen,  ibuprofen, naproxen, or ketoprofen unless instructed by your doctor. These medicines may hide a fever. Do not become pregnant while taking this medicine. Women should inform their doctor if they wish to become pregnant or think they might be pregnant. There is a potential for serious side effects to an unborn child. Talk to your health care professional or pharmacist for more information. Do not breast-feed an infant while taking this medicine. What side effects may I notice from receiving this medicine? Side effects that you should report to your doctor or health care professional as soon as possible: -allergic reactions like skin rash, itching or hives, swelling of the face, lips, or tongue -signs of infection - fever or chills, cough, sore throat, pain or difficulty passing urine -signs of decreased platelets or bleeding - bruising, pinpoint red spots on the skin, black, tarry stools, nosebleeds -signs of decreased red blood cells - unusually weak or tired, fainting spells, lightheadedness -breathing problems -changes in hearing -changes in vision -chest pain -high blood pressure -low blood counts - This drug may decrease the number of white blood cells, red blood cells and platelets. You may be at increased risk for infections and bleeding. -nausea and vomiting -pain, swelling, redness or irritation at the injection site -pain, tingling, numbness in the hands or  feet -problems with balance, talking, walking -trouble passing urine or change in the amount of urine Side effects that usually do not require medical attention (report to your doctor or health care professional if they continue or are bothersome): -hair loss -loss of appetite -metallic taste in the mouth or changes in taste This list may not describe all possible side effects. Call your doctor for medical advice about side effects. You may report side effects to FDA at 1-800-FDA-1088. Where should I keep my medicine? This drug  is given in a hospital or clinic and will not be stored at home. NOTE: This sheet is a summary. It may not cover all possible information. If you have questions about this medicine, talk to your doctor, pharmacist, or health care provider.    2016, Elsevier/Gold Standard. (2007-10-04 14:38:05)

## 2015-08-22 NOTE — Progress Notes (Signed)
Called Dr. [name] with lab work, AST 90. Okay to treat.

## 2015-08-23 ENCOUNTER — Telehealth: Payer: Self-pay | Admitting: *Deleted

## 2015-08-23 NOTE — Telephone Encounter (Signed)
Beth from [place] called asking if it was ok to write orders to continue to see patient as she has been certified for another 4-6 weeks. Discussed with Dr. [name] and relayed message to [facility] for [place] to see the patient.

## 2015-08-29 ENCOUNTER — Telehealth: Payer: Self-pay

## 2015-08-29 NOTE — Telephone Encounter (Signed)
Pt reports no menstrual period.  Also reports vaginal discharge, is concerned about possible beta strep infection she states was diagnosed end of last year at [facility].  Vaginal discharge is white, no itching, no burning, no odor, not copious.  Chart reviewed - unable to find test from 2016 with beta strep results.  Per Dr. [name], pt can try an OTC anti-fungal.    Let pt know Dr. Geralyn Flash advice.  Pt asked if she could see dentist for filling replacement.  Let pt know we would want to know if anything that would cause injury/bleeding were to be done.  Pt to have consult with dentist and will have dentist call or send letter for clearance prior to having any work done.  Pt voiced understanding.

## 2015-09-02 ENCOUNTER — Telehealth: Payer: Self-pay

## 2015-09-02 NOTE — Telephone Encounter (Signed)
Pt LM - pain.    Returned call to pt - pain at insertion site for pleurx catheter this morning, 8/10, sharp when she walked.  Pt took pain mediation, is now just an ache.  Dressing was changed yesterday.  Pt has not needed to drain the pleurx.  Pt denies redness, drainage, heat, swelling at site, denies temps.    Binnie Kand, RN with [facility] requesting home health RN check the site.  Beth returned call - she had spoken with patient and reports pt is no longer having pain.  She is scheduled to see pt tomorrow and pt reports she would rather just wait and see Beth in the am.

## 2015-09-04 NOTE — Assessment & Plan Note (Signed)
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 Her blood counts specially her CBC appeared to be reasonably steady. 1. Normocytic anemia: Probably related to chemotherapy as well as anemia of chronic disease. We will have to monitor closely.  Recurrent malignant ascites: Patient's home healthcare was able to provide her the supplies free of charge as charity care.  Patient is not draining as often.  Elevated liver function tests: Related to extensive liver metastases. AST levels are slowly coming down. We will have to watch her very closely. Alkaline phosphatase and total bilirubin have normalized  Severe hypoalbuminemia: Patient started a protein drink . our dietitian had met her.  Lower extremity edema: Related to hypoalbuminemia. improving Palliative care consult  Return to clinic in 3 weeks for cycle 4 of chemotherapy.

## 2015-09-05 ENCOUNTER — Ambulatory Visit (HOSPITAL_BASED_OUTPATIENT_CLINIC_OR_DEPARTMENT_OTHER): Payer: Medicaid Other | Admitting: Hematology and Oncology

## 2015-09-05 ENCOUNTER — Other Ambulatory Visit (HOSPITAL_BASED_OUTPATIENT_CLINIC_OR_DEPARTMENT_OTHER): Payer: Medicaid Other

## 2015-09-05 ENCOUNTER — Ambulatory Visit (HOSPITAL_BASED_OUTPATIENT_CLINIC_OR_DEPARTMENT_OTHER): Payer: Medicaid Other

## 2015-09-05 ENCOUNTER — Encounter: Payer: Self-pay | Admitting: Hematology and Oncology

## 2015-09-05 ENCOUNTER — Encounter: Payer: Self-pay | Admitting: *Deleted

## 2015-09-05 VITALS — BP 117/79 | HR 76 | Temp 97.9°F | Resp 18 | Wt 133.1 lb

## 2015-09-05 DIAGNOSIS — R188 Other ascites: Secondary | ICD-10-CM

## 2015-09-05 DIAGNOSIS — C787 Secondary malignant neoplasm of liver and intrahepatic bile duct: Secondary | ICD-10-CM

## 2015-09-05 DIAGNOSIS — C773 Secondary and unspecified malignant neoplasm of axilla and upper limb lymph nodes: Secondary | ICD-10-CM | POA: Diagnosis not present

## 2015-09-05 DIAGNOSIS — D649 Anemia, unspecified: Secondary | ICD-10-CM

## 2015-09-05 DIAGNOSIS — Z5111 Encounter for antineoplastic chemotherapy: Secondary | ICD-10-CM | POA: Diagnosis present

## 2015-09-05 DIAGNOSIS — C50412 Malignant neoplasm of upper-outer quadrant of left female breast: Secondary | ICD-10-CM

## 2015-09-05 DIAGNOSIS — Z171 Estrogen receptor negative status [ER-]: Secondary | ICD-10-CM | POA: Diagnosis not present

## 2015-09-05 DIAGNOSIS — R180 Malignant ascites: Secondary | ICD-10-CM | POA: Diagnosis not present

## 2015-09-05 DIAGNOSIS — R770 Abnormality of albumin: Secondary | ICD-10-CM | POA: Diagnosis not present

## 2015-09-05 LAB — CBC WITH DIFFERENTIAL/PLATELET
BASO%: 0.3 % (ref 0.0–2.0)
BASOS ABS: 0 10*3/uL (ref 0.0–0.1)
EOS ABS: 0.1 10*3/uL (ref 0.0–0.5)
EOS%: 1.1 % (ref 0.0–7.0)
HEMATOCRIT: 28.4 % — AB (ref 34.8–46.6)
HEMOGLOBIN: 8.8 g/dL — AB (ref 11.6–15.9)
LYMPH#: 1.7 10*3/uL (ref 0.9–3.3)
LYMPH%: 32.8 % (ref 14.0–49.7)
MCH: 26.7 pg (ref 25.1–34.0)
MCHC: 31.1 g/dL — ABNORMAL LOW (ref 31.5–36.0)
MCV: 85.9 fL (ref 79.5–101.0)
MONO#: 0.7 10*3/uL (ref 0.1–0.9)
MONO%: 13 % (ref 0.0–14.0)
NEUT#: 2.7 10*3/uL (ref 1.5–6.5)
NEUT%: 52.8 % (ref 38.4–76.8)
Platelets: 370 10*3/uL (ref 145–400)
RBC: 3.31 10*6/uL — ABNORMAL LOW (ref 3.70–5.45)
RDW: 21.1 % — AB (ref 11.2–14.5)
WBC: 5.1 10*3/uL (ref 3.9–10.3)

## 2015-09-05 LAB — COMPREHENSIVE METABOLIC PANEL
ALBUMIN: 3.4 g/dL — AB (ref 3.5–5.0)
ALK PHOS: 216 U/L — AB (ref 40–150)
ALT: 25 U/L (ref 0–55)
AST: 42 U/L — AB (ref 5–34)
Anion Gap: 11 mEq/L (ref 3–11)
BUN: 8 mg/dL (ref 7.0–26.0)
CALCIUM: 9.3 mg/dL (ref 8.4–10.4)
CO2: 26 mEq/L (ref 22–29)
CREATININE: 0.6 mg/dL (ref 0.6–1.1)
Chloride: 104 mEq/L (ref 98–109)
EGFR: >90 mL/min/{1.73_m2} (ref >90)
Glucose: 84 mg/dl (ref 70–140)
POTASSIUM: 3.7 meq/L (ref 3.5–5.1)
Sodium: 141 mEq/L (ref 136–145)
Total Bilirubin: 0.42 mg/dL (ref 0.20–1.20)
Total Protein: 7.7 g/dL (ref 6.4–8.3)

## 2015-09-05 MED ORDER — SODIUM CHLORIDE 0.9 % IJ SOLN
10.0000 mL | INTRAMUSCULAR | Status: DC | PRN
  Administered 2015-09-05: 10 mL
  Filled 2015-09-05: qty 10

## 2015-09-05 MED ORDER — PALONOSETRON HCL INJECTION 0.25 MG/5ML
INTRAVENOUS | Status: AC
  Filled 2015-09-05: qty 5

## 2015-09-05 MED ORDER — HEPARIN SOD (PORK) LOCK FLUSH 100 UNIT/ML IV SOLN
500.0000 [IU] | Freq: Once | INTRAVENOUS | Status: AC | PRN
  Administered 2015-09-05: 500 [IU]
  Filled 2015-09-05: qty 5

## 2015-09-05 MED ORDER — GEMCITABINE HCL CHEMO INJECTION 1 GM/26.3ML
1600.0000 mg | Freq: Once | INTRAVENOUS | Status: AC
  Administered 2015-09-05: 1600 mg via INTRAVENOUS
  Filled 2015-09-05: qty 42.08

## 2015-09-05 MED ORDER — SODIUM CHLORIDE 0.9 % IV SOLN
Freq: Once | INTRAVENOUS | Status: AC
  Administered 2015-09-05: 10:00:00 via INTRAVENOUS

## 2015-09-05 MED ORDER — CARBOPLATIN CHEMO INJECTION 450 MG/45ML
250.0000 mg | Freq: Once | INTRAVENOUS | Status: AC
  Administered 2015-09-05: 250 mg via INTRAVENOUS
  Filled 2015-09-05: qty 25

## 2015-09-05 MED ORDER — PALONOSETRON HCL INJECTION 0.25 MG/5ML
0.2500 mg | Freq: Once | INTRAVENOUS | Status: AC
  Administered 2015-09-05: 0.25 mg via INTRAVENOUS

## 2015-09-05 MED ORDER — SODIUM CHLORIDE 0.9 % IV SOLN
10.0000 mg | Freq: Once | INTRAVENOUS | Status: AC
  Administered 2015-09-05: 10 mg via INTRAVENOUS
  Filled 2015-09-05: qty 1

## 2015-09-05 NOTE — Progress Notes (Signed)
Unable to get in to exam room prior to MD.  No assessment performed.  

## 2015-09-05 NOTE — Patient Instructions (Signed)
[facility] Discharge Instructions for Patients Receiving Chemotherapy  Today you received the following chemotherapy agents carboplatin/gemzar  To help prevent nausea and vomiting after your treatment, we encourage you to take your nausea medication as directed   If you develop nausea and vomiting that is not controlled by your nausea medication, call the clinic.   BELOW ARE SYMPTOMS THAT SHOULD BE REPORTED IMMEDIATELY:  *FEVER GREATER THAN 100.5 F  *CHILLS WITH OR WITHOUT FEVER  NAUSEA AND VOMITING THAT IS NOT CONTROLLED WITH YOUR NAUSEA MEDICATION  *UNUSUAL SHORTNESS OF BREATH  *UNUSUAL BRUISING OR BLEEDING  TENDERNESS IN MOUTH AND THROAT WITH OR WITHOUT PRESENCE OF ULCERS  *URINARY PROBLEMS  *BOWEL PROBLEMS  UNUSUAL RASH Items with * indicate a potential emergency and should be followed up as soon as possible.  Feel free to call the clinic you have any questions or concerns. The clinic phone number is [phone].  

## 2015-09-05 NOTE — Addendum Note (Signed)
Addended by: Prentiss Bells on: 09/05/2015 02:51 PM   Modules accepted: Orders

## 2015-09-05 NOTE — Progress Notes (Signed)
Patient Care Team: Pcp Not In System as PCP - General [name], MD as Consulting Physician (General Surgery) Nicholas Lose, MD as Consulting Physician (Hematology and Oncology) Kyung Rudd, MD as Consulting Physician (Radiation Oncology)  DIAGNOSIS: Breast cancer of upper-outer quadrant of left female breast [facility])   Staging form: Breast, AJCC 7th Edition     Clinical stage from 07/17/2015: Stage IIIA (T3, N1, M0) - Unsigned       Staging comments: Staged at breast conference on 1.4.17  SUMMARY OF ONCOLOGIC HISTORY:   Breast cancer of upper-outer quadrant of left female breast ([place])   07/02/2015 Mammogram Left breast mass 5.3 x 5.2 x 2.7 cm at 1:30 position, multiple abnormal enlarged left axillary lymph nodes   07/09/2015 Initial Diagnosis Left breast biopsy 1:30: IDC grade 3, ER PR 0%, HER-2 negative ratio 1.65, Ki-67 90%; left axillary lymph node biopsy: IDC grade 3 completely replacing the lymph node ER PR 0%, HER-2 negative ratio 1.22 Ki-67 90%   07/25/2015 -  Chemotherapy Palliative chemotherapy: Carboplatin and gemcitabine day 1 and day 8 every 3 weeks ( metastatic breast cancer with extensive liver metastases and malignant recurrent ascites)    CHIEF COMPLIANT: Cycle 3 day 1 carboplatin and gemcitabine  INTERVAL HISTORY: Dawn Haynes is a 34 year old with above-mentioned history of metastatic breast cancer currently on palliative chemotherapy with carboplatin and gemcitabine. Today is cycle 3 day 1. She has tolerated chemotherapy extremely well. She did not have any nausea or vomiting. She does have moderate fatigue. The ascites has improved significantly to the point that we will request interventional radiology to see if they can remove the peritoneal catheter. She however still feels generally weak but able to do all of her activities of daily living without any limitations.   REVIEW OF SYSTEMS:   Constitutional: Denies fevers, chills or abnormal weight loss Eyes: Denies  blurriness of vision Ears, nose, mouth, throat, and face: Denies mucositis or sore throat Respiratory: Denies cough, dyspnea or wheezes Cardiovascular: Denies palpitation, chest discomfort Gastrointestinal:  Ascites has improved Skin: Denies abnormal skin rashes Lymphatics: Denies new lymphadenopathy or easy bruising Neurological:Denies numbness, tingling or new weaknesses Behavioral/Psych: Mood is stable, no new changes  Extremities: No lower extremity edema Breast:  denies any pain or lumps or nodules in either breasts All other systems were reviewed with the patient and are negative.  I have reviewed the past medical history, past surgical history, social history and family history with the patient and they are unchanged from previous note.  ALLERGIES:  has No Known Allergies.  MEDICATIONS:  Current Outpatient Prescriptions  Medication Sig Dispense Refill  . BORON PO Take 1 tablet by mouth 3 (three) times daily as needed (gas/flatulence).    . Cholecalciferol (VITAMIN D) 2000 units CAPS Take 2,000 Units by mouth daily.     . ondansetron (ZOFRAN) 4 MG tablet Take 1 tablet (4 mg total) by mouth every 8 (eight) hours as needed for nausea or vomiting. 30 tablet 0  . oxyCODONE (OXY IR/ROXICODONE) 5 MG immediate release tablet Take 1 tablet (5 mg total) by mouth every 4 (four) hours as needed for moderate pain. 60 tablet 0  . polyethylene glycol (MIRALAX / GLYCOLAX) packet Take 17 g by mouth daily. May increase to 34 g daily if needed 30 each 0  . Probiotic Product (SOLUBLE FIBER/PROBIOTICS PO) Take 1 tablet by mouth daily.     Marland Kitchen senna-docusate (SENOKOT-S) 8.6-50 MG tablet Take 2 tablets by mouth daily. 20 tablet 0  .  vitamin B-12 (CYANOCOBALAMIN) 100 MCG tablet Take 100 mcg by mouth daily.     No current facility-administered medications for this visit.    PHYSICAL EXAMINATION: ECOG PERFORMANCE STATUS: 1 - Symptomatic but completely ambulatory  Filed Vitals:   09/05/15 0901  BP:  117/79  Pulse: 76  Temp: 97.9 F (36.6 C)  Resp: 18   Filed Weights   09/05/15 0901  Weight: 133 lb 1.6 oz (60.374 kg)    GENERAL:alert, no distress and comfortable SKIN: skin color, texture, turgor are normal, no rashes or significant lesions EYES: normal, Conjunctiva are pink and non-injected, sclera clear OROPHARYNX:no exudate, no erythema and lips, buccal mucosa, and tongue normal  NECK: supple, thyroid normal size, non-tender, without nodularity LYMPH:  no palpable lymphadenopathy in the cervical, axillary or inguinal LUNGS: clear to auscultation and percussion with normal breathing effort HEART: regular rate & rhythm and no murmurs and no lower extremity edema ABDOMEN: Abdomen catheter is present in the right side of the abdomen. No palpable enlargement of liver or spleen. No ascites to palpation or percussion. MUSCULOSKELETAL:no cyanosis of digits and no clubbing  NEURO: alert & oriented x 3 with fluent speech, no focal motor/sensory deficits EXTREMITIES: No lower extremity edema  LABORATORY DATA:  I have reviewed the data as listed   Chemistry      Component Value Date/Time   NA 140 08/22/2015 0822   NA 135 08/02/2015 2242   K 3.9 08/22/2015 0822   K 3.8 08/02/2015 2242   CL 97* 08/02/2015 2242   CO2 28 08/22/2015 0822   CO2 28 08/02/2015 2242   BUN 5.1* 08/22/2015 0822   BUN 10 08/02/2015 2242   CREATININE 0.6 08/22/2015 0822   CREATININE 0.44 08/02/2015 2242      Component Value Date/Time   CALCIUM 9.0 08/22/2015 0822   CALCIUM 7.9* 08/02/2015 2242   ALKPHOS 316* 08/22/2015 0822   ALKPHOS 362* 07/29/2015 0605   AST 92* 08/22/2015 0822   AST 387* 07/29/2015 0605   ALT 53 08/22/2015 0822   ALT 95* 07/29/2015 0605   BILITOT 0.43 08/22/2015 0822   BILITOT 1.9* 07/29/2015 0605       Lab Results  Component Value Date   WBC 5.1 09/05/2015   HGB 8.8* 09/05/2015   HCT 28.4* 09/05/2015   MCV 85.9 09/05/2015   PLT 370 09/05/2015   NEUTROABS 2.7 09/05/2015      ASSESSMENT & PLAN:  Breast cancer of upper-outer quadrant of left female breast ([place]) 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, Ki-67 90%;  left axillary lymph node biopsy: IDC grade 3 completely replacing the lymph node ER PR 0%, HER-2 negative ratio 1.22 Ki-67 90% Left breast mass 5.3 x 5.2 x 2.7 cm at 1:30 position, multiple abnormal enlarged left axillary lymph nodes Stage IV with liver involvement and recurrent malignant ascites  Treatment plan: Palliative chemotherapy with carboplatin and gemcitabine started 07/24/2015 days 1 and 8 every 3 weeks ---------------------------------------------------------------------------------------------------------------------------------------------------------- Current treatment: Carboplatin and gemcitabine cycle 3 day 1  Chemotherapy toxicities: Patient denies any nausea or vomiting or any bowel issues related to chemotherapy. Her blood counts specially her CBC appeared to be reasonably steady. 1. Normocytic anemia: Probably related to chemotherapy as well as anemia of chronic disease. We will have to monitor closely.  Recurrent malignant ascites: I will request interventional radiology to remove the peritoneal catheter since she is not draining any fluid. Elevated liver function tests: Related to extensive liver  metastases. AST levels are slowly coming down. We will have to watch her very closely. Alkaline phosphatase and total bilirubin have normalized  Severe hypoalbuminemia: Patient started a protein drink . our dietitian had met her. Lower extremity edema: Related to hypoalbuminemia. improving Palliative care  Plan is to obtain CT of the chest abdomen pelvis with contrast prior to cycle 4 and follow-up review the results and decide whether we can continue with the same chemotherapy on the changes required. Based on the clinical improvement I suspect that the treatment is working and we might  plan to give her a total of 6 cycles of chemotherapy.  Return to clinic in 3 weeks for cycle 4 of chemotherapy.  Orders Placed This Encounter  Procedures  . CT Abdomen Pelvis W Contrast    Standing Status: Future     Number of Occurrences:      Standing Expiration Date: 09/04/2016    Order Specific Question:  If indicated for the ordered procedure, I authorize the administration of contrast media per Radiology protocol    Answer:  Yes    Order Specific Question:  Reason for Exam (SYMPTOM  OR DIAGNOSIS REQUIRED)    Answer:  Metastatic breast cancer restaging after 3 rounds of chemo    Order Specific Question:  Is the patient pregnant?    Answer:  No    Order Specific Question:  Preferred imaging location?    Answer:  [facility]  . CT Chest W Contrast    Standing Status: Future     Number of Occurrences:      Standing Expiration Date: 09/04/2016    Order Specific Question:  If indicated for the ordered procedure, I authorize the administration of contrast media per Radiology protocol    Answer:  Yes    Order Specific Question:  Reason for Exam (SYMPTOM  OR DIAGNOSIS REQUIRED)    Answer:  Restaging met breast cancer    Order Specific Question:  Is the patient pregnant?    Answer:  No    Order Specific Question:  Preferred imaging location?    Answer:  [facility]   The patient has a good understanding of the overall plan. she agrees with it. she will call with any problems that may develop before the next visit here.   Rulon Eisenmenger, MD 09/05/2015

## 2015-09-09 ENCOUNTER — Telehealth: Payer: Self-pay | Admitting: Hematology and Oncology

## 2015-09-09 NOTE — Telephone Encounter (Signed)
S/w pt, gave appt 3/16 + confirmed 3/2 appt.

## 2015-09-10 ENCOUNTER — Other Ambulatory Visit: Payer: Self-pay | Admitting: Radiology

## 2015-09-10 ENCOUNTER — Other Ambulatory Visit: Payer: Self-pay | Admitting: General Surgery

## 2015-09-11 ENCOUNTER — Ambulatory Visit (HOSPITAL_COMMUNITY)
Admission: RE | Admit: 2015-09-11 | Discharge: 2015-09-11 | Disposition: A | Discharge status: Home / Self Care | Payer: Medicaid Other | Source: Ambulatory Visit | Attending: Hematology and Oncology | Admitting: Hematology and Oncology

## 2015-09-11 ENCOUNTER — Ambulatory Visit (HOSPITAL_COMMUNITY): Admission: RE | Admit: 2015-09-11 | Payer: Medicaid Other | Source: Ambulatory Visit

## 2015-09-11 DIAGNOSIS — C50412 Malignant neoplasm of upper-outer quadrant of left female breast: Secondary | ICD-10-CM

## 2015-09-11 DIAGNOSIS — C787 Secondary malignant neoplasm of liver and intrahepatic bile duct: Secondary | ICD-10-CM

## 2015-09-11 DIAGNOSIS — R188 Other ascites: Secondary | ICD-10-CM

## 2015-09-11 DIAGNOSIS — Z4803 Encounter for change or removal of drains: Secondary | ICD-10-CM | POA: Insufficient documentation

## 2015-09-11 MED ORDER — LIDOCAINE HCL 1 % IJ SOLN
INTRAMUSCULAR | Status: AC
Start: 2015-09-11 — End: 2015-09-11
  Filled 2015-09-11: qty 20

## 2015-09-12 ENCOUNTER — Other Ambulatory Visit (HOSPITAL_BASED_OUTPATIENT_CLINIC_OR_DEPARTMENT_OTHER): Payer: Medicaid Other

## 2015-09-12 ENCOUNTER — Ambulatory Visit (HOSPITAL_BASED_OUTPATIENT_CLINIC_OR_DEPARTMENT_OTHER): Payer: Medicaid Other

## 2015-09-12 VITALS — BP 126/87 | HR 70 | Temp 98.3°F

## 2015-09-12 DIAGNOSIS — Z5111 Encounter for antineoplastic chemotherapy: Secondary | ICD-10-CM

## 2015-09-12 DIAGNOSIS — C50412 Malignant neoplasm of upper-outer quadrant of left female breast: Secondary | ICD-10-CM | POA: Diagnosis not present

## 2015-09-12 LAB — COMPREHENSIVE METABOLIC PANEL
ALBUMIN: 3.4 g/dL — AB (ref 3.5–5.0)
ALK PHOS: 202 U/L — AB (ref 40–150)
ALT: 31 U/L (ref 0–55)
AST: 48 U/L — ABNORMAL HIGH (ref 5–34)
Anion Gap: 9 mEq/L (ref 3–11)
BILIRUBIN TOTAL: 0.31 mg/dL (ref 0.20–1.20)
BUN: 6 mg/dL — ABNORMAL LOW (ref 7.0–26.0)
CO2: 27 mEq/L (ref 22–29)
CREATININE: 0.7 mg/dL (ref 0.6–1.1)
Calcium: 9.2 mg/dL (ref 8.4–10.4)
Chloride: 106 mEq/L (ref 98–109)
GLUCOSE: 86 mg/dL (ref 70–140)
Potassium: 3.6 mEq/L (ref 3.5–5.1)
SODIUM: 141 meq/L (ref 136–145)
TOTAL PROTEIN: 7.3 g/dL (ref 6.4–8.3)

## 2015-09-12 LAB — CBC WITH DIFFERENTIAL/PLATELET
BASO%: 0.6 % (ref 0.0–2.0)
Basophils Absolute: 0 10*3/uL (ref 0.0–0.1)
EOS ABS: 0 10*3/uL (ref 0.0–0.5)
EOS%: 0.6 % (ref 0.0–7.0)
HCT: 28.5 % — ABNORMAL LOW (ref 34.8–46.6)
HEMOGLOBIN: 8.6 g/dL — AB (ref 11.6–15.9)
LYMPH%: 63.8 % — ABNORMAL HIGH (ref 14.0–49.7)
MCH: 26.7 pg (ref 25.1–34.0)
MCHC: 30.2 g/dL — ABNORMAL LOW (ref 31.5–36.0)
MCV: 88.5 fL (ref 79.5–101.0)
MONO#: 0.3 10*3/uL (ref 0.1–0.9)
MONO%: 8.2 % (ref 0.0–14.0)
NEUT%: 26.8 % — ABNORMAL LOW (ref 38.4–76.8)
NEUTROS ABS: 1 10*3/uL — AB (ref 1.5–6.5)
PLATELETS: 450 10*3/uL — AB (ref 145–400)
RBC: 3.22 10*6/uL — ABNORMAL LOW (ref 3.70–5.45)
RDW: 19.8 % — AB (ref 11.2–14.5)
WBC: 3.5 10*3/uL — AB (ref 3.9–10.3)
lymph#: 2.3 10*3/uL (ref 0.9–3.3)

## 2015-09-12 LAB — TECHNOLOGIST REVIEW

## 2015-09-12 MED ORDER — SODIUM CHLORIDE 0.9 % IJ SOLN
10.0000 mL | INTRAMUSCULAR | Status: DC | PRN
  Administered 2015-09-12: 10 mL
  Filled 2015-09-12: qty 10

## 2015-09-12 MED ORDER — PALONOSETRON HCL INJECTION 0.25 MG/5ML
0.2500 mg | Freq: Once | INTRAVENOUS | Status: AC
  Administered 2015-09-12: 0.25 mg via INTRAVENOUS

## 2015-09-12 MED ORDER — PALONOSETRON HCL INJECTION 0.25 MG/5ML
INTRAVENOUS | Status: AC
Start: 2015-09-12 — End: 2015-09-12
  Filled 2015-09-12: qty 5

## 2015-09-12 MED ORDER — SODIUM CHLORIDE 0.9 % IV SOLN
10.0000 mg | Freq: Once | INTRAVENOUS | Status: AC
  Administered 2015-09-12: 10 mg via INTRAVENOUS
  Filled 2015-09-12: qty 1

## 2015-09-12 MED ORDER — SODIUM CHLORIDE 0.9 % IV SOLN
250.0000 mg | Freq: Once | INTRAVENOUS | Status: AC
  Administered 2015-09-12: 250 mg via INTRAVENOUS
  Filled 2015-09-12: qty 25

## 2015-09-12 MED ORDER — SODIUM CHLORIDE 0.9 % IV SOLN
1300.0000 mg | Freq: Once | INTRAVENOUS | Status: AC
  Administered 2015-09-12: 1300 mg via INTRAVENOUS
  Filled 2015-09-12: qty 34.19

## 2015-09-12 MED ORDER — HEPARIN SOD (PORK) LOCK FLUSH 100 UNIT/ML IV SOLN
500.0000 [IU] | Freq: Once | INTRAVENOUS | Status: AC | PRN
  Administered 2015-09-12: 500 [IU]
  Filled 2015-09-12: qty 5

## 2015-09-12 MED ORDER — SODIUM CHLORIDE 0.9 % IV SOLN
Freq: Once | INTRAVENOUS | Status: AC
  Administered 2015-09-12: 10:00:00 via INTRAVENOUS

## 2015-09-12 NOTE — Progress Notes (Signed)
Dr. [name] aware of [place] 1.0. Dr. [name] will be reducing dose.

## 2015-09-12 NOTE — Progress Notes (Signed)
Per Dr. [name], ok to treat with ANC 1.0.  Dr. [name] will reduce dose.  Pharmacy notified.

## 2015-09-12 NOTE — Patient Instructions (Signed)
[facility] Discharge Instructions for Patients Receiving Chemotherapy  Today you received the following chemotherapy agents Carboplatin and Gemzar.  To help prevent nausea and vomiting after your treatment, we encourage you to take your nausea medication as prescribed.   If you develop nausea and vomiting that is not controlled by your nausea medication, call the clinic.   BELOW ARE SYMPTOMS THAT SHOULD BE REPORTED IMMEDIATELY:  *FEVER GREATER THAN 100.5 F  *CHILLS WITH OR WITHOUT FEVER  NAUSEA AND VOMITING THAT IS NOT CONTROLLED WITH YOUR NAUSEA MEDICATION  *UNUSUAL SHORTNESS OF BREATH  *UNUSUAL BRUISING OR BLEEDING  TENDERNESS IN MOUTH AND THROAT WITH OR WITHOUT PRESENCE OF ULCERS  *URINARY PROBLEMS  *BOWEL PROBLEMS  UNUSUAL RASH Items with * indicate a potential emergency and should be followed up as soon as possible.  Feel free to call the clinic you have any questions or concerns. The clinic phone number is [phone].  Please show the CHEMO ALERT CARD at check-in to the Emergency Department and triage nurse.   

## 2015-09-19 ENCOUNTER — Encounter (HOSPITAL_COMMUNITY): Payer: Self-pay

## 2015-09-19 ENCOUNTER — Ambulatory Visit (HOSPITAL_COMMUNITY)
Admission: RE | Admit: 2015-09-19 | Discharge: 2015-09-19 | Disposition: A | Discharge status: Home / Self Care | Payer: Medicaid Other | Source: Ambulatory Visit | Attending: Hematology and Oncology | Admitting: Hematology and Oncology

## 2015-09-19 DIAGNOSIS — C787 Secondary malignant neoplasm of liver and intrahepatic bile duct: Secondary | ICD-10-CM | POA: Diagnosis not present

## 2015-09-19 DIAGNOSIS — C50412 Malignant neoplasm of upper-outer quadrant of left female breast: Secondary | ICD-10-CM | POA: Diagnosis present

## 2015-09-19 MED ORDER — IOHEXOL 300 MG/ML  SOLN
100.0000 mL | Freq: Once | INTRAMUSCULAR | Status: AC | PRN
  Administered 2015-09-19: 100 mL via INTRAVENOUS

## 2015-09-26 ENCOUNTER — Other Ambulatory Visit (HOSPITAL_BASED_OUTPATIENT_CLINIC_OR_DEPARTMENT_OTHER): Payer: Medicaid Other

## 2015-09-26 ENCOUNTER — Encounter: Payer: Self-pay | Admitting: Hematology and Oncology

## 2015-09-26 ENCOUNTER — Telehealth: Payer: Self-pay | Admitting: Hematology and Oncology

## 2015-09-26 ENCOUNTER — Ambulatory Visit (HOSPITAL_BASED_OUTPATIENT_CLINIC_OR_DEPARTMENT_OTHER): Payer: Medicaid Other | Admitting: Hematology and Oncology

## 2015-09-26 ENCOUNTER — Ambulatory Visit (HOSPITAL_BASED_OUTPATIENT_CLINIC_OR_DEPARTMENT_OTHER): Payer: Medicaid Other

## 2015-09-26 VITALS — BP 125/80 | HR 73 | Temp 98.2°F | Resp 18 | Ht 62.0 in | Wt 138.6 lb

## 2015-09-26 DIAGNOSIS — C50412 Malignant neoplasm of upper-outer quadrant of left female breast: Secondary | ICD-10-CM | POA: Diagnosis present

## 2015-09-26 DIAGNOSIS — D701 Agranulocytosis secondary to cancer chemotherapy: Secondary | ICD-10-CM

## 2015-09-26 DIAGNOSIS — C773 Secondary and unspecified malignant neoplasm of axilla and upper limb lymph nodes: Secondary | ICD-10-CM | POA: Diagnosis not present

## 2015-09-26 DIAGNOSIS — R770 Abnormality of albumin: Secondary | ICD-10-CM

## 2015-09-26 DIAGNOSIS — Z171 Estrogen receptor negative status [ER-]: Secondary | ICD-10-CM | POA: Diagnosis not present

## 2015-09-26 DIAGNOSIS — D6481 Anemia due to antineoplastic chemotherapy: Secondary | ICD-10-CM

## 2015-09-26 DIAGNOSIS — R180 Malignant ascites: Secondary | ICD-10-CM | POA: Diagnosis not present

## 2015-09-26 DIAGNOSIS — C787 Secondary malignant neoplasm of liver and intrahepatic bile duct: Secondary | ICD-10-CM | POA: Diagnosis not present

## 2015-09-26 DIAGNOSIS — Z5111 Encounter for antineoplastic chemotherapy: Secondary | ICD-10-CM | POA: Diagnosis not present

## 2015-09-26 LAB — COMPREHENSIVE METABOLIC PANEL
ALT: 30 U/L (ref 0–55)
AST: 46 U/L — AB (ref 5–34)
Albumin: 3.9 g/dL (ref 3.5–5.0)
Alkaline Phosphatase: 151 U/L — ABNORMAL HIGH (ref 40–150)
Anion Gap: 7 mEq/L (ref 3–11)
BUN: 6.9 mg/dL — AB (ref 7.0–26.0)
CALCIUM: 9.7 mg/dL (ref 8.4–10.4)
CHLORIDE: 107 meq/L (ref 98–109)
CO2: 28 meq/L (ref 22–29)
CREATININE: 0.7 mg/dL (ref 0.6–1.1)
EGFR: >90 mL/min/{1.73_m2} (ref >90)
Glucose: 77 mg/dl (ref 70–140)
Potassium: 4.5 mEq/L (ref 3.5–5.1)
Sodium: 141 mEq/L (ref 136–145)
Total Bilirubin: 0.41 mg/dL (ref 0.20–1.20)
Total Protein: 7.8 g/dL (ref 6.4–8.3)

## 2015-09-26 LAB — CBC WITH DIFFERENTIAL/PLATELET
BASO%: 0.3 % (ref 0.0–2.0)
Basophils Absolute: 0 10*3/uL (ref 0.0–0.1)
EOS%: 1.2 % (ref 0.0–7.0)
Eosinophils Absolute: 0 10*3/uL (ref 0.0–0.5)
HCT: 31.9 % — ABNORMAL LOW (ref 34.8–46.6)
HGB: 9.6 g/dL — ABNORMAL LOW (ref 11.6–15.9)
LYMPH#: 1.5 10*3/uL (ref 0.9–3.3)
LYMPH%: 45.2 % (ref 14.0–49.7)
MCH: 27.1 pg (ref 25.1–34.0)
MCHC: 30.1 g/dL — AB (ref 31.5–36.0)
MCV: 90.1 fL (ref 79.5–101.0)
MONO#: 0.7 10*3/uL (ref 0.1–0.9)
MONO%: 19.4 % — ABNORMAL HIGH (ref 0.0–14.0)
NEUT#: 1.2 10*3/uL — ABNORMAL LOW (ref 1.5–6.5)
NEUT%: 33.9 % — AB (ref 38.4–76.8)
PLATELETS: 247 10*3/uL (ref 145–400)
RBC: 3.54 10*6/uL — AB (ref 3.70–5.45)
RDW: 21.4 % — ABNORMAL HIGH (ref 11.2–14.5)
WBC: 3.4 10*3/uL — ABNORMAL LOW (ref 3.9–10.3)

## 2015-09-26 MED ORDER — SODIUM CHLORIDE 0.9 % IV SOLN
Freq: Once | INTRAVENOUS | Status: AC
  Administered 2015-09-26: 10:00:00 via INTRAVENOUS

## 2015-09-26 MED ORDER — CARBOPLATIN CHEMO INJECTION 450 MG/45ML
250.0000 mg | Freq: Once | INTRAVENOUS | Status: AC
  Administered 2015-09-26: 250 mg via INTRAVENOUS
  Filled 2015-09-26: qty 25

## 2015-09-26 MED ORDER — DEXAMETHASONE SODIUM PHOSPHATE 100 MG/10ML IJ SOLN
10.0000 mg | Freq: Once | INTRAMUSCULAR | Status: AC
  Administered 2015-09-26: 10 mg via INTRAVENOUS
  Filled 2015-09-26: qty 1

## 2015-09-26 MED ORDER — SODIUM CHLORIDE 0.9 % IV SOLN
1300.0000 mg | Freq: Once | INTRAVENOUS | Status: AC
  Administered 2015-09-26: 1300 mg via INTRAVENOUS
  Filled 2015-09-26: qty 34.19

## 2015-09-26 MED ORDER — PALONOSETRON HCL INJECTION 0.25 MG/5ML
0.2500 mg | Freq: Once | INTRAVENOUS | Status: AC
  Administered 2015-09-26: 0.25 mg via INTRAVENOUS

## 2015-09-26 MED ORDER — HEPARIN SOD (PORK) LOCK FLUSH 100 UNIT/ML IV SOLN
500.0000 [IU] | Freq: Once | INTRAVENOUS | Status: AC | PRN
  Administered 2015-09-26: 500 [IU]
  Filled 2015-09-26: qty 5

## 2015-09-26 MED ORDER — SODIUM CHLORIDE 0.9 % IJ SOLN
10.0000 mL | INTRAMUSCULAR | Status: DC | PRN
  Administered 2015-09-26: 10 mL
  Filled 2015-09-26: qty 10

## 2015-09-26 MED ORDER — PALONOSETRON HCL INJECTION 0.25 MG/5ML
INTRAVENOUS | Status: AC
  Filled 2015-09-26: qty 5

## 2015-09-26 NOTE — Assessment & Plan Note (Signed)
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 Her blood counts specially her CBC appeared to be reasonably steady. 1. Normocytic anemia: Probably related to chemotherapy as well as anemia of chronic disease. We will have to monitor closely.  Recurrent malignant ascites: Resolved. Peritoneal catheter has been removed. Elevated liver function tests: Related to extensive liver metastases. AST levels are slowly coming down. We will have to watch her very closely. Alkaline phosphatase and total bilirubin have normalized  Severe hypoalbuminemia: Patient started a protein drink . our dietitian had met her. Lower extremity edema: Related to hypoalbuminemia. improving Palliative care  CT chest abdomen pelvis 09/19/2015: Moderate improvement in metastatic disease. Liver lesions smaller (1.9 cm decreased to 8 mm, 7.1 cm lesion decreased to 3.2 cm, 5.4 cm lesion decreased to 2.5 cm)  Based on the good response by CT scan and clinical signs, plan to give her a total of 6 cycles of chemotherapy.  Return to clinic in 3 weeks for cycle 4 of chemotherapy.

## 2015-09-26 NOTE — Telephone Encounter (Signed)
appt made per 3/16 pof. avs printed

## 2015-09-26 NOTE — Patient Instructions (Signed)
[facility] Discharge Instructions for Patients Receiving Chemotherapy  Today you received the following chemotherapy agents Carboplatin and Gemzar.  To help prevent nausea and vomiting after your treatment, we encourage you to take your nausea medication as prescribed.   If you develop nausea and vomiting that is not controlled by your nausea medication, call the clinic.   BELOW ARE SYMPTOMS THAT SHOULD BE REPORTED IMMEDIATELY:  *FEVER GREATER THAN 100.5 F  *CHILLS WITH OR WITHOUT FEVER  NAUSEA AND VOMITING THAT IS NOT CONTROLLED WITH YOUR NAUSEA MEDICATION  *UNUSUAL SHORTNESS OF BREATH  *UNUSUAL BRUISING OR BLEEDING  TENDERNESS IN MOUTH AND THROAT WITH OR WITHOUT PRESENCE OF ULCERS  *URINARY PROBLEMS  *BOWEL PROBLEMS  UNUSUAL RASH Items with * indicate a potential emergency and should be followed up as soon as possible.  Feel free to call the clinic you have any questions or concerns. The clinic phone number is [phone].  Please show the CHEMO ALERT CARD at check-in to the Emergency Department and triage nurse.   

## 2015-09-26 NOTE — Progress Notes (Signed)
Patient Care Team: Pcp Not In System as PCP - General [name], MD as Consulting Physician (General Surgery) Nicholas Lose, MD as Consulting Physician (Hematology and Oncology) Kyung Rudd, MD as Consulting Physician (Radiation Oncology)  DIAGNOSIS: Breast cancer of upper-outer quadrant of left female breast [facility])   Staging form: Breast, AJCC 7th Edition     Clinical stage from 07/17/2015: Stage IIIA (T3, N1, M0) - Unsigned       Staging comments: Staged at breast conference on 1.4.17    SUMMARY OF ONCOLOGIC HISTORY:   Breast cancer of upper-outer quadrant of left female breast ([place])   07/02/2015 Mammogram Left breast mass 5.3 x 5.2 x 2.7 cm at 1:30 position, multiple abnormal enlarged left axillary lymph nodes   07/09/2015 Initial Diagnosis Left breast biopsy 1:30: IDC grade 3, ER PR 0%, HER-2 negative ratio 1.65, Ki-67 90%; left axillary lymph node biopsy: IDC grade 3 completely replacing the lymph node ER PR 0%, HER-2 negative ratio 1.22 Ki-67 90%   07/25/2015 -  Chemotherapy Palliative chemotherapy: Carboplatin and gemcitabine day 1 and day 8 every 3 weeks ( metastatic breast cancer with extensive liver metastases and malignant recurrent ascites)    CHIEF COMPLIANT: Cycle 4 carboplatin and gemcitabine  INTERVAL HISTORY: Dawn Haynes is a 35 year old with above-mentioned history of metastatic breast cancer who had received 3 cycles of chemotherapy and is here today to receive cycle 4 of treatment. She had a CT scan recently and is here to discuss the results. The scan reveals remarkable improvement in the liver metastases as well as her breast cancer. She had a peritoneal catheter for recurrent paracentesis which has now been removed. She no longer has any problems from it. Her abdominal symptoms have improved significantly.  REVIEW OF SYSTEMS:   Constitutional: Denies fevers, chills or abnormal weight loss Eyes: Denies blurriness of vision Ears, nose, mouth, throat, and face:  Denies mucositis or sore throat Respiratory: Denies cough, dyspnea or wheezes Cardiovascular: Denies palpitation, chest discomfort Gastrointestinal:  Denies nausea, heartburn or change in bowel habits Skin: Denies abnormal skin rashes Lymphatics: Denies new lymphadenopathy or easy bruising Neurological:Denies numbness, tingling or new weaknesses Behavioral/Psych: Mood is stable, no new changes  Extremities: No lower extremity edema Breast:  denies any pain or lumps or nodules in either breasts All other systems were reviewed with the patient and are negative.  I have reviewed the past medical history, past surgical history, social history and family history with the patient and they are unchanged from previous note.  ALLERGIES:  has No Known Allergies.  MEDICATIONS:  Current Outpatient Prescriptions  Medication Sig Dispense Refill  . BORON PO Take 1 tablet by mouth 3 (three) times daily as needed (gas/flatulence).    . Cholecalciferol (VITAMIN D) 2000 units CAPS Take 2,000 Units by mouth daily.     . ondansetron (ZOFRAN) 4 MG tablet Take 1 tablet (4 mg total) by mouth every 8 (eight) hours as needed for nausea or vomiting. 30 tablet 0  . oxyCODONE (OXY IR/ROXICODONE) 5 MG immediate release tablet Take 1 tablet (5 mg total) by mouth every 4 (four) hours as needed for moderate pain. 60 tablet 0  . polyethylene glycol (MIRALAX / GLYCOLAX) packet Take 17 g by mouth daily. May increase to 34 g daily if needed 30 each 0  . Probiotic Product (SOLUBLE FIBER/PROBIOTICS PO) Take 1 tablet by mouth daily.     Marland Kitchen senna-docusate (SENOKOT-S) 8.6-50 MG tablet Take 2 tablets by mouth daily. 20 tablet 0  .  vitamin B-12 (CYANOCOBALAMIN) 100 MCG tablet Take 100 mcg by mouth daily.     No current facility-administered medications for this visit.    PHYSICAL EXAMINATION: ECOG PERFORMANCE STATUS: 0 - Asymptomatic  Filed Vitals:   09/26/15 0853  BP: 125/80  Pulse: 73  Temp: 98.2 F (36.8 C)  Resp: 18     Filed Weights   09/26/15 0853  Weight: 138 lb 9.6 oz (62.869 kg)    GENERAL:alert, no distress and comfortable SKIN: skin color, texture, turgor are normal, no rashes or significant lesions EYES: normal, Conjunctiva are pink and non-injected, sclera clear OROPHARYNX:no exudate, no erythema and lips, buccal mucosa, and tongue normal  NECK: supple, thyroid normal size, non-tender, without nodularity LYMPH:  no palpable lymphadenopathy in the cervical, axillary or inguinal LUNGS: clear to auscultation and percussion with normal breathing effort HEART: regular rate & rhythm and no murmurs and no lower extremity edema ABDOMEN:abdomen soft, non-tender and normal bowel sounds MUSCULOSKELETAL:no cyanosis of digits and no clubbing  NEURO: alert & oriented x 3 with fluent speech, no focal motor/sensory deficits EXTREMITIES: No lower extremity edema LABORATORY DATA:  I have reviewed the data as listed   Chemistry      Component Value Date/Time   NA 141 09/26/2015 0841   NA 135 08/02/2015 2242   K 4.5 09/26/2015 0841   K 3.8 08/02/2015 2242   CL 97* 08/02/2015 2242   CO2 28 09/26/2015 0841   CO2 28 08/02/2015 2242   BUN 6.9* 09/26/2015 0841   BUN 10 08/02/2015 2242   CREATININE 0.7 09/26/2015 0841   CREATININE 0.44 08/02/2015 2242      Component Value Date/Time   CALCIUM 9.7 09/26/2015 0841   CALCIUM 7.9* 08/02/2015 2242   ALKPHOS 151* 09/26/2015 0841   ALKPHOS 362* 07/29/2015 0605   AST 46* 09/26/2015 0841   AST 387* 07/29/2015 0605   ALT 30 09/26/2015 0841   ALT 95* 07/29/2015 0605   BILITOT 0.41 09/26/2015 0841   BILITOT 1.9* 07/29/2015 0605       Lab Results  Component Value Date   WBC 3.4* 09/26/2015   HGB 9.6* 09/26/2015   HCT 31.9* 09/26/2015   MCV 90.1 09/26/2015   PLT 247 09/26/2015   NEUTROABS 1.2* 09/26/2015     ASSESSMENT & PLAN:  Breast cancer of upper-outer quadrant of left female breast ([facility]) Left breast biopsy 1:30 on 07/09/2015: IDC grade 3, ER  PR 0%, HER-2 negative ratio 1.65, Ki-67 90%;  left axillary lymph node biopsy: IDC grade 3 completely replacing the lymph node ER PR 0%, HER-2 negative ratio 1.22 Ki-67 90% Left breast mass 5.3 x 5.2 x 2.7 cm at 1:30 position, multiple abnormal enlarged left axillary lymph nodes 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 Her blood counts specially her CBC appeared to be reasonably steady. 1. Normocytic anemia: Probably related to chemotherapy as well as anemia of chronic disease. We will have to monitor closely. 2. Neutropenia: I will add a dose of Neupogen to be given 2 days prior to each chemotherapy  Recurrent malignant ascites: Resolved. Peritoneal catheter has been removed. Elevated liver function tests: Related to extensive liver metastases. AST levels are slowly coming down. We will have to watch her very  closely. Alkaline phosphatase and total bilirubin have normalized  Severe hypoalbuminemia: Patient started a protein drink . our dietitian had met her. Lower extremity edema: Related to hypoalbuminemia. improving Palliative care  CT chest abdomen pelvis 09/19/2015: Moderate improvement in metastatic disease. Liver lesions smaller (1.9 cm decreased to 8 mm, 7.1 cm lesion decreased to 3.2 cm, 5.4 cm lesion decreased to 2.5 cm)  Based on the good response by CT scan and clinical signs, plan to give her a total of 6 cycles of chemotherapy.  Return to clinic in 3 weeks for cycle 4 of chemotherapy.   No orders of the defined  types were placed in this encounter.   The patient has a good understanding of the overall plan. she agrees with it. she will call with any problems that may develop before the next visit here.   Rulon Eisenmenger, MD 09/26/2015

## 2015-09-27 ENCOUNTER — Encounter: Payer: Self-pay | Admitting: *Deleted

## 2015-10-01 ENCOUNTER — Ambulatory Visit (HOSPITAL_BASED_OUTPATIENT_CLINIC_OR_DEPARTMENT_OTHER): Payer: Medicaid Other

## 2015-10-01 VITALS — BP 118/78 | HR 57 | Temp 98.3°F

## 2015-10-01 DIAGNOSIS — C50412 Malignant neoplasm of upper-outer quadrant of left female breast: Secondary | ICD-10-CM

## 2015-10-01 DIAGNOSIS — D701 Agranulocytosis secondary to cancer chemotherapy: Secondary | ICD-10-CM | POA: Diagnosis not present

## 2015-10-01 MED ORDER — TBO-FILGRASTIM 480 MCG/0.8ML ~~LOC~~ SOSY
480.0000 ug | PREFILLED_SYRINGE | Freq: Once | SUBCUTANEOUS | Status: AC
  Administered 2015-10-01: 480 ug via SUBCUTANEOUS
  Filled 2015-10-01: qty 0.8

## 2015-10-01 MED ORDER — FILGRASTIM 480 MCG/0.8ML IJ SOSY: 480.0000 ug | PREFILLED_SYRINGE | Freq: Once | INTRAMUSCULAR | Status: DC

## 2015-10-01 NOTE — Patient Instructions (Signed)

## 2015-10-02 ENCOUNTER — Encounter: Payer: Self-pay | Admitting: Pharmacist

## 2015-10-03 ENCOUNTER — Other Ambulatory Visit (HOSPITAL_BASED_OUTPATIENT_CLINIC_OR_DEPARTMENT_OTHER): Payer: Medicaid Other

## 2015-10-03 ENCOUNTER — Ambulatory Visit (HOSPITAL_BASED_OUTPATIENT_CLINIC_OR_DEPARTMENT_OTHER): Payer: Medicaid Other

## 2015-10-03 VITALS — BP 114/75 | HR 58 | Temp 98.1°F | Resp 18

## 2015-10-03 DIAGNOSIS — C50412 Malignant neoplasm of upper-outer quadrant of left female breast: Secondary | ICD-10-CM

## 2015-10-03 DIAGNOSIS — Z5111 Encounter for antineoplastic chemotherapy: Secondary | ICD-10-CM | POA: Diagnosis present

## 2015-10-03 LAB — COMPREHENSIVE METABOLIC PANEL
ALT: 37 U/L (ref 0–55)
ANION GAP: 7 meq/L (ref 3–11)
AST: 48 U/L — AB (ref 5–34)
Albumin: 3.8 g/dL (ref 3.5–5.0)
Alkaline Phosphatase: 182 U/L — ABNORMAL HIGH (ref 40–150)
BUN: 5.6 mg/dL — AB (ref 7.0–26.0)
CALCIUM: 9.7 mg/dL (ref 8.4–10.4)
CHLORIDE: 105 meq/L (ref 98–109)
CO2: 30 meq/L — AB (ref 22–29)
CREATININE: 0.7 mg/dL (ref 0.6–1.1)
EGFR: >90 mL/min/{1.73_m2} (ref >90)
Glucose: 93 mg/dl (ref 70–140)
POTASSIUM: 3.9 meq/L (ref 3.5–5.1)
Sodium: 141 mEq/L (ref 136–145)
Total Bilirubin: 0.33 mg/dL (ref 0.20–1.20)
Total Protein: 7.7 g/dL (ref 6.4–8.3)

## 2015-10-03 LAB — CBC WITH DIFFERENTIAL/PLATELET
BASO%: 0.3 % (ref 0.0–2.0)
BASOS ABS: 0 10*3/uL (ref 0.0–0.1)
EOS%: 0.2 % (ref 0.0–7.0)
Eosinophils Absolute: 0 10*3/uL (ref 0.0–0.5)
HEMATOCRIT: 30.3 % — AB (ref 34.8–46.6)
HGB: 9.5 g/dL — ABNORMAL LOW (ref 11.6–15.9)
LYMPH%: 16.5 % (ref 14.0–49.7)
MCH: 27.6 pg (ref 25.1–34.0)
MCHC: 31.5 g/dL (ref 31.5–36.0)
MCV: 87.8 fL (ref 79.5–101.0)
MONO#: 0.6 10*3/uL (ref 0.1–0.9)
MONO%: 4.6 % (ref 0.0–14.0)
NEUT#: 9.8 10*3/uL — ABNORMAL HIGH (ref 1.5–6.5)
NEUT%: 78.4 % — AB (ref 38.4–76.8)
Platelets: 295 10*3/uL (ref 145–400)
RBC: 3.45 10*6/uL — AB (ref 3.70–5.45)
RDW: 22.5 % — ABNORMAL HIGH (ref 11.2–14.5)
WBC: 12.6 10*3/uL — ABNORMAL HIGH (ref 3.9–10.3)
lymph#: 2.1 10*3/uL (ref 0.9–3.3)

## 2015-10-03 MED ORDER — SODIUM CHLORIDE 0.9 % IV SOLN
250.0000 mg | Freq: Once | INTRAVENOUS | Status: AC
  Administered 2015-10-03: 250 mg via INTRAVENOUS
  Filled 2015-10-03: qty 25

## 2015-10-03 MED ORDER — HEPARIN SOD (PORK) LOCK FLUSH 100 UNIT/ML IV SOLN
500.0000 [IU] | Freq: Once | INTRAVENOUS | Status: AC | PRN
  Administered 2015-10-03: 500 [IU]
  Filled 2015-10-03: qty 5

## 2015-10-03 MED ORDER — PALONOSETRON HCL INJECTION 0.25 MG/5ML
INTRAVENOUS | Status: AC
  Filled 2015-10-03: qty 5

## 2015-10-03 MED ORDER — PALONOSETRON HCL INJECTION 0.25 MG/5ML
0.2500 mg | Freq: Once | INTRAVENOUS | Status: AC
  Administered 2015-10-03: 0.25 mg via INTRAVENOUS

## 2015-10-03 MED ORDER — GEMCITABINE HCL CHEMO INJECTION 1 GM/26.3ML
1300.0000 mg | Freq: Once | INTRAVENOUS | Status: AC
  Administered 2015-10-03: 1300 mg via INTRAVENOUS
  Filled 2015-10-03: qty 34.19

## 2015-10-03 MED ORDER — SODIUM CHLORIDE 0.9 % IJ SOLN
10.0000 mL | INTRAMUSCULAR | Status: DC | PRN
  Administered 2015-10-03: 10 mL
  Filled 2015-10-03: qty 10

## 2015-10-03 MED ORDER — SODIUM CHLORIDE 0.9 % IV SOLN
Freq: Once | INTRAVENOUS | Status: AC
  Administered 2015-10-03: 10:00:00 via INTRAVENOUS

## 2015-10-03 MED ORDER — SODIUM CHLORIDE 0.9 % IV SOLN
10.0000 mg | Freq: Once | INTRAVENOUS | Status: AC
  Administered 2015-10-03: 10 mg via INTRAVENOUS
  Filled 2015-10-03: qty 1

## 2015-10-03 NOTE — Patient Instructions (Signed)
[facility] Discharge Instructions for Patients Receiving Chemotherapy  Today you received the following chemotherapy agents Gemzar, Carboplatin.   To help prevent nausea and vomiting after your treatment, we encourage you to take your nausea medication as prescribed.    If you develop nausea and vomiting that is not controlled by your nausea medication, call the clinic.   BELOW ARE SYMPTOMS THAT SHOULD BE REPORTED IMMEDIATELY:  *FEVER GREATER THAN 100.5 F  *CHILLS WITH OR WITHOUT FEVER  NAUSEA AND VOMITING THAT IS NOT CONTROLLED WITH YOUR NAUSEA MEDICATION  *UNUSUAL SHORTNESS OF BREATH  *UNUSUAL BRUISING OR BLEEDING  TENDERNESS IN MOUTH AND THROAT WITH OR WITHOUT PRESENCE OF ULCERS  *URINARY PROBLEMS  *BOWEL PROBLEMS  UNUSUAL RASH Items with * indicate a potential emergency and should be followed up as soon as possible.  Feel free to call the clinic you have any questions or concerns. The clinic phone number is [phone].  Please show the CHEMO ALERT CARD at check-in to the Emergency Department and triage nurse.   

## 2015-10-14 ENCOUNTER — Telehealth: Payer: Self-pay

## 2015-10-14 NOTE — Telephone Encounter (Signed)
Pt called in questioning if she had to have the neupogen shot since her counts were up last visit.  I advised pt she has since had chemotherapy which will take her counts down again, and that Dr. [name] wants her to have the neupogen so she doesn't miss treatment due to counts and stays on schedule.  Pt voiced understanding.

## 2015-10-15 ENCOUNTER — Ambulatory Visit (HOSPITAL_BASED_OUTPATIENT_CLINIC_OR_DEPARTMENT_OTHER): Payer: Medicaid Other

## 2015-10-15 VITALS — BP 109/73 | HR 69 | Temp 98.6°F

## 2015-10-15 DIAGNOSIS — D701 Agranulocytosis secondary to cancer chemotherapy: Secondary | ICD-10-CM | POA: Diagnosis not present

## 2015-10-15 DIAGNOSIS — C50412 Malignant neoplasm of upper-outer quadrant of left female breast: Secondary | ICD-10-CM | POA: Diagnosis not present

## 2015-10-15 MED ORDER — TBO-FILGRASTIM 480 MCG/0.8ML ~~LOC~~ SOSY
480.0000 ug | PREFILLED_SYRINGE | Freq: Once | SUBCUTANEOUS | Status: AC
  Administered 2015-10-15: 480 ug via SUBCUTANEOUS
  Filled 2015-10-15: qty 0.8

## 2015-10-17 ENCOUNTER — Other Ambulatory Visit (HOSPITAL_BASED_OUTPATIENT_CLINIC_OR_DEPARTMENT_OTHER): Payer: Medicaid Other

## 2015-10-17 ENCOUNTER — Ambulatory Visit (HOSPITAL_BASED_OUTPATIENT_CLINIC_OR_DEPARTMENT_OTHER): Payer: Medicaid Other

## 2015-10-17 ENCOUNTER — Encounter: Payer: Self-pay | Admitting: Hematology and Oncology

## 2015-10-17 ENCOUNTER — Ambulatory Visit (HOSPITAL_BASED_OUTPATIENT_CLINIC_OR_DEPARTMENT_OTHER): Payer: Medicaid Other | Admitting: Hematology and Oncology

## 2015-10-17 VITALS — BP 110/74 | HR 65 | Temp 98.3°F | Resp 18 | Ht 62.0 in | Wt 139.7 lb

## 2015-10-17 DIAGNOSIS — R770 Abnormality of albumin: Secondary | ICD-10-CM

## 2015-10-17 DIAGNOSIS — C50412 Malignant neoplasm of upper-outer quadrant of left female breast: Secondary | ICD-10-CM | POA: Diagnosis not present

## 2015-10-17 DIAGNOSIS — C787 Secondary malignant neoplasm of liver and intrahepatic bile duct: Secondary | ICD-10-CM

## 2015-10-17 DIAGNOSIS — R180 Malignant ascites: Secondary | ICD-10-CM | POA: Diagnosis not present

## 2015-10-17 DIAGNOSIS — D701 Agranulocytosis secondary to cancer chemotherapy: Secondary | ICD-10-CM | POA: Diagnosis not present

## 2015-10-17 DIAGNOSIS — Z5111 Encounter for antineoplastic chemotherapy: Secondary | ICD-10-CM | POA: Diagnosis not present

## 2015-10-17 DIAGNOSIS — Z171 Estrogen receptor negative status [ER-]: Secondary | ICD-10-CM | POA: Diagnosis not present

## 2015-10-17 DIAGNOSIS — D6481 Anemia due to antineoplastic chemotherapy: Secondary | ICD-10-CM | POA: Diagnosis not present

## 2015-10-17 DIAGNOSIS — C773 Secondary and unspecified malignant neoplasm of axilla and upper limb lymph nodes: Secondary | ICD-10-CM | POA: Diagnosis not present

## 2015-10-17 LAB — CBC WITH DIFFERENTIAL/PLATELET
BASO%: 0.4 % (ref 0.0–2.0)
Basophils Absolute: 0 10*3/uL (ref 0.0–0.1)
EOS%: 0.4 % (ref 0.0–7.0)
Eosinophils Absolute: 0 10*3/uL (ref 0.0–0.5)
HEMATOCRIT: 29.9 % — AB (ref 34.8–46.6)
HGB: 9.5 g/dL — ABNORMAL LOW (ref 11.6–15.9)
LYMPH#: 1.7 10*3/uL (ref 0.9–3.3)
LYMPH%: 15.9 % (ref 14.0–49.7)
MCH: 28.9 pg (ref 25.1–34.0)
MCHC: 31.7 g/dL (ref 31.5–36.0)
MCV: 90.9 fL (ref 79.5–101.0)
MONO#: 1.2 10*3/uL — ABNORMAL HIGH (ref 0.1–0.9)
MONO%: 11.3 % (ref 0.0–14.0)
NEUT#: 7.6 10*3/uL — ABNORMAL HIGH (ref 1.5–6.5)
NEUT%: 72 % (ref 38.4–76.8)
Platelets: 248 10*3/uL (ref 145–400)
RBC: 3.29 10*6/uL — AB (ref 3.70–5.45)
RDW: 25 % — ABNORMAL HIGH (ref 11.2–14.5)
WBC: 10.6 10*3/uL — ABNORMAL HIGH (ref 3.9–10.3)

## 2015-10-17 LAB — COMPREHENSIVE METABOLIC PANEL
ALK PHOS: 165 U/L — AB (ref 40–150)
ALT: 30 U/L (ref 0–55)
AST: 39 U/L — AB (ref 5–34)
Albumin: 3.9 g/dL (ref 3.5–5.0)
Anion Gap: 7 mEq/L (ref 3–11)
BILIRUBIN TOTAL: 0.32 mg/dL (ref 0.20–1.20)
BUN: 5.8 mg/dL — ABNORMAL LOW (ref 7.0–26.0)
CHLORIDE: 105 meq/L (ref 98–109)
CO2: 29 mEq/L (ref 22–29)
Calcium: 9.5 mg/dL (ref 8.4–10.4)
Creatinine: 0.8 mg/dL (ref 0.6–1.1)
EGFR: >90 mL/min/{1.73_m2} (ref >90)
GLUCOSE: 91 mg/dL (ref 70–140)
Potassium: 4 mEq/L (ref 3.5–5.1)
SODIUM: 141 meq/L (ref 136–145)
Total Protein: 7.8 g/dL (ref 6.4–8.3)

## 2015-10-17 MED ORDER — SODIUM CHLORIDE 0.9 % IV SOLN
1300.0000 mg | Freq: Once | INTRAVENOUS | Status: AC
  Administered 2015-10-17: 1300 mg via INTRAVENOUS
  Filled 2015-10-17: qty 34.19

## 2015-10-17 MED ORDER — SODIUM CHLORIDE 0.9 % IV SOLN
250.0000 mg | Freq: Once | INTRAVENOUS | Status: AC
  Administered 2015-10-17: 250 mg via INTRAVENOUS
  Filled 2015-10-17: qty 25

## 2015-10-17 MED ORDER — PALONOSETRON HCL INJECTION 0.25 MG/5ML
0.2500 mg | Freq: Once | INTRAVENOUS | Status: AC
  Administered 2015-10-17: 0.25 mg via INTRAVENOUS

## 2015-10-17 MED ORDER — HEPARIN SOD (PORK) LOCK FLUSH 100 UNIT/ML IV SOLN
500.0000 [IU] | Freq: Once | INTRAVENOUS | Status: AC | PRN
  Administered 2015-10-17: 500 [IU]
  Filled 2015-10-17: qty 5

## 2015-10-17 MED ORDER — SODIUM CHLORIDE 0.9 % IJ SOLN
10.0000 mL | INTRAMUSCULAR | Status: DC | PRN
  Administered 2015-10-17: 10 mL
  Filled 2015-10-17: qty 10

## 2015-10-17 MED ORDER — SODIUM CHLORIDE 0.9 % IV SOLN
10.0000 mg | Freq: Once | INTRAVENOUS | Status: AC
  Administered 2015-10-17: 10 mg via INTRAVENOUS
  Filled 2015-10-17: qty 1

## 2015-10-17 MED ORDER — PALONOSETRON HCL INJECTION 0.25 MG/5ML
INTRAVENOUS | Status: AC
  Filled 2015-10-17: qty 5

## 2015-10-17 MED ORDER — SODIUM CHLORIDE 0.9 % IV SOLN
Freq: Once | INTRAVENOUS | Status: AC
  Administered 2015-10-17: 10:00:00 via INTRAVENOUS

## 2015-10-17 NOTE — Patient Instructions (Signed)
[facility] Discharge Instructions for Patients Receiving Chemotherapy  Today you received the following chemotherapy agents:  Gemzar and Carboplatin.  To help prevent nausea and vomiting after your treatment, we encourage you to take your nausea medication as prescribed.   If you develop nausea and vomiting that is not controlled by your nausea medication, call the clinic.   BELOW ARE SYMPTOMS THAT SHOULD BE REPORTED IMMEDIATELY:  *FEVER GREATER THAN 100.5 F  *CHILLS WITH OR WITHOUT FEVER  NAUSEA AND VOMITING THAT IS NOT CONTROLLED WITH YOUR NAUSEA MEDICATION  *UNUSUAL SHORTNESS OF BREATH  *UNUSUAL BRUISING OR BLEEDING  TENDERNESS IN MOUTH AND THROAT WITH OR WITHOUT PRESENCE OF ULCERS  *URINARY PROBLEMS  *BOWEL PROBLEMS  UNUSUAL RASH Items with * indicate a potential emergency and should be followed up as soon as possible.  Feel free to call the clinic you have any questions or concerns. The clinic phone number is [phone].  Please show the CHEMO ALERT CARD at check-in to the Emergency Department and triage nurse.   

## 2015-10-17 NOTE — Progress Notes (Signed)
Unable to get in to exam room prior to MD.  No assessment performed.  

## 2015-10-17 NOTE — Assessment & Plan Note (Signed)
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 Her blood counts specially her CBC appeared to be reasonably steady. 1. Normocytic anemia: Probably related to chemotherapy as well as anemia of chronic disease. We will have to monitor closely. 2. Neutropenia: I added a dose of Neupogen to be given 2 days prior to each chemotherapy  Recurrent malignant ascites: Resolved. Peritoneal catheter has been removed. Elevated liver function tests: Related to extensive liver metastases. AST levels are slowly coming down. We will have to watch her very closely. Alkaline phosphatase and total bilirubin have normalized  Severe hypoalbuminemia: Patient started a protein drink . our dietitian had met her. Lower extremity edema: Related to hypoalbuminemia. resolved Palliative care  CT chest abdomen pelvis 09/19/2015: Moderate improvement in metastatic disease. Liver lesions smaller (1.9 cm decreased to 8 mm, 7.1 cm lesion decreased to 3.2 cm, 5.4 cm lesion decreased to 2.5 cm)  Based on the good response by CT scan and clinical signs, plan to give her a total of 6 cycles of chemotherapy.  Return to clinic in 3 weeks for cycle 5  of chemotherapy.

## 2015-10-17 NOTE — Progress Notes (Signed)
Patient Care Team: Pcp Not In System as PCP - General [name], MD as Consulting Physician (General Surgery) Nicholas Lose, MD as Consulting Physician (Hematology and Oncology) Kyung Rudd, MD as Consulting Physician (Radiation Oncology)  DIAGNOSIS: Breast cancer of upper-outer quadrant of left female breast [facility])   Staging form: Breast, AJCC 7th Edition     Clinical stage from 07/17/2015: Stage IIIA (T3, N1, M0) - Unsigned       Staging comments: Staged at breast conference on 1.4.17  SUMMARY OF ONCOLOGIC HISTORY:   Breast cancer of upper-outer quadrant of left female breast ([place])   07/02/2015 Mammogram Left breast mass 5.3 x 5.2 x 2.7 cm at 1:30 position, multiple abnormal enlarged left axillary lymph nodes   07/09/2015 Initial Diagnosis Left breast biopsy 1:30: IDC grade 3, ER PR 0%, HER-2 negative ratio 1.65, Ki-67 90%; left axillary lymph node biopsy: IDC grade 3 completely replacing the lymph node ER PR 0%, HER-2 negative ratio 1.22 Ki-67 90%   07/25/2015 -  Chemotherapy Palliative chemotherapy: Carboplatin and gemcitabine day 1 and day 8 every 3 weeks ( metastatic breast cancer with extensive liver metastases and malignant recurrent ascites)    CHIEF COMPLIANT: cycle 5 day 1 carboplatin and gemcitabine  INTERVAL HISTORY: Dawn Haynes is a 35 year old with above-mentioned history of metastatic breast 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 Today is cycle 5 day 1. She has been doing quite well on the chemotherapy standpoint she does not have any nausea or vomiting. Does not have any taste problems. Denies any constipation or diarrhea. Her abdominal distention has resolved. Lower extremity edema has improved. Her diet and nutrition was improved.  REVIEW OF SYSTEMS:   Constitutional: Denies fevers, chills or abnormal weight loss Eyes: Denies blurriness of vision Ears, nose, mouth, throat, and face: Denies mucositis or sore  throat Respiratory: Denies cough, dyspnea or wheezes Cardiovascular: Denies palpitation, chest discomfort Gastrointestinal:  Denies nausea, heartburn or change in bowel habits Skin: Denies abnormal skin rashes Lymphatics: Denies new lymphadenopathy or easy bruising Neurological:Denies numbness, tingling or new weaknesses Behavioral/Psych: Mood is stable, no new changes  Extremities: No lower extremity edema  All other systems were reviewed with the patient and are negative.  I have reviewed the past medical history, past surgical history, social history and family history with the patient and they are unchanged from previous note.  ALLERGIES:  has No Known Allergies.  MEDICATIONS:  Current Outpatient Prescriptions  Medication Sig Dispense Refill  . BORON PO Take 1 tablet by mouth 3 (three) times daily as needed (gas/flatulence).    . Cholecalciferol (VITAMIN D) 2000 units CAPS Take 2,000 Units by mouth daily.     . ondansetron (ZOFRAN) 4 MG tablet Take 1 tablet (4 mg total) by mouth every 8 (eight) hours as needed for nausea or vomiting. 30 tablet 0  . oxyCODONE (OXY IR/ROXICODONE) 5 MG immediate release tablet Take 1 tablet (5 mg total) by mouth every 4 (four) hours as needed for moderate pain. 60 tablet 0  . polyethylene glycol (MIRALAX / GLYCOLAX) packet Take 17 g by mouth daily. May increase to 34 g daily if needed 30 each 0  . Probiotic Product (SOLUBLE FIBER/PROBIOTICS PO) Take 1 tablet by mouth daily.     Marland Kitchen senna-docusate (SENOKOT-S) 8.6-50 MG tablet Take 2 tablets by mouth daily. 20 tablet 0  . vitamin B-12 (CYANOCOBALAMIN) 100 MCG tablet Take 100 mcg by mouth daily.     No current facility-administered medications  for this visit.    PHYSICAL EXAMINATION: ECOG PERFORMANCE STATUS: 0 - Asymptomatic  Filed Vitals:   10/17/15 0930  BP: 110/74  Pulse: 65  Temp: 98.3 F (36.8 C)  Resp: 18   Filed Weights   10/17/15 0930  Weight: 139 lb 11.2 oz (63.368 kg)     GENERAL:alert, no distress and comfortable SKIN: skin color, texture, turgor are normal, no rashes or significant lesions EYES: normal, Conjunctiva are pink and non-injected, sclera clear OROPHARYNX:no exudate, no erythema and lips, buccal mucosa, and tongue normal  NECK: supple, thyroid normal size, non-tender, without nodularity LYMPH:  no palpable lymphadenopathy in the cervical, axillary or inguinal LUNGS: clear to auscultation and percussion with normal breathing effort HEART: regular rate & rhythm and no murmurs and no lower extremity edema ABDOMEN:abdomen soft, non-tender and normal bowel sounds MUSCULOSKELETAL:no cyanosis of digits and no clubbing  NEURO: alert & oriented x 3 with fluent speech, no focal motor/sensory deficits EXTREMITIES: No lower extremity edema  LABORATORY DATA:  I have reviewed the data as listed   Chemistry      Component Value Date/Time   NA 141 10/17/2015 0909   NA 135 08/02/2015 2242   K 4.0 10/17/2015 0909   K 3.8 08/02/2015 2242   CL 97* 08/02/2015 2242   CO2 29 10/17/2015 0909   CO2 28 08/02/2015 2242   BUN 5.8* 10/17/2015 0909   BUN 10 08/02/2015 2242   CREATININE 0.8 10/17/2015 0909   CREATININE 0.44 08/02/2015 2242      Component Value Date/Time   CALCIUM 9.5 10/17/2015 0909   CALCIUM 7.9* 08/02/2015 2242   ALKPHOS 165* 10/17/2015 0909   ALKPHOS 362* 07/29/2015 0605   AST 39* 10/17/2015 0909   AST 387* 07/29/2015 0605   ALT 30 10/17/2015 0909   ALT 95* 07/29/2015 0605   BILITOT 0.32 10/17/2015 0909   BILITOT 1.9* 07/29/2015 0605       Lab Results  Component Value Date   WBC 10.6* 10/17/2015   HGB 9.5* 10/17/2015   HCT 29.9* 10/17/2015   MCV 90.9 10/17/2015   PLT 248 10/17/2015   NEUTROABS 7.6* 10/17/2015     ASSESSMENT & PLAN:  Breast cancer of upper-outer quadrant of left female breast (HCC) 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, Ki-67 90%;  left axillary lymph  node biopsy: IDC grade 3 completely replacing the lymph node ER PR 0%, HER-2 negative ratio 1.22 Ki-67 90% Left breast mass 5.3 x 5.2 x 2.7 cm at 1:30 position, multiple abnormal enlarged left axillary lymph nodes 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 Her blood counts specially her CBC appeared to be reasonably steady. 1. Normocytic anemia: Probably related to chemotherapy as well as anemia of chronic disease. We will have to monitor closely. 2. Neutropenia: I added a dose of Neupogen to be given 2 days prior to each chemotherapy  Recurrent malignant ascites: Resolved. Peritoneal catheter has been removed. Elevated liver function tests: Related to extensive liver metastases. AST levels are slowly coming down. We will have to watch her very closely. Alkaline phosphatase and total bilirubin have normalized  Severe hypoalbuminemia: Patient started a protein drink . our dietitian had met  her. Lower extremity edema: Related to hypoalbuminemia. resolved Palliative care  CT chest abdomen pelvis 09/19/2015: Moderate improvement in metastatic disease. Liver lesions smaller (1.9 cm decreased to 8 mm, 7.1 cm lesion decreased to 3.2 cm, 5.4 cm lesion decreased to 2.5 cm)  Based on the good response by CT scan and clinical signs, plan to give her a total of 6 cycles of chemotherapy.  Return to clinic in 3 weeks for cycle 5 of chemotherapy.   No orders of the defined types were placed in this encounter.   The patient has a good  understanding of the overall plan. she agrees with it. she will call with any problems that may develop before the next visit here.   Rulon Eisenmenger, MD 10/17/2015

## 2015-10-18 ENCOUNTER — Telehealth: Payer: Self-pay | Admitting: Hematology and Oncology

## 2015-10-18 NOTE — Telephone Encounter (Signed)
appt made per 4/6 pof. Pt will get updated sch on next visit

## 2015-10-22 ENCOUNTER — Other Ambulatory Visit: Payer: Self-pay | Admitting: Oncology

## 2015-10-22 ENCOUNTER — Telehealth: Payer: Self-pay | Admitting: *Deleted

## 2015-10-22 ENCOUNTER — Ambulatory Visit (HOSPITAL_BASED_OUTPATIENT_CLINIC_OR_DEPARTMENT_OTHER): Payer: Medicaid Other

## 2015-10-22 ENCOUNTER — Other Ambulatory Visit: Payer: Self-pay | Admitting: *Deleted

## 2015-10-22 ENCOUNTER — Other Ambulatory Visit: Payer: Self-pay | Admitting: Nurse Practitioner

## 2015-10-22 VITALS — BP 105/66 | HR 68 | Temp 98.5°F

## 2015-10-22 DIAGNOSIS — D701 Agranulocytosis secondary to cancer chemotherapy: Secondary | ICD-10-CM | POA: Diagnosis not present

## 2015-10-22 DIAGNOSIS — C50412 Malignant neoplasm of upper-outer quadrant of left female breast: Secondary | ICD-10-CM

## 2015-10-22 MED ORDER — TBO-FILGRASTIM 480 MCG/0.8ML ~~LOC~~ SOSY
480.0000 ug | PREFILLED_SYRINGE | Freq: Once | SUBCUTANEOUS | Status: AC
  Administered 2015-10-22: 480 ug via SUBCUTANEOUS
  Filled 2015-10-22: qty 0.8

## 2015-10-22 NOTE — Telephone Encounter (Signed)
Patient called wanting to know if she needed to come in for her Granix injection as she doesn't have treatment until 4/27. Discussed with Dr. Jana Hakim, patient advised to come for injection appt. She verbalized understanding.

## 2015-10-24 ENCOUNTER — Other Ambulatory Visit (HOSPITAL_BASED_OUTPATIENT_CLINIC_OR_DEPARTMENT_OTHER): Payer: Medicaid Other

## 2015-10-24 ENCOUNTER — Ambulatory Visit (HOSPITAL_BASED_OUTPATIENT_CLINIC_OR_DEPARTMENT_OTHER): Payer: Medicaid Other

## 2015-10-24 VITALS — BP 110/61 | HR 70 | Temp 98.5°F | Resp 18

## 2015-10-24 DIAGNOSIS — Z5111 Encounter for antineoplastic chemotherapy: Secondary | ICD-10-CM | POA: Diagnosis present

## 2015-10-24 DIAGNOSIS — C50412 Malignant neoplasm of upper-outer quadrant of left female breast: Secondary | ICD-10-CM | POA: Diagnosis not present

## 2015-10-24 LAB — CBC WITH DIFFERENTIAL/PLATELET
BASO%: 0.1 % (ref 0.0–2.0)
Basophils Absolute: 0 10*3/uL (ref 0.0–0.1)
EOS%: 0.2 % (ref 0.0–7.0)
Eosinophils Absolute: 0 10*3/uL (ref 0.0–0.5)
HCT: 29.3 % — ABNORMAL LOW (ref 34.8–46.6)
HEMOGLOBIN: 9 g/dL — AB (ref 11.6–15.9)
LYMPH%: 11.4 % — ABNORMAL LOW (ref 14.0–49.7)
MCH: 28.6 pg (ref 25.1–34.0)
MCHC: 30.7 g/dL — ABNORMAL LOW (ref 31.5–36.0)
MCV: 93 fL (ref 79.5–101.0)
MONO#: 0.7 10*3/uL (ref 0.1–0.9)
MONO%: 3.7 % (ref 0.0–14.0)
NEUT%: 84.6 % — ABNORMAL HIGH (ref 38.4–76.8)
NEUTROS ABS: 16.6 10*3/uL — AB (ref 1.5–6.5)
Platelets: 280 10*3/uL (ref 145–400)
RBC: 3.15 10*6/uL — ABNORMAL LOW (ref 3.70–5.45)
RDW: 20.6 % — AB (ref 11.2–14.5)
WBC: 19.6 10*3/uL — AB (ref 3.9–10.3)
lymph#: 2.2 10*3/uL (ref 0.9–3.3)

## 2015-10-24 LAB — COMPREHENSIVE METABOLIC PANEL
ALT: 35 U/L (ref 0–55)
AST: 49 U/L — AB (ref 5–34)
Albumin: 3.8 g/dL (ref 3.5–5.0)
Alkaline Phosphatase: 176 U/L — ABNORMAL HIGH (ref 40–150)
Anion Gap: 7 mEq/L (ref 3–11)
BUN: 5.6 mg/dL — AB (ref 7.0–26.0)
CHLORIDE: 105 meq/L (ref 98–109)
CO2: 31 meq/L — AB (ref 22–29)
Calcium: 9.6 mg/dL (ref 8.4–10.4)
Creatinine: 0.8 mg/dL (ref 0.6–1.1)
EGFR: >90 mL/min/{1.73_m2} (ref >90)
GLUCOSE: 78 mg/dL (ref 70–140)
POTASSIUM: 3.4 meq/L — AB (ref 3.5–5.1)
SODIUM: 143 meq/L (ref 136–145)
TOTAL PROTEIN: 7.6 g/dL (ref 6.4–8.3)
Total Bilirubin: <0.3 mg/dL (ref 0.20–1.20)

## 2015-10-24 MED ORDER — SODIUM CHLORIDE 0.9 % IV SOLN
1300.0000 mg | Freq: Once | INTRAVENOUS | Status: AC
  Administered 2015-10-24: 1300 mg via INTRAVENOUS
  Filled 2015-10-24: qty 34.19

## 2015-10-24 MED ORDER — SODIUM CHLORIDE 0.9 % IV SOLN
250.0000 mg | Freq: Once | INTRAVENOUS | Status: AC
  Administered 2015-10-24: 250 mg via INTRAVENOUS
  Filled 2015-10-24: qty 25

## 2015-10-24 MED ORDER — SODIUM CHLORIDE 0.9 % IJ SOLN
10.0000 mL | INTRAMUSCULAR | Status: DC | PRN
  Administered 2015-10-24: 10 mL
  Filled 2015-10-24: qty 10

## 2015-10-24 MED ORDER — SODIUM CHLORIDE 0.9 % IV SOLN
Freq: Once | INTRAVENOUS | Status: AC
  Administered 2015-10-24: 10:00:00 via INTRAVENOUS

## 2015-10-24 MED ORDER — PALONOSETRON HCL INJECTION 0.25 MG/5ML
INTRAVENOUS | Status: AC
  Filled 2015-10-24: qty 5

## 2015-10-24 MED ORDER — SODIUM CHLORIDE 0.9 % IV SOLN
10.0000 mg | Freq: Once | INTRAVENOUS | Status: AC
  Administered 2015-10-24: 10 mg via INTRAVENOUS
  Filled 2015-10-24: qty 1

## 2015-10-24 MED ORDER — PALONOSETRON HCL INJECTION 0.25 MG/5ML
0.2500 mg | Freq: Once | INTRAVENOUS | Status: AC
  Administered 2015-10-24: 0.25 mg via INTRAVENOUS

## 2015-10-24 MED ORDER — HEPARIN SOD (PORK) LOCK FLUSH 100 UNIT/ML IV SOLN
500.0000 [IU] | Freq: Once | INTRAVENOUS | Status: AC | PRN
Start: 2015-10-24 — End: 2015-10-24
  Administered 2015-10-24: 500 [IU]
  Filled 2015-10-24: qty 5

## 2015-10-24 NOTE — Patient Instructions (Signed)
[facility] Discharge Instructions for Patients Receiving Chemotherapy  Today you received the following chemotherapy agents:  Gemzar and Carboplatin.  To help prevent nausea and vomiting after your treatment, we encourage you to take your nausea medication as prescribed.   If you develop nausea and vomiting that is not controlled by your nausea medication, call the clinic.   BELOW ARE SYMPTOMS THAT SHOULD BE REPORTED IMMEDIATELY:  *FEVER GREATER THAN 100.5 F  *CHILLS WITH OR WITHOUT FEVER  NAUSEA AND VOMITING THAT IS NOT CONTROLLED WITH YOUR NAUSEA MEDICATION  *UNUSUAL SHORTNESS OF BREATH  *UNUSUAL BRUISING OR BLEEDING  TENDERNESS IN MOUTH AND THROAT WITH OR WITHOUT PRESENCE OF ULCERS  *URINARY PROBLEMS  *BOWEL PROBLEMS  UNUSUAL RASH Items with * indicate a potential emergency and should be followed up as soon as possible.  Feel free to call the clinic you have any questions or concerns. The clinic phone number is [phone].  Please show the CHEMO ALERT CARD at check-in to the Emergency Department and triage nurse.   

## 2015-11-05 ENCOUNTER — Encounter: Payer: Self-pay | Admitting: *Deleted

## 2015-11-06 ENCOUNTER — Other Ambulatory Visit: Payer: Self-pay

## 2015-11-07 ENCOUNTER — Ambulatory Visit: Payer: Medicaid Other

## 2015-11-07 ENCOUNTER — Other Ambulatory Visit (HOSPITAL_BASED_OUTPATIENT_CLINIC_OR_DEPARTMENT_OTHER): Payer: Medicaid Other

## 2015-11-07 ENCOUNTER — Encounter: Payer: Self-pay | Admitting: Hematology and Oncology

## 2015-11-07 ENCOUNTER — Encounter (HOSPITAL_COMMUNITY): Payer: Self-pay

## 2015-11-07 ENCOUNTER — Ambulatory Visit (HOSPITAL_BASED_OUTPATIENT_CLINIC_OR_DEPARTMENT_OTHER): Payer: Medicaid Other | Admitting: Hematology and Oncology

## 2015-11-07 ENCOUNTER — Other Ambulatory Visit: Payer: Self-pay

## 2015-11-07 VITALS — BP 106/71 | HR 64 | Temp 98.3°F | Resp 18 | Wt 136.8 lb

## 2015-11-07 DIAGNOSIS — D701 Agranulocytosis secondary to cancer chemotherapy: Secondary | ICD-10-CM

## 2015-11-07 DIAGNOSIS — Z171 Estrogen receptor negative status [ER-]: Secondary | ICD-10-CM | POA: Diagnosis not present

## 2015-11-07 DIAGNOSIS — D6481 Anemia due to antineoplastic chemotherapy: Secondary | ICD-10-CM

## 2015-11-07 DIAGNOSIS — R770 Abnormality of albumin: Secondary | ICD-10-CM | POA: Diagnosis not present

## 2015-11-07 DIAGNOSIS — C50412 Malignant neoplasm of upper-outer quadrant of left female breast: Secondary | ICD-10-CM

## 2015-11-07 DIAGNOSIS — R600 Localized edema: Secondary | ICD-10-CM

## 2015-11-07 DIAGNOSIS — C773 Secondary and unspecified malignant neoplasm of axilla and upper limb lymph nodes: Secondary | ICD-10-CM

## 2015-11-07 DIAGNOSIS — C787 Secondary malignant neoplasm of liver and intrahepatic bile duct: Secondary | ICD-10-CM | POA: Diagnosis not present

## 2015-11-07 DIAGNOSIS — R180 Malignant ascites: Secondary | ICD-10-CM

## 2015-11-07 LAB — CBC WITH DIFFERENTIAL/PLATELET
BASO%: 0.4 % (ref 0.0–2.0)
Basophils Absolute: 0 10*3/uL (ref 0.0–0.1)
EOS%: 0.8 % (ref 0.0–7.0)
Eosinophils Absolute: 0 10*3/uL (ref 0.0–0.5)
HEMATOCRIT: 29.3 % — AB (ref 34.8–46.6)
HGB: 9.3 g/dL — ABNORMAL LOW (ref 11.6–15.9)
LYMPH%: 55.3 % — AB (ref 14.0–49.7)
MCH: 29.7 pg (ref 25.1–34.0)
MCHC: 31.7 g/dL (ref 31.5–36.0)
MCV: 93.6 fL (ref 79.5–101.0)
MONO#: 0.7 10*3/uL (ref 0.1–0.9)
MONO%: 25.7 % — AB (ref 0.0–14.0)
NEUT%: 17.8 % — AB (ref 38.4–76.8)
NEUTROS ABS: 0.5 10*3/uL — AB (ref 1.5–6.5)
Platelets: 128 10*3/uL — ABNORMAL LOW (ref 145–400)
RBC: 3.13 10*6/uL — AB (ref 3.70–5.45)
RDW: 20.4 % — ABNORMAL HIGH (ref 11.2–14.5)
WBC: 2.5 10*3/uL — AB (ref 3.9–10.3)
lymph#: 1.4 10*3/uL (ref 0.9–3.3)
nRBC: 0 % (ref 0–0)

## 2015-11-07 LAB — COMPREHENSIVE METABOLIC PANEL
ALT: 40 U/L (ref 0–55)
ANION GAP: 7 meq/L (ref 3–11)
AST: 54 U/L — AB (ref 5–34)
Albumin: 4.2 g/dL (ref 3.5–5.0)
Alkaline Phosphatase: 137 U/L (ref 40–150)
BUN: 8.3 mg/dL (ref 7.0–26.0)
CALCIUM: 9.6 mg/dL (ref 8.4–10.4)
CHLORIDE: 105 meq/L (ref 98–109)
CO2: 29 meq/L (ref 22–29)
Creatinine: 0.9 mg/dL (ref 0.6–1.1)
EGFR: >90 mL/min/{1.73_m2} (ref >90)
Glucose: 85 mg/dl (ref 70–140)
POTASSIUM: 4.1 meq/L (ref 3.5–5.1)
Sodium: 141 mEq/L (ref 136–145)
Total Bilirubin: 0.43 mg/dL (ref 0.20–1.20)
Total Protein: 7.9 g/dL (ref 6.4–8.3)

## 2015-11-07 MED ORDER — TBO-FILGRASTIM 480 MCG/0.8ML ~~LOC~~ SOSY
480.0000 ug | PREFILLED_SYRINGE | Freq: Once | SUBCUTANEOUS | Status: AC
  Administered 2015-11-07: 480 ug via SUBCUTANEOUS
  Filled 2015-11-07: qty 0.8

## 2015-11-07 NOTE — Progress Notes (Signed)
Critical value ANC 0.5 received from Jackie/lab at 1132.  Dr. [name] notified.

## 2015-11-07 NOTE — Progress Notes (Signed)
Patient Care Team: Pcp Not In System as PCP - General [name], MD as Consulting Physician (General Surgery) Nicholas Lose, MD as Consulting Physician (Hematology and Oncology) Kyung Rudd, MD as Consulting Physician (Radiation Oncology)  DIAGNOSIS: Breast cancer of upper-outer quadrant of left female breast [facility])   Staging form: Breast, AJCC 7th Edition     Clinical stage from 07/17/2015: Stage IIIA (T3, N1, M0) - Unsigned       Staging comments: Staged at breast conference on 1.4.17  SUMMARY OF ONCOLOGIC HISTORY:   Breast cancer of upper-outer quadrant of left female breast ([place])   07/02/2015 Mammogram Left breast mass 5.3 x 5.2 x 2.7 cm at 1:30 position, multiple abnormal enlarged left axillary lymph nodes   07/09/2015 Initial Diagnosis Left breast biopsy 1:30: IDC grade 3, ER PR 0%, HER-2 negative ratio 1.65, Ki-67 90%; left axillary lymph node biopsy: IDC grade 3 completely replacing the lymph node ER PR 0%, HER-2 negative ratio 1.22 Ki-67 90%   07/25/2015 -  Chemotherapy Palliative chemotherapy: Carboplatin and gemcitabine day 1 and day 8 every 3 weeks ( metastatic breast cancer with extensive liver metastases and malignant recurrent ascites)    CHIEF COMPLIANT: Cipro 5 carboplatin and gemcitabine being held for neutropenia  INTERVAL HISTORY: Dawn Haynes is a 35 year old lady with above-mentioned symptoms metastatic triple negative breast cancer currently in palliative chemotherapy with carboplatin and gemcitabine. Today is supposed with cycle 5 of treatment however it is going to be held for the low neutrophil count. Previously we have been giving her Neupogen shots prior to chemotherapy but last week Neupogen was not given. That is the reason why her [place] is low today. She otherwise feels great. Denies any abdominal distention. Denies nausea vomiting diarrhea constipation. Has good energy levels and good taste. Denies neuropathy.  REVIEW OF SYSTEMS:   Constitutional: Denies  fevers, chills or abnormal weight loss Eyes: Denies blurriness of vision Ears, nose, mouth, throat, and face: Denies mucositis or sore throat Respiratory: Denies cough, dyspnea or wheezes Cardiovascular: Denies palpitation, chest discomfort Gastrointestinal:  Denies nausea, heartburn or change in bowel habits Skin: Denies abnormal skin rashes Lymphatics: Denies new lymphadenopathy or easy bruising Neurological:Denies numbness, tingling or new weaknesses Behavioral/Psych: Mood is stable, no new changes  Extremities: No lower extremity edema Breast:  denies any pain or lumps or nodules in either breasts All other systems were reviewed with the patient and are negative.  I have reviewed the past medical history, past surgical history, social history and family history with the patient and they are unchanged from previous note.  ALLERGIES:  has No Known Allergies.  MEDICATIONS:  Current Outpatient Prescriptions  Medication Sig Dispense Refill  . BORON PO Take 1 tablet by mouth 3 (three) times daily as needed (gas/flatulence).    . Cholecalciferol (VITAMIN D) 2000 units CAPS Take 2,000 Units by mouth daily.     . ondansetron (ZOFRAN) 4 MG tablet Take 1 tablet (4 mg total) by mouth every 8 (eight) hours as needed for nausea or vomiting. 30 tablet 0  . oxyCODONE (OXY IR/ROXICODONE) 5 MG immediate release tablet Take 1 tablet (5 mg total) by mouth every 4 (four) hours as needed for moderate pain. 60 tablet 0  . polyethylene glycol (MIRALAX / GLYCOLAX) packet Take 17 g by mouth daily. May increase to 34 g daily if needed 30 each 0  . Probiotic Product (SOLUBLE FIBER/PROBIOTICS PO) Take 1 tablet by mouth daily.     Marland Kitchen senna-docusate (SENOKOT-S) 8.6-50 MG tablet  Take 2 tablets by mouth daily. 20 tablet 0  . vitamin B-12 (CYANOCOBALAMIN) 100 MCG tablet Take 100 mcg by mouth daily.     Current Facility-Administered Medications  Medication Dose Route Frequency Provider Last Rate Last Dose  .  Tbo-Filgrastim (GRANIX) injection 480 mcg  480 mcg Subcutaneous Once Nicholas Lose, MD        PHYSICAL EXAMINATION: ECOG PERFORMANCE STATUS: 1 - Symptomatic but completely ambulatory  Filed Vitals:   11/07/15 1132  BP: 106/71  Pulse: 64  Temp: 98.3 F (36.8 C)  Resp: 18   Filed Weights   11/07/15 1132  Weight: 136 lb 12.8 oz (62.052 kg)    GENERAL:alert, no distress and comfortable SKIN: skin color, texture, turgor are normal, no rashes or significant lesions EYES: normal, Conjunctiva are pink and non-injected, sclera clear OROPHARYNX:no exudate, no erythema and lips, buccal mucosa, and tongue normal  NECK: supple, thyroid normal size, non-tender, without nodularity LYMPH:  no palpable lymphadenopathy in the cervical, axillary or inguinal LUNGS: clear to auscultation and percussion with normal breathing effort HEART: regular rate & rhythm and no murmurs and no lower extremity edema ABDOMEN:abdomen soft, non-tender and normal bowel sounds MUSCULOSKELETAL:no cyanosis of digits and no clubbing  NEURO: alert & oriented x 3 with fluent speech, no focal motor/sensory deficits EXTREMITIES: No lower extremity edema   LABORATORY DATA:  I have reviewed the data as listed   Chemistry      Component Value Date/Time   NA 141 11/07/2015 1118   NA 135 08/02/2015 2242   K 4.1 11/07/2015 1118   K 3.8 08/02/2015 2242   CL 97* 08/02/2015 2242   CO2 29 11/07/2015 1118   CO2 28 08/02/2015 2242   BUN 8.3 11/07/2015 1118   BUN 10 08/02/2015 2242   CREATININE 0.9 11/07/2015 1118   CREATININE 0.44 08/02/2015 2242      Component Value Date/Time   CALCIUM 9.6 11/07/2015 1118   CALCIUM 7.9* 08/02/2015 2242   ALKPHOS 137 11/07/2015 1118   ALKPHOS 362* 07/29/2015 0605   AST 54* 11/07/2015 1118   AST 387* 07/29/2015 0605   ALT 40 11/07/2015 1118   ALT 95* 07/29/2015 0605   BILITOT 0.43 11/07/2015 1118   BILITOT 1.9* 07/29/2015 0605       Lab Results  Component Value Date   WBC 2.5*  11/07/2015   HGB 9.3* 11/07/2015   HCT 29.3* 11/07/2015   MCV 93.6 11/07/2015   PLT 128* 11/07/2015   NEUTROABS 0.5* 11/07/2015     ASSESSMENT & PLAN:  Breast cancer of upper-outer quadrant of left female breast (HCC) 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, Ki-67 90%;  left axillary lymph node biopsy: IDC grade 3 completely replacing the lymph node ER PR 0%, HER-2 negative ratio 1.22 Ki-67 90%  Left breast mass 5.3 x 5.2 x 2.7 cm at 1:30 position, multiple abnormal enlarged left axillary lymph nodes  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 Her blood counts specially her CBC appeared to be reasonably steady.  1. Normocytic anemia: Probably related to chemotherapy as well as anemia of chronic disease. We will have to monitor closely.  2. Neutropenia: I added  a dose of Neupogen to be given 2 days prior to each chemotherapy   Recurrent malignant ascites: Resolved. Peritoneal catheter has been removed.   Elevated liver function tests: Related to extensive liver metastases. AST levels are slowly coming down. We will have to watch her very closely. Alkaline phosphatase and total bilirubin have normalized today her AST is 54 and ALT is 40 and alkaline phosphatase is normal at 137  Severe hypoalbuminemia: Patient started a protein drink . our dietitian had met her.   Lower extremity edema: Related to hypoalbuminemia. resolved  Palliative care  CT chest abdomen pelvis 09/19/2015:  Moderate improvement in metastatic disease. Liver lesions smaller (1.9 cm decreased to 8 mm, 7.1 cm lesion decreased to 3.2 cm, 5.4 cm lesion decreased to 2.5 cm)  Based on the good response by CT scan and clinical signs, plan to give her a total of 6 cycles of chemotherapy.   Plan to perform CT scans and follow-up prior to next cycle of treatment.  Return to clinic in 3 weeks for cycle 7 of chemotherapy.    Orders Placed This Encounter  Procedures  . CT Chest W Contrast    Standing Status: Future     Number of Occurrences:      Standing Expiration Date: 11/06/2016    Order Specific Question:  If indicated for the ordered procedure, I authorize the administration of contrast media per Radiology protocol    Answer:  Yes    Order Specific Question:  Reason for Exam (SYMPTOM  OR DIAGNOSIS REQUIRED)    Answer:  Met Breast cancer restaging    Order Specific Question:  Is the patient pregnant?    Answer:  No    Order Specific Question:  Preferred imaging location?    Answer:  [name]  . CT Abdomen Pelvis W Contrast    Standing Status: Future     Number of Occurrences:      Standing Expiration Date: 11/06/2016    Order Specific Question:  If indicated for the ordered procedure, I authorize the administration of contrast media per Radiology protocol    Answer:  Yes    Order Specific Question:  Reason for Exam (SYMPTOM  OR DIAGNOSIS REQUIRED)    Answer:  Met Breast cancer restaging    Order Specific Question:  Is the patient pregnant?    Answer:  No    Order Specific Question:  Preferred imaging location?    Answer:  [facility]   The patient has a good understanding of the overall plan. she agrees with it. she will call with any problems that may develop before the next visit here.   Rulon Eisenmenger, MD 11/07/2015

## 2015-11-07 NOTE — Assessment & Plan Note (Signed)
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 Her blood counts specially her CBC appeared to be reasonably steady.  1. Normocytic anemia: Probably related to chemotherapy as well as anemia of chronic disease. We will have to monitor closely.  2. Neutropenia: I added a dose of Neupogen to be given 2 days prior to each chemotherapy  Recurrent malignant ascites: Resolved. Peritoneal catheter has been removed.  Elevated liver function tests: Related to extensive liver metastases. AST levels are slowly coming down. We will have to watch her very closely. Alkaline phosphatase and total bilirubin have normalized  Severe hypoalbuminemia: Patient started a protein drink . our dietitian had met her.  Lower extremity edema: Related to hypoalbuminemia. resolved  Palliative care  CT chest abdomen pelvis 09/19/2015: Moderate improvement in metastatic disease. Liver lesions smaller (1.9 cm decreased to 8 mm, 7.1 cm lesion decreased to 3.2 cm, 5.4 cm lesion decreased to 2.5 cm)  Based on the good response by CT scan and clinical signs, plan to give her a total of 6 cycles of chemotherapy.   Plan to perform CT scans and  follow-up prior to next cycle of treatment.  Return to clinic in 3 weeks for cycle 7 of chemotherapy.

## 2015-11-12 ENCOUNTER — Telehealth: Payer: Self-pay | Admitting: Hematology and Oncology

## 2015-11-12 NOTE — Telephone Encounter (Signed)
pt cld to cx appt-no r/s @ this time

## 2015-11-13 ENCOUNTER — Ambulatory Visit: Payer: Medicaid Other

## 2015-11-13 ENCOUNTER — Other Ambulatory Visit: Payer: Medicaid Other

## 2015-11-18 ENCOUNTER — Ambulatory Visit: Payer: Medicaid Other

## 2015-11-20 ENCOUNTER — Other Ambulatory Visit (HOSPITAL_BASED_OUTPATIENT_CLINIC_OR_DEPARTMENT_OTHER): Payer: Medicaid Other

## 2015-11-20 DIAGNOSIS — C50412 Malignant neoplasm of upper-outer quadrant of left female breast: Secondary | ICD-10-CM | POA: Diagnosis not present

## 2015-11-20 LAB — CBC WITH DIFFERENTIAL/PLATELET
BASO%: 0.3 % (ref 0.0–2.0)
BASOS ABS: 0 10*3/uL (ref 0.0–0.1)
EOS%: 1.2 % (ref 0.0–7.0)
Eosinophils Absolute: 0 10*3/uL (ref 0.0–0.5)
HCT: 31 % — ABNORMAL LOW (ref 34.8–46.6)
HGB: 9.7 g/dL — ABNORMAL LOW (ref 11.6–15.9)
LYMPH%: 43.3 % (ref 14.0–49.7)
MCH: 29.5 pg (ref 25.1–34.0)
MCHC: 31.3 g/dL — AB (ref 31.5–36.0)
MCV: 94.2 fL (ref 79.5–101.0)
MONO#: 0.4 10*3/uL (ref 0.1–0.9)
MONO%: 11.1 % (ref 0.0–14.0)
NEUT#: 1.4 10*3/uL — ABNORMAL LOW (ref 1.5–6.5)
NEUT%: 44.1 % (ref 38.4–76.8)
Platelets: 208 10*3/uL (ref 145–400)
RBC: 3.29 10*6/uL — AB (ref 3.70–5.45)
RDW: 18 % — ABNORMAL HIGH (ref 11.2–14.5)
WBC: 3.2 10*3/uL — ABNORMAL LOW (ref 3.9–10.3)
lymph#: 1.4 10*3/uL (ref 0.9–3.3)

## 2015-11-20 LAB — COMPREHENSIVE METABOLIC PANEL
ALBUMIN: 3.8 g/dL (ref 3.5–5.0)
ALK PHOS: 104 U/L (ref 40–150)
ALT: 25 U/L (ref 0–55)
AST: 42 U/L — AB (ref 5–34)
Anion Gap: 7 mEq/L (ref 3–11)
BUN: 9.5 mg/dL (ref 7.0–26.0)
CALCIUM: 9.4 mg/dL (ref 8.4–10.4)
CO2: 28 mEq/L (ref 22–29)
Chloride: 105 mEq/L (ref 98–109)
Creatinine: 0.9 mg/dL (ref 0.6–1.1)
Glucose: 77 mg/dl (ref 70–140)
POTASSIUM: 4.2 meq/L (ref 3.5–5.1)
SODIUM: 139 meq/L (ref 136–145)
Total Bilirubin: 0.51 mg/dL (ref 0.20–1.20)
Total Protein: 7.1 g/dL (ref 6.4–8.3)

## 2015-11-28 ENCOUNTER — Ambulatory Visit (HOSPITAL_COMMUNITY): Payer: Medicaid Other

## 2015-12-02 ENCOUNTER — Other Ambulatory Visit: Payer: Self-pay | Admitting: Hematology and Oncology

## 2015-12-02 ENCOUNTER — Telehealth: Payer: Self-pay | Admitting: Hematology and Oncology

## 2015-12-02 ENCOUNTER — Other Ambulatory Visit: Payer: Self-pay | Admitting: *Deleted

## 2015-12-02 ENCOUNTER — Encounter (HOSPITAL_COMMUNITY): Payer: Self-pay

## 2015-12-02 ENCOUNTER — Ambulatory Visit (HOSPITAL_COMMUNITY)
Admission: RE | Admit: 2015-12-02 | Discharge: 2015-12-02 | Disposition: A | Discharge status: Home / Self Care | Payer: Medicaid Other | Source: Ambulatory Visit | Attending: Hematology and Oncology | Admitting: Hematology and Oncology

## 2015-12-02 ENCOUNTER — Ambulatory Visit: Payer: Medicaid Other

## 2015-12-02 DIAGNOSIS — C50412 Malignant neoplasm of upper-outer quadrant of left female breast: Secondary | ICD-10-CM | POA: Diagnosis not present

## 2015-12-02 DIAGNOSIS — C787 Secondary malignant neoplasm of liver and intrahepatic bile duct: Secondary | ICD-10-CM | POA: Diagnosis not present

## 2015-12-02 MED ORDER — IOPAMIDOL (ISOVUE-300) INJECTION 61%
100.0000 mL | Freq: Once | INTRAVENOUS | Status: AC | PRN
  Administered 2015-12-02: 100 mL via INTRAVENOUS

## 2015-12-02 NOTE — Telephone Encounter (Signed)
Due to bmdc moved 5/24 lab/fu to 12:30 pm and also moved 5/31 lab to 5/30 to coordinate with 5/30 inj and keep patient from coming in two days in a row. Spoke with patient re changes she is aware. Patient will get new schedule today.

## 2015-12-04 ENCOUNTER — Ambulatory Visit: Payer: Medicaid Other

## 2015-12-04 ENCOUNTER — Ambulatory Visit (HOSPITAL_BASED_OUTPATIENT_CLINIC_OR_DEPARTMENT_OTHER): Payer: Medicaid Other | Admitting: Hematology and Oncology

## 2015-12-04 ENCOUNTER — Telehealth: Payer: Self-pay | Admitting: Hematology and Oncology

## 2015-12-04 ENCOUNTER — Encounter: Payer: Self-pay | Admitting: *Deleted

## 2015-12-04 ENCOUNTER — Other Ambulatory Visit (HOSPITAL_BASED_OUTPATIENT_CLINIC_OR_DEPARTMENT_OTHER): Payer: Medicaid Other

## 2015-12-04 ENCOUNTER — Encounter: Payer: Self-pay | Admitting: Hematology and Oncology

## 2015-12-04 VITALS — BP 119/79 | HR 70 | Temp 98.7°F | Resp 18 | Wt 134.6 lb

## 2015-12-04 DIAGNOSIS — C50412 Malignant neoplasm of upper-outer quadrant of left female breast: Secondary | ICD-10-CM

## 2015-12-04 DIAGNOSIS — E8809 Other disorders of plasma-protein metabolism, not elsewhere classified: Secondary | ICD-10-CM

## 2015-12-04 DIAGNOSIS — D649 Anemia, unspecified: Secondary | ICD-10-CM | POA: Diagnosis not present

## 2015-12-04 LAB — CBC WITH DIFFERENTIAL/PLATELET
BASO%: 0.5 % (ref 0.0–2.0)
Basophils Absolute: 0 10*3/uL (ref 0.0–0.1)
EOS ABS: 0.1 10*3/uL (ref 0.0–0.5)
EOS%: 1.9 % (ref 0.0–7.0)
HCT: 30.9 % — ABNORMAL LOW (ref 34.8–46.6)
HGB: 9.9 g/dL — ABNORMAL LOW (ref 11.6–15.9)
LYMPH%: 44.4 % (ref 14.0–49.7)
MCH: 30.2 pg (ref 25.1–34.0)
MCHC: 31.9 g/dL (ref 31.5–36.0)
MCV: 94.6 fL (ref 79.5–101.0)
MONO#: 0.3 10*3/uL (ref 0.1–0.9)
MONO%: 10.4 % (ref 0.0–14.0)
NEUT%: 42.8 % (ref 38.4–76.8)
NEUTROS ABS: 1.3 10*3/uL — AB (ref 1.5–6.5)
Platelets: 177 10*3/uL (ref 145–400)
RBC: 3.27 10*6/uL — AB (ref 3.70–5.45)
RDW: 17.4 % — ABNORMAL HIGH (ref 11.2–14.5)
WBC: 3.1 10*3/uL — AB (ref 3.9–10.3)
lymph#: 1.4 10*3/uL (ref 0.9–3.3)

## 2015-12-04 LAB — COMPREHENSIVE METABOLIC PANEL
ALT: 18 U/L (ref 0–55)
AST: 27 U/L (ref 5–34)
Albumin: 3.8 g/dL (ref 3.5–5.0)
Alkaline Phosphatase: 90 U/L (ref 40–150)
Anion Gap: 8 mEq/L (ref 3–11)
BILIRUBIN TOTAL: 0.5 mg/dL (ref 0.20–1.20)
BUN: 7.7 mg/dL (ref 7.0–26.0)
CHLORIDE: 105 meq/L (ref 98–109)
CO2: 26 meq/L (ref 22–29)
Calcium: 9.3 mg/dL (ref 8.4–10.4)
Creatinine: 0.9 mg/dL (ref 0.6–1.1)
GLUCOSE: 110 mg/dL (ref 70–140)
POTASSIUM: 4.1 meq/L (ref 3.5–5.1)
SODIUM: 139 meq/L (ref 136–145)
TOTAL PROTEIN: 7.5 g/dL (ref 6.4–8.3)

## 2015-12-04 NOTE — Progress Notes (Signed)
Patient Care Team: Pcp Not In System as PCP - General [name], MD as Consulting Physician (General Surgery) Nicholas Lose, MD as Consulting Physician (Hematology and Oncology) Kyung Rudd, MD as Consulting Physician (Radiation Oncology)  DIAGNOSIS: Breast cancer of upper-outer quadrant of left female breast [facility])   Staging form: Breast, AJCC 7th Edition     Clinical stage from 07/17/2015: Stage IIIA (T3, N1, M0) - Unsigned       Staging comments: Staged at breast conference on 1.4.17  SUMMARY OF ONCOLOGIC HISTORY:   Breast cancer of upper-outer quadrant of left female breast ([name])   07/02/2015 Mammogram Left breast mass 5.3 x 5.2 x 2.7 cm at 1:30 position, multiple abnormal enlarged left axillary lymph nodes   07/09/2015 Initial Diagnosis Left breast biopsy 1:30: IDC grade 3, ER PR 0%, HER-2 negative ratio 1.65, Ki-67 90%; left axillary lymph node biopsy: IDC grade 3 completely replacing the lymph node ER PR 0%, HER-2 negative ratio 1.22 Ki-67 90%   07/25/2015 -  Chemotherapy Palliative chemotherapy: Carboplatin and gemcitabine day 1 and day 8 every 3 weeks ( metastatic breast cancer with extensive liver metastases and malignant recurrent ascites)   12/02/2015 Imaging CT CAP: Decrease in the liver metastases now measuring 7 x 5 cm was previously 8.4 x 9.3 cm.    CHIEF COMPLIANT: Follow-up to discuss the scans  INTERVAL HISTORY: Dawn Haynes is a 35 year old lady with above-mentioned symptoms metastatic breast cancer treatment negative as is currently on palliative chemotherapy. She completed 6 cycles of carboplatin and gemcitabine and has CT chest abdomen pelvis and is here to discuss the results. She no longer has any problems with ascites and abdominal pain. She is feeling well. She had problems with neutropenia for which she received Neupogen injections.  REVIEW OF SYSTEMS:   Constitutional: Denies fevers, chills or abnormal weight loss Eyes: Denies blurriness of vision Ears, nose,  mouth, throat, and face: Denies mucositis or sore throat Respiratory: Denies cough, dyspnea or wheezes Cardiovascular: Denies palpitation, chest discomfort Gastrointestinal:  Denies nausea, heartburn or change in bowel habits Skin: Denies abnormal skin rashes Lymphatics: Denies new lymphadenopathy or easy bruising Neurological:Denies numbness, tingling or new weaknesses Behavioral/Psych: Mood is stable, no new changes  Extremities: No lower extremity edema Breast:  denies any pain or lumps or nodules in either breasts All other systems were reviewed with the patient and are negative.  I have reviewed the past medical history, past surgical history, social history and family history with the patient and they are unchanged from previous note.  ALLERGIES:  has No Known Allergies.  MEDICATIONS:  Current Outpatient Prescriptions  Medication Sig Dispense Refill  . BORON PO Take 1 tablet by mouth 3 (three) times daily as needed (gas/flatulence).    . Cholecalciferol (VITAMIN D) 2000 units CAPS Take 2,000 Units by mouth daily.     . ondansetron (ZOFRAN) 4 MG tablet Take 1 tablet (4 mg total) by mouth every 8 (eight) hours as needed for nausea or vomiting. 30 tablet 0  . oxyCODONE (OXY IR/ROXICODONE) 5 MG immediate release tablet Take 1 tablet (5 mg total) by mouth every 4 (four) hours as needed for moderate pain. 60 tablet 0  . polyethylene glycol (MIRALAX / GLYCOLAX) packet Take 17 g by mouth daily. May increase to 34 g daily if needed 30 each 0  . Probiotic Product (SOLUBLE FIBER/PROBIOTICS PO) Take 1 tablet by mouth daily.     Marland Kitchen senna-docusate (SENOKOT-S) 8.6-50 MG tablet Take 2 tablets by mouth daily.  20 tablet 0  . vitamin B-12 (CYANOCOBALAMIN) 100 MCG tablet Take 100 mcg by mouth daily.     No current facility-administered medications for this visit.    PHYSICAL EXAMINATION: ECOG PERFORMANCE STATUS: 0 - Asymptomatic  Filed Vitals:   12/04/15 1253  BP: 119/79  Pulse: 70  Temp: 98.7  F (37.1 C)  Resp: 18   Filed Weights   12/04/15 1253  Weight: 134 lb 9.6 oz (61.054 kg)    GENERAL:alert, no distress and comfortable SKIN: skin color, texture, turgor are normal, no rashes or significant lesions EYES: normal, Conjunctiva are pink and non-injected, sclera clear OROPHARYNX:no exudate, no erythema and lips, buccal mucosa, and tongue normal  NECK: supple, thyroid normal size, non-tender, without nodularity LYMPH:  no palpable lymphadenopathy in the cervical, axillary or inguinal LUNGS: clear to auscultation and percussion with normal breathing effort HEART: regular rate & rhythm and no murmurs and no lower extremity edema ABDOMEN:abdomen soft, non-tender and normal bowel sounds MUSCULOSKELETAL:no cyanosis of digits and no clubbing  NEURO: alert & oriented x 3 with fluent speech, no focal motor/sensory deficits EXTREMITIES: No lower extremity edema  LABORATORY DATA:  I have reviewed the data as listed   Chemistry      Component Value Date/Time   NA 139 11/20/2015 0953   NA 135 08/02/2015 2242   K 4.2 11/20/2015 0953   K 3.8 08/02/2015 2242   CL 97* 08/02/2015 2242   CO2 28 11/20/2015 0953   CO2 28 08/02/2015 2242   BUN 9.5 11/20/2015 0953   BUN 10 08/02/2015 2242   CREATININE 0.9 11/20/2015 0953   CREATININE 0.44 08/02/2015 2242      Component Value Date/Time   CALCIUM 9.4 11/20/2015 0953   CALCIUM 7.9* 08/02/2015 2242   ALKPHOS 104 11/20/2015 0953   ALKPHOS 362* 07/29/2015 0605   AST 42* 11/20/2015 0953   AST 387* 07/29/2015 0605   ALT 25 11/20/2015 0953   ALT 95* 07/29/2015 0605   BILITOT 0.51 11/20/2015 0953   BILITOT 1.9* 07/29/2015 0605       Lab Results  Component Value Date   WBC 3.1* 12/04/2015   HGB 9.9* 12/04/2015   HCT 30.9* 12/04/2015   MCV 94.6 12/04/2015   PLT 177 12/04/2015   NEUTROABS 1.3* 12/04/2015     ASSESSMENT & PLAN:  Breast cancer of upper-outer quadrant of left female breast ([place]) Left breast biopsy 1:30 on  07/09/2015: IDC grade 3, ER PR 0%, HER-2 negative ratio 1.65, Ki-67 90%;  left axillary lymph node biopsy: IDC grade 3 completely replacing the lymph node ER PR 0%, HER-2 negative ratio 1.22 Ki-67 90%  Left breast mass 5.3 x 5.2 x 2.7 cm at 1:30 position, multiple abnormal enlarged left axillary lymph nodes  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  1. Normocytic anemia: Probably related to chemotherapy as well as anemia of chronic disease. We will have to monitor closely.  2. Neutropenia: With changing treatment to single agent gemcitabine, I would discontinue Neupogen injections.   Recurrent malignant ascites: Resolved. Peritoneal catheter has been removed.   Elevated liver function tests: Related to extensive liver metastases. AST levels are slowly  coming down. We will have to watch her very closely. Alkaline phosphatase and total bilirubin have normalized today her AST is 54 and ALT is 40 and alkaline phosphatase is normal at 137  Severe hypoalbuminemia: Patient started a protein drink . our dietitian had met her.   Lower extremity edema: Related to hypoalbuminemia. resolved  Palliative care  CT chest abdomen pelvis 12/02/2015: No metastases in the chest, largest confluent area right lobe of the liver 7 x 5 cm significantly smaller (was 8.4 x 9.3 cm)  Based on the continued good response by CT scan and clinical  signs, plan to give her 3 more cycles of chemotherapy now with gemcitabine alone.   Return to clinic on 12/20/2015    No orders of the defined types were placed in this encounter.   The patient has a good understanding of the overall plan. she agrees with it. she will call with any problems that may develop before the next visit here.   Rulon Eisenmenger, MD 12/04/2015

## 2015-12-04 NOTE — Telephone Encounter (Signed)
appt made and avs printed °

## 2015-12-04 NOTE — Assessment & Plan Note (Signed)
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 Her blood counts specially her CBC appeared to be reasonably steady.  1. Normocytic anemia: Probably related to chemotherapy as well as anemia of chronic disease. We will have to monitor closely.  2. Neutropenia: I added a dose of Neupogen to be given 2 days prior to each chemotherapy   Recurrent malignant ascites: Resolved. Peritoneal catheter has been removed.   Elevated liver function tests: Related to extensive liver metastases. AST levels are slowly coming down. We will have to watch her very closely. Alkaline phosphatase and total bilirubin have normalized today her AST is 54 and ALT is 40 and alkaline phosphatase is normal at 137  Severe hypoalbuminemia: Patient started a protein drink . our dietitian had met her.   Lower extremity edema: Related to hypoalbuminemia. resolved  Palliative care  CT chest abdomen pelvis 12/02/2015: No metastases in the chest, largest confluent area right lobe of the liver 7 x 5 cm significantly smaller (was 8.4 x 9.3 cm)  Based on the continued good response by CT scan and clinical signs, plan to give her 3 more  cycles of chemotherapy.   Return to clinic in 3 weeks for cycle 8 of chemotherapy.

## 2015-12-10 ENCOUNTER — Ambulatory Visit: Payer: Medicaid Other

## 2015-12-10 ENCOUNTER — Other Ambulatory Visit: Payer: Medicaid Other

## 2015-12-11 ENCOUNTER — Other Ambulatory Visit: Payer: Medicaid Other

## 2015-12-13 ENCOUNTER — Other Ambulatory Visit (HOSPITAL_BASED_OUTPATIENT_CLINIC_OR_DEPARTMENT_OTHER): Payer: Medicaid Other

## 2015-12-13 ENCOUNTER — Telehealth: Payer: Self-pay | Admitting: Hematology and Oncology

## 2015-12-13 ENCOUNTER — Other Ambulatory Visit: Payer: Self-pay | Admitting: *Deleted

## 2015-12-13 ENCOUNTER — Ambulatory Visit (HOSPITAL_BASED_OUTPATIENT_CLINIC_OR_DEPARTMENT_OTHER): Payer: Medicaid Other

## 2015-12-13 ENCOUNTER — Other Ambulatory Visit (HOSPITAL_BASED_OUTPATIENT_CLINIC_OR_DEPARTMENT_OTHER): Payer: Medicaid Other | Admitting: *Deleted

## 2015-12-13 DIAGNOSIS — C787 Secondary malignant neoplasm of liver and intrahepatic bile duct: Secondary | ICD-10-CM

## 2015-12-13 DIAGNOSIS — C50412 Malignant neoplasm of upper-outer quadrant of left female breast: Secondary | ICD-10-CM

## 2015-12-13 DIAGNOSIS — Z5111 Encounter for antineoplastic chemotherapy: Secondary | ICD-10-CM | POA: Diagnosis present

## 2015-12-13 LAB — COMPREHENSIVE METABOLIC PANEL
ALBUMIN: 3.8 g/dL (ref 3.5–5.0)
ALK PHOS: 83 U/L (ref 40–150)
ALT: 17 U/L (ref 0–55)
ANION GAP: 7 meq/L (ref 3–11)
AST: 29 U/L (ref 5–34)
BILIRUBIN TOTAL: 0.44 mg/dL (ref 0.20–1.20)
BUN: 8.4 mg/dL (ref 7.0–26.0)
CALCIUM: 9.1 mg/dL (ref 8.4–10.4)
CO2: 27 mEq/L (ref 22–29)
CREATININE: 0.9 mg/dL (ref 0.6–1.1)
Chloride: 105 mEq/L (ref 98–109)
Glucose: 103 mg/dl (ref 70–140)
Potassium: 4.3 mEq/L (ref 3.5–5.1)
Sodium: 139 mEq/L (ref 136–145)
TOTAL PROTEIN: 7.3 g/dL (ref 6.4–8.3)

## 2015-12-13 LAB — CBC WITH DIFFERENTIAL/PLATELET
BASO%: 0.5 % (ref 0.0–2.0)
Basophils Absolute: 0 10*3/uL (ref 0.0–0.1)
EOS ABS: 0 10*3/uL (ref 0.0–0.5)
EOS%: 1.2 % (ref 0.0–7.0)
HEMATOCRIT: 31.5 % — AB (ref 34.8–46.6)
HEMOGLOBIN: 10.1 g/dL — AB (ref 11.6–15.9)
LYMPH#: 1.7 10*3/uL (ref 0.9–3.3)
LYMPH%: 54.6 % — AB (ref 14.0–49.7)
MCH: 29.8 pg (ref 25.1–34.0)
MCHC: 31.9 g/dL (ref 31.5–36.0)
MCV: 93.3 fL (ref 79.5–101.0)
MONO#: 0.3 10*3/uL (ref 0.1–0.9)
MONO%: 10.7 % (ref 0.0–14.0)
NEUT%: 33 % — ABNORMAL LOW (ref 38.4–76.8)
NEUTROS ABS: 1 10*3/uL — AB (ref 1.5–6.5)
PLATELETS: 218 10*3/uL (ref 145–400)
RBC: 3.38 10*6/uL — ABNORMAL LOW (ref 3.70–5.45)
RDW: 16.2 % — AB (ref 11.2–14.5)
WBC: 3.1 10*3/uL — AB (ref 3.9–10.3)

## 2015-12-13 MED ORDER — SODIUM CHLORIDE 0.9 % IV SOLN
Freq: Once | INTRAVENOUS | Status: AC
  Administered 2015-12-13: 15:00:00 via INTRAVENOUS

## 2015-12-13 MED ORDER — PEGFILGRASTIM INJECTION 6 MG/0.6ML ~~LOC~~
6.0000 mg | PREFILLED_SYRINGE | Freq: Once | SUBCUTANEOUS | Status: DC
Start: 2015-12-16 — End: 2015-12-13

## 2015-12-13 MED ORDER — PALONOSETRON HCL INJECTION 0.25 MG/5ML
0.2500 mg | Freq: Once | INTRAVENOUS | Status: AC
  Administered 2015-12-13: 0.25 mg via INTRAVENOUS

## 2015-12-13 MED ORDER — GEMCITABINE HCL CHEMO INJECTION 1 GM/26.3ML
1300.0000 mg | Freq: Once | INTRAVENOUS | Status: AC
  Administered 2015-12-13: 1300 mg via INTRAVENOUS
  Filled 2015-12-13: qty 34.19

## 2015-12-13 MED ORDER — PALONOSETRON HCL INJECTION 0.25 MG/5ML
INTRAVENOUS | Status: AC
  Filled 2015-12-13: qty 5

## 2015-12-13 MED ORDER — HEPARIN SOD (PORK) LOCK FLUSH 100 UNIT/ML IV SOLN
500.0000 [IU] | Freq: Once | INTRAVENOUS | Status: AC | PRN
  Administered 2015-12-13: 500 [IU]
  Filled 2015-12-13: qty 5

## 2015-12-13 MED ORDER — SODIUM CHLORIDE 0.9 % IJ SOLN
10.0000 mL | INTRAMUSCULAR | Status: DC | PRN
  Administered 2015-12-13: 10 mL
  Filled 2015-12-13: qty 10

## 2015-12-13 MED ORDER — PEGFILGRASTIM 6 MG/0.6ML ~~LOC~~ PSKT
6.0000 mg | PREFILLED_SYRINGE | Freq: Once | SUBCUTANEOUS | Status: DC
  Filled 2015-12-13: qty 0.6

## 2015-12-13 MED ORDER — SODIUM CHLORIDE 0.9 % IV SOLN
10.0000 mg | Freq: Once | INTRAVENOUS | Status: AC
  Administered 2015-12-13: 10 mg via INTRAVENOUS
  Filled 2015-12-13: qty 1

## 2015-12-13 NOTE — Progress Notes (Signed)
OK to treat today despite ANC of 1.0 per Dr Jana Hakim but give pt option of taking onpro today or neulasta tomorrow.  Pt will try onpro & was given video to review & verbal teaching done also.  After nurses lunch break, pt decided she doesn't want to use the OnPro.  Message left for Dr Jana Hakim to get OK for Neulasta on Monday since it will be too late for pt to come in on Sat.

## 2015-12-13 NOTE — Telephone Encounter (Signed)
left msg confirming 6/5 inj apt per pof

## 2015-12-16 ENCOUNTER — Ambulatory Visit: Payer: Medicaid Other

## 2015-12-16 NOTE — Progress Notes (Signed)
Pt is a NO SHOW for injection appointment

## 2015-12-18 ENCOUNTER — Other Ambulatory Visit: Payer: Self-pay | Admitting: Hematology and Oncology

## 2015-12-19 NOTE — Assessment & Plan Note (Signed)
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  1. Normocytic anemia: Probably related to chemotherapy as well as anemia of chronic disease. We will have to monitor closely.  2. Neutropenia: With changing treatment to single agent gemcitabine, I would discontinue Neupogen injections.   Recurrent malignant ascites: Resolved. Peritoneal catheter has been removed.   Elevated liver function tests: Related to extensive liver metastases. AST levels are slowly coming down. We will have to watch her very closely. Alkaline phosphatase and total bilirubin have normalized today her AST is 54 and ALT is 40 and alkaline phosphatase is normal at 137  Severe hypoalbuminemia: Patient started a protein drink . our dietitian had met her.   Lower extremity edema: Related to hypoalbuminemia. resolved  Palliative care  CT chest abdomen pelvis 12/02/2015: No metastases in the chest, largest confluent area right lobe of the liver 7 x 5 cm significantly smaller (was 8.4 x 9.3 cm)  Based on the continued good  response by CT scan and clinical signs, plan to give her 3 more cycles of chemotherapy now with gemcitabine alone.   Return to clinic in 3 weeks for follow-up

## 2015-12-20 ENCOUNTER — Encounter: Payer: Self-pay | Admitting: Hematology and Oncology

## 2015-12-20 ENCOUNTER — Ambulatory Visit (HOSPITAL_BASED_OUTPATIENT_CLINIC_OR_DEPARTMENT_OTHER): Payer: Medicaid Other | Admitting: Hematology and Oncology

## 2015-12-20 ENCOUNTER — Ambulatory Visit (HOSPITAL_BASED_OUTPATIENT_CLINIC_OR_DEPARTMENT_OTHER): Payer: Medicaid Other

## 2015-12-20 ENCOUNTER — Telehealth: Payer: Self-pay | Admitting: Hematology and Oncology

## 2015-12-20 ENCOUNTER — Telehealth: Payer: Self-pay | Admitting: *Deleted

## 2015-12-20 ENCOUNTER — Other Ambulatory Visit (HOSPITAL_BASED_OUTPATIENT_CLINIC_OR_DEPARTMENT_OTHER): Payer: Medicaid Other

## 2015-12-20 VITALS — BP 105/71 | HR 56 | Temp 98.5°F | Resp 18 | Ht 62.0 in | Wt 134.3 lb

## 2015-12-20 DIAGNOSIS — R770 Abnormality of albumin: Secondary | ICD-10-CM

## 2015-12-20 DIAGNOSIS — C773 Secondary and unspecified malignant neoplasm of axilla and upper limb lymph nodes: Secondary | ICD-10-CM

## 2015-12-20 DIAGNOSIS — Z171 Estrogen receptor negative status [ER-]: Secondary | ICD-10-CM | POA: Diagnosis not present

## 2015-12-20 DIAGNOSIS — C50412 Malignant neoplasm of upper-outer quadrant of left female breast: Secondary | ICD-10-CM

## 2015-12-20 DIAGNOSIS — R180 Malignant ascites: Secondary | ICD-10-CM

## 2015-12-20 DIAGNOSIS — D701 Agranulocytosis secondary to cancer chemotherapy: Secondary | ICD-10-CM

## 2015-12-20 DIAGNOSIS — C787 Secondary malignant neoplasm of liver and intrahepatic bile duct: Secondary | ICD-10-CM | POA: Diagnosis not present

## 2015-12-20 DIAGNOSIS — D6481 Anemia due to antineoplastic chemotherapy: Secondary | ICD-10-CM | POA: Diagnosis not present

## 2015-12-20 LAB — CBC WITH DIFFERENTIAL/PLATELET
BASO%: 0.3 % (ref 0.0–2.0)
Basophils Absolute: 0 10*3/uL (ref 0.0–0.1)
EOS ABS: 0 10*3/uL (ref 0.0–0.5)
EOS%: 0.5 % (ref 0.0–7.0)
HCT: 32.6 % — ABNORMAL LOW (ref 34.8–46.6)
HEMOGLOBIN: 10.4 g/dL — AB (ref 11.6–15.9)
LYMPH%: 70.7 % — ABNORMAL HIGH (ref 14.0–49.7)
MCH: 29.6 pg (ref 25.1–34.0)
MCHC: 32 g/dL (ref 31.5–36.0)
MCV: 92.5 fL (ref 79.5–101.0)
MONO#: 0.2 10*3/uL (ref 0.1–0.9)
MONO%: 6.9 % (ref 0.0–14.0)
NEUT%: 21.6 % — ABNORMAL LOW (ref 38.4–76.8)
NEUTROS ABS: 0.5 10*3/uL — AB (ref 1.5–6.5)
PLATELETS: 179 10*3/uL (ref 145–400)
RBC: 3.52 10*6/uL — ABNORMAL LOW (ref 3.70–5.45)
RDW: 16 % — AB (ref 11.2–14.5)
WBC: 2.5 10*3/uL — AB (ref 3.9–10.3)
lymph#: 1.8 10*3/uL (ref 0.9–3.3)

## 2015-12-20 LAB — COMPREHENSIVE METABOLIC PANEL
ALK PHOS: 102 U/L (ref 40–150)
ALT: 28 U/L (ref 0–55)
ANION GAP: 7 meq/L (ref 3–11)
AST: 34 U/L (ref 5–34)
Albumin: 4 g/dL (ref 3.5–5.0)
BILIRUBIN TOTAL: 0.39 mg/dL (ref 0.20–1.20)
BUN: 7.8 mg/dL (ref 7.0–26.0)
CO2: 27 mEq/L (ref 22–29)
Calcium: 9.5 mg/dL (ref 8.4–10.4)
Chloride: 105 mEq/L (ref 98–109)
Creatinine: 0.8 mg/dL (ref 0.6–1.1)
GLUCOSE: 83 mg/dL (ref 70–140)
Potassium: 4.1 mEq/L (ref 3.5–5.1)
SODIUM: 139 meq/L (ref 136–145)
TOTAL PROTEIN: 7.9 g/dL (ref 6.4–8.3)

## 2015-12-20 MED ORDER — TBO-FILGRASTIM 480 MCG/0.8ML ~~LOC~~ SOSY
480.0000 ug | PREFILLED_SYRINGE | Freq: Once | SUBCUTANEOUS | Status: AC
  Administered 2015-12-20: 480 ug via SUBCUTANEOUS
  Filled 2015-12-20: qty 0.8

## 2015-12-20 NOTE — Progress Notes (Signed)
Patient Care Team: Pcp Not In System as PCP - General [name], MD as Consulting Physician (General Surgery) Nicholas Lose, MD as Consulting Physician (Hematology and Oncology) Kyung Rudd, MD as Consulting Physician (Radiation Oncology)  DIAGNOSIS: Breast cancer of upper-outer quadrant of left female breast [facility])   Staging form: Breast, AJCC 7th Edition     Clinical stage from 07/17/2015: Stage IIIA (T3, N1, M0) - Unsigned       Staging comments: Staged at breast conference on 1.4.17  SUMMARY OF ONCOLOGIC HISTORY:   Breast cancer of upper-outer quadrant of left female breast ([place])   07/02/2015 Mammogram Left breast mass 5.3 x 5.2 x 2.7 cm at 1:30 position, multiple abnormal enlarged left axillary lymph nodes   07/09/2015 Initial Diagnosis Left breast biopsy 1:30: IDC grade 3, ER PR 0%, HER-2 negative ratio 1.65, Ki-67 90%; left axillary lymph node biopsy: IDC grade 3 completely replacing the lymph node ER PR 0%, HER-2 negative ratio 1.22 Ki-67 90%   07/25/2015 -  Chemotherapy Palliative chemotherapy: Carboplatin and gemcitabine day 1 and day 8 every 3 weeks ( metastatic breast cancer with extensive liver metastases and malignant recurrent ascites)   12/02/2015 Imaging CT CAP: Decrease in the liver metastases now measuring 7 x 5 cm was previously 8.4 x 9.3 cm.    CHIEF COMPLIANT: Gemcitabine single agent chemotherapy  INTERVAL HISTORY: Dawn Haynes is a 35 year old with above-mentioned history metastatic breast cancer triple negative disease who had a phenomenal response to carboplatin and gemcitabine and is here today to discuss continuation of gemcitabine single agent chemotherapy as maintenance. Unfortunately her Thayer is only 500 and she cannot get chemotherapy today. She's been feeling quite well. Does not have any fevers or chills. Denies abdominal pain nausea vomiting.  REVIEW OF SYSTEMS:   Constitutional: Denies fevers, chills or abnormal weight loss Eyes: Denies blurriness of  vision Ears, nose, mouth, throat, and face: Denies mucositis or sore throat Respiratory: Denies cough, dyspnea or wheezes Cardiovascular: Denies palpitation, chest discomfort Gastrointestinal:  Denies nausea, heartburn or change in bowel habits Skin: Denies abnormal skin rashes Lymphatics: Denies new lymphadenopathy or easy bruising Neurological:Denies numbness, tingling or new weaknesses Behavioral/Psych: Mood is stable, no new changes  Extremities: No lower extremity edema Breast:  denies any pain or lumps or nodules in either breasts All other systems were reviewed with the patient and are negative.  I have reviewed the past medical history, past surgical history, social history and family history with the patient and they are unchanged from previous note.  ALLERGIES:  has No Known Allergies.  MEDICATIONS:  Current Outpatient Prescriptions  Medication Sig Dispense Refill  . BORON PO Take 1 tablet by mouth 3 (three) times daily as needed (gas/flatulence).    . Cholecalciferol (VITAMIN D) 2000 units CAPS Take 2,000 Units by mouth daily.     . ondansetron (ZOFRAN) 4 MG tablet Take 1 tablet (4 mg total) by mouth every 8 (eight) hours as needed for nausea or vomiting. 30 tablet 0  . oxyCODONE (OXY IR/ROXICODONE) 5 MG immediate release tablet Take 1 tablet (5 mg total) by mouth every 4 (four) hours as needed for moderate pain. 60 tablet 0  . polyethylene glycol (MIRALAX / GLYCOLAX) packet Take 17 g by mouth daily. May increase to 34 g daily if needed 30 each 0  . Probiotic Product (SOLUBLE FIBER/PROBIOTICS PO) Take 1 tablet by mouth daily.     Marland Kitchen senna-docusate (SENOKOT-S) 8.6-50 MG tablet Take 2 tablets by mouth daily. 20 tablet  0  . vitamin B-12 (CYANOCOBALAMIN) 100 MCG tablet Take 100 mcg by mouth daily.     Current Facility-Administered Medications  Medication Dose Route Frequency Provider Last Rate Last Dose  . Tbo-Filgrastim (GRANIX) injection 480 mcg  480 mcg Subcutaneous Once Nicholas Lose, MD        PHYSICAL EXAMINATION: ECOG PERFORMANCE STATUS: 0 - Asymptomatic  Filed Vitals:   12/20/15 1105  BP: 105/71  Pulse: 56  Temp: 98.5 F (36.9 C)  Resp: 18   Filed Weights   12/20/15 1105  Weight: 134 lb 4.8 oz (60.918 kg)    GENERAL:alert, no distress and comfortable SKIN: skin color, texture, turgor are normal, no rashes or significant lesions EYES: normal, Conjunctiva are pink and non-injected, sclera clear OROPHARYNX:no exudate, no erythema and lips, buccal mucosa, and tongue normal  NECK: supple, thyroid normal size, non-tender, without nodularity LYMPH:  no palpable lymphadenopathy in the cervical, axillary or inguinal LUNGS: clear to auscultation and percussion with normal breathing effort HEART: regular rate & rhythm and no murmurs and no lower extremity edema ABDOMEN:abdomen soft, non-tender and normal bowel sounds MUSCULOSKELETAL:no cyanosis of digits and no clubbing  NEURO: alert & oriented x 3 with fluent speech, no focal motor/sensory deficits EXTREMITIES: No lower extremity edema  LABORATORY DATA:  I have reviewed the data as listed   Chemistry      Component Value Date/Time   NA 139 12/20/2015 1033   NA 135 08/02/2015 2242   K 4.1 12/20/2015 1033   K 3.8 08/02/2015 2242   CL 97* 08/02/2015 2242   CO2 27 12/20/2015 1033   CO2 28 08/02/2015 2242   BUN 7.8 12/20/2015 1033   BUN 10 08/02/2015 2242   CREATININE 0.8 12/20/2015 1033   CREATININE 0.44 08/02/2015 2242      Component Value Date/Time   CALCIUM 9.5 12/20/2015 1033   CALCIUM 7.9* 08/02/2015 2242   ALKPHOS 102 12/20/2015 1033   ALKPHOS 362* 07/29/2015 0605   AST 34 12/20/2015 1033   AST 387* 07/29/2015 0605   ALT 28 12/20/2015 1033   ALT 95* 07/29/2015 0605   BILITOT 0.39 12/20/2015 1033   BILITOT 1.9* 07/29/2015 0605       Lab Results  Component Value Date   WBC 2.5* 12/20/2015   HGB 10.4* 12/20/2015   HCT 32.6* 12/20/2015   MCV 92.5 12/20/2015   PLT 179 12/20/2015    NEUTROABS 0.5* 12/20/2015     ASSESSMENT & PLAN:  Breast cancer of upper-outer quadrant of left female breast ([place]) 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, Ki-67 90%;  left axillary lymph node biopsy: IDC grade 3 completely replacing the lymph node ER PR 0%, HER-2 negative ratio 1.22 Ki-67 90%  Left breast mass 5.3 x 5.2 x 2.7 cm at 1:30 position, multiple abnormal enlarged left axillary lymph nodes  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  1. Normocytic anemia: Probably related to chemotherapy as well as anemia of chronic disease. We will have to monitor closely.  2. Neutropenia: I will add Neupogen 2 days before each treatment. Also we will change  her chemotherapy regimen to gemcitabine every 2 weeks starting 12/27/2015.  Recurrent malignant ascites: Resolved. Peritoneal catheter has been removed.   Elevated liver function tests: Related to extensive liver metastases. AST levels are slowly coming down. We will have to watch her very closely. Alkaline phosphatase and total bilirubin have normalized today her AST is 54 and ALT is 40 and alkaline phosphatase is normal at 137  Severe hypoalbuminemia: Patient started a protein drink . our dietitian had met her.   Lower extremity edema: Related to hypoalbuminemia. resolved  Palliative care  CT chest  abdomen pelvis 12/02/2015: No metastases in the chest, largest confluent area right lobe of the liver 7 x 5 cm significantly smaller (was 8.4 x 9.3 cm)  Return to clinic in 3 weeks for follow-up with cycle 2 gemcitabine maintenance   No orders of the defined types were placed in this encounter.   The patient has a good understanding of the overall plan. she agrees with it. she will call with any problems that may develop before the next visit here.   Rulon Eisenmenger, MD 12/20/2015

## 2015-12-20 NOTE — Telephone Encounter (Signed)
Per staff phone call and POF I have schedueld appts. Scheduler advised of appts.  JMW  

## 2015-12-20 NOTE — Patient Instructions (Signed)

## 2015-12-20 NOTE — Telephone Encounter (Signed)
per pof to sch pt appt-gave t copy of avs-MW sch trmt

## 2015-12-27 ENCOUNTER — Other Ambulatory Visit: Payer: Self-pay | Admitting: Oncology

## 2015-12-27 ENCOUNTER — Ambulatory Visit (HOSPITAL_BASED_OUTPATIENT_CLINIC_OR_DEPARTMENT_OTHER): Payer: Medicaid Other

## 2015-12-27 ENCOUNTER — Other Ambulatory Visit (HOSPITAL_BASED_OUTPATIENT_CLINIC_OR_DEPARTMENT_OTHER): Payer: Medicaid Other

## 2015-12-27 VITALS — BP 114/73 | HR 67 | Temp 98.1°F | Resp 18

## 2015-12-27 DIAGNOSIS — C50412 Malignant neoplasm of upper-outer quadrant of left female breast: Secondary | ICD-10-CM | POA: Diagnosis not present

## 2015-12-27 DIAGNOSIS — D701 Agranulocytosis secondary to cancer chemotherapy: Secondary | ICD-10-CM | POA: Diagnosis not present

## 2015-12-27 DIAGNOSIS — Z5111 Encounter for antineoplastic chemotherapy: Secondary | ICD-10-CM | POA: Diagnosis present

## 2015-12-27 LAB — COMPREHENSIVE METABOLIC PANEL
ALT: 27 U/L (ref 0–55)
AST: 35 U/L — AB (ref 5–34)
Albumin: 4 g/dL (ref 3.5–5.0)
Alkaline Phosphatase: 89 U/L (ref 40–150)
Anion Gap: 8 mEq/L (ref 3–11)
BUN: 9.6 mg/dL (ref 7.0–26.0)
CALCIUM: 9.3 mg/dL (ref 8.4–10.4)
CHLORIDE: 106 meq/L (ref 98–109)
CO2: 28 mEq/L (ref 22–29)
CREATININE: 0.9 mg/dL (ref 0.6–1.1)
EGFR: >90 mL/min/{1.73_m2} (ref >90)
GLUCOSE: 98 mg/dL (ref 70–140)
POTASSIUM: 3.8 meq/L (ref 3.5–5.1)
SODIUM: 141 meq/L (ref 136–145)
Total Bilirubin: 0.53 mg/dL (ref 0.20–1.20)
Total Protein: 7.7 g/dL (ref 6.4–8.3)

## 2015-12-27 LAB — CBC WITH DIFFERENTIAL/PLATELET
BASO%: 0.5 % (ref 0.0–2.0)
Basophils Absolute: 0 10*3/uL (ref 0.0–0.1)
EOS%: 0.7 % (ref 0.0–7.0)
Eosinophils Absolute: 0 10*3/uL (ref 0.0–0.5)
HEMATOCRIT: 33.6 % — AB (ref 34.8–46.6)
HEMOGLOBIN: 10.6 g/dL — AB (ref 11.6–15.9)
LYMPH#: 1.7 10*3/uL (ref 0.9–3.3)
LYMPH%: 56.1 % — ABNORMAL HIGH (ref 14.0–49.7)
MCH: 29.3 pg (ref 25.1–34.0)
MCHC: 31.6 g/dL (ref 31.5–36.0)
MCV: 92.5 fL (ref 79.5–101.0)
MONO#: 0.4 10*3/uL (ref 0.1–0.9)
MONO%: 12.6 % (ref 0.0–14.0)
NEUT#: 0.9 10*3/uL — ABNORMAL LOW (ref 1.5–6.5)
NEUT%: 30.1 % — ABNORMAL LOW (ref 38.4–76.8)
Platelets: 168 10*3/uL (ref 145–400)
RBC: 3.63 10*6/uL — ABNORMAL LOW (ref 3.70–5.45)
RDW: 16.3 % — AB (ref 11.2–14.5)
WBC: 3 10*3/uL — ABNORMAL LOW (ref 3.9–10.3)

## 2015-12-27 MED ORDER — PROCHLORPERAZINE EDISYLATE 5 MG/ML IJ SOLN
INTRAMUSCULAR | Status: AC
  Filled 2015-12-27: qty 2

## 2015-12-27 MED ORDER — PROCHLORPERAZINE MALEATE 10 MG PO TABS: 10.0000 mg | ORAL_TABLET | Freq: Once | ORAL | Status: DC

## 2015-12-27 MED ORDER — SODIUM CHLORIDE 0.9 % IV SOLN
Freq: Once | INTRAVENOUS | Status: AC
  Administered 2015-12-27: 12:00:00 via INTRAVENOUS

## 2015-12-27 MED ORDER — SODIUM CHLORIDE 0.9 % IV SOLN
1300.0000 mg | Freq: Once | INTRAVENOUS | Status: AC
  Administered 2015-12-27: 1300 mg via INTRAVENOUS
  Filled 2015-12-27: qty 34.19

## 2015-12-27 MED ORDER — PROCHLORPERAZINE EDISYLATE 5 MG/ML IJ SOLN
10.0000 mg | Freq: Once | INTRAMUSCULAR | Status: AC
  Administered 2015-12-27: 10 mg via INTRAVENOUS

## 2015-12-27 MED ORDER — SODIUM CHLORIDE 0.9% FLUSH
10.0000 mL | INTRAVENOUS | Status: DC | PRN
  Administered 2015-12-27: 10 mL
  Filled 2015-12-27: qty 10

## 2015-12-27 MED ORDER — PROCHLORPERAZINE MALEATE 10 MG PO TABS
ORAL_TABLET | ORAL | Status: AC
  Filled 2015-12-27: qty 1

## 2015-12-27 MED ORDER — PEGFILGRASTIM 6 MG/0.6ML ~~LOC~~ PSKT
6.0000 mg | PREFILLED_SYRINGE | Freq: Once | SUBCUTANEOUS | Status: AC
  Administered 2015-12-27: 6 mg via SUBCUTANEOUS
  Filled 2015-12-27: qty 0.6

## 2015-12-27 MED ORDER — HEPARIN SOD (PORK) LOCK FLUSH 100 UNIT/ML IV SOLN
500.0000 [IU] | Freq: Once | INTRAVENOUS | Status: AC | PRN
  Administered 2015-12-27: 500 [IU]
  Filled 2015-12-27: qty 5

## 2015-12-27 NOTE — Patient Instructions (Signed)
[facility] Discharge Instructions for Patients Receiving Chemotherapy  Today you received the following chemotherapy agents Gemzar.  To help prevent nausea and vomiting after your treatment, we encourage you to take your nausea medication.   If you develop nausea and vomiting that is not controlled by your nausea medication, call the clinic.   BELOW ARE SYMPTOMS THAT SHOULD BE REPORTED IMMEDIATELY:  *FEVER GREATER THAN 100.5 F  *CHILLS WITH OR WITHOUT FEVER  NAUSEA AND VOMITING THAT IS NOT CONTROLLED WITH YOUR NAUSEA MEDICATION  *UNUSUAL SHORTNESS OF BREATH  *UNUSUAL BRUISING OR BLEEDING  TENDERNESS IN MOUTH AND THROAT WITH OR WITHOUT PRESENCE OF ULCERS  *URINARY PROBLEMS  *BOWEL PROBLEMS  UNUSUAL RASH Items with * indicate a potential emergency and should be followed up as soon as possible.  Feel free to call the clinic you have any questions or concerns. The clinic phone number is [phone].  Please show the CHEMO ALERT CARD at check-in to the Emergency Department and triage nurse.   

## 2015-12-27 NOTE — Progress Notes (Unsigned)
Was asked by infusion to review the case of Ms. woode, who has metastatic breast cancer, currently being maintained on Gemzar alone.  She received a dose of Gemzar 12/13/2015, but could not be treated last week because of low counts. She was givenx at that time, but her [place] today is still 0.9.  I think it would be safe to go ahead and treat her today if we go ahead and give her OnPro. This means there next chemotherapy will be in 2 weeks and it may be that this patient will need to be treated every 2 weeks instead of weekly.  Currently she does not have any follow-up appointment with Dr. Payton Mccallum. I have placed an appointment for her with him on June 27 so he can review her treatment course and plan.

## 2016-01-03 ENCOUNTER — Ambulatory Visit: Payer: Medicaid Other

## 2016-01-03 ENCOUNTER — Other Ambulatory Visit: Payer: Medicaid Other

## 2016-01-07 ENCOUNTER — Other Ambulatory Visit (HOSPITAL_BASED_OUTPATIENT_CLINIC_OR_DEPARTMENT_OTHER): Payer: Medicaid Other

## 2016-01-07 ENCOUNTER — Encounter: Payer: Self-pay | Admitting: Hematology and Oncology

## 2016-01-07 ENCOUNTER — Telehealth: Payer: Self-pay | Admitting: Hematology and Oncology

## 2016-01-07 ENCOUNTER — Ambulatory Visit (HOSPITAL_BASED_OUTPATIENT_CLINIC_OR_DEPARTMENT_OTHER): Payer: Medicaid Other | Admitting: Hematology and Oncology

## 2016-01-07 VITALS — BP 101/67 | HR 62 | Temp 98.6°F | Resp 18 | Wt 132.1 lb

## 2016-01-07 DIAGNOSIS — R945 Abnormal results of liver function studies: Secondary | ICD-10-CM | POA: Diagnosis not present

## 2016-01-07 DIAGNOSIS — C50412 Malignant neoplasm of upper-outer quadrant of left female breast: Secondary | ICD-10-CM

## 2016-01-07 DIAGNOSIS — D701 Agranulocytosis secondary to cancer chemotherapy: Secondary | ICD-10-CM | POA: Diagnosis not present

## 2016-01-07 DIAGNOSIS — C787 Secondary malignant neoplasm of liver and intrahepatic bile duct: Secondary | ICD-10-CM

## 2016-01-07 DIAGNOSIS — R770 Abnormality of albumin: Secondary | ICD-10-CM

## 2016-01-07 DIAGNOSIS — D6481 Anemia due to antineoplastic chemotherapy: Secondary | ICD-10-CM

## 2016-01-07 LAB — COMPREHENSIVE METABOLIC PANEL
ALBUMIN: 4 g/dL (ref 3.5–5.0)
ALK PHOS: 158 U/L — AB (ref 40–150)
ALT: 31 U/L (ref 0–55)
AST: 34 U/L (ref 5–34)
Anion Gap: 7 mEq/L (ref 3–11)
BUN: 9.7 mg/dL (ref 7.0–26.0)
CHLORIDE: 103 meq/L (ref 98–109)
CO2: 28 mEq/L (ref 22–29)
Calcium: 9.4 mg/dL (ref 8.4–10.4)
Creatinine: 0.9 mg/dL (ref 0.6–1.1)
GLUCOSE: 89 mg/dL (ref 70–140)
POTASSIUM: 4.3 meq/L (ref 3.5–5.1)
SODIUM: 138 meq/L (ref 136–145)
Total Bilirubin: <0.3 mg/dL (ref 0.20–1.20)
Total Protein: 7.5 g/dL (ref 6.4–8.3)

## 2016-01-07 LAB — CBC WITH DIFFERENTIAL/PLATELET
BASO%: 0.1 % (ref 0.0–2.0)
BASOS ABS: 0 10*3/uL (ref 0.0–0.1)
EOS ABS: 0 10*3/uL (ref 0.0–0.5)
EOS%: 0.2 % (ref 0.0–7.0)
HCT: 32.5 % — ABNORMAL LOW (ref 34.8–46.6)
HEMOGLOBIN: 10.3 g/dL — AB (ref 11.6–15.9)
LYMPH%: 25.6 % (ref 14.0–49.7)
MCH: 29.8 pg (ref 25.1–34.0)
MCHC: 31.7 g/dL (ref 31.5–36.0)
MCV: 93.9 fL (ref 79.5–101.0)
MONO#: 1 10*3/uL — AB (ref 0.1–0.9)
MONO%: 9.4 % (ref 0.0–14.0)
NEUT%: 64.7 % (ref 38.4–76.8)
NEUTROS ABS: 6.9 10*3/uL — AB (ref 1.5–6.5)
Platelets: 125 10*3/uL — ABNORMAL LOW (ref 145–400)
RBC: 3.46 10*6/uL — AB (ref 3.70–5.45)
RDW: 16.4 % — AB (ref 11.2–14.5)
WBC: 10.6 10*3/uL — AB (ref 3.9–10.3)
lymph#: 2.7 10*3/uL (ref 0.9–3.3)

## 2016-01-07 NOTE — Progress Notes (Signed)
Patient Care Team: Pcp Not In System as PCP - General [name], MD as Consulting Physician (General Surgery) Nicholas Lose, MD as Consulting Physician (Hematology and Oncology) Kyung Rudd, MD as Consulting Physician (Radiation Oncology)  DIAGNOSIS: Breast cancer of upper-outer quadrant of left female breast [facility])   Staging form: Breast, AJCC 7th Edition     Clinical stage from 07/17/2015: Stage IIIA (T3, N1, M0) - Unsigned       Staging comments: Staged at breast conference on 1.4.17  SUMMARY OF ONCOLOGIC HISTORY:   Breast cancer of upper-outer quadrant of left female breast ([place])   07/02/2015 Mammogram Left breast mass 5.3 x 5.2 x 2.7 cm at 1:30 position, multiple abnormal enlarged left axillary lymph nodes   07/09/2015 Initial Diagnosis Left breast biopsy 1:30: IDC grade 3, ER PR 0%, HER-2 negative ratio 1.65, Ki-67 90%; left axillary lymph node biopsy: IDC grade 3 completely replacing the lymph node ER PR 0%, HER-2 negative ratio 1.22 Ki-67 90%   07/25/2015 -  Chemotherapy Palliative chemotherapy: Carboplatin and gemcitabine day 1 and day 8 every 3 weeks ( metastatic breast cancer with extensive liver metastases and malignant recurrent ascites)   12/02/2015 Imaging CT CAP: Decrease in the liver metastases now measuring 7 x 5 cm was previously 8.4 x 9.3 cm.    CHIEF COMPLIANT:  follow-up and gemcitabine maintenance every other week  INTERVAL HISTORY: Dawn Haynes is a 35 year old with above-mentioned history of left breast cancer metastatic disease currently on Palliative chemotherapy with single agent gemcitabine. We changed her regimen to every other week because of neutropenia issues. She tolerates chemotherapy extremely well. She denies any nausea or vomiting. Denies any abdominal pain or diarrhea. Denies any fevers or chills.  REVIEW OF SYSTEMS:   Constitutional: Denies fevers, chills or abnormal weight loss Eyes: Denies blurriness of vision Ears, nose, mouth, throat, and face:  Denies mucositis or sore throat Respiratory: Denies cough, dyspnea or wheezes Cardiovascular: Denies palpitation, chest discomfort Gastrointestinal:  Denies nausea, heartburn or change in bowel habits Skin: Denies abnormal skin rashes Lymphatics: Denies new lymphadenopathy or easy bruising Neurological:Denies numbness, tingling or new weaknesses Behavioral/Psych: Mood is stable, no new changes  Extremities: No lower extremity edema All other systems were reviewed with the patient and are negative.  I have reviewed the past medical history, past surgical history, social history and family history with the patient and they are unchanged from previous note.  ALLERGIES:  has No Known Allergies.  MEDICATIONS:  Current Outpatient Prescriptions  Medication Sig Dispense Refill  . BORON PO Take 1 tablet by mouth 3 (three) times daily as needed (gas/flatulence).    . Cholecalciferol (VITAMIN D) 2000 units CAPS Take 2,000 Units by mouth daily.     . ondansetron (ZOFRAN) 4 MG tablet Take 1 tablet (4 mg total) by mouth every 8 (eight) hours as needed for nausea or vomiting. 30 tablet 0  . oxyCODONE (OXY IR/ROXICODONE) 5 MG immediate release tablet Take 1 tablet (5 mg total) by mouth every 4 (four) hours as needed for moderate pain. 60 tablet 0  . polyethylene glycol (MIRALAX / GLYCOLAX) packet Take 17 g by mouth daily. May increase to 34 g daily if needed 30 each 0  . Probiotic Product (SOLUBLE FIBER/PROBIOTICS PO) Take 1 tablet by mouth daily.     Marland Kitchen senna-docusate (SENOKOT-S) 8.6-50 MG tablet Take 2 tablets by mouth daily. 20 tablet 0  . vitamin B-12 (CYANOCOBALAMIN) 100 MCG tablet Take 100 mcg by mouth daily.  No current facility-administered medications for this visit.    PHYSICAL EXAMINATION: ECOG PERFORMANCE STATUS: 0 - Asymptomatic  Filed Vitals:   01/07/16 1356  BP: 101/67  Pulse: 62  Temp: 98.6 F (37 C)  Resp: 18   Filed Weights   01/07/16 1356  Weight: 132 lb 1.6 oz (59.92  kg)    GENERAL:alert, no distress and comfortable SKIN: skin color, texture, turgor are normal, no rashes or significant lesions EYES: normal, Conjunctiva are pink and non-injected, sclera clear OROPHARYNX:no exudate, no erythema and lips, buccal mucosa, and tongue normal  NECK: supple, thyroid normal size, non-tender, without nodularity LYMPH:  no palpable lymphadenopathy in the cervical, axillary or inguinal LUNGS: clear to auscultation and percussion with normal breathing effort HEART: regular rate & rhythm and no murmurs and no lower extremity edema ABDOMEN:abdomen soft, non-tender and normal bowel sounds MUSCULOSKELETAL:no cyanosis of digits and no clubbing  NEURO: alert & oriented x 3 with fluent speech, no focal motor/sensory deficits EXTREMITIES: No lower extremity edema  LABORATORY DATA:  I have reviewed the data as listed   Chemistry      Component Value Date/Time   NA 138 01/07/2016 1343   NA 135 08/02/2015 2242   K 4.3 01/07/2016 1343   K 3.8 08/02/2015 2242   CL 97* 08/02/2015 2242   CO2 28 01/07/2016 1343   CO2 28 08/02/2015 2242   BUN 9.7 01/07/2016 1343   BUN 10 08/02/2015 2242   CREATININE 0.9 01/07/2016 1343   CREATININE 0.44 08/02/2015 2242      Component Value Date/Time   CALCIUM 9.4 01/07/2016 1343   CALCIUM 7.9* 08/02/2015 2242   ALKPHOS 158* 01/07/2016 1343   ALKPHOS 362* 07/29/2015 0605   AST 34 01/07/2016 1343   AST 387* 07/29/2015 0605   ALT 31 01/07/2016 1343   ALT 95* 07/29/2015 0605   BILITOT <0.30 01/07/2016 1343   BILITOT 1.9* 07/29/2015 0605       Lab Results  Component Value Date   WBC 10.6* 01/07/2016   HGB 10.3* 01/07/2016   HCT 32.5* 01/07/2016   MCV 93.9 01/07/2016   PLT 125* 01/07/2016   NEUTROABS 6.9* 01/07/2016   ASSESSMENT & PLAN:  Breast cancer of upper-outer quadrant of left female breast (HCC) 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, Ki-67 90%;  left axillary lymph  node biopsy: IDC grade 3 completely replacing the lymph node ER PR 0%, HER-2 negative ratio 1.22 Ki-67 90%  Left breast mass 5.3 x 5.2 x 2.7 cm at 1:30 position, multiple abnormal enlarged left axillary lymph nodes  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  1. Normocytic anemia: Probably related to chemotherapy as well as anemia of chronic disease. We will have to monitor closely.  2. Neutropenia: We changed her chemotherapy regimen to gemcitabine every 2 weeks starting 12/27/2015. She gets Neulasta with each injection  Recurrent malignant ascites: Resolved. Peritoneal catheter has been removed.   Elevated liver function tests: Related to extensive liver metastases. AST levels are slowly coming down. We will have to watch her very closely. Alkaline phosphatase and total bilirubin have  normalized today her AST is 54 and ALT is 40 and alkaline phosphatase is normal at 137  Severe hypoalbuminemia: Patient started a protein drink . our dietitian had met her.   Lower extremity edema: Related to hypoalbuminemia. resolved  Palliative care  CT chest abdomen pelvis 12/02/2015: No metastases in the chest, largest confluent area right lobe of the liver 7 x 5 cm significantly smaller (was 8.4 x 9.3 cm)  Return to clinic in 4 weeks for follow-up and she will return every 2 weeks for gemcitabine maintenance with Neupogen  given prior to each treatment.   No orders of the defined types were placed in this encounter.   The patient has a good understanding of the overall plan. she agrees with it. she will call with any problems that may develop before the next visit here.   Rulon Eisenmenger, MD 01/07/2016

## 2016-01-07 NOTE — Assessment & Plan Note (Signed)
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  1. Normocytic anemia: Probably related to chemotherapy as well as anemia of chronic disease. We will have to monitor closely.  2. Neutropenia: I will add Neupogen 2 days before each treatment. Also we will change her chemotherapy regimen to gemcitabine every 2 weeks starting 12/27/2015.  Recurrent malignant ascites: Resolved. Peritoneal catheter has been removed.   Elevated liver function tests: Related to extensive liver metastases. AST levels are slowly coming down. We will have to watch her very closely. Alkaline phosphatase and total bilirubin have normalized today her AST is 54 and ALT is 40 and alkaline phosphatase is normal at 137  Severe hypoalbuminemia: Patient started a protein drink . our dietitian had met her.   Lower extremity edema: Related to hypoalbuminemia. resolved  Palliative care  CT chest abdomen pelvis 12/02/2015: No metastases in the chest, largest confluent area right lobe of the liver 7 x 5 cm significantly  smaller (was 8.4 x 9.3 cm)  Return to clinic in 4 weeks for follow-up and she will return every 2 weeks for gemcitabine maintenance with Neupogen given prior to each treatment.

## 2016-01-07 NOTE — Telephone Encounter (Signed)
appt made per 6/27 pof

## 2016-01-08 ENCOUNTER — Ambulatory Visit: Payer: Medicaid Other

## 2016-01-10 ENCOUNTER — Ambulatory Visit (HOSPITAL_BASED_OUTPATIENT_CLINIC_OR_DEPARTMENT_OTHER): Payer: Medicaid Other

## 2016-01-10 ENCOUNTER — Other Ambulatory Visit: Payer: Medicaid Other

## 2016-01-10 DIAGNOSIS — Z5111 Encounter for antineoplastic chemotherapy: Secondary | ICD-10-CM | POA: Diagnosis not present

## 2016-01-10 DIAGNOSIS — D701 Agranulocytosis secondary to cancer chemotherapy: Secondary | ICD-10-CM | POA: Diagnosis not present

## 2016-01-10 DIAGNOSIS — C50412 Malignant neoplasm of upper-outer quadrant of left female breast: Secondary | ICD-10-CM

## 2016-01-10 MED ORDER — PROCHLORPERAZINE MALEATE 10 MG PO TABS
10.0000 mg | ORAL_TABLET | Freq: Once | ORAL | Status: AC
  Administered 2016-01-10: 10 mg via ORAL

## 2016-01-10 MED ORDER — PROCHLORPERAZINE MALEATE 10 MG PO TABS
ORAL_TABLET | ORAL | Status: AC
  Filled 2016-01-10: qty 1

## 2016-01-10 MED ORDER — SODIUM CHLORIDE 0.9% FLUSH
10.0000 mL | INTRAVENOUS | Status: DC | PRN
  Administered 2016-01-10: 10 mL
  Filled 2016-01-10: qty 10

## 2016-01-10 MED ORDER — SODIUM CHLORIDE 0.9 % IV SOLN
1300.0000 mg | Freq: Once | INTRAVENOUS | Status: AC
  Administered 2016-01-10: 1300 mg via INTRAVENOUS
  Filled 2016-01-10: qty 34.19

## 2016-01-10 MED ORDER — PEGFILGRASTIM 6 MG/0.6ML ~~LOC~~ PSKT
6.0000 mg | PREFILLED_SYRINGE | Freq: Once | SUBCUTANEOUS | Status: AC
  Administered 2016-01-10: 6 mg via SUBCUTANEOUS
  Filled 2016-01-10: qty 0.6

## 2016-01-10 MED ORDER — HEPARIN SOD (PORK) LOCK FLUSH 100 UNIT/ML IV SOLN
500.0000 [IU] | Freq: Once | INTRAVENOUS | Status: AC | PRN
  Administered 2016-01-10: 500 [IU]
  Filled 2016-01-10: qty 5

## 2016-01-10 MED ORDER — SODIUM CHLORIDE 0.9 % IV SOLN
Freq: Once | INTRAVENOUS | Status: AC
  Administered 2016-01-10: 12:00:00 via INTRAVENOUS

## 2016-01-10 NOTE — Patient Instructions (Signed)
[facility] Discharge Instructions for Patients Receiving Chemotherapy  Today you received the following chemotherapy agents Gemzar.  To help prevent nausea and vomiting after your treatment, we encourage you to take your nausea medication.   If you develop nausea and vomiting that is not controlled by your nausea medication, call the clinic.   BELOW ARE SYMPTOMS THAT SHOULD BE REPORTED IMMEDIATELY:  *FEVER GREATER THAN 100.5 F  *CHILLS WITH OR WITHOUT FEVER  NAUSEA AND VOMITING THAT IS NOT CONTROLLED WITH YOUR NAUSEA MEDICATION  *UNUSUAL SHORTNESS OF BREATH  *UNUSUAL BRUISING OR BLEEDING  TENDERNESS IN MOUTH AND THROAT WITH OR WITHOUT PRESENCE OF ULCERS  *URINARY PROBLEMS  *BOWEL PROBLEMS  UNUSUAL RASH Items with * indicate a potential emergency and should be followed up as soon as possible.  Feel free to call the clinic you have any questions or concerns. The clinic phone number is [phone].  Please show the CHEMO ALERT CARD at check-in to the Emergency Department and triage nurse.   

## 2016-01-14 ENCOUNTER — Other Ambulatory Visit: Payer: Self-pay | Admitting: Oncology

## 2016-01-22 ENCOUNTER — Ambulatory Visit: Payer: Medicaid Other

## 2016-01-24 ENCOUNTER — Other Ambulatory Visit: Payer: Medicaid Other

## 2016-01-24 ENCOUNTER — Other Ambulatory Visit (HOSPITAL_BASED_OUTPATIENT_CLINIC_OR_DEPARTMENT_OTHER): Payer: Medicaid Other

## 2016-01-24 ENCOUNTER — Ambulatory Visit: Payer: Medicaid Other

## 2016-01-24 ENCOUNTER — Ambulatory Visit (HOSPITAL_BASED_OUTPATIENT_CLINIC_OR_DEPARTMENT_OTHER): Payer: Medicaid Other

## 2016-01-24 VITALS — BP 99/66 | HR 57 | Temp 98.6°F | Resp 17

## 2016-01-24 DIAGNOSIS — Z5111 Encounter for antineoplastic chemotherapy: Secondary | ICD-10-CM | POA: Diagnosis not present

## 2016-01-24 DIAGNOSIS — C50412 Malignant neoplasm of upper-outer quadrant of left female breast: Secondary | ICD-10-CM

## 2016-01-24 DIAGNOSIS — D701 Agranulocytosis secondary to cancer chemotherapy: Secondary | ICD-10-CM | POA: Diagnosis not present

## 2016-01-24 LAB — CBC WITH DIFFERENTIAL/PLATELET
BASO%: 0.3 % (ref 0.0–2.0)
BASOS ABS: 0 10*3/uL (ref 0.0–0.1)
EOS ABS: 0 10*3/uL (ref 0.0–0.5)
EOS%: 0.9 % (ref 0.0–7.0)
HCT: 33.2 % — ABNORMAL LOW (ref 34.8–46.6)
HEMOGLOBIN: 10.7 g/dL — AB (ref 11.6–15.9)
LYMPH%: 42 % (ref 14.0–49.7)
MCH: 29.8 pg (ref 25.1–34.0)
MCHC: 32.2 g/dL (ref 31.5–36.0)
MCV: 92.6 fL (ref 79.5–101.0)
MONO#: 0.5 10*3/uL (ref 0.1–0.9)
MONO%: 11.6 % (ref 0.0–14.0)
NEUT#: 2 10*3/uL (ref 1.5–6.5)
NEUT%: 45.2 % (ref 38.4–76.8)
Platelets: 144 10*3/uL — ABNORMAL LOW (ref 145–400)
RBC: 3.59 10*6/uL — ABNORMAL LOW (ref 3.70–5.45)
RDW: 19.4 % — AB (ref 11.2–14.5)
WBC: 4.4 10*3/uL (ref 3.9–10.3)
lymph#: 1.9 10*3/uL (ref 0.9–3.3)

## 2016-01-24 LAB — COMPREHENSIVE METABOLIC PANEL
ALBUMIN: 4 g/dL (ref 3.5–5.0)
ALK PHOS: 146 U/L (ref 40–150)
ALT: 24 U/L (ref 0–55)
AST: 28 U/L (ref 5–34)
Anion Gap: 9 mEq/L (ref 3–11)
BUN: 14 mg/dL (ref 7.0–26.0)
CALCIUM: 9.3 mg/dL (ref 8.4–10.4)
CO2: 26 mEq/L (ref 22–29)
Chloride: 104 mEq/L (ref 98–109)
Creatinine: 0.8 mg/dL (ref 0.6–1.1)
Glucose: 83 mg/dl (ref 70–140)
POTASSIUM: 4 meq/L (ref 3.5–5.1)
SODIUM: 139 meq/L (ref 136–145)
Total Bilirubin: 0.61 mg/dL (ref 0.20–1.20)
Total Protein: 7.5 g/dL (ref 6.4–8.3)

## 2016-01-24 MED ORDER — SODIUM CHLORIDE 0.9 % IV SOLN
1300.0000 mg | Freq: Once | INTRAVENOUS | Status: AC
  Administered 2016-01-24: 1300 mg via INTRAVENOUS
  Filled 2016-01-24: qty 34.19

## 2016-01-24 MED ORDER — HEPARIN SOD (PORK) LOCK FLUSH 100 UNIT/ML IV SOLN
500.0000 [IU] | Freq: Once | INTRAVENOUS | Status: AC | PRN
  Administered 2016-01-24: 500 [IU]
  Filled 2016-01-24: qty 5

## 2016-01-24 MED ORDER — PROCHLORPERAZINE MALEATE 10 MG PO TABS
ORAL_TABLET | ORAL | Status: AC
Start: 2016-01-24 — End: 2016-01-24
  Filled 2016-01-24: qty 1

## 2016-01-24 MED ORDER — PROCHLORPERAZINE MALEATE 10 MG PO TABS
10.0000 mg | ORAL_TABLET | Freq: Once | ORAL | Status: AC
  Administered 2016-01-24: 10 mg via ORAL

## 2016-01-24 MED ORDER — SODIUM CHLORIDE 0.9 % IV SOLN
Freq: Once | INTRAVENOUS | Status: AC
  Administered 2016-01-24: 12:00:00 via INTRAVENOUS

## 2016-01-24 MED ORDER — SODIUM CHLORIDE 0.9% FLUSH
10.0000 mL | INTRAVENOUS | Status: DC | PRN
  Administered 2016-01-24: 10 mL
  Filled 2016-01-24: qty 10

## 2016-01-24 MED ORDER — PEGFILGRASTIM 6 MG/0.6ML ~~LOC~~ PSKT
6.0000 mg | PREFILLED_SYRINGE | Freq: Once | SUBCUTANEOUS | Status: AC
  Administered 2016-01-24: 6 mg via SUBCUTANEOUS
  Filled 2016-01-24: qty 0.6

## 2016-01-24 NOTE — Patient Instructions (Signed)
[facility] Discharge Instructions for Patients Receiving Chemotherapy  Today you received the following chemotherapy agents Gemzar  To help prevent nausea and vomiting after your treatment, we encourage you to take your nausea medication as prescribed   If you develop nausea and vomiting that is not controlled by your nausea medication, call the clinic.   BELOW ARE SYMPTOMS THAT SHOULD BE REPORTED IMMEDIATELY:  *FEVER GREATER THAN 100.5 F  *CHILLS WITH OR WITHOUT FEVER  NAUSEA AND VOMITING THAT IS NOT CONTROLLED WITH YOUR NAUSEA MEDICATION  *UNUSUAL SHORTNESS OF BREATH  *UNUSUAL BRUISING OR BLEEDING  TENDERNESS IN MOUTH AND THROAT WITH OR WITHOUT PRESENCE OF ULCERS  *URINARY PROBLEMS  *BOWEL PROBLEMS  UNUSUAL RASH Items with * indicate a potential emergency and should be followed up as soon as possible.  Feel free to call the clinic you have any questions or concerns. The clinic phone number is [phone].  Please show the CHEMO ALERT CARD at check-in to the Emergency Department and triage nurse.   

## 2016-01-28 IMAGING — US US PARACENTESIS
1 series · 6 of 6 positions shown · non-contrast
Comparison: Prior paracentesis on 07/20/15

MEDICATIONS:
None.

COMPLICATIONS:
None immediate

INDICATION: Metastatic breast cancer, recurrent ascites. Request made for
therapeutic paracentesis.

EXAM:
ULTRASOUND-GUIDED THERAPEUTIC PARACENTESIS
TECHNIQUE: Informed written consent was obtained from the patient after a
discussion of the risks, benefits and alternatives to treatment. A
timeout was performed prior to the initiation of the procedure.

[Series 1: us paracentesis · 0.26mm/px · 6 of 6 slices shown]
[im 1/6]
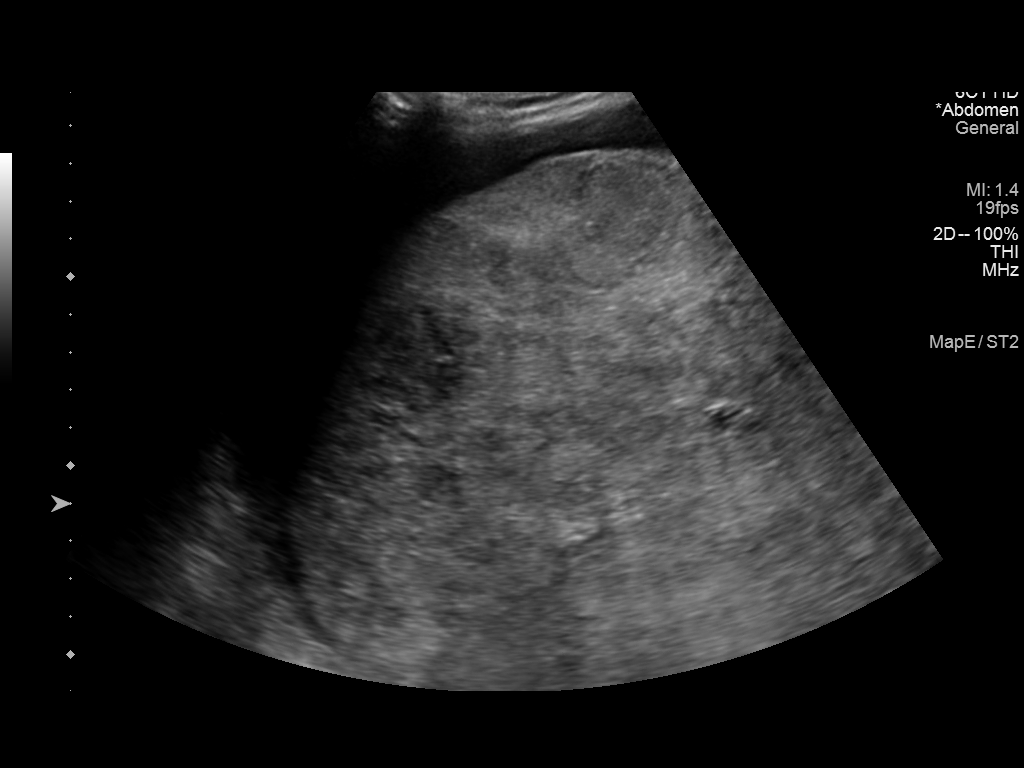
[im 2/6]
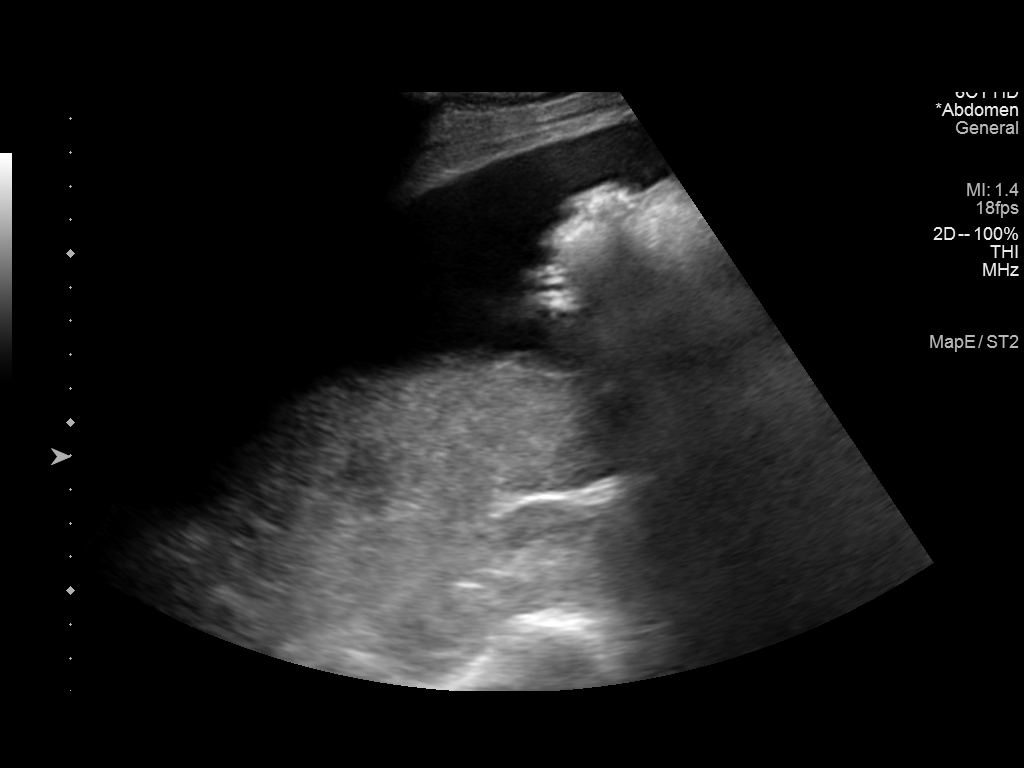
[im 3/6]
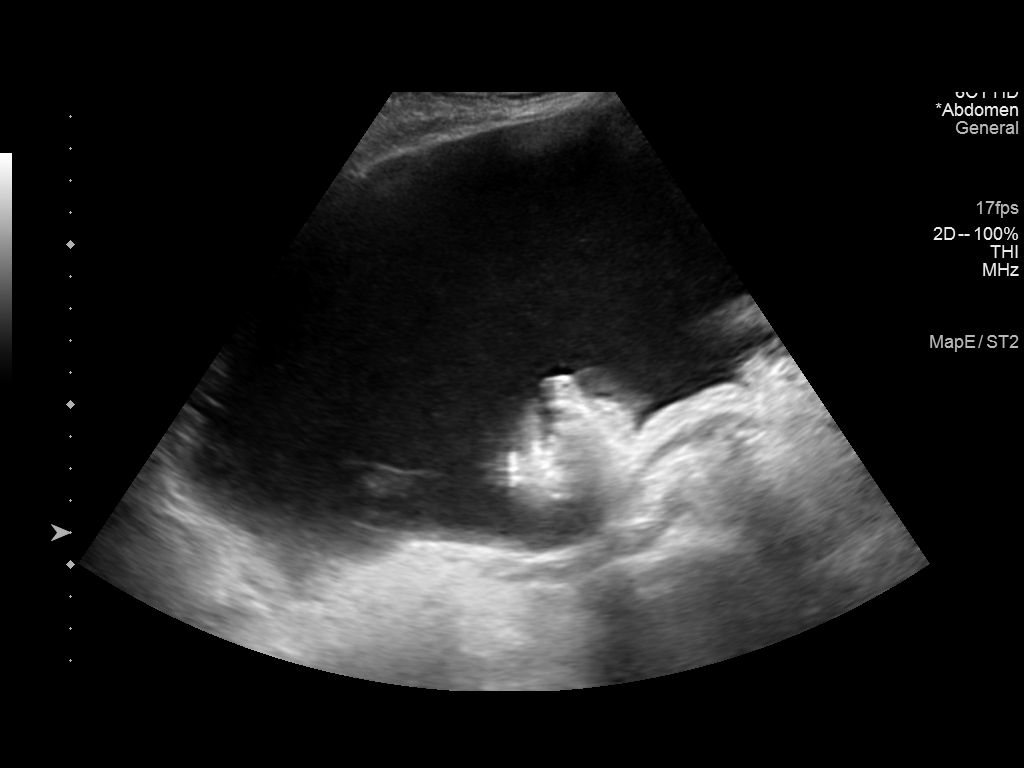
[im 4/6]
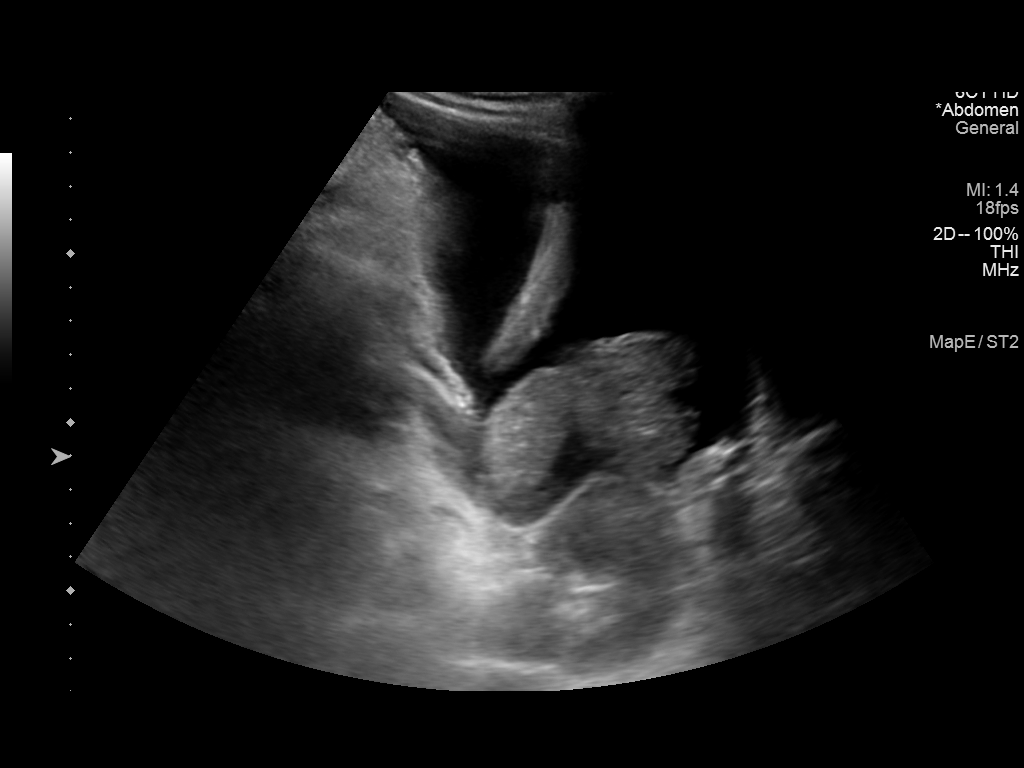
[im 5/6]
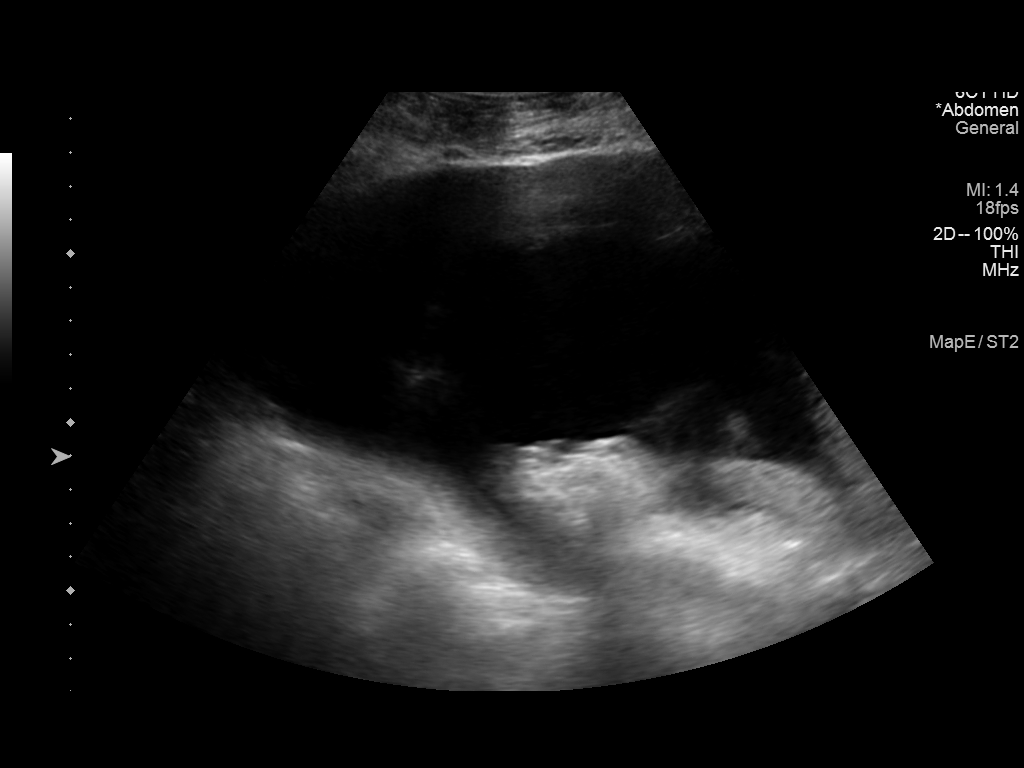
[im 6/6]
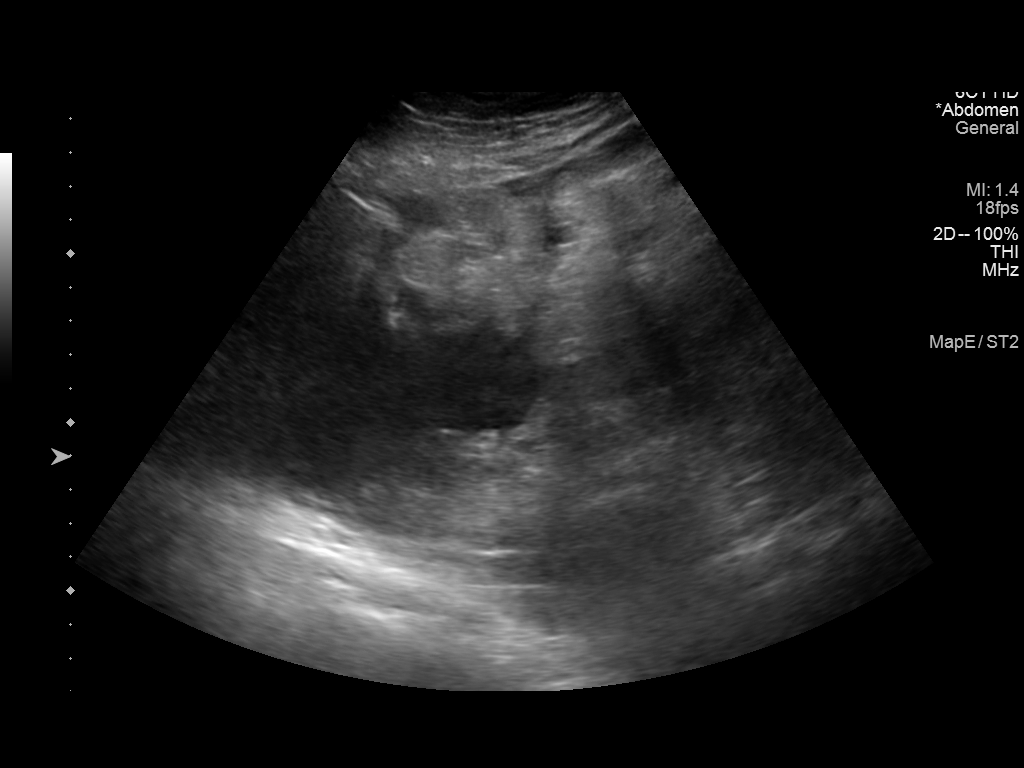

[6 of 6 positions shown; findings below may reference images not displayed]

Initial ultrasound scanning demonstrates a large amount of ascites
within the left lower abdominal quadrant. The left lower abdomen was
prepped and draped in the usual sterile fashion. 1% lidocaine was
used for local anesthesia. Under direct ultrasound guidance, a 19
gauge, 7-cm, Yueh catheter was introduced. An ultrasound image was
saved for documentation purposed. The paracentesis was performed.
The catheter was removed and a dressing was applied. The patient
tolerated the procedure well without immediate post procedural
complication.
FINDINGS: A total of approximately 4.9 liters of blood-tinged fluid was
removed.
IMPRESSION: Successful ultrasound-guided therapeutic paracentesis yielding
liters of peritoneal fluid.

## 2016-01-31 ENCOUNTER — Ambulatory Visit: Payer: Medicaid Other

## 2016-01-31 ENCOUNTER — Other Ambulatory Visit: Payer: Medicaid Other

## 2016-02-05 ENCOUNTER — Ambulatory Visit: Payer: Medicaid Other

## 2016-02-07 ENCOUNTER — Other Ambulatory Visit (HOSPITAL_BASED_OUTPATIENT_CLINIC_OR_DEPARTMENT_OTHER): Payer: Medicaid Other

## 2016-02-07 ENCOUNTER — Ambulatory Visit (HOSPITAL_BASED_OUTPATIENT_CLINIC_OR_DEPARTMENT_OTHER): Payer: Medicaid Other

## 2016-02-07 ENCOUNTER — Ambulatory Visit (HOSPITAL_BASED_OUTPATIENT_CLINIC_OR_DEPARTMENT_OTHER): Payer: Medicaid Other | Admitting: Hematology and Oncology

## 2016-02-07 ENCOUNTER — Telehealth: Payer: Self-pay | Admitting: Hematology and Oncology

## 2016-02-07 ENCOUNTER — Encounter: Payer: Self-pay | Admitting: Hematology and Oncology

## 2016-02-07 DIAGNOSIS — C787 Secondary malignant neoplasm of liver and intrahepatic bile duct: Secondary | ICD-10-CM | POA: Diagnosis not present

## 2016-02-07 DIAGNOSIS — Z171 Estrogen receptor negative status [ER-]: Secondary | ICD-10-CM

## 2016-02-07 DIAGNOSIS — D701 Agranulocytosis secondary to cancer chemotherapy: Secondary | ICD-10-CM | POA: Diagnosis not present

## 2016-02-07 DIAGNOSIS — C50412 Malignant neoplasm of upper-outer quadrant of left female breast: Secondary | ICD-10-CM

## 2016-02-07 DIAGNOSIS — D6481 Anemia due to antineoplastic chemotherapy: Secondary | ICD-10-CM | POA: Diagnosis not present

## 2016-02-07 DIAGNOSIS — R770 Abnormality of albumin: Secondary | ICD-10-CM

## 2016-02-07 DIAGNOSIS — C773 Secondary and unspecified malignant neoplasm of axilla and upper limb lymph nodes: Secondary | ICD-10-CM

## 2016-02-07 DIAGNOSIS — Z5111 Encounter for antineoplastic chemotherapy: Secondary | ICD-10-CM | POA: Diagnosis present

## 2016-02-07 LAB — CBC WITH DIFFERENTIAL/PLATELET
BASO%: 0 % (ref 0.0–2.0)
BASOS ABS: 0 10*3/uL (ref 0.0–0.1)
EOS%: 0.5 % (ref 0.0–7.0)
Eosinophils Absolute: 0 10*3/uL (ref 0.0–0.5)
HCT: 33.4 % — ABNORMAL LOW (ref 34.8–46.6)
HGB: 10.7 g/dL — ABNORMAL LOW (ref 11.6–15.9)
LYMPH%: 34.4 % (ref 14.0–49.7)
MCH: 29.6 pg (ref 25.1–34.0)
MCHC: 32 g/dL (ref 31.5–36.0)
MCV: 92.5 fL (ref 79.5–101.0)
MONO#: 0.7 10*3/uL (ref 0.1–0.9)
MONO%: 12.8 % (ref 0.0–14.0)
NEUT#: 3 10*3/uL (ref 1.5–6.5)
NEUT%: 52.3 % (ref 38.4–76.8)
Platelets: 145 10*3/uL (ref 145–400)
RBC: 3.61 10*6/uL — ABNORMAL LOW (ref 3.70–5.45)
RDW: 18.4 % — ABNORMAL HIGH (ref 11.2–14.5)
WBC: 5.7 10*3/uL (ref 3.9–10.3)
lymph#: 2 10*3/uL (ref 0.9–3.3)

## 2016-02-07 LAB — COMPREHENSIVE METABOLIC PANEL
ALT: 25 U/L (ref 0–55)
AST: 27 U/L (ref 5–34)
Albumin: 4.2 g/dL (ref 3.5–5.0)
Alkaline Phosphatase: 150 U/L (ref 40–150)
Anion Gap: 6 mEq/L (ref 3–11)
BUN: 10.6 mg/dL (ref 7.0–26.0)
CALCIUM: 9.6 mg/dL (ref 8.4–10.4)
CHLORIDE: 105 meq/L (ref 98–109)
CO2: 27 mEq/L (ref 22–29)
Creatinine: 0.9 mg/dL (ref 0.6–1.1)
EGFR: >90 mL/min/{1.73_m2} (ref >90)
GLUCOSE: 93 mg/dL (ref 70–140)
POTASSIUM: 4.8 meq/L (ref 3.5–5.1)
SODIUM: 139 meq/L (ref 136–145)
Total Bilirubin: 0.42 mg/dL (ref 0.20–1.20)
Total Protein: 7.9 g/dL (ref 6.4–8.3)

## 2016-02-07 MED ORDER — SODIUM CHLORIDE 0.9 % IV SOLN
1300.0000 mg | Freq: Once | INTRAVENOUS | Status: AC
  Administered 2016-02-07: 1300 mg via INTRAVENOUS
  Filled 2016-02-07: qty 34.19

## 2016-02-07 MED ORDER — SODIUM CHLORIDE 0.9% FLUSH
10.0000 mL | INTRAVENOUS | Status: DC | PRN
  Administered 2016-02-07: 10 mL
  Filled 2016-02-07: qty 10

## 2016-02-07 MED ORDER — PEGFILGRASTIM 6 MG/0.6ML ~~LOC~~ PSKT
6.0000 mg | PREFILLED_SYRINGE | Freq: Once | SUBCUTANEOUS | Status: AC
  Administered 2016-02-07: 6 mg via SUBCUTANEOUS
  Filled 2016-02-07: qty 0.6

## 2016-02-07 MED ORDER — HEPARIN SOD (PORK) LOCK FLUSH 100 UNIT/ML IV SOLN
500.0000 [IU] | Freq: Once | INTRAVENOUS | Status: AC | PRN
Start: 2016-02-07 — End: 2016-02-07
  Administered 2016-02-07: 500 [IU]
  Filled 2016-02-07: qty 5

## 2016-02-07 MED ORDER — PROCHLORPERAZINE MALEATE 10 MG PO TABS
ORAL_TABLET | ORAL | Status: AC
  Filled 2016-02-07: qty 1

## 2016-02-07 MED ORDER — PROCHLORPERAZINE MALEATE 10 MG PO TABS
10.0000 mg | ORAL_TABLET | Freq: Once | ORAL | Status: AC
  Administered 2016-02-07: 10 mg via ORAL

## 2016-02-07 MED ORDER — SODIUM CHLORIDE 0.9 % IV SOLN
Freq: Once | INTRAVENOUS | Status: AC
  Administered 2016-02-07: 14:00:00 via INTRAVENOUS

## 2016-02-07 NOTE — Assessment & Plan Note (Addendum)
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  1. Normocytic anemia: Probably related to chemotherapy as well as anemia of chronic disease. We will have to monitor closely.  2. Neutropenia: We changed her chemotherapy regimen to gemcitabine every 2 weeks starting 12/27/2015.   Recurrent malignant ascites: Resolved. Peritoneal catheter has been removed.   Elevated liver function tests: Related to extensive liver metastases. AST levels are slowly coming down. We will have to watch her very closely. Alkaline phosphatase and total bilirubin have normalized today her AST is 54 and ALT is 40 and alkaline phosphatase is normal at 137  Severe hypoalbuminemia: Patient started a protein drink . our dietitian had met her.   CT chest abdomen pelvis 12/02/2015: No metastases in the chest, largest confluent area right lobe of the liver 7 x 5 cm significantly smaller (was 8.4 x 9.3 cm)  Return to clinic in 4 weeks for follow-up with scans  she will return every 2 weeks for gemcitabine  maintenance.

## 2016-02-07 NOTE — Patient Instructions (Signed)
[facility] Discharge Instructions for Patients Receiving Chemotherapy  Today you received the following chemotherapy agents Gemzar  To help prevent nausea and vomiting after your treatment, we encourage you to take your nausea medication as directed.    If you develop nausea and vomiting that is not controlled by your nausea medication, call the clinic.   BELOW ARE SYMPTOMS THAT SHOULD BE REPORTED IMMEDIATELY:  *FEVER GREATER THAN 100.5 F  *CHILLS WITH OR WITHOUT FEVER  NAUSEA AND VOMITING THAT IS NOT CONTROLLED WITH YOUR NAUSEA MEDICATION  *UNUSUAL SHORTNESS OF BREATH  *UNUSUAL BRUISING OR BLEEDING  TENDERNESS IN MOUTH AND THROAT WITH OR WITHOUT PRESENCE OF ULCERS  *URINARY PROBLEMS  *BOWEL PROBLEMS  UNUSUAL RASH Items with * indicate a potential emergency and should be followed up as soon as possible.  Feel free to call the clinic you have any questions or concerns. The clinic phone number is [phone].  Please show the CHEMO ALERT CARD at check-in to the Emergency Department and triage nurse.   

## 2016-02-07 NOTE — Telephone Encounter (Signed)
appt made and avs printed °

## 2016-02-07 NOTE — Progress Notes (Signed)
Patient Care Team: Pcp Not In System as PCP - General [name], MD as Consulting Physician (General Surgery) Nicholas Lose, MD as Consulting Physician (Hematology and Oncology) Kyung Rudd, MD as Consulting Physician (Radiation Oncology)  DIAGNOSIS: Breast cancer of upper-outer quadrant of left female breast [facility])   Staging form: Breast, AJCC 7th Edition   - Clinical stage from 07/17/2015: Stage IIIA (T3, N1, M0) - Unsigned         Staging comments: Staged at breast conference on 1.4.17  SUMMARY OF ONCOLOGIC HISTORY:   Breast cancer of upper-outer quadrant of left female breast ([place])   07/02/2015 Mammogram    Left breast mass 5.3 x 5.2 x 2.7 cm at 1:30 position, multiple abnormal enlarged left axillary lymph nodes     07/09/2015 Initial Diagnosis    Left breast biopsy 1:30: IDC grade 3, ER PR 0%, HER-2 negative ratio 1.65, Ki-67 90%; left axillary lymph node biopsy: IDC grade 3 completely replacing the lymph node ER PR 0%, HER-2 negative ratio 1.22 Ki-67 90%     07/25/2015 -  Chemotherapy    Palliative chemotherapy: Carboplatin and gemcitabine day 1 and day 8 every 3 weeks ( metastatic breast cancer with extensive liver metastases and malignant recurrent ascites)     12/02/2015 Imaging    CT CAP: Decrease in the liver metastases now measuring 7 x 5 cm was previously 8.4 x 9.3 cm.      CHIEF COMPLIANT: Follow-up on gemcitabine  INTERVAL HISTORY: Dawn Haynes is a 35 year old with triple negative metastatic breast cancer currently on gemcitabine maintenance therapy. She appears to be tolerating it extremely well. She does not have any nausea or vomiting. She no longer has abdominal distention and ascites. She is getting gemcitabine every 2 weeks. She gets him last with each dose of gemcitabine.  REVIEW OF SYSTEMS:   Constitutional: Denies fevers, chills or abnormal weight loss Eyes: Denies blurriness of vision Ears, nose, mouth, throat, and face: Denies mucositis or sore  throat Respiratory: Denies cough, dyspnea or wheezes Cardiovascular: Denies palpitation, chest discomfort Gastrointestinal:  Denies nausea, heartburn or change in bowel habits Skin: Denies abnormal skin rashes Lymphatics: Denies new lymphadenopathy or easy bruising Neurological:Denies numbness, tingling or new weaknesses Behavioral/Psych: Mood is stable, no new changes  Extremities: No lower extremity edema  All other systems were reviewed with the patient and are negative.  I have reviewed the past medical history, past surgical history, social history and family history with the patient and they are unchanged from previous note.  ALLERGIES:  has No Known Allergies.  MEDICATIONS:  Current Outpatient Prescriptions  Medication Sig Dispense Refill  . BORON PO Take 1 tablet by mouth 3 (three) times daily as needed (gas/flatulence).    . Cholecalciferol (VITAMIN D) 2000 units CAPS Take 2,000 Units by mouth daily.     . ondansetron (ZOFRAN) 4 MG tablet Take 1 tablet (4 mg total) by mouth every 8 (eight) hours as needed for nausea or vomiting. 30 tablet 0  . oxyCODONE (OXY IR/ROXICODONE) 5 MG immediate release tablet Take 1 tablet (5 mg total) by mouth every 4 (four) hours as needed for moderate pain. 60 tablet 0  . polyethylene glycol (MIRALAX / GLYCOLAX) packet Take 17 g by mouth daily. May increase to 34 g daily if needed 30 each 0  . Probiotic Product (SOLUBLE FIBER/PROBIOTICS PO) Take 1 tablet by mouth daily.     Marland Kitchen senna-docusate (SENOKOT-S) 8.6-50 MG tablet Take 2 tablets by mouth daily. 20 tablet 0  .  vitamin B-12 (CYANOCOBALAMIN) 100 MCG tablet Take 100 mcg by mouth daily.     No current facility-administered medications for this visit.     PHYSICAL EXAMINATION: ECOG PERFORMANCE STATUS: 1 - Symptomatic but completely ambulatory  Vitals:   02/07/16 1151  BP: 112/76  Pulse: 60  Resp: 18  Temp: 99.1 F (37.3 C)   Filed Weights   02/07/16 1151  Weight: 130 lb 9.6 oz (59.2  kg)    GENERAL:alert, no distress and comfortable SKIN: skin color, texture, turgor are normal, no rashes or significant lesions EYES: normal, Conjunctiva are pink and non-injected, sclera clear OROPHARYNX:no exudate, no erythema and lips, buccal mucosa, and tongue normal  NECK: supple, thyroid normal size, non-tender, without nodularity LYMPH:  no palpable lymphadenopathy in the cervical, axillary or inguinal LUNGS: clear to auscultation and percussion with normal breathing effort HEART: regular rate & rhythm and no murmurs and no lower extremity edema ABDOMEN:abdomen soft, non-tender and normal bowel sounds MUSCULOSKELETAL:no cyanosis of digits and no clubbing  NEURO: alert & oriented x 3 with fluent speech, no focal motor/sensory deficits EXTREMITIES: No lower extremity edema   LABORATORY DATA:  I have reviewed the data as listed   Chemistry      Component Value Date/Time   NA 139 01/24/2016 1114   K 4.0 01/24/2016 1114   CL 97 (L) 08/02/2015 2242   CO2 26 01/24/2016 1114   BUN 14.0 01/24/2016 1114   CREATININE 0.8 01/24/2016 1114      Component Value Date/Time   CALCIUM 9.3 01/24/2016 1114   ALKPHOS 146 01/24/2016 1114   AST 28 01/24/2016 1114   ALT 24 01/24/2016 1114   BILITOT 0.61 01/24/2016 1114       Lab Results  Component Value Date   WBC 5.7 02/07/2016   HGB 10.7 (L) 02/07/2016   HCT 33.4 (L) 02/07/2016   MCV 92.5 02/07/2016   PLT 145 02/07/2016   NEUTROABS 3.0 02/07/2016     ASSESSMENT & PLAN:  Breast cancer of upper-outer quadrant of left female breast ([place]) 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, Ki-67 90%;  left axillary lymph node biopsy: IDC grade 3 completely replacing the lymph node ER PR 0%, HER-2 negative ratio 1.22 Ki-67 90%  Left breast mass 5.3 x 5.2 x 2.7 cm at 1:30 position, multiple abnormal enlarged left axillary lymph nodes  Stage IV with liver involvement and recurrent malignant  ascites  Treatment plan: Palliative chemotherapy with carboplatin and gemcitabine started 07/24/2015 days 1 and 8 every 3 weeks 6 cycles, maintenance gemcitabine alone starting 12/13/2015 ----------------------------------------------------------------------------------------------------------------------------------------------------------  Current treatment: Carboplatin and gemcitabine completed 6 cycles, gemcitabine maintenance 12/27/2015 every 2 weeks   Chemotherapy toxicities: Patient denies any nausea or vomiting or any bowel issues related to chemotherapy.  1. Normocytic anemia: Probably related to chemotherapy as well as anemia of chronic disease. We will have to monitor closely.  2. Neutropenia: We changed her chemotherapy regimen to gemcitabine every 2 weeks starting 12/27/2015.   Recurrent malignant ascites: Resolved. Peritoneal catheter has been removed.   Elevated liver function tests: Related to extensive liver metastases. AST levels are slowly coming down. We will have to watch her very closely. Alkaline phosphatase and total bilirubin have normalized today her AST is 54 and ALT is 40 and alkaline phosphatase is normal at 137  Severe hypoalbuminemia: Patient started a protein drink . our dietitian had met her.   CT chest abdomen pelvis 12/02/2015: No metastases  in the chest, largest confluent area right lobe of the liver 7 x 5 cm significantly smaller (was 8.4 x 9.3 cm)  Return to clinic in 4 weeks for follow-up with scans  she will return every 2 weeks for gemcitabine maintenance.   Orders Placed This Encounter  Procedures  . CT Chest W Contrast    Standing Status:   Future    Standing Expiration Date:   02/06/2017    Order Specific Question:   If indicated for the ordered procedure, I authorize the administration of contrast media per Radiology protocol    Answer:   Yes    Order Specific Question:   Reason for Exam (SYMPTOM  OR DIAGNOSIS REQUIRED)     Answer:   Metastatic breast cancer reevaluation on chemo    Order Specific Question:   Is the patient pregnant?    Answer:   No    Order Specific Question:   Preferred imaging location?    Answer:   [facility]  . CT Abdomen Pelvis W Contrast    Standing Status:   Future    Standing Expiration Date:   02/06/2017    Order Specific Question:   If indicated for the ordered procedure, I authorize the administration of contrast media per Radiology protocol    Answer:   Yes    Order Specific Question:   Reason for Exam (SYMPTOM  OR DIAGNOSIS REQUIRED)    Answer:   Metastatic breast cancer reevaluation on chemo    Order Specific Question:   Is the patient pregnant?    Answer:   No    Order Specific Question:   Preferred imaging location?    Answer:   [facility]   The patient has a good understanding of the overall plan. she agrees with it. she will call with any problems that may develop before the next visit here.   Rulon Eisenmenger, MD 02/07/16

## 2016-02-14 ENCOUNTER — Other Ambulatory Visit: Payer: Medicaid Other

## 2016-02-14 ENCOUNTER — Ambulatory Visit: Payer: Medicaid Other

## 2016-02-21 ENCOUNTER — Ambulatory Visit (HOSPITAL_BASED_OUTPATIENT_CLINIC_OR_DEPARTMENT_OTHER): Payer: Medicaid Other

## 2016-02-21 ENCOUNTER — Other Ambulatory Visit: Payer: Self-pay | Admitting: Hematology and Oncology

## 2016-02-21 ENCOUNTER — Ambulatory Visit: Payer: Medicaid Other

## 2016-02-21 ENCOUNTER — Other Ambulatory Visit (HOSPITAL_BASED_OUTPATIENT_CLINIC_OR_DEPARTMENT_OTHER): Payer: Medicaid Other

## 2016-02-21 ENCOUNTER — Other Ambulatory Visit: Payer: Medicaid Other

## 2016-02-21 VITALS — BP 108/68 | HR 64 | Temp 98.4°F | Resp 18

## 2016-02-21 DIAGNOSIS — D701 Agranulocytosis secondary to cancer chemotherapy: Secondary | ICD-10-CM

## 2016-02-21 DIAGNOSIS — Z5111 Encounter for antineoplastic chemotherapy: Secondary | ICD-10-CM | POA: Diagnosis not present

## 2016-02-21 DIAGNOSIS — C50412 Malignant neoplasm of upper-outer quadrant of left female breast: Secondary | ICD-10-CM | POA: Diagnosis not present

## 2016-02-21 LAB — COMPREHENSIVE METABOLIC PANEL
ALT: 24 U/L (ref 0–55)
ANION GAP: 9 meq/L (ref 3–11)
AST: 27 U/L (ref 5–34)
Albumin: 4.1 g/dL (ref 3.5–5.0)
Alkaline Phosphatase: 158 U/L — ABNORMAL HIGH (ref 40–150)
BUN: 14.4 mg/dL (ref 7.0–26.0)
CHLORIDE: 104 meq/L (ref 98–109)
CO2: 26 meq/L (ref 22–29)
CREATININE: 0.9 mg/dL (ref 0.6–1.1)
Calcium: 9.5 mg/dL (ref 8.4–10.4)
EGFR: >90 mL/min/{1.73_m2} (ref >90)
GLUCOSE: 100 mg/dL (ref 70–140)
Potassium: 4.1 mEq/L (ref 3.5–5.1)
Sodium: 139 mEq/L (ref 136–145)
TOTAL PROTEIN: 7.7 g/dL (ref 6.4–8.3)
Total Bilirubin: 0.41 mg/dL (ref 0.20–1.20)

## 2016-02-21 LAB — CBC WITH DIFFERENTIAL/PLATELET
BASO%: 0.1 % (ref 0.0–2.0)
Basophils Absolute: 0 10*3/uL (ref 0.0–0.1)
EOS ABS: 0 10*3/uL (ref 0.0–0.5)
EOS%: 0.5 % (ref 0.0–7.0)
HEMATOCRIT: 33.5 % — AB (ref 34.8–46.6)
HEMOGLOBIN: 10.6 g/dL — AB (ref 11.6–15.9)
LYMPH#: 1.9 10*3/uL (ref 0.9–3.3)
LYMPH%: 34.1 % (ref 14.0–49.7)
MCH: 29.2 pg (ref 25.1–34.0)
MCHC: 31.6 g/dL (ref 31.5–36.0)
MCV: 92.6 fL (ref 79.5–101.0)
MONO#: 0.6 10*3/uL (ref 0.1–0.9)
MONO%: 10.2 % (ref 0.0–14.0)
NEUT%: 55.1 % (ref 38.4–76.8)
NEUTROS ABS: 3.1 10*3/uL (ref 1.5–6.5)
PLATELETS: 124 10*3/uL — AB (ref 145–400)
RBC: 3.62 10*6/uL — ABNORMAL LOW (ref 3.70–5.45)
RDW: 20.7 % — AB (ref 11.2–14.5)
WBC: 5.6 10*3/uL (ref 3.9–10.3)

## 2016-02-21 MED ORDER — PROCHLORPERAZINE MALEATE 10 MG PO TABS
ORAL_TABLET | ORAL | Status: AC
  Filled 2016-02-21: qty 1

## 2016-02-21 MED ORDER — PROCHLORPERAZINE MALEATE 10 MG PO TABS
10.0000 mg | ORAL_TABLET | Freq: Once | ORAL | Status: AC
  Administered 2016-02-21: 10 mg via ORAL

## 2016-02-21 MED ORDER — SODIUM CHLORIDE 0.9% FLUSH
10.0000 mL | INTRAVENOUS | Status: DC | PRN
  Administered 2016-02-21: 10 mL
  Filled 2016-02-21: qty 10

## 2016-02-21 MED ORDER — HEPARIN SOD (PORK) LOCK FLUSH 100 UNIT/ML IV SOLN
500.0000 [IU] | Freq: Once | INTRAVENOUS | Status: AC | PRN
  Administered 2016-02-21: 500 [IU]
  Filled 2016-02-21: qty 5

## 2016-02-21 MED ORDER — SODIUM CHLORIDE 0.9 % IV SOLN
1300.0000 mg | Freq: Once | INTRAVENOUS | Status: AC
  Administered 2016-02-21: 1300 mg via INTRAVENOUS
  Filled 2016-02-21: qty 34.19

## 2016-02-21 MED ORDER — SODIUM CHLORIDE 0.9 % IV SOLN
Freq: Once | INTRAVENOUS | Status: AC
  Administered 2016-02-21: 12:00:00 via INTRAVENOUS

## 2016-02-21 MED ORDER — PEGFILGRASTIM 6 MG/0.6ML ~~LOC~~ PSKT
6.0000 mg | PREFILLED_SYRINGE | Freq: Once | SUBCUTANEOUS | Status: AC
  Administered 2016-02-21: 6 mg via SUBCUTANEOUS
  Filled 2016-02-21: qty 0.6

## 2016-02-21 NOTE — Patient Instructions (Signed)
[facility] Discharge Instructions for Patients Receiving Chemotherapy  Today you received the following chemotherapy agents Gemzar  To help prevent nausea and vomiting after your treatment, we encourage you to take your nausea medication as directed.    If you develop nausea and vomiting that is not controlled by your nausea medication, call the clinic.   BELOW ARE SYMPTOMS THAT SHOULD BE REPORTED IMMEDIATELY:  *FEVER GREATER THAN 100.5 F  *CHILLS WITH OR WITHOUT FEVER  NAUSEA AND VOMITING THAT IS NOT CONTROLLED WITH YOUR NAUSEA MEDICATION  *UNUSUAL SHORTNESS OF BREATH  *UNUSUAL BRUISING OR BLEEDING  TENDERNESS IN MOUTH AND THROAT WITH OR WITHOUT PRESENCE OF ULCERS  *URINARY PROBLEMS  *BOWEL PROBLEMS  UNUSUAL RASH Items with * indicate a potential emergency and should be followed up as soon as possible.  Feel free to call the clinic you have any questions or concerns. The clinic phone number is [phone].  Please show the CHEMO ALERT CARD at check-in to the Emergency Department and triage nurse.   

## 2016-02-25 ENCOUNTER — Telehealth: Payer: Self-pay

## 2016-02-25 NOTE — Telephone Encounter (Signed)
Returned pt call re: CT scans - let her know authorization pending.    I asked Darlena to pre-auth today.  She sent to [facility] and is waiting for a response.  Routed to [employer]

## 2016-02-27 ENCOUNTER — Telehealth: Payer: Self-pay

## 2016-02-27 NOTE — Telephone Encounter (Signed)
Called to schedule pt for CT CAP.  Pt scheduled for 03/05/16 at 0900.  Pt to drink bottle 1 of oral contrast at 0700 and bottle 2 of oral contrast at 0800.  Pt to remain NPO 4 hours prior to scan.  Called pt and reviewed all information with her.  Pt without further questions or concerns at time of call.

## 2016-03-05 ENCOUNTER — Ambulatory Visit (HOSPITAL_COMMUNITY)
Admission: RE | Admit: 2016-03-05 | Discharge: 2016-03-05 | Disposition: A | Discharge status: Home / Self Care | Payer: Medicaid Other | Source: Ambulatory Visit | Attending: Hematology and Oncology | Admitting: Hematology and Oncology

## 2016-03-05 ENCOUNTER — Encounter (HOSPITAL_COMMUNITY): Payer: Self-pay

## 2016-03-05 DIAGNOSIS — C787 Secondary malignant neoplasm of liver and intrahepatic bile duct: Secondary | ICD-10-CM | POA: Diagnosis not present

## 2016-03-05 DIAGNOSIS — C50412 Malignant neoplasm of upper-outer quadrant of left female breast: Secondary | ICD-10-CM | POA: Insufficient documentation

## 2016-03-05 MED ORDER — IOPAMIDOL (ISOVUE-300) INJECTION 61%
100.0000 mL | Freq: Once | INTRAVENOUS | Status: AC | PRN
  Administered 2016-03-05: 100 mL via INTRAVENOUS

## 2016-03-06 ENCOUNTER — Ambulatory Visit (HOSPITAL_BASED_OUTPATIENT_CLINIC_OR_DEPARTMENT_OTHER): Payer: Medicaid Other | Admitting: Hematology and Oncology

## 2016-03-06 ENCOUNTER — Encounter: Payer: Self-pay | Admitting: Hematology and Oncology

## 2016-03-06 ENCOUNTER — Other Ambulatory Visit (HOSPITAL_BASED_OUTPATIENT_CLINIC_OR_DEPARTMENT_OTHER): Payer: Medicaid Other

## 2016-03-06 ENCOUNTER — Other Ambulatory Visit: Payer: Medicaid Other

## 2016-03-06 ENCOUNTER — Ambulatory Visit (HOSPITAL_BASED_OUTPATIENT_CLINIC_OR_DEPARTMENT_OTHER): Payer: Medicaid Other

## 2016-03-06 ENCOUNTER — Telehealth: Payer: Self-pay | Admitting: Hematology and Oncology

## 2016-03-06 DIAGNOSIS — Z5111 Encounter for antineoplastic chemotherapy: Secondary | ICD-10-CM | POA: Diagnosis present

## 2016-03-06 DIAGNOSIS — C50412 Malignant neoplasm of upper-outer quadrant of left female breast: Secondary | ICD-10-CM | POA: Diagnosis present

## 2016-03-06 DIAGNOSIS — C773 Secondary and unspecified malignant neoplasm of axilla and upper limb lymph nodes: Secondary | ICD-10-CM

## 2016-03-06 DIAGNOSIS — C787 Secondary malignant neoplasm of liver and intrahepatic bile duct: Secondary | ICD-10-CM | POA: Diagnosis not present

## 2016-03-06 DIAGNOSIS — D6481 Anemia due to antineoplastic chemotherapy: Secondary | ICD-10-CM

## 2016-03-06 DIAGNOSIS — Z171 Estrogen receptor negative status [ER-]: Secondary | ICD-10-CM

## 2016-03-06 DIAGNOSIS — D701 Agranulocytosis secondary to cancer chemotherapy: Secondary | ICD-10-CM

## 2016-03-06 DIAGNOSIS — R770 Abnormality of albumin: Secondary | ICD-10-CM

## 2016-03-06 LAB — CBC WITH DIFFERENTIAL/PLATELET
BASO%: 0.2 % (ref 0.0–2.0)
BASOS ABS: 0 10*3/uL (ref 0.0–0.1)
EOS ABS: 0 10*3/uL (ref 0.0–0.5)
EOS%: 0.4 % (ref 0.0–7.0)
HCT: 34.5 % — ABNORMAL LOW (ref 34.8–46.6)
HEMOGLOBIN: 11 g/dL — AB (ref 11.6–15.9)
LYMPH%: 38.6 % (ref 14.0–49.7)
MCH: 29.8 pg (ref 25.1–34.0)
MCHC: 31.9 g/dL (ref 31.5–36.0)
MCV: 93.4 fL (ref 79.5–101.0)
MONO#: 0.7 10*3/uL (ref 0.1–0.9)
MONO%: 11.5 % (ref 0.0–14.0)
NEUT#: 2.8 10*3/uL (ref 1.5–6.5)
NEUT%: 49.3 % (ref 38.4–76.8)
Platelets: 164 10*3/uL (ref 145–400)
RBC: 3.69 10*6/uL — AB (ref 3.70–5.45)
RDW: 21.9 % — AB (ref 11.2–14.5)
WBC: 5.7 10*3/uL (ref 3.9–10.3)
lymph#: 2.2 10*3/uL (ref 0.9–3.3)

## 2016-03-06 LAB — COMPREHENSIVE METABOLIC PANEL
ALK PHOS: 162 U/L — AB (ref 40–150)
ALT: 23 U/L (ref 0–55)
AST: 28 U/L (ref 5–34)
Albumin: 4.2 g/dL (ref 3.5–5.0)
Anion Gap: 8 mEq/L (ref 3–11)
BUN: 8 mg/dL (ref 7.0–26.0)
CHLORIDE: 105 meq/L (ref 98–109)
CO2: 27 meq/L (ref 22–29)
Calcium: 9.7 mg/dL (ref 8.4–10.4)
Creatinine: 0.9 mg/dL (ref 0.6–1.1)
GLUCOSE: 90 mg/dL (ref 70–140)
POTASSIUM: 4.5 meq/L (ref 3.5–5.1)
SODIUM: 140 meq/L (ref 136–145)
Total Bilirubin: 0.49 mg/dL (ref 0.20–1.20)
Total Protein: 7.9 g/dL (ref 6.4–8.3)

## 2016-03-06 MED ORDER — PROCHLORPERAZINE MALEATE 10 MG PO TABS
10.0000 mg | ORAL_TABLET | Freq: Once | ORAL | Status: AC
  Administered 2016-03-06: 10 mg via ORAL

## 2016-03-06 MED ORDER — PEGFILGRASTIM 6 MG/0.6ML ~~LOC~~ PSKT
6.0000 mg | PREFILLED_SYRINGE | Freq: Once | SUBCUTANEOUS | Status: AC
  Administered 2016-03-06: 6 mg via SUBCUTANEOUS
  Filled 2016-03-06: qty 0.6

## 2016-03-06 MED ORDER — PROCHLORPERAZINE MALEATE 10 MG PO TABS
ORAL_TABLET | ORAL | Status: AC
  Filled 2016-03-06: qty 1

## 2016-03-06 MED ORDER — HEPARIN SOD (PORK) LOCK FLUSH 100 UNIT/ML IV SOLN
500.0000 [IU] | Freq: Once | INTRAVENOUS | Status: AC | PRN
Start: 2016-03-06 — End: 2016-03-06
  Administered 2016-03-06: 500 [IU]
  Filled 2016-03-06: qty 5

## 2016-03-06 MED ORDER — SODIUM CHLORIDE 0.9% FLUSH
10.0000 mL | INTRAVENOUS | Status: DC | PRN
  Administered 2016-03-06: 10 mL
  Filled 2016-03-06: qty 10

## 2016-03-06 MED ORDER — SODIUM CHLORIDE 0.9 % IV SOLN
1300.0000 mg | Freq: Once | INTRAVENOUS | Status: AC
  Administered 2016-03-06: 1300 mg via INTRAVENOUS
  Filled 2016-03-06: qty 34.19

## 2016-03-06 MED ORDER — SODIUM CHLORIDE 0.9 % IV SOLN
Freq: Once | INTRAVENOUS | Status: AC
  Administered 2016-03-06: 12:00:00 via INTRAVENOUS

## 2016-03-06 NOTE — Assessment & Plan Note (Signed)
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  1. Normocytic anemia: Probably related to chemotherapy as well as anemia of chronic disease. We will have to monitor closely.  2. Neutropenia: We changed her chemotherapy regimen to gemcitabine every 2 weeks starting 12/27/2015.   Recurrent malignant ascites: Resolved. Peritoneal catheter has been removed.   Elevated liver function tests: Related to extensive liver metastases. AST levels are slowly coming down. We will have to watch her very closely. Alkaline phosphatase and total bilirubin have normalized   Severe hypoalbuminemia: Patient is improving her protein consumption.   CT chest abdomen pelvis 12/02/2015: No metastases in the chest, largest confluent area right lobe of the liver 7 x 5 cm significantly smaller (was 8.4 x 9.3 cm)  CT CAP 03/05/2016: Interval decrease in the heterogeneous posterior right lower lobe mass from 6.5 x 6.1 cm to 6.2 x 4.8 cm  she will return every 2 weeks for gemcitabine maintenance and every 4  weeks for follow-up in the clinic.

## 2016-03-06 NOTE — Patient Instructions (Addendum)
[place] Discharge Instructions for Patients Receiving Chemotherapy  Today you received the following chemotherapy agents Gemzar.   To help prevent nausea and vomiting after your treatment, we encourage you to take your nausea medication as directed.   If you develop nausea and vomiting that is not controlled by your nausea medication, call the clinic.   BELOW ARE SYMPTOMS THAT SHOULD BE REPORTED IMMEDIATELY:  *FEVER GREATER THAN 100.5 F  *CHILLS WITH OR WITHOUT FEVER  NAUSEA AND VOMITING THAT IS NOT CONTROLLED WITH YOUR NAUSEA MEDICATION  *UNUSUAL SHORTNESS OF BREATH  *UNUSUAL BRUISING OR BLEEDING  TENDERNESS IN MOUTH AND THROAT WITH OR WITHOUT PRESENCE OF ULCERS  *URINARY PROBLEMS  *BOWEL PROBLEMS  UNUSUAL RASH Items with * indicate a potential emergency and should be followed up as soon as possible.  Feel free to call the clinic you have any questions or concerns. The clinic phone number is [phone].  Please show the [place] at check-in to the Emergency Department and triage nurse.  Pegfilgrastim injection What is this medicine? PEGFILGRASTIM (PEG fil gra stim) is a long-acting granulocyte colony-stimulating factor that stimulates the growth of neutrophils, a type of white blood cell important in the body's fight against infection. It is used to reduce the incidence of fever and infection in patients with certain types of cancer who are receiving chemotherapy that affects the bone marrow, and to increase survival after being exposed to high doses of radiation. This medicine may be used for other purposes; ask your health care provider or pharmacist if you have questions. What should I tell my health care provider before I take this medicine? They need to know if you have any of these conditions: -kidney disease -latex allergy -ongoing radiation therapy -sickle cell disease -skin reactions to acrylic adhesives (On-Body Injector only) -an  unusual or allergic reaction to pegfilgrastim, filgrastim, other medicines, foods, dyes, or preservatives -pregnant or trying to get pregnant -breast-feeding How should I use this medicine? This medicine is for injection under the skin. If you get this medicine at home, you will be taught how to prepare and give the pre-filled syringe or how to use the On-body Injector. Refer to the patient Instructions for Use for detailed instructions. Use exactly as directed. Take your medicine at regular intervals. Do not take your medicine more often than directed. It is important that you put your used needles and syringes in a special sharps container. Do not put them in a trash can. If you do not have a sharps container, call your pharmacist or healthcare provider to get one. Talk to your pediatrician regarding the use of this medicine in children. While this drug may be prescribed for selected conditions, precautions do apply. Overdosage: If you think you have taken too much of this medicine contact a poison control center or emergency room at once. NOTE: This medicine is only for you. Do not share this medicine with others. What if I miss a dose? It is important not to miss your dose. Call your doctor or health care professional if you miss your dose. If you miss a dose due to an On-body Injector failure or leakage, a new dose should be administered as soon as possible using a single prefilled syringe for manual use. What may interact with this medicine? Interactions have not been studied. Give your health care provider a list of all the medicines, herbs, non-prescription drugs, or dietary supplements you use. Also tell them if you smoke, drink alcohol,  or use illegal drugs. Some items may interact with your medicine. This list may not describe all possible interactions. Give your health care provider a list of all the medicines, herbs, non-prescription drugs, or dietary supplements you use. Also tell them if  you smoke, drink alcohol, or use illegal drugs. Some items may interact with your medicine. What should I watch for while using this medicine? You may need blood work done while you are taking this medicine. If you are going to need a MRI, CT scan, or other procedure, tell your doctor that you are using this medicine (On-Body Injector only). What side effects may I notice from receiving this medicine? Side effects that you should report to your doctor or health care professional as soon as possible: -allergic reactions like skin rash, itching or hives, swelling of the face, lips, or tongue -dizziness -fever -pain, redness, or irritation at site where injected -pinpoint red spots on the skin -red or dark-brown urine -shortness of breath or breathing problems -stomach or side pain, or pain at the shoulder -swelling -tiredness -trouble passing urine or change in the amount of urine Side effects that usually do not require medical attention (report to your doctor or health care professional if they continue or are bothersome): -bone pain -muscle pain This list may not describe all possible side effects. Call your doctor for medical advice about side effects. You may report side effects to FDA at 1-800-FDA-1088. Where should I keep my medicine? Keep out of the reach of children. Store pre-filled syringes in a refrigerator between 2 and 8 degrees C (36 and 46 degrees F). Do not freeze. Keep in carton to protect from light. Throw away this medicine if it is left out of the refrigerator for more than 48 hours. Throw away any unused medicine after the expiration date. NOTE: This sheet is a summary. It may not cover all possible information. If you have questions about this medicine, talk to your doctor, pharmacist, or health care provider.    2016, [employer]vier/[employer] Standard. (2014-07-19 14:30:14)

## 2016-03-06 NOTE — Progress Notes (Signed)
Patient Care Team: Pcp Not In System as PCP - General [name], MD as Consulting Physician (General Surgery) Nicholas Lose, MD as Consulting Physician (Hematology and Oncology) Kyung Rudd, MD as Consulting Physician (Radiation Oncology)  DIAGNOSIS: Breast cancer of upper-outer quadrant of left female breast [facility])   Staging form: Breast, AJCC 7th Edition   - Clinical stage from 07/17/2015: Stage IIIA (T3, N1, M0) - Unsigned         Staging comments: Staged at breast conference on 1.4.17  SUMMARY OF ONCOLOGIC HISTORY:   Breast cancer of upper-outer quadrant of left female breast ([place])   07/02/2015 Mammogram    Left breast mass 5.3 x 5.2 x 2.7 cm at 1:30 position, multiple abnormal enlarged left axillary lymph nodes      07/09/2015 Initial Diagnosis    Left breast biopsy 1:30: IDC grade 3, ER PR 0%, HER-2 negative ratio 1.65, Ki-67 90%; left axillary lymph node biopsy: IDC grade 3 completely replacing the lymph node ER PR 0%, HER-2 negative ratio 1.22 Ki-67 90%      07/25/2015 -  Chemotherapy    Palliative chemotherapy: Carboplatin and gemcitabine day 1 and day 8 every 3 weeks ( metastatic breast cancer with extensive liver metastases and malignant recurrent ascites), switched to gemcitabine every 2 weeks      12/02/2015 Imaging    CT CAP: Decrease in the liver metastases now measuring 7 x 5 cm was previously 8.4 x 9.3 cm.      03/05/2016 Imaging    CT CAP: Interval decrease in the heterogeneous posterior right lower lobe mass from 6.5 x 6.1 cm to 6.2 x 4.8 cm       CHIEF COMPLIANT: Metastatic breast cancer on palliative chemotherapy with gemcitabine, here to follow-up on scans  INTERVAL HISTORY: Dawn Haynes is a 35 year old with above-mentioned history of metastatic breast cancer currently on palliative chemotherapy with gemcitabine that she receives every 2 weeks. She underwent a recent CT scans and is here today to discuss the results. Patient has not had any further  problems with ascites. She is eating well having a fairly normal day-to-day life. Denies any new symptoms or concerns. She is here today accompanied by her husband and her 3 children.  REVIEW OF SYSTEMS:   Constitutional: Denies fevers, chills or abnormal weight loss Eyes: Denies blurriness of vision Ears, nose, mouth, throat, and face: Denies mucositis or sore throat Respiratory: Denies cough, dyspnea or wheezes Cardiovascular: Denies palpitation, chest discomfort Gastrointestinal:  Denies nausea, heartburn or change in bowel habits Skin: Denies abnormal skin rashes Lymphatics: Denies new lymphadenopathy or easy bruising Neurological:Denies numbness, tingling or new weaknesses Behavioral/Psych: Mood is stable, no new changes  Extremities: No lower extremity edema Breast:  denies any pain or lumps or nodules in either breasts All other systems were reviewed with the patient and are negative.  I have reviewed the past medical history, past surgical history, social history and family history with the patient and they are unchanged from previous note.  ALLERGIES:  has No Known Allergies.  MEDICATIONS:  Current Outpatient Prescriptions  Medication Sig Dispense Refill  . BORON PO Take 1 tablet by mouth 3 (three) times daily as needed (gas/flatulence).    . Cholecalciferol (VITAMIN D) 2000 units CAPS Take 2,000 Units by mouth daily.     . ondansetron (ZOFRAN) 4 MG tablet Take 1 tablet (4 mg total) by mouth every 8 (eight) hours as needed for nausea or vomiting. 30 tablet 0  . oxyCODONE (OXY IR/ROXICODONE)  5 MG immediate release tablet Take 1 tablet (5 mg total) by mouth every 4 (four) hours as needed for moderate pain. 60 tablet 0  . polyethylene glycol (MIRALAX / GLYCOLAX) packet Take 17 g by mouth daily. May increase to 34 g daily if needed 30 each 0  . Probiotic Product (SOLUBLE FIBER/PROBIOTICS PO) Take 1 tablet by mouth daily.     Marland Kitchen senna-docusate (SENOKOT-S) 8.6-50 MG tablet Take 2  tablets by mouth daily. 20 tablet 0  . vitamin B-12 (CYANOCOBALAMIN) 100 MCG tablet Take 100 mcg by mouth daily.     No current facility-administered medications for this visit.     PHYSICAL EXAMINATION: ECOG PERFORMANCE STATUS: 0 - Asymptomatic  Vitals:   03/06/16 1100  BP: 122/79  Pulse: 73  Resp: 18  Temp: 99.1 F (37.3 C)   Filed Weights   03/06/16 1100  Weight: 131 lb 4.8 oz (59.6 kg)    GENERAL:alert, no distress and comfortable SKIN: skin color, texture, turgor are normal, no rashes or significant lesions EYES: normal, Conjunctiva are pink and non-injected, sclera clear OROPHARYNX:no exudate, no erythema and lips, buccal mucosa, and tongue normal  NECK: supple, thyroid normal size, non-tender, without nodularity LYMPH:  no palpable lymphadenopathy in the cervical, axillary or inguinal LUNGS: clear to auscultation and percussion with normal breathing effort HEART: regular rate & rhythm and no murmurs and no lower extremity edema ABDOMEN:abdomen soft, non-tender and normal bowel sounds MUSCULOSKELETAL:no cyanosis of digits and no clubbing  NEURO: alert & oriented x 3 with fluent speech, no focal motor/sensory deficits EXTREMITIES: No lower extremity edema  LABORATORY DATA:  I have reviewed the data as listed   Chemistry      Component Value Date/Time   NA 139 02/21/2016 1108   K 4.1 02/21/2016 1108   CL 97 (L) 08/02/2015 2242   CO2 26 02/21/2016 1108   BUN 14.4 02/21/2016 1108   CREATININE 0.9 02/21/2016 1108      Component Value Date/Time   CALCIUM 9.5 02/21/2016 1108   ALKPHOS 158 (H) 02/21/2016 1108   AST 27 02/21/2016 1108   ALT 24 02/21/2016 1108   BILITOT 0.41 02/21/2016 1108       Lab Results  Component Value Date   WBC 5.7 03/06/2016   HGB 11.0 (L) 03/06/2016   HCT 34.5 (L) 03/06/2016   MCV 93.4 03/06/2016   PLT 164 03/06/2016   NEUTROABS 2.8 03/06/2016     ASSESSMENT & PLAN:  Breast cancer of upper-outer quadrant of left female breast  ([place]) 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, Ki-67 90%;  left axillary lymph node biopsy: IDC grade 3 completely replacing the lymph node ER PR 0%, HER-2 negative ratio 1.22 Ki-67 90%  Left breast mass 5.3 x 5.2 x 2.7 cm at 1:30 position, multiple abnormal enlarged left axillary lymph nodes  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  1. Normocytic anemia: Probably related to chemotherapy as well as anemia of chronic disease. We will have to monitor closely.  2. Neutropenia: We changed her chemotherapy regimen to gemcitabine every 2 weeks starting 12/27/2015.  Recurrent malignant ascites: Resolved. Peritoneal catheter has been removed.   Elevated liver function tests: Related to extensive liver metastases. AST levels are slowly coming down. We will have to watch her very closely. Alkaline phosphatase and total bilirubin have normalized   Severe hypoalbuminemia: Patient is improving her protein consumption.   CT chest abdomen pelvis 12/02/2015: No metastases in the chest, largest confluent area right lobe of the liver 7 x 5 cm significantly smaller (was 8.4 x 9.3 cm)  CT CAP 03/05/2016: Interval decrease in the heterogeneous posterior right lower lobe mass from 6.5 x 6.1 cm to 6.2 x 4.8 cm  she will return every 2 weeks for gemcitabine maintenance and  every 4 weeks for follow-up in the clinic.   No orders of the defined types were placed in this encounter.  The patient has a good understanding of the overall plan. she agrees with it. she will call with any problems that may develop before the next visit here.   Rulon Eisenmenger, MD 03/06/16

## 2016-03-06 NOTE — Telephone Encounter (Signed)
Added appts and schedule to print in treatment room

## 2016-03-11 ENCOUNTER — Ambulatory Visit: Payer: Medicaid Other | Admitting: Hematology and Oncology

## 2016-03-11 ENCOUNTER — Other Ambulatory Visit: Payer: Medicaid Other

## 2016-03-18 ENCOUNTER — Other Ambulatory Visit: Payer: Self-pay

## 2016-03-18 DIAGNOSIS — C50412 Malignant neoplasm of upper-outer quadrant of left female breast: Secondary | ICD-10-CM

## 2016-03-19 ENCOUNTER — Telehealth: Payer: Self-pay | Admitting: Hematology and Oncology

## 2016-03-19 NOTE — Telephone Encounter (Signed)
03/20/2016 Appointment canceled per patient request. Patient called to cancel appointment, no reason given.

## 2016-03-20 ENCOUNTER — Ambulatory Visit: Payer: Medicaid Other

## 2016-03-20 ENCOUNTER — Other Ambulatory Visit: Payer: Medicaid Other

## 2016-04-03 ENCOUNTER — Ambulatory Visit (HOSPITAL_BASED_OUTPATIENT_CLINIC_OR_DEPARTMENT_OTHER): Payer: Medicaid Other | Admitting: Hematology and Oncology

## 2016-04-03 ENCOUNTER — Ambulatory Visit (HOSPITAL_BASED_OUTPATIENT_CLINIC_OR_DEPARTMENT_OTHER): Payer: Medicaid Other

## 2016-04-03 ENCOUNTER — Other Ambulatory Visit: Payer: Medicaid Other

## 2016-04-03 VITALS — BP 121/70 | HR 119 | Temp 99.3°F | Resp 18 | Wt 135.1 lb

## 2016-04-03 DIAGNOSIS — C50412 Malignant neoplasm of upper-outer quadrant of left female breast: Secondary | ICD-10-CM

## 2016-04-03 DIAGNOSIS — R7401 Elevation of levels of liver transaminase levels: Secondary | ICD-10-CM

## 2016-04-03 DIAGNOSIS — D6481 Anemia due to antineoplastic chemotherapy: Secondary | ICD-10-CM

## 2016-04-03 DIAGNOSIS — R740 Nonspecific elevation of levels of transaminase and lactic acid dehydrogenase [LDH]: Secondary | ICD-10-CM

## 2016-04-03 DIAGNOSIS — C787 Secondary malignant neoplasm of liver and intrahepatic bile duct: Secondary | ICD-10-CM | POA: Diagnosis not present

## 2016-04-03 DIAGNOSIS — D701 Agranulocytosis secondary to cancer chemotherapy: Secondary | ICD-10-CM | POA: Diagnosis not present

## 2016-04-03 DIAGNOSIS — R945 Abnormal results of liver function studies: Secondary | ICD-10-CM | POA: Diagnosis not present

## 2016-04-03 DIAGNOSIS — Z5111 Encounter for antineoplastic chemotherapy: Secondary | ICD-10-CM

## 2016-04-03 DIAGNOSIS — R770 Abnormality of albumin: Secondary | ICD-10-CM

## 2016-04-03 DIAGNOSIS — C773 Secondary and unspecified malignant neoplasm of axilla and upper limb lymph nodes: Secondary | ICD-10-CM

## 2016-04-03 DIAGNOSIS — R180 Malignant ascites: Secondary | ICD-10-CM

## 2016-04-03 DIAGNOSIS — R188 Other ascites: Secondary | ICD-10-CM

## 2016-04-03 LAB — CBC WITH DIFFERENTIAL/PLATELET
BASO%: 0.4 % (ref 0.0–2.0)
BASOS ABS: 0 10*3/uL (ref 0.0–0.1)
EOS ABS: 0 10*3/uL (ref 0.0–0.5)
EOS%: 0.9 % (ref 0.0–7.0)
HCT: 35.3 % (ref 34.8–46.6)
HGB: 11.2 g/dL — ABNORMAL LOW (ref 11.6–15.9)
LYMPH%: 47.4 % (ref 14.0–49.7)
MCH: 30.9 pg (ref 25.1–34.0)
MCHC: 31.7 g/dL (ref 31.5–36.0)
MCV: 97.4 fL (ref 79.5–101.0)
MONO#: 0.5 10*3/uL (ref 0.1–0.9)
MONO%: 15.6 % — AB (ref 0.0–14.0)
NEUT%: 35.7 % — AB (ref 38.4–76.8)
NEUTROS ABS: 1.1 10*3/uL — AB (ref 1.5–6.5)
PLATELETS: 225 10*3/uL (ref 145–400)
RBC: 3.62 10*6/uL — AB (ref 3.70–5.45)
RDW: 17.4 % — ABNORMAL HIGH (ref 11.2–14.5)
WBC: 3 10*3/uL — ABNORMAL LOW (ref 3.9–10.3)
lymph#: 1.4 10*3/uL (ref 0.9–3.3)

## 2016-04-03 LAB — COMPREHENSIVE METABOLIC PANEL
ALK PHOS: 118 U/L (ref 40–150)
ALT: 21 U/L (ref 0–55)
ANION GAP: 8 meq/L (ref 3–11)
AST: 28 U/L (ref 5–34)
Albumin: 4.2 g/dL (ref 3.5–5.0)
BILIRUBIN TOTAL: 0.82 mg/dL (ref 0.20–1.20)
BUN: 9.2 mg/dL (ref 7.0–26.0)
CO2: 27 meq/L (ref 22–29)
Calcium: 9.4 mg/dL (ref 8.4–10.4)
Chloride: 104 mEq/L (ref 98–109)
Creatinine: 0.8 mg/dL (ref 0.6–1.1)
GLUCOSE: 73 mg/dL (ref 70–140)
POTASSIUM: 3.7 meq/L (ref 3.5–5.1)
SODIUM: 139 meq/L (ref 136–145)
TOTAL PROTEIN: 8 g/dL (ref 6.4–8.3)

## 2016-04-03 MED ORDER — PROCHLORPERAZINE MALEATE 10 MG PO TABS
10.0000 mg | ORAL_TABLET | Freq: Once | ORAL | Status: AC
  Administered 2016-04-03: 10 mg via ORAL

## 2016-04-03 MED ORDER — GEMCITABINE HCL CHEMO INJECTION 1 GM/26.3ML
1300.0000 mg | Freq: Once | INTRAVENOUS | Status: AC
  Administered 2016-04-03: 1300 mg via INTRAVENOUS
  Filled 2016-04-03: qty 34.19

## 2016-04-03 MED ORDER — SODIUM CHLORIDE 0.9% FLUSH
10.0000 mL | INTRAVENOUS | Status: DC | PRN
  Administered 2016-04-03: 10 mL
  Filled 2016-04-03: qty 10

## 2016-04-03 MED ORDER — SODIUM CHLORIDE 0.9 % IV SOLN
Freq: Once | INTRAVENOUS | Status: AC
  Administered 2016-04-03: 13:00:00 via INTRAVENOUS

## 2016-04-03 MED ORDER — HEPARIN SOD (PORK) LOCK FLUSH 100 UNIT/ML IV SOLN
500.0000 [IU] | Freq: Once | INTRAVENOUS | Status: AC | PRN
Start: 2016-04-03 — End: 2016-04-03
  Administered 2016-04-03: 500 [IU]
  Filled 2016-04-03: qty 5

## 2016-04-03 MED ORDER — PEGFILGRASTIM 6 MG/0.6ML ~~LOC~~ PSKT
6.0000 mg | PREFILLED_SYRINGE | Freq: Once | SUBCUTANEOUS | Status: AC
  Administered 2016-04-03: 6 mg via SUBCUTANEOUS
  Filled 2016-04-03: qty 0.6

## 2016-04-03 MED ORDER — PROCHLORPERAZINE MALEATE 10 MG PO TABS
ORAL_TABLET | ORAL | Status: AC
  Filled 2016-04-03: qty 1

## 2016-04-03 NOTE — Patient Instructions (Signed)
[facility] Discharge Instructions for Patients Receiving Chemotherapy  Today you received the following chemotherapy agents:  Gemzar  To help prevent nausea and vomiting after your treatment, we encourage you to take your nausea medication as prescribed.   If you develop nausea and vomiting that is not controlled by your nausea medication, call the clinic.   BELOW ARE SYMPTOMS THAT SHOULD BE REPORTED IMMEDIATELY:  *FEVER GREATER THAN 100.5 F  *CHILLS WITH OR WITHOUT FEVER  NAUSEA AND VOMITING THAT IS NOT CONTROLLED WITH YOUR NAUSEA MEDICATION  *UNUSUAL SHORTNESS OF BREATH  *UNUSUAL BRUISING OR BLEEDING  TENDERNESS IN MOUTH AND THROAT WITH OR WITHOUT PRESENCE OF ULCERS  *URINARY PROBLEMS  *BOWEL PROBLEMS  UNUSUAL RASH Items with * indicate a potential emergency and should be followed up as soon as possible.  Feel free to call the clinic you have any questions or concerns. The clinic phone number is [phone].  Please show the [place] at check-in to the Emergency Department and triage nurse.  Pegfilgrastim injection What is this medicine? PEGFILGRASTIM (PEG fil gra stim) is a long-acting granulocyte colony-stimulating factor that stimulates the growth of neutrophils, a type of white blood cell important in the body's fight against infection. It is used to reduce the incidence of fever and infection in patients with certain types of cancer who are receiving chemotherapy that affects the bone marrow, and to increase survival after being exposed to high doses of radiation. This medicine may be used for other purposes; ask your health care provider or pharmacist if you have questions. What should I tell my health care provider before I take this medicine? They need to know if you have any of these conditions: -kidney disease -latex allergy -ongoing radiation therapy -sickle cell disease -skin reactions to acrylic adhesives (On-Body Injector  only) -an unusual or allergic reaction to pegfilgrastim, filgrastim, other medicines, foods, dyes, or preservatives -pregnant or trying to get pregnant -breast-feeding How should I use this medicine? This medicine is for injection under the skin. If you get this medicine at home, you will be taught how to prepare and give the pre-filled syringe or how to use the On-body Injector. Refer to the patient Instructions for Use for detailed instructions. Use exactly as directed. Take your medicine at regular intervals. Do not take your medicine more often than directed. It is important that you put your used needles and syringes in a special sharps container. Do not put them in a trash can. If you do not have a sharps container, call your pharmacist or healthcare provider to get one. Talk to your pediatrician regarding the use of this medicine in children. While this drug may be prescribed for selected conditions, precautions do apply. Overdosage: If you think you have taken too much of this medicine contact a poison control center or emergency room at once. NOTE: This medicine is only for you. Do not share this medicine with others. What if I miss a dose? It is important not to miss your dose. Call your doctor or health care professional if you miss your dose. If you miss a dose due to an On-body Injector failure or leakage, a new dose should be administered as soon as possible using a single prefilled syringe for manual use. What may interact with this medicine? Interactions have not been studied. Give your health care provider a list of all the medicines, herbs, non-prescription drugs, or dietary supplements you use. Also tell them if you smoke, drink alcohol,  or use illegal drugs. Some items may interact with your medicine. This list may not describe all possible interactions. Give your health care provider a list of all the medicines, herbs, non-prescription drugs, or dietary supplements you use. Also  tell them if you smoke, drink alcohol, or use illegal drugs. Some items may interact with your medicine. What should I watch for while using this medicine? You may need blood work done while you are taking this medicine. If you are going to need a MRI, CT scan, or other procedure, tell your doctor that you are using this medicine (On-Body Injector only). What side effects may I notice from receiving this medicine? Side effects that you should report to your doctor or health care professional as soon as possible: -allergic reactions like skin rash, itching or hives, swelling of the face, lips, or tongue -dizziness -fever -pain, redness, or irritation at site where injected -pinpoint red spots on the skin -red or dark-brown urine -shortness of breath or breathing problems -stomach or side pain, or pain at the shoulder -swelling -tiredness -trouble passing urine or change in the amount of urine Side effects that usually do not require medical attention (report to your doctor or health care professional if they continue or are bothersome): -bone pain -muscle pain This list may not describe all possible side effects. Call your doctor for medical advice about side effects. You may report side effects to FDA at 1-800-FDA-1088. Where should I keep my medicine? Keep out of the reach of children. Store pre-filled syringes in a refrigerator between 2 and 8 degrees C (36 and 46 degrees F). Do not freeze. Keep in carton to protect from light. Throw away this medicine if it is left out of the refrigerator for more than 48 hours. Throw away any unused medicine after the expiration date. NOTE: This sheet is a summary. It may not cover all possible information. If you have questions about this medicine, talk to your doctor, pharmacist, or health care provider.    2016, Elsevier/Gold Standard. (2014-07-19 14:30:14)

## 2016-04-03 NOTE — Progress Notes (Signed)
OK to treat with ANC 1.1 today per Verita Lamb per MD [name].

## 2016-04-03 NOTE — Progress Notes (Signed)
Per Dr. [name], ok to treat with ANC 1.1.  Infusion RN to ensure pt receives neulasta

## 2016-04-04 ENCOUNTER — Encounter: Payer: Self-pay | Admitting: Hematology and Oncology

## 2016-04-04 NOTE — Progress Notes (Signed)
Patient Care Team: Pcp Not In System as PCP - General [name], MD as Consulting Physician (General Surgery) Nicholas Lose, MD as Consulting Physician (Hematology and Oncology) Kyung Rudd, MD as Consulting Physician (Radiation Oncology)  DIAGNOSIS: Breast cancer of upper-outer quadrant of left female breast [facility])   Staging form: Breast, AJCC 7th Edition   - Clinical stage from 07/17/2015: Stage IIIA (T3, N1, M0) - Unsigned         Staging comments: Staged at breast conference on 1.4.17  SUMMARY OF ONCOLOGIC HISTORY:   Breast cancer of upper-outer quadrant of left female breast ([place])   07/02/2015 Mammogram    Left breast mass 5.3 x 5.2 x 2.7 cm at 1:30 position, multiple abnormal enlarged left axillary lymph nodes      07/09/2015 Initial Diagnosis    Left breast biopsy 1:30: IDC grade 3, ER PR 0%, HER-2 negative ratio 1.65, Ki-67 90%; left axillary lymph node biopsy: IDC grade 3 completely replacing the lymph node ER PR 0%, HER-2 negative ratio 1.22 Ki-67 90%      07/25/2015 -  Chemotherapy    Palliative chemotherapy: Carboplatin and gemcitabine day 1 and day 8 every 3 weeks ( metastatic breast cancer with extensive liver metastases and malignant recurrent ascites), switched to gemcitabine every 2 weeks      12/02/2015 Imaging    CT CAP: Decrease in the liver metastases now measuring 7 x 5 cm was previously 8.4 x 9.3 cm.      03/05/2016 Imaging    CT CAP: Interval decrease in the heterogeneous posterior right lower lobe mass from 6.5 x 6.1 cm to 6.2 x 4.8 cm       CHIEF COMPLIANT: Gemcitabine palliative chemo  INTERVAL HISTORY: Dawn Haynes is a 35 yr old with above history of Met BC currently on palliative chemo with Gmzar.  She missed the treatment 2 weeks ago for family issues. TOday her [place] is 1.1 Denies fevers  REVIEW OF SYSTEMS:   Constitutional: Denies fevers, chills or abnormal weight loss Eyes: Denies blurriness of vision Ears, nose, mouth, throat, and face:  Denies mucositis or sore throat Respiratory: Denies cough, dyspnea or wheezes Cardiovascular: Denies palpitation, chest discomfort Gastrointestinal:  Denies nausea, heartburn or change in bowel habits Skin: Denies abnormal skin rashes Lymphatics: Denies new lymphadenopathy or easy bruising Neurological:Denies numbness, tingling or new weaknesses Behavioral/Psych: Mood is stable, no new changes  Extremities: No lower extremity edema Breast: denies any pain or lumps or nodules in either breasts All other systems were reviewed with the patient and are negative.  I have reviewed the past medical history, past surgical history, social history and family history with the patient and they are unchanged from previous note.  ALLERGIES:  has No Known Allergies.  MEDICATIONS:  Current Outpatient Prescriptions  Medication Sig Dispense Refill  . BORON PO Take 1 tablet by mouth 3 (three) times daily as needed (gas/flatulence).    . Cholecalciferol (VITAMIN D) 2000 units CAPS Take 2,000 Units by mouth daily.     . ondansetron (ZOFRAN) 4 MG tablet Take 1 tablet (4 mg total) by mouth every 8 (eight) hours as needed for nausea or vomiting. 30 tablet 0  . oxyCODONE (OXY IR/ROXICODONE) 5 MG immediate release tablet Take 1 tablet (5 mg total) by mouth every 4 (four) hours as needed for moderate pain. 60 tablet 0  . polyethylene glycol (MIRALAX / GLYCOLAX) packet Take 17 g by mouth daily. May increase to 34 g daily if needed 30 each  0  . Probiotic Product (SOLUBLE FIBER/PROBIOTICS PO) Take 1 tablet by mouth daily.     Marland Kitchen senna-docusate (SENOKOT-S) 8.6-50 MG tablet Take 2 tablets by mouth daily. 20 tablet 0  . vitamin B-12 (CYANOCOBALAMIN) 100 MCG tablet Take 100 mcg by mouth daily.     No current facility-administered medications for this visit.     PHYSICAL EXAMINATION: ECOG PERFORMANCE STATUS: 1  Vitals:   04/03/16 1112  BP: 121/70  Pulse: (!) 119  Resp: 18  Temp: 99.3 F (37.4 C)   Filed  Weights   04/03/16 1112  Weight: 135 lb 1.6 oz (61.3 kg)    GENERAL:alert, no distress and comfortable SKIN: skin color, texture, turgor are normal, no rashes or significant lesions EYES: normal, Conjunctiva are pink and non-injected, sclera clear OROPHARYNX:no exudate, no erythema and lips, buccal mucosa, and tongue normal  NECK: supple, thyroid normal size, non-tender, without nodularity LYMPH:  no palpable lymphadenopathy in the cervical, axillary or inguinal LUNGS: clear to auscultation and percussion with normal breathing effort HEART: regular rate & rhythm and no murmurs and no lower extremity edema ABDOMEN:abdomen soft, non-tender and normal bowel sounds MUSCULOSKELETAL:no cyanosis of digits and no clubbing  NEURO: alert & oriented x 3 with fluent speech, no focal motor/sensory deficits EXTREMITIES: No lower extremity edema  LABORATORY DATA:  I have reviewed the data as listed   Chemistry      Component Value Date/Time   NA 139 04/03/2016 1102   K 3.7 04/03/2016 1102   CL 97 (L) 08/02/2015 2242   CO2 27 04/03/2016 1102   BUN 9.2 04/03/2016 1102   CREATININE 0.8 04/03/2016 1102      Component Value Date/Time   CALCIUM 9.4 04/03/2016 1102   ALKPHOS 118 04/03/2016 1102   AST 28 04/03/2016 1102   ALT 21 04/03/2016 1102   BILITOT 0.82 04/03/2016 1102       Lab Results  Component Value Date   WBC 3.0 (L) 04/03/2016   HGB 11.2 (L) 04/03/2016   HCT 35.3 04/03/2016   MCV 97.4 04/03/2016   PLT 225 04/03/2016   NEUTROABS 1.1 (L) 04/03/2016     ASSESSMENT & PLAN:  Breast cancer of upper-outer quadrant of left female breast ([facility]) 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, Ki-67 90%;  left axillary lymph node biopsy: IDC grade 3 completely replacing the lymph node ER PR 0%, HER-2 negative ratio 1.22 Ki-67 90%  Left breast mass 5.3 x 5.2 x 2.7 cm at 1:30 position, multiple abnormal enlarged left axillary lymph nodes   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  1. Normocytic anemia: Probably related to chemotherapy as well as anemia of chronic disease. We will have to monitor closely.  2. Neutropenia: We changed her chemotherapy regimen to gemcitabine every 2 weeks starting 12/27/2015. Today ANC is 1.1, she will get Neulasta. Will need to watch closely  Recurrent malignant ascites: Peritoneal catheter has been removed.   Elevated liver function tests: Related to extensive liver metastases. AST levels are slowly coming down. We will have to watch her very closely. Alkaline phosphatase and total bilirubin  have normalized   Severe hypoalbuminemia: Patient is improving her protein consumption.   CT chest abdomen pelvis 12/02/2015: No metastases in the chest, largest confluent area right lobe of the liver 7 x 5 cm significantly smaller (was 8.4 x 9.3 cm)  CT CAP 03/05/2016: Interval decrease in the heterogeneous posterior right lower lobe mass from 6.5 x 6.1 cm to 6.2 x 4.8 cm  she will return every 2 weeks for gemcitabine maintenance and every 4 weeks for follow-up in the clinic.   No orders of the defined types were placed in this encounter.  The patient has a good understanding of the overall plan. she agrees with it. she will call with any problems that may develop before the next visit here.   Rulon Eisenmenger,  MD 04/04/16

## 2016-04-04 NOTE — Assessment & Plan Note (Signed)
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  1. Normocytic anemia: Probably related to chemotherapy as well as anemia of chronic disease. We will have to monitor closely.  2. Neutropenia: We changed her chemotherapy regimen to gemcitabine every 2 weeks starting 12/27/2015.   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   Elevated liver function tests: Related to extensive liver metastases. AST levels are slowly coming down. We will have to watch her very closely. Alkaline phosphatase and total bilirubin have normalized   Severe hypoalbuminemia: Patient is improving her protein consumption.   CT chest abdomen pelvis 12/02/2015: No metastases in the chest, largest confluent area right lobe of the liver 7 x 5 cm significantly smaller (was 8.4 x 9.3 cm)  CT CAP 03/05/2016: Interval decrease in the heterogeneous posterior right lower lobe mass from 6.5 x 6.1 cm to 6.2 x 4.8 cm  she will return every 2 weeks for gemcitabine maintenance and every 4 weeks for  follow-up in the clinic. 

## 2016-04-17 ENCOUNTER — Ambulatory Visit (HOSPITAL_BASED_OUTPATIENT_CLINIC_OR_DEPARTMENT_OTHER): Payer: Medicaid Other

## 2016-04-17 ENCOUNTER — Other Ambulatory Visit (HOSPITAL_BASED_OUTPATIENT_CLINIC_OR_DEPARTMENT_OTHER): Payer: Medicaid Other

## 2016-04-17 DIAGNOSIS — Z5111 Encounter for antineoplastic chemotherapy: Secondary | ICD-10-CM

## 2016-04-17 DIAGNOSIS — C50412 Malignant neoplasm of upper-outer quadrant of left female breast: Secondary | ICD-10-CM

## 2016-04-17 DIAGNOSIS — D701 Agranulocytosis secondary to cancer chemotherapy: Secondary | ICD-10-CM

## 2016-04-17 LAB — CBC WITH DIFFERENTIAL/PLATELET
BASO%: 0.1 % (ref 0.0–2.0)
BASOS ABS: 0 10*3/uL (ref 0.0–0.1)
EOS ABS: 0 10*3/uL (ref 0.0–0.5)
EOS%: 0.6 % (ref 0.0–7.0)
HEMATOCRIT: 35.7 % (ref 34.8–46.6)
HGB: 11.4 g/dL — ABNORMAL LOW (ref 11.6–15.9)
LYMPH#: 1.6 10*3/uL (ref 0.9–3.3)
LYMPH%: 44.8 % (ref 14.0–49.7)
MCH: 31.1 pg (ref 25.1–34.0)
MCHC: 31.9 g/dL (ref 31.5–36.0)
MCV: 97.6 fL (ref 79.5–101.0)
MONO#: 0.3 10*3/uL (ref 0.1–0.9)
MONO%: 8.6 % (ref 0.0–14.0)
NEUT#: 1.7 10*3/uL (ref 1.5–6.5)
NEUT%: 45.9 % (ref 38.4–76.8)
PLATELETS: 155 10*3/uL (ref 145–400)
RBC: 3.66 10*6/uL — AB (ref 3.70–5.45)
RDW: 16 % — AB (ref 11.2–14.5)
WBC: 3.6 10*3/uL — AB (ref 3.9–10.3)

## 2016-04-17 LAB — COMPREHENSIVE METABOLIC PANEL
ALT: 18 U/L (ref 0–55)
ANION GAP: 8 meq/L (ref 3–11)
AST: 23 U/L (ref 5–34)
Albumin: 4 g/dL (ref 3.5–5.0)
Alkaline Phosphatase: 131 U/L (ref 40–150)
BILIRUBIN TOTAL: 0.5 mg/dL (ref 0.20–1.20)
BUN: 11.1 mg/dL (ref 7.0–26.0)
CALCIUM: 9.4 mg/dL (ref 8.4–10.4)
CHLORIDE: 104 meq/L (ref 98–109)
CO2: 28 mEq/L (ref 22–29)
CREATININE: 0.8 mg/dL (ref 0.6–1.1)
Glucose: 103 mg/dl (ref 70–140)
Potassium: 4.2 mEq/L (ref 3.5–5.1)
Sodium: 140 mEq/L (ref 136–145)
Total Protein: 7.4 g/dL (ref 6.4–8.3)

## 2016-04-17 MED ORDER — PROCHLORPERAZINE MALEATE 10 MG PO TABS
ORAL_TABLET | ORAL | Status: AC
  Filled 2016-04-17: qty 1

## 2016-04-17 MED ORDER — HEPARIN SOD (PORK) LOCK FLUSH 100 UNIT/ML IV SOLN
500.0000 [IU] | Freq: Once | INTRAVENOUS | Status: AC | PRN
Start: 2016-04-17 — End: 2016-04-17
  Administered 2016-04-17: 500 [IU]
  Filled 2016-04-17: qty 5

## 2016-04-17 MED ORDER — PEGFILGRASTIM 6 MG/0.6ML ~~LOC~~ PSKT
6.0000 mg | PREFILLED_SYRINGE | Freq: Once | SUBCUTANEOUS | Status: AC
  Administered 2016-04-17: 6 mg via SUBCUTANEOUS
  Filled 2016-04-17: qty 0.6

## 2016-04-17 MED ORDER — GEMCITABINE HCL CHEMO INJECTION 1 GM/26.3ML
1300.0000 mg | Freq: Once | INTRAVENOUS | Status: AC
  Administered 2016-04-17: 1300 mg via INTRAVENOUS
  Filled 2016-04-17: qty 34.19

## 2016-04-17 MED ORDER — SODIUM CHLORIDE 0.9% FLUSH
10.0000 mL | INTRAVENOUS | Status: DC | PRN
  Administered 2016-04-17: 10 mL
  Filled 2016-04-17: qty 10

## 2016-04-17 MED ORDER — PROCHLORPERAZINE MALEATE 10 MG PO TABS
10.0000 mg | ORAL_TABLET | Freq: Once | ORAL | Status: AC
  Administered 2016-04-17: 10 mg via ORAL

## 2016-04-17 MED ORDER — SODIUM CHLORIDE 0.9 % IV SOLN
Freq: Once | INTRAVENOUS | Status: AC
  Administered 2016-04-17: 11:00:00 via INTRAVENOUS

## 2016-04-17 NOTE — Patient Instructions (Signed)
[facility] Discharge Instructions for Patients Receiving Chemotherapy  Today you received the following chemotherapy agents Gemzar.  To help prevent nausea and vomiting after your treatment, we encourage you to take your nausea medication.   If you develop nausea and vomiting that is not controlled by your nausea medication, call the clinic.   BELOW ARE SYMPTOMS THAT SHOULD BE REPORTED IMMEDIATELY:  *FEVER GREATER THAN 100.5 F  *CHILLS WITH OR WITHOUT FEVER  NAUSEA AND VOMITING THAT IS NOT CONTROLLED WITH YOUR NAUSEA MEDICATION  *UNUSUAL SHORTNESS OF BREATH  *UNUSUAL BRUISING OR BLEEDING  TENDERNESS IN MOUTH AND THROAT WITH OR WITHOUT PRESENCE OF ULCERS  *URINARY PROBLEMS  *BOWEL PROBLEMS  UNUSUAL RASH Items with * indicate a potential emergency and should be followed up as soon as possible.  Feel free to call the clinic you have any questions or concerns. The clinic phone number is [phone].  Please show the CHEMO ALERT CARD at check-in to the Emergency Department and triage nurse.   

## 2016-05-01 ENCOUNTER — Ambulatory Visit (HOSPITAL_BASED_OUTPATIENT_CLINIC_OR_DEPARTMENT_OTHER): Payer: Medicaid Other

## 2016-05-01 ENCOUNTER — Ambulatory Visit: Payer: Medicaid Other | Admitting: Hematology and Oncology

## 2016-05-01 ENCOUNTER — Other Ambulatory Visit (HOSPITAL_BASED_OUTPATIENT_CLINIC_OR_DEPARTMENT_OTHER): Payer: Medicaid Other

## 2016-05-01 DIAGNOSIS — R180 Malignant ascites: Secondary | ICD-10-CM

## 2016-05-01 DIAGNOSIS — D6481 Anemia due to antineoplastic chemotherapy: Secondary | ICD-10-CM | POA: Diagnosis not present

## 2016-05-01 DIAGNOSIS — C773 Secondary and unspecified malignant neoplasm of axilla and upper limb lymph nodes: Secondary | ICD-10-CM

## 2016-05-01 DIAGNOSIS — C50412 Malignant neoplasm of upper-outer quadrant of left female breast: Secondary | ICD-10-CM

## 2016-05-01 DIAGNOSIS — Z171 Estrogen receptor negative status [ER-]: Secondary | ICD-10-CM

## 2016-05-01 DIAGNOSIS — R770 Abnormality of albumin: Secondary | ICD-10-CM | POA: Diagnosis not present

## 2016-05-01 DIAGNOSIS — C787 Secondary malignant neoplasm of liver and intrahepatic bile duct: Secondary | ICD-10-CM

## 2016-05-01 DIAGNOSIS — Z5111 Encounter for antineoplastic chemotherapy: Secondary | ICD-10-CM

## 2016-05-01 DIAGNOSIS — D701 Agranulocytosis secondary to cancer chemotherapy: Secondary | ICD-10-CM | POA: Diagnosis not present

## 2016-05-01 LAB — CBC WITH DIFFERENTIAL/PLATELET
BASO%: 0.2 % (ref 0.0–2.0)
BASOS ABS: 0 10*3/uL (ref 0.0–0.1)
EOS ABS: 0 10*3/uL (ref 0.0–0.5)
EOS%: 0.7 % (ref 0.0–7.0)
HCT: 36.6 % (ref 34.8–46.6)
HEMOGLOBIN: 11.8 g/dL (ref 11.6–15.9)
LYMPH%: 43.7 % (ref 14.0–49.7)
MCH: 31.2 pg (ref 25.1–34.0)
MCHC: 32.2 g/dL (ref 31.5–36.0)
MCV: 96.8 fL (ref 79.5–101.0)
MONO#: 0.5 10*3/uL (ref 0.1–0.9)
MONO%: 10.3 % (ref 0.0–14.0)
NEUT#: 2 10*3/uL (ref 1.5–6.5)
NEUT%: 45.1 % (ref 38.4–76.8)
Platelets: 159 10*3/uL (ref 145–400)
RBC: 3.78 10*6/uL (ref 3.70–5.45)
RDW: 15.9 % — ABNORMAL HIGH (ref 11.2–14.5)
WBC: 4.5 10*3/uL (ref 3.9–10.3)
lymph#: 2 10*3/uL (ref 0.9–3.3)

## 2016-05-01 LAB — COMPREHENSIVE METABOLIC PANEL
ALBUMIN: 4 g/dL (ref 3.5–5.0)
ALK PHOS: 156 U/L — AB (ref 40–150)
ALT: 27 U/L (ref 0–55)
AST: 32 U/L (ref 5–34)
Anion Gap: 10 mEq/L (ref 3–11)
BUN: 10.4 mg/dL (ref 7.0–26.0)
CO2: 26 mEq/L (ref 22–29)
Calcium: 9.7 mg/dL (ref 8.4–10.4)
Chloride: 103 mEq/L (ref 98–109)
Creatinine: 0.9 mg/dL (ref 0.6–1.1)
GLUCOSE: 86 mg/dL (ref 70–140)
POTASSIUM: 4.3 meq/L (ref 3.5–5.1)
SODIUM: 139 meq/L (ref 136–145)
Total Bilirubin: 0.52 mg/dL (ref 0.20–1.20)
Total Protein: 7.6 g/dL (ref 6.4–8.3)

## 2016-05-01 MED ORDER — PEGFILGRASTIM 6 MG/0.6ML ~~LOC~~ PSKT
6.0000 mg | PREFILLED_SYRINGE | Freq: Once | SUBCUTANEOUS | Status: AC
  Administered 2016-05-01: 6 mg via SUBCUTANEOUS
  Filled 2016-05-01: qty 0.6

## 2016-05-01 MED ORDER — PROCHLORPERAZINE MALEATE 10 MG PO TABS
10.0000 mg | ORAL_TABLET | Freq: Once | ORAL | Status: AC
  Administered 2016-05-01: 10 mg via ORAL

## 2016-05-01 MED ORDER — HEPARIN SOD (PORK) LOCK FLUSH 100 UNIT/ML IV SOLN
500.0000 [IU] | Freq: Once | INTRAVENOUS | Status: AC | PRN
  Administered 2016-05-01: 500 [IU]
  Filled 2016-05-01: qty 5

## 2016-05-01 MED ORDER — SODIUM CHLORIDE 0.9% FLUSH
10.0000 mL | INTRAVENOUS | Status: DC | PRN
  Administered 2016-05-01: 10 mL
  Filled 2016-05-01: qty 10

## 2016-05-01 MED ORDER — PROCHLORPERAZINE MALEATE 10 MG PO TABS
ORAL_TABLET | ORAL | Status: AC
  Filled 2016-05-01: qty 1

## 2016-05-01 MED ORDER — GEMCITABINE HCL CHEMO INJECTION 1 GM/26.3ML
1300.0000 mg | Freq: Once | INTRAVENOUS | Status: AC
  Administered 2016-05-01: 1300 mg via INTRAVENOUS
  Filled 2016-05-01: qty 34.19

## 2016-05-01 MED ORDER — SODIUM CHLORIDE 0.9 % IV SOLN
Freq: Once | INTRAVENOUS | Status: AC
  Administered 2016-05-01: 12:00:00 via INTRAVENOUS

## 2016-05-01 NOTE — Assessment & Plan Note (Signed)
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  1. Normocytic anemia: Probably related to chemotherapy as well as anemia of chronic disease. We will have to monitor closely.  2. Neutropenia: We changed her chemotherapy regimen to gemcitabine every 2 weeks starting 12/27/2015.   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   Elevated liver function tests: Related to extensive liver metastases. AST levels are slowly coming down. We will have to watch her very closely. Alkaline phosphatase and total bilirubin have normalized   Severe hypoalbuminemia: Patient is improving her protein consumption.   CT chest abdomen pelvis 12/02/2015: No metastases in the chest, largest confluent area right lobe of the liver 7 x 5 cm significantly smaller (was 8.4 x 9.3 cm)  CT CAP 03/05/2016: Interval decrease in the heterogeneous posterior right lower lobe mass from 6.5 x 6.1 cm to 6.2 x 4.8 cm  she will return every 2 weeks for gemcitabine maintenance and every 4 weeks for  follow-up in the clinic.

## 2016-05-01 NOTE — Progress Notes (Signed)
Patient Care Team: Pcp Not In System as PCP - General [name], MD as Consulting Physician (General Surgery) Nicholas Lose, MD as Consulting Physician (Hematology and Oncology) Kyung Rudd, MD as Consulting Physician (Radiation Oncology)  DIAGNOSIS:  Encounter Diagnosis  Name Primary?  . Malignant neoplasm of upper-outer quadrant of left breast in female, estrogen receptor negative ([place])     SUMMARY OF ONCOLOGIC HISTORY:   Breast cancer of upper-outer quadrant of left female breast ([place])   07/02/2015 Mammogram    Left breast mass 5.3 x 5.2 x 2.7 cm at 1:30 position, multiple abnormal enlarged left axillary lymph nodes      07/09/2015 Initial Diagnosis    Left breast biopsy 1:30: IDC grade 3, ER PR 0%, HER-2 negative ratio 1.65, Ki-67 90%; left axillary lymph node biopsy: IDC grade 3 completely replacing the lymph node ER PR 0%, HER-2 negative ratio 1.22 Ki-67 90%      07/25/2015 -  Chemotherapy    Palliative chemotherapy: Carboplatin and gemcitabine day 1 and day 8 every 3 weeks ( metastatic breast cancer with extensive liver metastases and malignant recurrent ascites), switched to gemcitabine every 2 weeks      12/02/2015 Imaging    CT CAP: Decrease in the liver metastases now measuring 7 x 5 cm was previously 8.4 x 9.3 cm.      03/05/2016 Imaging    CT CAP: Interval decrease in the heterogeneous posterior right lower lobe mass from 6.5 x 6.1 cm to 6.2 x 4.8 cm       CHIEF COMPLIANT: Palliative chemotherapy with gemcitabine  INTERVAL HISTORY: Dawn Haynes is a 35 year old above-mentioned symptoms metastatic breast cancer currently on palliative chemotherapy with gemcitabine. She is tolerating it extremely well. She does not have any new complaints or concerns. She does not have any abdominal pain nausea vomiting diarrhea constipation. She no longer has ascites.  REVIEW OF SYSTEMS:   Constitutional: Denies fevers, chills or abnormal weight loss Eyes: Denies blurriness  of vision Ears, nose, mouth, throat, and face: Denies mucositis or sore throat Respiratory: Denies cough, dyspnea or wheezes Cardiovascular: Denies palpitation, chest discomfort Gastrointestinal:  Denies nausea, heartburn or change in bowel habits Skin: Denies abnormal skin rashes Lymphatics: Denies new lymphadenopathy or easy bruising Neurological:Denies numbness, tingling or new weaknesses Behavioral/Psych: Mood is stable, no new changes  Extremities: No lower extremity edema  All other systems were reviewed with the patient and are negative.  I have reviewed the past medical history, past surgical history, social history and family history with the patient and they are unchanged from previous note.  ALLERGIES:  has No Known Allergies.  MEDICATIONS:  Current Outpatient Prescriptions  Medication Sig Dispense Refill  . BORON PO Take 1 tablet by mouth 3 (three) times daily as needed (gas/flatulence).    . Cholecalciferol (VITAMIN D) 2000 units CAPS Take 2,000 Units by mouth daily.     . ondansetron (ZOFRAN) 4 MG tablet Take 1 tablet (4 mg total) by mouth every 8 (eight) hours as needed for nausea or vomiting. 30 tablet 0  . oxyCODONE (OXY IR/ROXICODONE) 5 MG immediate release tablet Take 1 tablet (5 mg total) by mouth every 4 (four) hours as needed for moderate pain. 60 tablet 0  . polyethylene glycol (MIRALAX / GLYCOLAX) packet Take 17 g by mouth daily. May increase to 34 g daily if needed 30 each 0  . Probiotic Product (SOLUBLE FIBER/PROBIOTICS PO) Take 1 tablet by mouth daily.     Marland Kitchen senna-docusate (SENOKOT-S) 8.6-50  MG tablet Take 2 tablets by mouth daily. 20 tablet 0  . vitamin B-12 (CYANOCOBALAMIN) 100 MCG tablet Take 100 mcg by mouth daily.     No current facility-administered medications for this visit.     PHYSICAL EXAMINATION: ECOG PERFORMANCE STATUS: 1 - Symptomatic but completely ambulatory  Vitals:   05/01/16 1056  BP: 107/72  Pulse: 80  Resp: 18  Temp: 98.6 F (37  C)   Filed Weights   05/01/16 1056  Weight: 131 lb 14.4 oz (59.8 kg)    GENERAL:alert, no distress and comfortable SKIN: skin color, texture, turgor are normal, no rashes or significant lesions EYES: normal, Conjunctiva are pink and non-injected, sclera clear OROPHARYNX:no exudate, no erythema and lips, buccal mucosa, and tongue normal  NECK: supple, thyroid normal size, non-tender, without nodularity LYMPH:  no palpable lymphadenopathy in the cervical, axillary or inguinal LUNGS: clear to auscultation and percussion with normal breathing effort HEART: regular rate & rhythm and no murmurs and no lower extremity edema ABDOMEN:abdomen soft, non-tender and normal bowel sounds MUSCULOSKELETAL:no cyanosis of digits and no clubbing  NEURO: alert & oriented x 3 with fluent speech, no focal motor/sensory deficits EXTREMITIES: No lower extremity edema  LABORATORY DATA:  I have reviewed the data as listed   Chemistry      Component Value Date/Time   NA 140 04/17/2016 1031   K 4.2 04/17/2016 1031   CL 97 (L) 08/02/2015 2242   CO2 28 04/17/2016 1031   BUN 11.1 04/17/2016 1031   CREATININE 0.8 04/17/2016 1031      Component Value Date/Time   CALCIUM 9.4 04/17/2016 1031   ALKPHOS 131 04/17/2016 1031   AST 23 04/17/2016 1031   ALT 18 04/17/2016 1031   BILITOT 0.50 04/17/2016 1031       Lab Results  Component Value Date   WBC 4.5 05/01/2016   HGB 11.8 05/01/2016   HCT 36.6 05/01/2016   MCV 96.8 05/01/2016   PLT 159 05/01/2016   NEUTROABS 2.0 05/01/2016     ASSESSMENT & PLAN:  Breast cancer of upper-outer quadrant of left female breast ([facility]) 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, Ki-67 90%;  left axillary lymph node biopsy: IDC grade 3 completely replacing the lymph node ER PR 0%, HER-2 negative ratio 1.22 Ki-67 90%  Left breast mass 5.3 x 5.2 x 2.7 cm at 1:30 position, multiple abnormal enlarged left axillary lymph nodes   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  1. Normocytic anemia: Probably related to chemotherapy as well as anemia of chronic disease. We will have to monitor closely.  2. Neutropenia: We changed her chemotherapy regimen to gemcitabine every 2 weeks starting 12/27/2015.   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   Elevated liver function tests: Related to extensive liver metastases. AST levels are slowly coming down. We will have to watch her very closely. Alkaline phosphatase and total bilirubin have normalized   Severe hypoalbuminemia: Patient is improving her protein consumption.   CT chest abdomen pelvis 12/02/2015: No metastases in the chest, largest confluent area right lobe of the liver 7  x 5 cm significantly smaller (was 8.4 x 9.3 cm)  CT CAP 03/05/2016: Interval decrease in the heterogeneous posterior right lower lobe mass from 6.5 x 6.1 cm to 6.2 x 4.8 cm  she will return every 2 weeks for gemcitabine maintenance and every 4 weeks for follow-up in the clinic. I plan to obtain CT scans in January. If those scans show excellent results and we may consider changing her gemcitabine to once every 3 weeks.  No orders of the defined types were placed in this encounter.  The patient has a good understanding of the overall plan. she agrees with it. she will call with any  problems that may develop before the next visit here.   Rulon Eisenmenger, MD 05/01/16

## 2016-05-01 NOTE — Patient Instructions (Signed)
[facility] Discharge Instructions for Patients Receiving Chemotherapy  Today you received the following chemotherapy agents gemzar  To help prevent nausea and vomiting after your treatment, we encourage you to take your nausea medication as prescribed. If you develop nausea and vomiting that is not controlled by your nausea medication, call the clinic.   BELOW ARE SYMPTOMS THAT SHOULD BE REPORTED IMMEDIATELY:  *FEVER GREATER THAN 100.5 F  *CHILLS WITH OR WITHOUT FEVER  NAUSEA AND VOMITING THAT IS NOT CONTROLLED WITH YOUR NAUSEA MEDICATION  *UNUSUAL SHORTNESS OF BREATH  *UNUSUAL BRUISING OR BLEEDING  TENDERNESS IN MOUTH AND THROAT WITH OR WITHOUT PRESENCE OF ULCERS  *URINARY PROBLEMS  *BOWEL PROBLEMS  UNUSUAL RASH Items with * indicate a potential emergency and should be followed up as soon as possible.  Feel free to call the clinic you have any questions or concerns. The clinic phone number is [phone].  Please show the CHEMO ALERT CARD at check-in to the Emergency Department and triage nurse.   

## 2016-05-15 ENCOUNTER — Other Ambulatory Visit (HOSPITAL_BASED_OUTPATIENT_CLINIC_OR_DEPARTMENT_OTHER): Payer: Medicaid Other

## 2016-05-15 ENCOUNTER — Ambulatory Visit (HOSPITAL_BASED_OUTPATIENT_CLINIC_OR_DEPARTMENT_OTHER): Payer: Medicaid Other

## 2016-05-15 VITALS — BP 104/74 | HR 63 | Temp 98.6°F | Resp 18

## 2016-05-15 DIAGNOSIS — C50412 Malignant neoplasm of upper-outer quadrant of left female breast: Secondary | ICD-10-CM

## 2016-05-15 DIAGNOSIS — D701 Agranulocytosis secondary to cancer chemotherapy: Secondary | ICD-10-CM

## 2016-05-15 DIAGNOSIS — Z5111 Encounter for antineoplastic chemotherapy: Secondary | ICD-10-CM

## 2016-05-15 LAB — CBC WITH DIFFERENTIAL/PLATELET
BASO%: 0.4 % (ref 0.0–2.0)
Basophils Absolute: 0 10*3/uL (ref 0.0–0.1)
EOS%: 0.5 % (ref 0.0–7.0)
Eosinophils Absolute: 0 10*3/uL (ref 0.0–0.5)
HCT: 34.7 % — ABNORMAL LOW (ref 34.8–46.6)
HGB: 11.1 g/dL — ABNORMAL LOW (ref 11.6–15.9)
LYMPH%: 33.9 % (ref 14.0–49.7)
MCH: 31.3 pg (ref 25.1–34.0)
MCHC: 32.1 g/dL (ref 31.5–36.0)
MCV: 97.5 fL (ref 79.5–101.0)
MONO#: 0.6 10*3/uL (ref 0.1–0.9)
MONO%: 10.4 % (ref 0.0–14.0)
NEUT#: 3 10*3/uL (ref 1.5–6.5)
NEUT%: 54.8 % (ref 38.4–76.8)
Platelets: 126 10*3/uL — ABNORMAL LOW (ref 145–400)
RBC: 3.56 10*6/uL — ABNORMAL LOW (ref 3.70–5.45)
RDW: 17.3 % — ABNORMAL HIGH (ref 11.2–14.5)
WBC: 5.4 10*3/uL (ref 3.9–10.3)
lymph#: 1.8 10*3/uL (ref 0.9–3.3)

## 2016-05-15 LAB — COMPREHENSIVE METABOLIC PANEL
ALT: 25 U/L (ref 0–55)
AST: 26 U/L (ref 5–34)
Albumin: 3.8 g/dL (ref 3.5–5.0)
Alkaline Phosphatase: 157 U/L — ABNORMAL HIGH (ref 40–150)
Anion Gap: 7 mEq/L (ref 3–11)
BUN: 11.9 mg/dL (ref 7.0–26.0)
CO2: 29 mEq/L (ref 22–29)
Calcium: 9.2 mg/dL (ref 8.4–10.4)
Chloride: 103 mEq/L (ref 98–109)
Creatinine: 0.8 mg/dL (ref 0.6–1.1)
EGFR: >90 mL/min/{1.73_m2} (ref >90)
Glucose: 79 mg/dl (ref 70–140)
Potassium: 3.8 mEq/L (ref 3.5–5.1)
Sodium: 139 mEq/L (ref 136–145)
Total Bilirubin: 0.46 mg/dL (ref 0.20–1.20)
Total Protein: 7.3 g/dL (ref 6.4–8.3)

## 2016-05-15 MED ORDER — PROCHLORPERAZINE MALEATE 10 MG PO TABS
10.0000 mg | ORAL_TABLET | Freq: Once | ORAL | Status: AC
  Administered 2016-05-15: 10 mg via ORAL

## 2016-05-15 MED ORDER — HEPARIN SOD (PORK) LOCK FLUSH 100 UNIT/ML IV SOLN
500.0000 [IU] | Freq: Once | INTRAVENOUS | Status: AC | PRN
Start: 2016-05-15 — End: 2016-05-15
  Administered 2016-05-15: 500 [IU]
  Filled 2016-05-15: qty 5

## 2016-05-15 MED ORDER — SODIUM CHLORIDE 0.9 % IV SOLN
1300.0000 mg | Freq: Once | INTRAVENOUS | Status: AC
  Administered 2016-05-15: 1300 mg via INTRAVENOUS
  Filled 2016-05-15: qty 34.19

## 2016-05-15 MED ORDER — SODIUM CHLORIDE 0.9% FLUSH
10.0000 mL | INTRAVENOUS | Status: DC | PRN
  Administered 2016-05-15: 10 mL
  Filled 2016-05-15: qty 10

## 2016-05-15 MED ORDER — PROCHLORPERAZINE MALEATE 10 MG PO TABS
ORAL_TABLET | ORAL | Status: AC
  Filled 2016-05-15: qty 1

## 2016-05-15 MED ORDER — PEGFILGRASTIM 6 MG/0.6ML ~~LOC~~ PSKT
6.0000 mg | PREFILLED_SYRINGE | Freq: Once | SUBCUTANEOUS | Status: AC
  Administered 2016-05-15: 6 mg via SUBCUTANEOUS
  Filled 2016-05-15: qty 0.6

## 2016-05-15 MED ORDER — SODIUM CHLORIDE 0.9 % IV SOLN
Freq: Once | INTRAVENOUS | Status: AC
  Administered 2016-05-15: 12:00:00 via INTRAVENOUS

## 2016-05-15 NOTE — Patient Instructions (Signed)
[facility] Discharge Instructions for Patients Receiving Chemotherapy  Today you received the following chemotherapy agents Gemzar  To help prevent nausea and vomiting after your treatment, we encourage you to take your nausea medication as directed.    If you develop nausea and vomiting that is not controlled by your nausea medication, call the clinic.   BELOW ARE SYMPTOMS THAT SHOULD BE REPORTED IMMEDIATELY:  *FEVER GREATER THAN 100.5 F  *CHILLS WITH OR WITHOUT FEVER  NAUSEA AND VOMITING THAT IS NOT CONTROLLED WITH YOUR NAUSEA MEDICATION  *UNUSUAL SHORTNESS OF BREATH  *UNUSUAL BRUISING OR BLEEDING  TENDERNESS IN MOUTH AND THROAT WITH OR WITHOUT PRESENCE OF ULCERS  *URINARY PROBLEMS  *BOWEL PROBLEMS  UNUSUAL RASH Items with * indicate a potential emergency and should be followed up as soon as possible.  Feel free to call the clinic you have any questions or concerns. The clinic phone number is [phone].  Please show the CHEMO ALERT CARD at check-in to the Emergency Department and triage nurse.   

## 2016-05-28 NOTE — Assessment & Plan Note (Deleted)
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  1. Normocytic anemia: Probably related to chemotherapy as well as anemia of chronic disease. We will have to monitor closely.  2. Neutropenia: We changed her chemotherapy regimen to gemcitabine every 2 weeks starting 12/27/2015.   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   Elevated liver function tests: Related to extensive liver metastases. AST levels are slowly coming down. We will have to watch her very closely. Alkaline phosphatase and total bilirubin have normalized   Severe hypoalbuminemia: Patient is improving her protein consumption.   CT chest abdomen pelvis 12/02/2015: No metastases in the chest, largest confluent area right lobe of the liver 7 x 5 cm significantly smaller (was 8.4 x 9.3 cm)  CT CAP 03/05/2016: Interval decrease in the heterogeneous posterior right lower lobe mass from 6.5 x 6.1 cm to 6.2 x 4.8 cm  she will return every 2 weeks for gemcitabine maintenance and every 4 weeks for  follow-up in the clinic. I plan to obtain CT scans in January. If those scans show excellent results and we may consider changing her gemcitabine to once every 3 weeks

## 2016-05-29 ENCOUNTER — Ambulatory Visit: Payer: Medicaid Other

## 2016-05-29 ENCOUNTER — Ambulatory Visit: Payer: Medicaid Other | Admitting: Hematology and Oncology

## 2016-05-29 ENCOUNTER — Other Ambulatory Visit: Payer: Medicaid Other

## 2016-06-12 ENCOUNTER — Ambulatory Visit: Payer: Medicaid Other

## 2016-06-12 ENCOUNTER — Other Ambulatory Visit (HOSPITAL_BASED_OUTPATIENT_CLINIC_OR_DEPARTMENT_OTHER): Payer: Medicaid Other

## 2016-06-12 DIAGNOSIS — C50412 Malignant neoplasm of upper-outer quadrant of left female breast: Secondary | ICD-10-CM | POA: Diagnosis present

## 2016-06-12 LAB — CBC WITH DIFFERENTIAL/PLATELET
BASO%: 0.6 % (ref 0.0–2.0)
Basophils Absolute: 0 10*3/uL (ref 0.0–0.1)
EOS ABS: 0 10*3/uL (ref 0.0–0.5)
EOS%: 0.8 % (ref 0.0–7.0)
HCT: 34.8 % (ref 34.8–46.6)
HEMOGLOBIN: 11.3 g/dL — AB (ref 11.6–15.9)
LYMPH%: 48.4 % (ref 14.0–49.7)
MCH: 31.4 pg (ref 25.1–34.0)
MCHC: 32.4 g/dL (ref 31.5–36.0)
MCV: 96.9 fL (ref 79.5–101.0)
MONO#: 0.5 10*3/uL (ref 0.1–0.9)
MONO%: 14.6 % — AB (ref 0.0–14.0)
NEUT%: 35.6 % — ABNORMAL LOW (ref 38.4–76.8)
NEUTROS ABS: 1.2 10*3/uL — AB (ref 1.5–6.5)
Platelets: 252 10*3/uL (ref 145–400)
RBC: 3.59 10*6/uL — ABNORMAL LOW (ref 3.70–5.45)
RDW: 17.1 % — AB (ref 11.2–14.5)
WBC: 3.2 10*3/uL — AB (ref 3.9–10.3)
lymph#: 1.6 10*3/uL (ref 0.9–3.3)

## 2016-06-12 LAB — COMPREHENSIVE METABOLIC PANEL
ALBUMIN: 3.7 g/dL (ref 3.5–5.0)
ALK PHOS: 98 U/L (ref 40–150)
ALT: 23 U/L (ref 0–55)
AST: 26 U/L (ref 5–34)
Anion Gap: 8 mEq/L (ref 3–11)
BILIRUBIN TOTAL: 0.67 mg/dL (ref 0.20–1.20)
BUN: 10.3 mg/dL (ref 7.0–26.0)
CO2: 26 meq/L (ref 22–29)
Calcium: 9.7 mg/dL (ref 8.4–10.4)
Chloride: 104 mEq/L (ref 98–109)
Creatinine: 0.8 mg/dL (ref 0.6–1.1)
GLUCOSE: 89 mg/dL (ref 70–140)
Potassium: 4.4 mEq/L (ref 3.5–5.1)
SODIUM: 138 meq/L (ref 136–145)
TOTAL PROTEIN: 7.2 g/dL (ref 6.4–8.3)

## 2016-06-12 NOTE — Progress Notes (Signed)
Pt requesting to skip treatment today. Pt states that she feels that her body needs a break from chemo and that she still wants to obtain her labwork to make sure everything is ok. Dr.Gudena made aware and ok to skip treatment for today. Infusion charge nurse aware of cancellation. Pt wants to keep next treatment appt as scheduled.

## 2016-06-25 NOTE — Assessment & Plan Note (Signed)
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  1. Normocytic anemia: Probably related to chemotherapy as well as anemia of chronic disease. We will have to monitor closely.  2. Neutropenia: We changed her chemotherapy regimen to gemcitabine every 2 weeks starting 12/27/2015.   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   Elevated liver function tests: Related to extensive liver metastases. AST levels are slowly coming down. We will have to watch her very closely. Alkaline phosphatase and total bilirubin have normalized   Severe hypoalbuminemia: Patient is improving her protein consumption.   CT chest abdomen pelvis 12/02/2015: No metastases in the chest, largest confluent area right lobe of the liver 7 x 5 cm significantly smaller (was 8.4 x 9.3 cm)  CT CAP 03/05/2016: Interval decrease in the heterogeneous posterior right lower lobe mass from 6.5 x 6.1 cm to 6.2 x 4.8 cm  she will return every 2 weeks for gemcitabine maintenance and every 4 weeks for  follow-up in the clinic. I plan to obtain CT scans in January. If those scans show excellent results and we may consider changing her gemcitabine to once every 3 weeks 

## 2016-06-26 ENCOUNTER — Other Ambulatory Visit (HOSPITAL_BASED_OUTPATIENT_CLINIC_OR_DEPARTMENT_OTHER): Payer: Medicaid Other

## 2016-06-26 ENCOUNTER — Ambulatory Visit: Payer: Medicaid Other

## 2016-06-26 ENCOUNTER — Encounter: Payer: Self-pay | Admitting: Hematology and Oncology

## 2016-06-26 ENCOUNTER — Ambulatory Visit (HOSPITAL_BASED_OUTPATIENT_CLINIC_OR_DEPARTMENT_OTHER): Payer: Medicaid Other | Admitting: Hematology and Oncology

## 2016-06-26 DIAGNOSIS — Z171 Estrogen receptor negative status [ER-]: Secondary | ICD-10-CM

## 2016-06-26 DIAGNOSIS — C50412 Malignant neoplasm of upper-outer quadrant of left female breast: Secondary | ICD-10-CM | POA: Diagnosis not present

## 2016-06-26 DIAGNOSIS — C773 Secondary and unspecified malignant neoplasm of axilla and upper limb lymph nodes: Secondary | ICD-10-CM

## 2016-06-26 DIAGNOSIS — D6481 Anemia due to antineoplastic chemotherapy: Secondary | ICD-10-CM

## 2016-06-26 DIAGNOSIS — D701 Agranulocytosis secondary to cancer chemotherapy: Secondary | ICD-10-CM | POA: Diagnosis not present

## 2016-06-26 DIAGNOSIS — C787 Secondary malignant neoplasm of liver and intrahepatic bile duct: Secondary | ICD-10-CM | POA: Diagnosis not present

## 2016-06-26 LAB — COMPREHENSIVE METABOLIC PANEL
ALBUMIN: 3.9 g/dL (ref 3.5–5.0)
ALK PHOS: 93 U/L (ref 40–150)
ALT: 25 U/L (ref 0–55)
ANION GAP: 9 meq/L (ref 3–11)
AST: 27 U/L (ref 5–34)
BUN: 11.1 mg/dL (ref 7.0–26.0)
CALCIUM: 9.4 mg/dL (ref 8.4–10.4)
CHLORIDE: 105 meq/L (ref 98–109)
CO2: 26 mEq/L (ref 22–29)
Creatinine: 0.8 mg/dL (ref 0.6–1.1)
Glucose: 85 mg/dl (ref 70–140)
POTASSIUM: 3.7 meq/L (ref 3.5–5.1)
Sodium: 140 mEq/L (ref 136–145)
Total Bilirubin: 0.65 mg/dL (ref 0.20–1.20)
Total Protein: 7.6 g/dL (ref 6.4–8.3)

## 2016-06-26 LAB — CBC WITH DIFFERENTIAL/PLATELET
BASO%: 0.7 % (ref 0.0–2.0)
BASOS ABS: 0 10*3/uL (ref 0.0–0.1)
EOS ABS: 0 10*3/uL (ref 0.0–0.5)
EOS%: 1.6 % (ref 0.0–7.0)
HEMATOCRIT: 38.8 % (ref 34.8–46.6)
HEMOGLOBIN: 12.3 g/dL (ref 11.6–15.9)
LYMPH#: 1.4 10*3/uL (ref 0.9–3.3)
LYMPH%: 59.8 % — ABNORMAL HIGH (ref 14.0–49.7)
MCH: 30.6 pg (ref 25.1–34.0)
MCHC: 31.7 g/dL (ref 31.5–36.0)
MCV: 96.4 fL (ref 79.5–101.0)
MONO#: 0.2 10*3/uL (ref 0.1–0.9)
MONO%: 9.5 % (ref 0.0–14.0)
NEUT#: 0.7 10*3/uL — ABNORMAL LOW (ref 1.5–6.5)
NEUT%: 28.4 % — AB (ref 38.4–76.8)
PLATELETS: 158 10*3/uL (ref 145–400)
RBC: 4.03 10*6/uL (ref 3.70–5.45)
RDW: 14.8 % — AB (ref 11.2–14.5)
WBC: 2.4 10*3/uL — ABNORMAL LOW (ref 3.9–10.3)

## 2016-06-26 MED ORDER — FILGRASTIM 300 MCG/0.5ML IJ SOSY
300.0000 ug | PREFILLED_SYRINGE | Freq: Once | INTRAMUSCULAR | Status: DC
  Administered 2016-06-26: 300 ug via SUBCUTANEOUS
  Filled 2016-06-26: qty 0.5

## 2016-06-26 MED ORDER — TBO-FILGRASTIM 300 MCG/0.5ML ~~LOC~~ SOSY
300.0000 ug | PREFILLED_SYRINGE | Freq: Once | SUBCUTANEOUS | Status: DC
  Filled 2016-06-26: qty 0.5

## 2016-06-26 NOTE — Progress Notes (Signed)
Patient Care Team: Pcp Not In System as PCP - General [name], MD as Consulting Physician (General Surgery) Nicholas Lose, MD as Consulting Physician (Hematology and Oncology) Kyung Rudd, MD as Consulting Physician (Radiation Oncology)  DIAGNOSIS:  Encounter Diagnosis  Name Primary?  . Malignant neoplasm of upper-outer quadrant of left breast in female, estrogen receptor negative ([place])     SUMMARY OF ONCOLOGIC HISTORY:   Breast cancer of upper-outer quadrant of left female breast ([place])   07/02/2015 Mammogram    Left breast mass 5.3 x 5.2 x 2.7 cm at 1:30 position, multiple abnormal enlarged left axillary lymph nodes      07/09/2015 Initial Diagnosis    Left breast biopsy 1:30: IDC grade 3, ER PR 0%, HER-2 negative ratio 1.65, Ki-67 90%; left axillary lymph node biopsy: IDC grade 3 completely replacing the lymph node ER PR 0%, HER-2 negative ratio 1.22 Ki-67 90%      07/25/2015 -  Chemotherapy    Palliative chemotherapy: Carboplatin and gemcitabine day 1 and day 8 every 3 weeks ( metastatic breast cancer with extensive liver metastases and malignant recurrent ascites), switched to gemcitabine every 2 weeks      12/02/2015 Imaging    CT CAP: Decrease in the liver metastases now measuring 7 x 5 cm was previously 8.4 x 9.3 cm.      03/05/2016 Imaging    CT CAP: Interval decrease in the heterogeneous posterior right lower lobe mass from 6.5 x 6.1 cm to 6.2 x 4.8 cm       CHIEF COMPLIANT: Follow-up to discuss treatment plan.  INTERVAL HISTORY: Dawn Haynes is a 35 year old with above-mentioned history metastatic breast cancer with. No nail disease and liver metastases who received adjuvant chemotherapy with carboplatin and gemcitabine all the way up until November 2017. Since then she has voluntarily decided that she does not want to continue with the chemotherapy anymore. She is interested in evaluating surgical options for liver resection. She does not have any clinical  symptoms of ascites anymore. She feels really well and is without any abdominal pain nausea vomiting. She has had persistent cytopenias and today her ANC is still low.   REVIEW OF SYSTEMS:   Constitutional: Denies fevers, chills or abnormal weight loss Eyes: Denies blurriness of vision Ears, nose, mouth, throat, and face: Denies mucositis or sore throat Respiratory: Denies cough, dyspnea or wheezes Cardiovascular: Denies palpitation, chest discomfort Gastrointestinal:  Denies nausea, heartburn or change in bowel habits Skin: Denies abnormal skin rashes Lymphatics: Denies new lymphadenopathy or easy bruising Neurological:Denies numbness, tingling or new weaknesses Behavioral/Psych: Mood is stable, no new changes  Extremities: No lower extremity edema Breast:  denies any pain or lumps or nodules in either breasts All other systems were reviewed with the patient and are negative.  I have reviewed the past medical history, past surgical history, social history and family history with the patient and they are unchanged from previous note.  ALLERGIES:  has No Known Allergies.  MEDICATIONS:  Current Outpatient Prescriptions  Medication Sig Dispense Refill  . BORON PO Take 1 tablet by mouth 3 (three) times daily as needed (gas/flatulence).    . Cholecalciferol (VITAMIN D) 2000 units CAPS Take 2,000 Units by mouth daily.     . ondansetron (ZOFRAN) 4 MG tablet Take 1 tablet (4 mg total) by mouth every 8 (eight) hours as needed for nausea or vomiting. 30 tablet 0  . oxyCODONE (OXY IR/ROXICODONE) 5 MG immediate release tablet Take 1 tablet (5 mg total)  by mouth every 4 (four) hours as needed for moderate pain. 60 tablet 0  . polyethylene glycol (MIRALAX / GLYCOLAX) packet Take 17 g by mouth daily. May increase to 34 g daily if needed 30 each 0  . Probiotic Product (SOLUBLE FIBER/PROBIOTICS PO) Take 1 tablet by mouth daily.     Marland Kitchen senna-docusate (SENOKOT-S) 8.6-50 MG tablet Take 2 tablets by mouth  daily. 20 tablet 0  . vitamin B-12 (CYANOCOBALAMIN) 100 MCG tablet Take 100 mcg by mouth daily.     No current facility-administered medications for this visit.     PHYSICAL EXAMINATION: ECOG PERFORMANCE STATUS: 0 - Asymptomatic  Vitals:   06/26/16 0928  BP: 120/74  Pulse: 74  Resp: 18  Temp: 98 F (36.7 C)   Filed Weights   06/26/16 0928  Weight: 131 lb 14.4 oz (59.8 kg)    GENERAL:alert, no distress and comfortable SKIN: skin color, texture, turgor are normal, no rashes or significant lesions EYES: normal, Conjunctiva are pink and non-injected, sclera clear OROPHARYNX:no exudate, no erythema and lips, buccal mucosa, and tongue normal  NECK: supple, thyroid normal size, non-tender, without nodularity LYMPH:  no palpable lymphadenopathy in the cervical, axillary or inguinal LUNGS: clear to auscultation and percussion with normal breathing effort HEART: regular rate & rhythm and no murmurs and no lower extremity edema ABDOMEN:abdomen soft, non-tender and normal bowel sounds MUSCULOSKELETAL:no cyanosis of digits and no clubbing  NEURO: alert & oriented x 3 with fluent speech, no focal motor/sensory deficits EXTREMITIES: No lower extremity edema  LABORATORY DATA:  I have reviewed the data as listed   Chemistry      Component Value Date/Time   NA 140 06/26/2016 0913   K 3.7 06/26/2016 0913   CL 97 (L) 08/02/2015 2242   CO2 26 06/26/2016 0913   BUN 11.1 06/26/2016 0913   CREATININE 0.8 06/26/2016 0913      Component Value Date/Time   CALCIUM 9.4 06/26/2016 0913   ALKPHOS 93 06/26/2016 0913   AST 27 06/26/2016 0913   ALT 25 06/26/2016 0913   BILITOT 0.65 06/26/2016 0913       Lab Results  Component Value Date   WBC 2.4 (L) 06/26/2016   HGB 12.3 06/26/2016   HCT 38.8 06/26/2016   MCV 96.4 06/26/2016   PLT 158 06/26/2016   NEUTROABS 0.7 (L) 06/26/2016    ASSESSMENT & PLAN:  Breast cancer of upper-outer quadrant of left female breast ([facility]) Left breast  biopsy 1:30 on 07/09/2015: IDC grade 3, ER PR 0%, HER-2 negative ratio 1.65, Ki-67 90%;  left axillary lymph node biopsy: IDC grade 3 completely replacing the lymph node ER PR 0%, HER-2 negative ratio 1.22 Ki-67 90%  Left breast mass 5.3 x 5.2 x 2.7 cm at 1:30 position, multiple abnormal enlarged left axillary lymph nodes  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  1. Normocytic anemia: Probably related to chemotherapy as well as anemia of chronic disease. We will have to monitor closely.  2. Neutropenia: We changed her chemotherapy regimen to gemcitabine every 2 weeks starting 12/27/2015.   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 No more clinical ascites  Elevated liver function tests: Related to extensive liver metastases. AST levels have come down. Alkaline phosphatase and total bilirubin have normalized  Severe hypoalbuminemia: No further nutritional issues.   CT chest abdomen pelvis 12/02/2015: No metastases in the chest, largest confluent area right lobe of the liver 7 x 5 cm significantly smaller (was 8.4 x 9.3 cm) CT CAP 03/05/2016: Interval decrease in the heterogeneous posterior right lower lobe mass from 6.5 x 6.1 cm to 6.2 x 4.8 cm  Patient stopped gemcitabine treatment in November 2017. She would like to get a CT scan of the abdomen and pelvis and determine a surgical  resectability is an option for the liver. I discussed with her that it is not ideal to stop chemotherapy for triple negative disease as her cancer can come back and may not respond to the same treatment. But she is adamant about not resuming treatment. I will see her back in January after CT scans and follow-up.   Orders Placed This Encounter  Procedures  . CT Abdomen Pelvis W Contrast    Standing Status:   Future    Standing Expiration Date:   06/26/2017    Order Specific Question:   If indicated for the ordered procedure, I authorize the administration of contrast media per Radiology protocol    Answer:   Yes    Order Specific Question:   Reason for Exam (SYMPTOM  OR DIAGNOSIS REQUIRED)    Answer:   metastatic breast cancer restaging    Order Specific Question:   Is patient pregnant?    Answer:   No    Order Specific Question:   Preferred imaging location?    Answer:   [facility]  . CT Chest W Contrast    Standing Status:   Future    Standing Expiration Date:   06/26/2017    Order Specific Question:   If indicated for the ordered procedure, I authorize the administration of contrast media per Radiology protocol    Answer:   Yes    Order Specific Question:   Reason for Exam (SYMPTOM  OR DIAGNOSIS REQUIRED)    Answer:   Met Breast cancer restaging    Order Specific Question:   Is patient pregnant?    Answer:   No    Order Specific Question:   Preferred imaging location?    Answer:   [facility]   The patient has a good understanding of the overall plan. she agrees with it. she will call with any problems that may develop before the next visit here.   Rulon Eisenmenger, MD 06/26/16

## 2016-07-10 ENCOUNTER — Ambulatory Visit: Payer: Medicaid Other

## 2016-07-10 ENCOUNTER — Other Ambulatory Visit: Payer: Medicaid Other

## 2016-07-13 HISTORY — PX: BREAST BIOPSY: SHX20

## 2016-07-16 ENCOUNTER — Ambulatory Visit (HOSPITAL_COMMUNITY)
Admission: RE | Admit: 2016-07-16 | Discharge: 2016-07-16 | Disposition: A | Discharge status: Home / Self Care | Payer: Medicaid Other | Source: Ambulatory Visit | Attending: Hematology and Oncology | Admitting: Hematology and Oncology

## 2016-07-16 ENCOUNTER — Other Ambulatory Visit (HOSPITAL_BASED_OUTPATIENT_CLINIC_OR_DEPARTMENT_OTHER): Payer: Medicaid Other

## 2016-07-16 ENCOUNTER — Encounter (HOSPITAL_COMMUNITY): Payer: Self-pay

## 2016-07-16 DIAGNOSIS — C50412 Malignant neoplasm of upper-outer quadrant of left female breast: Secondary | ICD-10-CM | POA: Diagnosis not present

## 2016-07-16 DIAGNOSIS — Z171 Estrogen receptor negative status [ER-]: Secondary | ICD-10-CM | POA: Insufficient documentation

## 2016-07-16 DIAGNOSIS — C773 Secondary and unspecified malignant neoplasm of axilla and upper limb lymph nodes: Secondary | ICD-10-CM

## 2016-07-16 DIAGNOSIS — R932 Abnormal findings on diagnostic imaging of liver and biliary tract: Secondary | ICD-10-CM | POA: Diagnosis not present

## 2016-07-16 LAB — COMPREHENSIVE METABOLIC PANEL
ALT: 24 U/L (ref 0–55)
ANION GAP: 10 meq/L (ref 3–11)
AST: 28 U/L (ref 5–34)
Albumin: 4.4 g/dL (ref 3.5–5.0)
Alkaline Phosphatase: 87 U/L (ref 40–150)
BUN: 9.4 mg/dL (ref 7.0–26.0)
CHLORIDE: 103 meq/L (ref 98–109)
CO2: 25 meq/L (ref 22–29)
CREATININE: 0.9 mg/dL (ref 0.6–1.1)
Calcium: 9.7 mg/dL (ref 8.4–10.4)
EGFR: >90 mL/min/{1.73_m2} (ref >90)
GLUCOSE: 83 mg/dL (ref 70–140)
Potassium: 4.2 mEq/L (ref 3.5–5.1)
SODIUM: 137 meq/L (ref 136–145)
TOTAL PROTEIN: 7.8 g/dL (ref 6.4–8.3)
Total Bilirubin: 1.02 mg/dL (ref 0.20–1.20)

## 2016-07-16 LAB — CBC WITH DIFFERENTIAL/PLATELET
BASO%: 0.4 % (ref 0.0–2.0)
Basophils Absolute: 0 10*3/uL (ref 0.0–0.1)
EOS%: 0.7 % (ref 0.0–7.0)
Eosinophils Absolute: 0 10*3/uL (ref 0.0–0.5)
HCT: 39 % (ref 34.8–46.6)
HGB: 12.9 g/dL (ref 11.6–15.9)
LYMPH%: 64.1 % — AB (ref 14.0–49.7)
MCH: 31.2 pg (ref 25.1–34.0)
MCHC: 33.1 g/dL (ref 31.5–36.0)
MCV: 94.4 fL (ref 79.5–101.0)
MONO#: 0.2 10*3/uL (ref 0.1–0.9)
MONO%: 7.3 % (ref 0.0–14.0)
NEUT%: 27.5 % — AB (ref 38.4–76.8)
NEUTROS ABS: 0.8 10*3/uL — AB (ref 1.5–6.5)
PLATELETS: 178 10*3/uL (ref 145–400)
RBC: 4.13 10*6/uL (ref 3.70–5.45)
RDW: 12.9 % (ref 11.2–14.5)
WBC: 2.7 10*3/uL — AB (ref 3.9–10.3)
lymph#: 1.8 10*3/uL (ref 0.9–3.3)

## 2016-07-16 MED ORDER — IOPAMIDOL (ISOVUE-300) INJECTION 61%
INTRAVENOUS | Status: AC
  Administered 2016-07-16: 100 mL
  Filled 2016-07-16: qty 100

## 2016-07-16 NOTE — Assessment & Plan Note (Signed)
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  1. Normocytic anemia: Probably related to chemotherapy as well as anemia of chronic disease. We will have to monitor closely.  2. Neutropenia: We changed her chemotherapy regimen to gemcitabine every 2 weeks starting 12/27/2015 stopped November 2017.   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 No more clinical ascites  Elevated liver function tests: Related to extensive liver metastases. AST levels have come down. Alkaline phosphatase and total bilirubin have normalized  Severe hypoalbuminemia: No further nutritional issues.   CT chest abdomen pelvis 07/16/2016: Further regression of heterogeneous right liver metastases decreased from 6.2 x 4.8 cm to 5.6 x 3.3 cm no new or progressive metastatic disease in chest abdomen and pelvis  Plan: Patient wishes to go on watchful waiting program. We will be monitoring her once every 6 months with scans or sooner if symptoms arise.

## 2016-07-17 ENCOUNTER — Ambulatory Visit (HOSPITAL_BASED_OUTPATIENT_CLINIC_OR_DEPARTMENT_OTHER): Payer: Medicaid Other

## 2016-07-17 ENCOUNTER — Ambulatory Visit (HOSPITAL_BASED_OUTPATIENT_CLINIC_OR_DEPARTMENT_OTHER): Payer: Medicaid Other | Admitting: Hematology and Oncology

## 2016-07-17 ENCOUNTER — Encounter: Payer: Self-pay | Admitting: Hematology and Oncology

## 2016-07-17 DIAGNOSIS — D701 Agranulocytosis secondary to cancer chemotherapy: Secondary | ICD-10-CM

## 2016-07-17 DIAGNOSIS — C787 Secondary malignant neoplasm of liver and intrahepatic bile duct: Secondary | ICD-10-CM

## 2016-07-17 DIAGNOSIS — C50412 Malignant neoplasm of upper-outer quadrant of left female breast: Secondary | ICD-10-CM | POA: Diagnosis not present

## 2016-07-17 DIAGNOSIS — Z95828 Presence of other vascular implants and grafts: Secondary | ICD-10-CM

## 2016-07-17 DIAGNOSIS — Z171 Estrogen receptor negative status [ER-]: Secondary | ICD-10-CM

## 2016-07-17 DIAGNOSIS — Z452 Encounter for adjustment and management of vascular access device: Secondary | ICD-10-CM | POA: Diagnosis present

## 2016-07-17 MED ORDER — SODIUM CHLORIDE 0.9 % IJ SOLN
10.0000 mL | INTRAMUSCULAR | Status: DC | PRN
  Administered 2016-07-17: 10 mL via INTRAVENOUS
  Filled 2016-07-17: qty 10

## 2016-07-17 MED ORDER — HEPARIN SOD (PORK) LOCK FLUSH 100 UNIT/ML IV SOLN
500.0000 [IU] | Freq: Once | INTRAVENOUS | Status: AC | PRN
  Administered 2016-07-17: 500 [IU] via INTRAVENOUS
  Filled 2016-07-17: qty 5

## 2016-07-17 MED ORDER — PEGFILGRASTIM INJECTION 6 MG/0.6ML ~~LOC~~
6.0000 mg | PREFILLED_SYRINGE | Freq: Once | SUBCUTANEOUS | Status: AC
  Administered 2016-07-17: 6 mg via SUBCUTANEOUS
  Filled 2016-07-17: qty 0.6

## 2016-07-17 NOTE — Patient Instructions (Signed)
Pegfilgrastim injection What is this medicine? PEGFILGRASTIM (PEG fil gra stim) is a long-acting granulocyte colony-stimulating factor that stimulates the growth of neutrophils, a type of white blood cell important in the body's fight against infection. It is used to reduce the incidence of fever and infection in patients with certain types of cancer who are receiving chemotherapy that affects the bone marrow, and to increase survival after being exposed to high doses of radiation. This medicine may be used for other purposes; ask your health care provider or pharmacist if you have questions. COMMON BRAND NAME(S): Neulasta What should I tell my health care provider before I take this medicine? They need to know if you have any of these conditions: -kidney disease -latex allergy -ongoing radiation therapy -sickle cell disease -skin reactions to acrylic adhesives (On-Body Injector only) -an unusual or allergic reaction to pegfilgrastim, filgrastim, other medicines, foods, dyes, or preservatives -pregnant or trying to get pregnant -breast-feeding How should I use this medicine? This medicine is for injection under the skin. If you get this medicine at home, you will be taught how to prepare and give the pre-filled syringe or how to use the On-body Injector. Refer to the patient Instructions for Use for detailed instructions. Use exactly as directed. Take your medicine at regular intervals. Do not take your medicine more often than directed. It is important that you put your used needles and syringes in a special sharps container. Do not put them in a trash can. If you do not have a sharps container, call your pharmacist or healthcare provider to get one. Talk to your pediatrician regarding the use of this medicine in children. While this drug may be prescribed for selected conditions, precautions do apply. Overdosage: If you think you have taken too much of this medicine contact a poison control  center or emergency room at once. NOTE: This medicine is only for you. Do not share this medicine with others. What if I miss a dose? It is important not to miss your dose. Call your doctor or health care professional if you miss your dose. If you miss a dose due to an On-body Injector failure or leakage, a new dose should be administered as soon as possible using a single prefilled syringe for manual use. What may interact with this medicine? Interactions have not been studied. Give your health care provider a list of all the medicines, herbs, non-prescription drugs, or dietary supplements you use. Also tell them if you smoke, drink alcohol, or use illegal drugs. Some items may interact with your medicine. This list may not describe all possible interactions. Give your health care provider a list of all the medicines, herbs, non-prescription drugs, or dietary supplements you use. Also tell them if you smoke, drink alcohol, or use illegal drugs. Some items may interact with your medicine. What should I watch for while using this medicine? You may need blood work done while you are taking this medicine. If you are going to need a MRI, CT scan, or other procedure, tell your doctor that you are using this medicine (On-Body Injector only). What side effects may I notice from receiving this medicine? Side effects that you should report to your doctor or health care professional as soon as possible: -allergic reactions like skin rash, itching or hives, swelling of the face, lips, or tongue -dizziness -fever -pain, redness, or irritation at site where injected -pinpoint red spots on the skin -red or dark-brown urine -shortness of breath or breathing problems -stomach or   side pain, or pain at the shoulder -swelling -tiredness -trouble passing urine or change in the amount of urine Side effects that usually do not require medical attention (report to your doctor or health care professional if they  continue or are bothersome): -bone pain -muscle pain This list may not describe all possible side effects. Call your doctor for medical advice about side effects. You may report side effects to FDA at 1-800-FDA-1088. Where should I keep my medicine? Keep out of the reach of children. Store pre-filled syringes in a refrigerator between 2 and 8 degrees C (36 and 46 degrees F). Do not freeze. Keep in carton to protect from light. Throw away this medicine if it is left out of the refrigerator for more than 48 hours. Throw away any unused medicine after the expiration date. NOTE: This sheet is a summary. It may not cover all possible information. If you have questions about this medicine, talk to your doctor, pharmacist, or health care provider.  2017 [employer]/Gold Standard (2014-07-19 14:30:14)  

## 2016-07-17 NOTE — Progress Notes (Signed)
Patient Care Team: Pcp Not In System as PCP - General Dawn Skates, MD as Consulting Physician (General Surgery) Nicholas Lose, MD as Consulting Physician (Hematology and Oncology) Kyung Rudd, MD as Consulting Physician (Radiation Oncology)  DIAGNOSIS:  Encounter Diagnosis  Name Primary?  . Malignant neoplasm of upper-outer quadrant of left breast in female, estrogen receptor negative ([place])     SUMMARY OF ONCOLOGIC HISTORY:   Breast cancer of upper-outer quadrant of left female breast ([place])   07/02/2015 Mammogram    Left breast mass 5.3 x 5.2 x 2.7 cm at 1:30 position, multiple abnormal enlarged left axillary lymph nodes      07/09/2015 Initial Diagnosis    Left breast biopsy 1:30: IDC grade 3, ER PR 0%, HER-2 negative ratio 1.65, Ki-67 90%; left axillary lymph node biopsy: IDC grade 3 completely replacing the lymph node ER PR 0%, HER-2 negative ratio 1.22 Ki-67 90%      07/25/2015 -  Chemotherapy    Palliative chemotherapy: Carboplatin and gemcitabine day 1 and day 8 every 3 weeks ( metastatic breast cancer with extensive liver metastases and malignant recurrent ascites), switched to gemcitabine every 2 weeks      12/02/2015 Imaging    CT CAP: Decrease in the liver metastases now measuring 7 x 5 cm was previously 8.4 x 9.3 cm.      03/05/2016 Imaging    CT CAP: Interval decrease in the heterogeneous posterior right lower lobe mass from 6.5 x 6.1 cm to 6.2 x 4.8 cm       CHIEF COMPLIANT: Follow-up to discuss CT scans  INTERVAL HISTORY: Dawn Haynes is a 36 year old with above-mentioned history of metastatic [place] cancer with liver metastases. She also had malignant ascites. After receiving chemotherapy from January 2017 to November 2017, she had dramatic response to treatment. She no longer has malignant ascites or symptoms of peritoneal carcinomatosis. Her liver metastases have also markedly shrunken size. She has been neutropenic since November related to prior chemotherapy.  We have not performed any bone marrow evaluations. She is otherwise asymptomatic and feels really well.  REVIEW OF SYSTEMS:   Constitutional: Denies fevers, chills or abnormal weight loss Eyes: Denies blurriness of vision Ears, nose, mouth, throat, and face: Denies mucositis or sore throat Respiratory: Denies cough, dyspnea or wheezes Cardiovascular: Denies palpitation, chest discomfort Gastrointestinal:  Denies nausea, heartburn or change in bowel habits Skin: Denies abnormal skin rashes Lymphatics: Denies new lymphadenopathy or easy bruising Neurological:Denies numbness, tingling or new weaknesses Behavioral/Psych: Mood is stable, no new changes  Extremities: No lower extremity edema Breast:  denies any pain or lumps or nodules in either breasts All other systems were reviewed with the patient and are negative.  I have reviewed the past medical history, past surgical history, social history and family history with the patient and they are unchanged from previous note.  ALLERGIES:  has No Known Allergies.  MEDICATIONS:  Current Outpatient Prescriptions  Medication Sig Dispense Refill  . BORON PO Take 1 tablet by mouth 3 (three) times daily as needed (gas/flatulence).    . Cholecalciferol (VITAMIN D) 2000 units CAPS Take 2,000 Units by mouth daily.     . ondansetron (ZOFRAN) 4 MG tablet Take 1 tablet (4 mg total) by mouth every 8 (eight) hours as needed for nausea or vomiting. 30 tablet 0  . oxyCODONE (OXY IR/ROXICODONE) 5 MG immediate release tablet Take 1 tablet (5 mg total) by mouth every 4 (four) hours as needed for moderate pain. 60 tablet 0  .  polyethylene glycol (MIRALAX / GLYCOLAX) packet Take 17 g by mouth daily. May increase to 34 g daily if needed 30 each 0  . Probiotic Product (SOLUBLE FIBER/PROBIOTICS PO) Take 1 tablet by mouth daily.     Marland Kitchen senna-docusate (SENOKOT-S) 8.6-50 MG tablet Take 2 tablets by mouth daily. 20 tablet 0  . vitamin B-12 (CYANOCOBALAMIN) 100 MCG tablet  Take 100 mcg by mouth daily.     No current facility-administered medications for this visit.     PHYSICAL EXAMINATION: ECOG PERFORMANCE STATUS: 1 - Symptomatic but completely ambulatory  Vitals:   07/17/16 0853  BP: 127/79  Pulse: 87  Resp: 18  Temp: 99 F (37.2 C)   Filed Weights   07/17/16 0853  Weight: 136 lb 6.4 oz (61.9 kg)    GENERAL:alert, no distress and comfortable SKIN: skin color, texture, turgor are normal, no rashes or significant lesions EYES: normal, Conjunctiva are pink and non-injected, sclera clear OROPHARYNX:no exudate, no erythema and lips, buccal mucosa, and tongue normal  NECK: supple, thyroid normal size, non-tender, without nodularity LYMPH:  no palpable lymphadenopathy in the cervical, axillary or inguinal LUNGS: clear to auscultation and percussion with normal breathing effort HEART: regular rate & rhythm and no murmurs and no lower extremity edema ABDOMEN:abdomen soft, non-tender and normal bowel sounds MUSCULOSKELETAL:no cyanosis of digits and no clubbing  NEURO: alert & oriented x 3 with fluent speech, no focal motor/sensory deficits EXTREMITIES: No lower extremity edema   LABORATORY DATA:  I have reviewed the data as listed   Chemistry      Component Value Date/Time   NA 137 07/16/2016 0934   K 4.2 07/16/2016 0934   CL 97 (L) 08/02/2015 2242   CO2 25 07/16/2016 0934   BUN 9.4 07/16/2016 0934   CREATININE 0.9 07/16/2016 0934      Component Value Date/Time   CALCIUM 9.7 07/16/2016 0934   ALKPHOS 87 07/16/2016 0934   AST 28 07/16/2016 0934   ALT 24 07/16/2016 0934   BILITOT 1.02 07/16/2016 0934       Lab Results  Component Value Date   WBC 2.7 (L) 07/16/2016   HGB 12.9 07/16/2016   HCT 39.0 07/16/2016   MCV 94.4 07/16/2016   PLT 178 07/16/2016   NEUTROABS 0.8 (L) 07/16/2016    ASSESSMENT & PLAN:  Breast cancer of upper-outer quadrant of left female breast ([place]) Left breast biopsy 1:30 on 07/09/2015: IDC grade 3, ER PR  0%, HER-2 negative ratio 1.65, Ki-67 90%;  left axillary lymph node biopsy: IDC grade 3 completely replacing the lymph node ER PR 0%, HER-2 negative ratio 1.22 Ki-67 90%  Left breast mass 5.3 x 5.2 x 2.7 cm at 1:30 position, multiple abnormal enlarged left axillary lymph nodes  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  1. Normocytic anemia: Resolved  2. Neutropenia: Neulasta to be given today.   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 No more clinical ascites  Elevated liver function tests: Related to extensive liver metastases. Resolved Severe hypoalbuminemia: No further nutritional issues.   CT chest abdomen pelvis 07/16/2016: Further regression of heterogeneous right liver metastases decreased from  6.2 x 4.8 cm to 5.6 x 3.3 cm no new or progressive metastatic disease in chest abdomen and pelvis  Plan:  1. Will refer the patient to Dr. Barry Dienes for liver resection evaluation 2. Patient wishes to go on watchful waiting program without chemotherapy. Return to clinic in 2 months with labs and follow-up. She will need port flushes   Orders Placed This Encounter  Procedures  . CBC with Differential    Standing Status:   Future    Standing Expiration Date:   07/17/2017  . Comprehensive metabolic panel    Standing Status:   Future    Standing Expiration  Date:   07/17/2017  . Ambulatory referral to General Surgery    Referral Priority:   Routine    Referral Type:   Surgical    Referral Reason:   Specialty Services Required    Requested Specialty:   General Surgery    Number of Visits Requested:   1   The patient has a good understanding of the overall plan. she agrees with it. she will call with any problems that may develop before the next visit here.   Rulon Eisenmenger, MD 07/17/16

## 2016-07-24 ENCOUNTER — Ambulatory Visit: Payer: Medicaid Other

## 2016-07-24 ENCOUNTER — Other Ambulatory Visit: Payer: Medicaid Other

## 2016-08-04 ENCOUNTER — Other Ambulatory Visit: Payer: Self-pay | Admitting: General Surgery

## 2016-08-04 DIAGNOSIS — C50912 Malignant neoplasm of unspecified site of left female breast: Secondary | ICD-10-CM

## 2016-08-05 ENCOUNTER — Ambulatory Visit
Admission: RE | Admit: 2016-08-05 | Discharge: 2016-08-05 | Disposition: A | Discharge status: Home / Self Care | Payer: Medicaid Other | Source: Ambulatory Visit | Attending: General Surgery | Admitting: General Surgery

## 2016-08-05 DIAGNOSIS — C50912 Malignant neoplasm of unspecified site of left female breast: Secondary | ICD-10-CM

## 2016-08-05 MED ORDER — GADOBENATE DIMEGLUMINE 529 MG/ML IV SOLN
12.0000 mL | Freq: Once | INTRAVENOUS | Status: AC | PRN
  Administered 2016-08-05: 12 mL via INTRAVENOUS

## 2016-08-06 ENCOUNTER — Other Ambulatory Visit: Payer: Self-pay | Admitting: Hematology and Oncology

## 2016-08-06 DIAGNOSIS — Z171 Estrogen receptor negative status [ER-]: Principal | ICD-10-CM

## 2016-08-06 DIAGNOSIS — C787 Secondary malignant neoplasm of liver and intrahepatic bile duct: Secondary | ICD-10-CM

## 2016-08-06 DIAGNOSIS — C50412 Malignant neoplasm of upper-outer quadrant of left female breast: Secondary | ICD-10-CM

## 2016-08-06 NOTE — Progress Notes (Signed)
I called the patient informed the MRI findings. Dr. Barry Dienes is requesting a PET scan to evaluate if the lesion is scar tissue or active cancer. Patient is willing to go through the PET scan. I sent an order for that.

## 2016-08-18 ENCOUNTER — Encounter (HOSPITAL_COMMUNITY): Payer: Medicaid Other

## 2016-08-20 ENCOUNTER — Encounter (HOSPITAL_COMMUNITY)
Admission: RE | Admit: 2016-08-20 | Discharge: 2016-08-20 | Disposition: A | Discharge status: Home / Self Care | Payer: Medicaid Other | Source: Ambulatory Visit | Attending: Hematology and Oncology | Admitting: Hematology and Oncology

## 2016-08-20 DIAGNOSIS — C50412 Malignant neoplasm of upper-outer quadrant of left female breast: Secondary | ICD-10-CM | POA: Insufficient documentation

## 2016-08-20 DIAGNOSIS — C787 Secondary malignant neoplasm of liver and intrahepatic bile duct: Secondary | ICD-10-CM | POA: Insufficient documentation

## 2016-08-20 DIAGNOSIS — Z171 Estrogen receptor negative status [ER-]: Secondary | ICD-10-CM | POA: Insufficient documentation

## 2016-08-20 LAB — GLUCOSE, CAPILLARY: Glucose-Capillary: 86 mg/dL (ref 65–99)

## 2016-08-20 MED ORDER — FLUDEOXYGLUCOSE F - 18 (FDG) INJECTION
6.7000 | Freq: Once | INTRAVENOUS | Status: AC | PRN
  Administered 2016-08-20: 6.7 via INTRAVENOUS

## 2016-09-11 ENCOUNTER — Encounter: Payer: Self-pay | Admitting: Hematology and Oncology

## 2016-09-11 ENCOUNTER — Ambulatory Visit (HOSPITAL_BASED_OUTPATIENT_CLINIC_OR_DEPARTMENT_OTHER): Payer: Medicaid Other | Admitting: Hematology and Oncology

## 2016-09-11 ENCOUNTER — Ambulatory Visit (HOSPITAL_BASED_OUTPATIENT_CLINIC_OR_DEPARTMENT_OTHER): Payer: Medicaid Other

## 2016-09-11 ENCOUNTER — Other Ambulatory Visit (HOSPITAL_BASED_OUTPATIENT_CLINIC_OR_DEPARTMENT_OTHER): Payer: Medicaid Other

## 2016-09-11 DIAGNOSIS — C50412 Malignant neoplasm of upper-outer quadrant of left female breast: Secondary | ICD-10-CM

## 2016-09-11 DIAGNOSIS — C787 Secondary malignant neoplasm of liver and intrahepatic bile duct: Secondary | ICD-10-CM | POA: Diagnosis not present

## 2016-09-11 DIAGNOSIS — Z452 Encounter for adjustment and management of vascular access device: Secondary | ICD-10-CM | POA: Diagnosis not present

## 2016-09-11 DIAGNOSIS — R180 Malignant ascites: Secondary | ICD-10-CM

## 2016-09-11 DIAGNOSIS — R770 Abnormality of albumin: Secondary | ICD-10-CM | POA: Diagnosis not present

## 2016-09-11 DIAGNOSIS — D701 Agranulocytosis secondary to cancer chemotherapy: Secondary | ICD-10-CM

## 2016-09-11 DIAGNOSIS — C773 Secondary and unspecified malignant neoplasm of axilla and upper limb lymph nodes: Secondary | ICD-10-CM

## 2016-09-11 DIAGNOSIS — Z171 Estrogen receptor negative status [ER-]: Secondary | ICD-10-CM | POA: Diagnosis not present

## 2016-09-11 DIAGNOSIS — Z95828 Presence of other vascular implants and grafts: Secondary | ICD-10-CM

## 2016-09-11 LAB — CBC WITH DIFFERENTIAL/PLATELET
BASO%: 0.3 % (ref 0.0–2.0)
Basophils Absolute: 0 10*3/uL (ref 0.0–0.1)
EOS ABS: 0 10*3/uL (ref 0.0–0.5)
EOS%: 0.6 % (ref 0.0–7.0)
HEMATOCRIT: 36.5 % (ref 34.8–46.6)
HEMOGLOBIN: 12 g/dL (ref 11.6–15.9)
LYMPH%: 66 % — ABNORMAL HIGH (ref 14.0–49.7)
MCH: 30.3 pg (ref 25.1–34.0)
MCHC: 32.9 g/dL (ref 31.5–36.0)
MCV: 92.2 fL (ref 79.5–101.0)
MONO#: 0.2 10*3/uL (ref 0.1–0.9)
MONO%: 6.7 % (ref 0.0–14.0)
NEUT%: 26.4 % — ABNORMAL LOW (ref 38.4–76.8)
NEUTROS ABS: 0.8 10*3/uL — AB (ref 1.5–6.5)
PLATELETS: 165 10*3/uL (ref 145–400)
RBC: 3.96 10*6/uL (ref 3.70–5.45)
RDW: 12.6 % (ref 11.2–14.5)
WBC: 3.2 10*3/uL — AB (ref 3.9–10.3)
lymph#: 2.1 10*3/uL (ref 0.9–3.3)

## 2016-09-11 LAB — COMPREHENSIVE METABOLIC PANEL
ALBUMIN: 4.1 g/dL (ref 3.5–5.0)
ALK PHOS: 67 U/L (ref 40–150)
ALT: 24 U/L (ref 0–55)
ANION GAP: 10 meq/L (ref 3–11)
AST: 26 U/L (ref 5–34)
BILIRUBIN TOTAL: 0.68 mg/dL (ref 0.20–1.20)
BUN: 12.1 mg/dL (ref 7.0–26.0)
CALCIUM: 9.6 mg/dL (ref 8.4–10.4)
CO2: 24 meq/L (ref 22–29)
CREATININE: 0.8 mg/dL (ref 0.6–1.1)
Chloride: 107 mEq/L (ref 98–109)
EGFR: >90 mL/min/{1.73_m2} (ref >90)
Glucose: 99 mg/dl (ref 70–140)
Potassium: 3.7 mEq/L (ref 3.5–5.1)
Sodium: 141 mEq/L (ref 136–145)
TOTAL PROTEIN: 7.4 g/dL (ref 6.4–8.3)

## 2016-09-11 MED ORDER — HEPARIN SOD (PORK) LOCK FLUSH 100 UNIT/ML IV SOLN
500.0000 [IU] | Freq: Once | INTRAVENOUS | Status: AC
  Administered 2016-09-11: 500 [IU] via INTRAVENOUS
  Filled 2016-09-11: qty 5

## 2016-09-11 MED ORDER — SODIUM CHLORIDE 0.9% FLUSH
10.0000 mL | INTRAVENOUS | Status: DC | PRN
  Administered 2016-09-11: 10 mL via INTRAVENOUS
  Filled 2016-09-11: qty 10

## 2016-09-11 NOTE — Assessment & Plan Note (Signed)
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  1. Normocytic anemia: Resolved  2. Neutropenia: Neulasta to be given today.   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 No more clinical ascites Elevated liver function tests: Related to extensive liver metastases. Resolved Severe hypoalbuminemia: No further nutritional issues.   MRI abdomen 08/05/2016: Irregular scarred area posterior aspect of the right hepatic lobe, some peripheral nodular enhancement could be residual tumor or granulation tissue but mostly scar tissue. PET/CT scan 08/20/2016: No active malignancy identified  Plan: Continued surveillance with a follow-up CT scan in 4 months  

## 2016-09-11 NOTE — Progress Notes (Signed)
Patient Care Team: Pcp Not In System as PCP - General [name], MD as Consulting Physician (General Surgery) Nicholas Lose, MD as Consulting Physician (Hematology and Oncology) Kyung Rudd, MD as Consulting Physician (Radiation Oncology)  DIAGNOSIS:  Encounter Diagnosis  Name Primary?  . Malignant neoplasm of upper-outer quadrant of left breast in female, estrogen receptor negative ([place])     SUMMARY OF ONCOLOGIC HISTORY:   Breast cancer of upper-outer quadrant of left female breast ([place])   07/02/2015 Mammogram    Left breast mass 5.3 x 5.2 x 2.7 cm at 1:30 position, multiple abnormal enlarged left axillary lymph nodes      07/09/2015 Initial Diagnosis    Left breast biopsy 1:30: IDC grade 3, ER PR 0%, HER-2 negative ratio 1.65, Ki-67 90%; left axillary lymph node biopsy: IDC grade 3 completely replacing the lymph node ER PR 0%, HER-2 negative ratio 1.22 Ki-67 90%      07/25/2015 -  Chemotherapy    Palliative chemotherapy: Carboplatin and gemcitabine day 1 and day 8 every 3 weeks ( metastatic breast cancer with extensive liver metastases and malignant recurrent ascites), switched to gemcitabine every 2 weeks      12/02/2015 Imaging    CT CAP: Decrease in the liver metastases now measuring 7 x 5 cm was previously 8.4 x 9.3 cm.      03/05/2016 Imaging    CT CAP: Interval decrease in the heterogeneous posterior right lower lobe mass from 6.5 x 6.1 cm to 6.2 x 4.8 cm       CHIEF COMPLIANT: Follow-up after recent PET CT scan and liver MRI  INTERVAL HISTORY: Dawn Haynes is a 36 year old with metastatic triple negative breast cancer who had remarkable response to chemotherapy and is doing quite well. She had a liver lesion which we were evaluating for liver resection. She underwent a liver MRI which did not show significant amount of active disease. Performed a PET/CT scan that consistently does not appear to show any active disease in the liver or anywhere else. Because of these  findings she was felt not to require a liver resection at this time. She is here to discuss the results of the scans. She denies any abdominal swelling or pain. 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  REVIEW OF SYSTEMS:   Constitutional: Denies fevers, chills or abnormal weight loss Eyes: Denies blurriness of vision Ears, nose, mouth, throat, and face: Denies mucositis or sore throat Respiratory: Denies cough, dyspnea or wheezes Cardiovascular: Denies palpitation, chest discomfort Gastrointestinal:  Denies nausea, heartburn or change in bowel habits Skin: Denies abnormal skin rashes Lymphatics: Denies new lymphadenopathy or easy bruising Neurological:Denies numbness, tingling or new weaknesses Behavioral/Psych: Mood is stable, no new changes  Extremities: No lower extremity edema Breast:  denies any pain or lumps or nodules in either breasts All other systems were reviewed with the patient and are negative.  I have reviewed the past medical history, past surgical history, social history and family history with the patient and they are unchanged from previous note.  ALLERGIES:  has No Known Allergies.  MEDICATIONS:  Current Outpatient Prescriptions  Medication Sig Dispense Refill  . BORON PO Take 1 tablet by mouth 3 (three) times daily as needed (gas/flatulence).    . Cholecalciferol (VITAMIN D) 2000 units CAPS Take 2,000 Units by mouth daily.     . ondansetron (ZOFRAN) 4 MG tablet Take 1 tablet (4 mg total) by mouth every 8 (eight) hours as needed for nausea or vomiting. 30 tablet 0  .  oxyCODONE (OXY IR/ROXICODONE) 5 MG immediate release tablet Take 1 tablet (5 mg total) by mouth every 4 (four) hours as needed for moderate pain. 60 tablet 0  . polyethylene glycol (MIRALAX / GLYCOLAX) packet Take 17 g by mouth daily. May increase to 34 g daily if needed 30 each 0  . Probiotic Product (SOLUBLE FIBER/PROBIOTICS PO) Take 1 tablet by mouth daily.     Marland Kitchen senna-docusate (SENOKOT-S)  8.6-50 MG tablet Take 2 tablets by mouth daily. 20 tablet 0  . vitamin B-12 (CYANOCOBALAMIN) 100 MCG tablet Take 100 mcg by mouth daily.     No current facility-administered medications for this visit.     PHYSICAL EXAMINATION: ECOG PERFORMANCE STATUS: 1 - Symptomatic but completely ambulatory  Vitals:   09/11/16 0828  BP: 124/76  Pulse: 74  Resp: 18  Temp: 98 F (36.7 C)   Filed Weights   09/11/16 0828  Weight: 137 lb 14.4 oz (62.6 kg)    GENERAL:alert, no distress and comfortable SKIN: skin color, texture, turgor are normal, no rashes or significant lesions EYES: normal, Conjunctiva are pink and non-injected, sclera clear OROPHARYNX:no exudate, no erythema and lips, buccal mucosa, and tongue normal  NECK: supple, thyroid normal size, non-tender, without nodularity LYMPH:  no palpable lymphadenopathy in the cervical, axillary or inguinal LUNGS: clear to auscultation and percussion with normal breathing effort HEART: regular rate & rhythm and no murmurs and no lower extremity edema ABDOMEN:abdomen soft, non-tender and normal bowel sounds MUSCULOSKELETAL:no cyanosis of digits and no clubbing  NEURO: alert & oriented x 3 with fluent speech, no focal motor/sensory deficits EXTREMITIES: No lower extremity edema  LABORATORY DATA:  I have reviewed the data as listed   Chemistry      Component Value Date/Time   NA 137 07/16/2016 0934   K 4.2 07/16/2016 0934   CL 97 (L) 08/02/2015 2242   CO2 25 07/16/2016 0934   BUN 9.4 07/16/2016 0934   CREATININE 0.9 07/16/2016 0934      Component Value Date/Time   CALCIUM 9.7 07/16/2016 0934   ALKPHOS 87 07/16/2016 0934   AST 28 07/16/2016 0934   ALT 24 07/16/2016 0934   BILITOT 1.02 07/16/2016 0934       Lab Results  Component Value Date   WBC 3.2 (L) 09/11/2016   HGB 12.0 09/11/2016   HCT 36.5 09/11/2016   MCV 92.2 09/11/2016   PLT 165 09/11/2016   NEUTROABS 0.8 (L) 09/11/2016    ASSESSMENT & PLAN:  Breast cancer of  upper-outer quadrant of left female breast ([place]) 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, Ki-67 90%;  left axillary lymph node biopsy: IDC grade 3 completely replacing the lymph node ER PR 0%, HER-2 negative ratio 1.22 Ki-67 90%  Left breast mass 5.3 x 5.2 x 2.7 cm at 1:30 position, multiple abnormal enlarged left axillary lymph nodes  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  1. Normocytic anemia: Resolved  2. Neutropenia: Neulasta to be given today.   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 No more clinical ascites Elevated liver function tests:  Related to extensive liver metastases. Resolved Severe hypoalbuminemia: No further nutritional issues.   MRI abdomen 08/05/2016: Irregular scarred area posterior aspect of the right hepatic lobe, some peripheral nodular enhancement could be residual tumor or granulation tissue but mostly scar tissue. PET/CT scan 08/20/2016: No active malignancy identified, hypermetabolic activity in the left breast.  Plan: Continued surveillance with a follow-up CT scan in 4 months We will plan to obtain a mammogram/ultrasound/biopsy of the left breast lesion.  I spent 25 minutes talking to the patient of which more than half was spent in counseling and coordination of  care.  Orders Placed This Encounter  Procedures  . MM DIAG BREAST TOMO BILATERAL    Please do Ultrasound and biopsy if necessary. Please see recent PET scan    Standing Status:   Future    Standing Expiration Date:   11/11/2017    Order Specific Question:   Reason for Exam (SYMPTOM  OR DIAGNOSIS REQUIRED)    Answer:   Left breast cancer reevaluation    Order Specific Question:   Is the patient pregnant?    Answer:   No    Order Specific Question:   Preferred imaging location?    Answer:   [facility]   The patient has a good understanding of the overall plan. she agrees with it. she will call with any problems that may develop before the next visit here.   Rulon Eisenmenger, MD 09/11/16

## 2016-09-15 ENCOUNTER — Other Ambulatory Visit: Payer: Self-pay | Admitting: Hematology and Oncology

## 2016-09-15 ENCOUNTER — Ambulatory Visit
Admission: RE | Admit: 2016-09-15 | Discharge: 2016-09-15 | Disposition: A | Discharge status: Home / Self Care | Payer: Medicaid Other | Source: Ambulatory Visit | Attending: Hematology and Oncology | Admitting: Hematology and Oncology

## 2016-09-15 DIAGNOSIS — Z171 Estrogen receptor negative status [ER-]: Principal | ICD-10-CM

## 2016-09-15 DIAGNOSIS — C50412 Malignant neoplasm of upper-outer quadrant of left female breast: Secondary | ICD-10-CM

## 2016-09-15 DIAGNOSIS — N6320 Unspecified lump in the left breast, unspecified quadrant: Secondary | ICD-10-CM

## 2016-09-16 ENCOUNTER — Ambulatory Visit
Admission: RE | Admit: 2016-09-16 | Discharge: 2016-09-16 | Disposition: A | Discharge status: Home / Self Care | Payer: Medicaid Other | Source: Ambulatory Visit | Attending: Hematology and Oncology | Admitting: Hematology and Oncology

## 2016-09-16 ENCOUNTER — Other Ambulatory Visit: Payer: Self-pay | Admitting: Hematology and Oncology

## 2016-09-16 DIAGNOSIS — N6320 Unspecified lump in the left breast, unspecified quadrant: Secondary | ICD-10-CM

## 2016-09-24 ENCOUNTER — Telehealth: Payer: Self-pay | Admitting: *Deleted

## 2016-09-24 ENCOUNTER — Telehealth: Payer: Self-pay | Admitting: Hematology and Oncology

## 2016-09-24 NOTE — Telephone Encounter (Signed)
Per email from navigator patient was to f/u with [facility] after mamo/bx. Per navigator procedures have been completed - schedule f/u for tomorrow. Spoke with patient re appointment for 3/16 @ 8:30 am.

## 2016-09-24 NOTE — Telephone Encounter (Signed)
Per Dr. [name] he will see after she sees Dr. Barry Dienes. Called pt to cancel appt and explain reasoning. Received verbal understanding. Appt cancelled

## 2016-09-25 ENCOUNTER — Ambulatory Visit: Payer: Medicaid Other | Admitting: Hematology and Oncology

## 2016-09-28 ENCOUNTER — Other Ambulatory Visit: Payer: Self-pay | Admitting: General Surgery

## 2016-09-28 DIAGNOSIS — C50412 Malignant neoplasm of upper-outer quadrant of left female breast: Secondary | ICD-10-CM

## 2016-09-28 DIAGNOSIS — Z171 Estrogen receptor negative status [ER-]: Principal | ICD-10-CM

## 2016-10-14 ENCOUNTER — Other Ambulatory Visit: Payer: Self-pay | Admitting: General Surgery

## 2016-10-14 DIAGNOSIS — Z171 Estrogen receptor negative status [ER-]: Principal | ICD-10-CM

## 2016-10-14 DIAGNOSIS — C50412 Malignant neoplasm of upper-outer quadrant of left female breast: Secondary | ICD-10-CM

## 2016-10-22 ENCOUNTER — Encounter (HOSPITAL_COMMUNITY)
Admission: RE | Admit: 2016-10-22 | Discharge: 2016-10-22 | Disposition: A | Discharge status: Home / Self Care | Payer: Medicaid Other | Source: Ambulatory Visit | Attending: General Surgery | Admitting: General Surgery

## 2016-10-22 ENCOUNTER — Encounter (HOSPITAL_COMMUNITY): Payer: Self-pay

## 2016-10-22 DIAGNOSIS — C50412 Malignant neoplasm of upper-outer quadrant of left female breast: Secondary | ICD-10-CM | POA: Insufficient documentation

## 2016-10-22 DIAGNOSIS — Z01818 Encounter for other preprocedural examination: Secondary | ICD-10-CM | POA: Diagnosis not present

## 2016-10-22 HISTORY — DX: Personal history of other medical treatment: Z92.89

## 2016-10-22 LAB — BASIC METABOLIC PANEL
Anion gap: 8 (ref 5–15)
BUN: 13 mg/dL (ref 6–20)
CHLORIDE: 103 mmol/L (ref 101–111)
CO2: 26 mmol/L (ref 22–32)
CREATININE: 0.96 mg/dL (ref 0.44–1.00)
Calcium: 9.5 mg/dL (ref 8.9–10.3)
GFR calc Af Amer: >60 mL/min (ref >60)
GFR calc non Af Amer: >60 mL/min (ref >60)
GLUCOSE: 75 mg/dL (ref 65–99)
Potassium: 3.9 mmol/L (ref 3.5–5.1)
Sodium: 137 mmol/L (ref 135–145)

## 2016-10-22 LAB — CBC
HEMATOCRIT: 35.8 % — AB (ref 36.0–46.0)
HEMOGLOBIN: 11.7 g/dL — AB (ref 12.0–15.0)
MCH: 30.2 pg (ref 26.0–34.0)
MCHC: 32.7 g/dL (ref 30.0–36.0)
MCV: 92.3 fL (ref 78.0–100.0)
Platelets: 193 10*3/uL (ref 150–400)
RBC: 3.88 MIL/uL (ref 3.87–5.11)
RDW: 13 % (ref 11.5–15.5)
WBC: 4.3 10*3/uL (ref 4.0–10.5)

## 2016-10-22 LAB — HCG, SERUM, QUALITATIVE: Preg, Serum: NEGATIVE

## 2016-10-22 MED ORDER — CHLORHEXIDINE GLUCONATE CLOTH 2 % EX PADS: 6.0000 | MEDICATED_PAD | Freq: Once | CUTANEOUS | Status: DC

## 2016-10-22 NOTE — Progress Notes (Signed)
Cardiologist denies   Medical Md denies having one  Denies ever having an echo/stress test/heart cath  EKG and CXR denies in past yr

## 2016-10-22 NOTE — Pre-Procedure Instructions (Signed)
Dawn Haynes  10/22/2016      [facility] [place], [place] [address] [place] [address] Phone: [phone] Fax: [phone]  [id number] - [place], [place] [address] Phone: [phone] Fax: [phone]  [facility] [place], [place] [address] Phone: [phone] Fax: [phone]    Your procedure is scheduled on Wed, April 18 @ 11:20 AM  Report to [place] at 9:20 AM  Call this number if you have problems the morning of surgery:  [phone]   Remember:  Do not eat food or drink liquids after midnight.  Take these medicines the morning of surgery with A SIP OF WATER Claritin(Loratadine)             No Goody's,BC's,Aleve,Advil,Motrin,Ibuprofen,Fish Oil,or any Herbal Medications.    Do not wear jewelry, make-up or nail polish.  Do not wear lotions, powders, perfumes, or deoderant.  Do not shave 48 hours prior to surgery.    Do not bring valuables to the hospital.  [facility] is not responsible for any belongings or valuables.  Contacts, dentures or bridgework may not be worn into surgery.  Leave your suitcase in the car.  After surgery it may be brought to your room.  For patients admitted to the hospital, discharge time will be determined by your treatment team.  Patients discharged the day of surgery will not be allowed to drive home.   [facility]  Before surgery, you can play an important role.  Because skin is not sterile, your skin needs to be as free of germs as possible.  You can reduce the number of germs on you skin by washing with CHG (chlorahexidine gluconate) soap before surgery.  CHG is an antiseptic cleaner  which kills germs and bonds with the skin to continue killing germs even after washing.  Please DO NOT use if you have an allergy to CHG or antibacterial soaps.  If your skin becomes reddened/irritated stop using the CHG and inform your nurse when you arrive at Short Stay.  Do not shave (including legs and underarms) for at least 48 hours prior to the first CHG shower.  You may shave your face.  Please follow these instructions carefully:   1.  Shower with CHG Soap the night before surgery and the                                morning of Surgery.  2.  If you choose to wash your hair, wash your hair first as usual with your       normal shampoo.  3.  After you shampoo, rinse your hair and body thoroughly to remove the                      Shampoo.  4.  Use CHG as you would any other liquid soap.  You can apply chg directly       to the skin and wash gently with scrungie or a clean washcloth.  5.  Apply the CHG Soap to your body ONLY FROM THE NECK DOWN.  Do not use on open wounds or open sores.  Avoid contact with your eyes,       ears, mouth and genitals (private parts).  Wash genitals (private parts)       with your normal soap.  6.  Wash thoroughly, paying special attention to the area where your surgery        will be performed.  7.  Thoroughly rinse your body with warm water from the neck down.  8.  DO NOT shower/wash with your normal soap after using and rinsing off       the CHG Soap.  9.  Pat yourself dry with a clean towel.            10.  Wear clean pajamas.            11.  Place clean sheets on your bed the night of your first shower and do not        sleep with pets.  Day of Surgery  Do not apply any lotions/deoderants the morning of surgery.  Please wear clean clothes to the hospital/surgery center.    Please read over the following fact sheets that you were given. Coughing and Deep Breathing

## 2016-10-22 NOTE — Progress Notes (Signed)
Boost breeze given to pt to drink 2 hours prior to arrival

## 2016-10-23 ENCOUNTER — Ambulatory Visit
Admission: RE | Admit: 2016-10-23 | Discharge: 2016-10-23 | Disposition: A | Discharge status: Home / Self Care | Payer: Medicaid Other | Source: Ambulatory Visit | Attending: General Surgery | Admitting: General Surgery

## 2016-10-23 DIAGNOSIS — C50412 Malignant neoplasm of upper-outer quadrant of left female breast: Secondary | ICD-10-CM

## 2016-10-23 DIAGNOSIS — Z171 Estrogen receptor negative status [ER-]: Principal | ICD-10-CM

## 2016-10-27 NOTE — H&P (Signed)
Dawn Haynes 09/28/2016 10:34 AM Location: [facility] Patient #: [id number] DOB: 01/07/1981 Undefined / Language: [name] / Race: Black or African American Female   History of Present Illness Dawn Klein MD; 09/28/2016 11:16 AM) The patient is a 36 year old female who presents for a follow-up for Liver mass. Pt is a very lovely 36 yo female seen in consultation at the request of Dr. Lindi Haynes for breast cancer metastases to the liver. 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 This was a T3N1M1 left breast cancer with bilobar liver metastases. She was placed on palliative chemotherapy in January 2017. She received carboplatin and gemcitabine. This was switched to straight gemcitabine. 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 She had a dramatic response to chemotherapy. She has not had repeat breast imaging, but breast mass is no longer palpable. She had resolution of ascites and had her peritoneal catheter removed. CT imaging demonstrates that the only residual liver metastasis visible is a confluent area in the posterior right lobe. She has not had MRI imaging of this area. She feels very well. She has 6 children including a 56.9 year old boy at home. Her energy has rebounded. She last received chemotherapy in November 2017. She is currently on a chemo break.   After I saw her at her last visit, we did a PET scan to evaluate the activity in the liver. The liver was negative for residual malignancy. However, she did have some recurrent mild asymmetric hypermature metabolism and her left breast in the location of her previous breast cancer. Bilateral diagnostic mammogram was performed as well as ultrasound which confirmed this. There were 2 areas which were biopsied and were both positive for triple negative breast cancer. These were at 1:00 and at 2:30.  PET 08/20/16 IMPRESSION: 1. There is mildly asymmetric hypermetabolic  activity along the lateral glandular tissues of the breast, some of this could be postoperative but recurrence is not excluded and this may prompt breast imaging workup. 2. There is some scarring in the liver but no active malignancy is identified in the abdomen or pelvis. 3. Mildly prominent retroverted uterus with a small adnexal cystic lesion on the right. 4. Low-grade anterior mediastinal metabolic activity is thought to be due to benign thymic tissue.  dx mammo 09/15/16 IMPRESSION: 1. New suspicious calcifications in the outer left breast spanning a distance of approximately 4.1 cm.  2. Suspicious left breast masses, one of which appears to contain calcifications.  3. No definite residual mass seen at site of biopsied proven malignancy.  4. No mammographic evidence of malignancy in the right breast.  pathology 09/16/16 Diagnosis 1. Breast, left, needle core biopsy, 1 o'clock - INVASIVE DUCTAL CARCINOMA. - DUCTAL CARCINOMA IN SITU. - SEE COMMENT. 2. Breast, left, needle core biopsy, 2:30 o'clock - INVASIVE DUCTAL CARCINOMA. - DUCTAL CARCINOMA IN SITU. The carcinoma in parts 1 and 2 is grade 3 and has morphologically similar characteristics.    Allergies Dawn Haynes, [place]; 09/28/2016 10:35 AM) No Known Drug Allergies 08/03/2016  Medication History Dawn Haynes, [place]; 09/28/2016 10:36 AM) Vitamin D (2000UNIT Capsule, Oral) Active. No Current Medications (Taken starting 09/28/2016) Soluble Fiber Therapy (Oral) Active. Vitamin B-12 (100MCG Tablet, Oral) Active. Medications Reconciled    Review of Systems Dawn Klein MD; 09/28/2016 11:16 AM) All other systems negative  Vitals Dawn Haynes RMA; 09/28/2016 10:36 AM) 09/28/2016 10:36 AM Weight: 139 lb Height: 62in Body Surface Area: 1.64 m Body Mass Index: 25.42 kg/m  Temp.: 98.75F  Pulse: 86 (Regular)  BP: 106/80 (Sitting, Left Arm, Standard)       Physical Exam Dawn Klein MD;  09/28/2016 11:17 AM) General Mental Status-Alert. General Appearance-Consistent with stated age. Hydration-Well hydrated. Voice-Normal.  Chest and Lung Exam Chest and lung exam reveals -quiet, even and easy respiratory effort with no use of accessory muscles. Inspection Chest Wall - Normal. Back - normal.  Breast Note: Right breast is much smaller than the left breast. Left breast has thickening and fullness in the upper outer quadrant. It does feel like there is a mass around 1:30. This is vague. There is no lymphadenopathy palpable. She has no nipple retraction or skin dimpling. There is no breast lymphedema seen. Right breast is normal.   Cardiovascular Cardiovascular examination reveals -normal pedal pulses bilaterally. Note: regular rate and rhythm  Abdomen Inspection-Inspection Normal. Palpation/Percussion Palpation and Percussion of the abdomen reveal - Soft, Non Tender, No Rebound tenderness, No Rigidity (guarding) and No hepatosplenomegaly.    Assessment & Plan Dawn Klein MD; 09/28/2016 11:19 AM) MALIGNANT NEOPLASM OF UPPER-OUTER QUADRANT OF LEFT BREAST IN FEMALE, ESTROGEN RECEPTOR NEGATIVE (C50.412) Impression: The patient does have residual breast cancer in the left breast. I think it is reasonable to treat this primarily now that she is status post chemotherapy and the pad is otherwise negative. We'll plan to do her bracketed lumpectomy and sentinel lymph node biopsy. This breast is significantly larger than the opposite breast. I do think that there will be reasonable cosmesis despite having a larger lumpectomy.  The surgical procedure was described to the patient. I discussed the incision type and location and that we would need radiology involved on with a wire or seed marker and/or sentinel node.  The risks and benefits of the procedure were described to the patient and she wishes to proceed.  We discussed the risks bleeding, infection, damage to  other structures, need for further procedures/surgeries. We discussed the risk of seroma. The patient was advised if the area in the breast in cancer, we may need to go back to surgery for additional tissue to obtain negative margins or for a lymph node biopsy. The patient was advised that these are the most common complications, but that others can occur as well. They were advised against taking aspirin or other anti-inflammatory agents/blood thinners the week before surgery.  over 30 min spent in evaluation, examination, counseling, and coordination of care. >50% spent in counseling. Current Plans You are being scheduled for surgery- Our schedulers will call you.  You should hear from our office's scheduling department within 5 working days about the location, date, and time of surgery. We try to make accommodations for patient's preferences in scheduling surgery, but sometimes the OR schedule or the surgeon's schedule prevents [place] from making those accommodations.  If you have not heard from our office [phone]) in 5 working days, call the office and ask for your surgeon's nurse.  If you have other questions about your diagnosis, plan, or surgery, call the office and ask for your surgeon's nurse.  Pt Education - [facility] breast cancer surgery: discussed with patient and provided information.   Signed by Dawn Klein, MD (09/28/2016 11:19 AM)

## 2016-10-28 ENCOUNTER — Encounter (HOSPITAL_COMMUNITY)
Admission: RE | Disposition: A | Discharge status: Home / Self Care | Payer: Self-pay | Source: Ambulatory Visit | Attending: General Surgery

## 2016-10-28 ENCOUNTER — Ambulatory Visit
Admit: 2016-10-28 | Discharge: 2016-10-28 | Disposition: A | Discharge status: Home / Self Care | Payer: Medicaid Other | Attending: General Surgery | Admitting: General Surgery

## 2016-10-28 ENCOUNTER — Ambulatory Visit (HOSPITAL_COMMUNITY): Payer: Medicaid Other | Admitting: Certified Registered Nurse Anesthetist

## 2016-10-28 ENCOUNTER — Ambulatory Visit (HOSPITAL_COMMUNITY)
Admission: RE | Admit: 2016-10-28 | Discharge: 2016-10-28 | Disposition: A | Discharge status: Home / Self Care | Payer: Medicaid Other | Source: Ambulatory Visit | Attending: General Surgery | Admitting: General Surgery

## 2016-10-28 ENCOUNTER — Ambulatory Visit
Admission: RE | Admit: 2016-10-28 | Discharge: 2016-10-28 | Disposition: A | Discharge status: Home / Self Care | Payer: Medicaid Other | Source: Ambulatory Visit | Attending: General Surgery | Admitting: General Surgery

## 2016-10-28 ENCOUNTER — Encounter (HOSPITAL_COMMUNITY)
Admission: RE | Admit: 2016-10-28 | Discharge: 2016-10-28 | Disposition: A | Discharge status: Home / Self Care | Payer: Medicaid Other | Source: Ambulatory Visit | Attending: General Surgery | Admitting: General Surgery

## 2016-10-28 DIAGNOSIS — C50412 Malignant neoplasm of upper-outer quadrant of left female breast: Secondary | ICD-10-CM

## 2016-10-28 DIAGNOSIS — R188 Other ascites: Secondary | ICD-10-CM

## 2016-10-28 DIAGNOSIS — Z79899 Other long term (current) drug therapy: Secondary | ICD-10-CM | POA: Insufficient documentation

## 2016-10-28 DIAGNOSIS — Z9221 Personal history of antineoplastic chemotherapy: Secondary | ICD-10-CM | POA: Insufficient documentation

## 2016-10-28 DIAGNOSIS — Z171 Estrogen receptor negative status [ER-]: Principal | ICD-10-CM

## 2016-10-28 DIAGNOSIS — C787 Secondary malignant neoplasm of liver and intrahepatic bile duct: Secondary | ICD-10-CM

## 2016-10-28 HISTORY — PX: BREAST LUMPECTOMY WITH RADIOACTIVE SEED AND SENTINEL LYMPH NODE BIOPSY: SHX6550

## 2016-10-28 SURGERY — BREAST LUMPECTOMY WITH RADIOACTIVE SEED AND SENTINEL LYMPH NODE BIOPSY
Anesthesia: Regional | Site: Breast | Laterality: Left

## 2016-10-28 MED ORDER — FENTANYL CITRATE (PF) 250 MCG/5ML IJ SOLN
INTRAMUSCULAR | Status: AC
  Filled 2016-10-28: qty 5

## 2016-10-28 MED ORDER — SODIUM CHLORIDE 0.9% FLUSH: 3.0000 mL | INTRAVENOUS | Status: DC | PRN

## 2016-10-28 MED ORDER — ACETAMINOPHEN 325 MG PO TABS: 650.0000 mg | ORAL_TABLET | ORAL | Status: DC | PRN

## 2016-10-28 MED ORDER — HYDROMORPHONE HCL 1 MG/ML IJ SOLN: 0.2500 mg | INTRAMUSCULAR | Status: DC | PRN

## 2016-10-28 MED ORDER — DEXAMETHASONE SODIUM PHOSPHATE 10 MG/ML IJ SOLN
INTRAMUSCULAR | Status: AC
  Filled 2016-10-28: qty 1

## 2016-10-28 MED ORDER — BUPIVACAINE-EPINEPHRINE (PF) 0.5% -1:200000 IJ SOLN
INTRAMUSCULAR | Status: DC | PRN
  Administered 2016-10-28: 30 mL

## 2016-10-28 MED ORDER — OXYCODONE HCL 5 MG PO TABS: 5.0000 mg | ORAL_TABLET | ORAL | 0 refills | Status: DC | PRN

## 2016-10-28 MED ORDER — LIDOCAINE HCL 1 % IJ SOLN
INTRAMUSCULAR | Status: AC
  Filled 2016-10-28: qty 20

## 2016-10-28 MED ORDER — BUPIVACAINE HCL (PF) 0.25 % IJ SOLN
INTRAMUSCULAR | Status: AC
  Filled 2016-10-28: qty 30

## 2016-10-28 MED ORDER — MEPERIDINE HCL 25 MG/ML IJ SOLN: 6.2500 mg | INTRAMUSCULAR | Status: DC | PRN

## 2016-10-28 MED ORDER — PHENYLEPHRINE HCL 10 MG/ML IJ SOLN
INTRAVENOUS | Status: DC | PRN
  Administered 2016-10-28: 40 ug/min via INTRAVENOUS

## 2016-10-28 MED ORDER — MIDAZOLAM HCL 2 MG/2ML IJ SOLN
INTRAMUSCULAR | Status: AC
  Administered 2016-10-28: 2 mg
  Filled 2016-10-28: qty 2

## 2016-10-28 MED ORDER — LACTATED RINGERS IV SOLN
INTRAVENOUS | Status: DC
  Administered 2016-10-28: 15:00:00 via INTRAVENOUS
  Administered 2016-10-28: 50 mL/h via INTRAVENOUS

## 2016-10-28 MED ORDER — ACETAMINOPHEN 650 MG RE SUPP: 650.0000 mg | RECTAL | Status: DC | PRN

## 2016-10-28 MED ORDER — TECHNETIUM TC 99M SULFUR COLLOID FILTERED
1.0000 | Freq: Once | INTRAVENOUS | Status: AC | PRN
  Administered 2016-10-28: 1 via INTRADERMAL

## 2016-10-28 MED ORDER — FENTANYL CITRATE (PF) 100 MCG/2ML IJ SOLN
INTRAMUSCULAR | Status: AC
  Administered 2016-10-28: 100 ug
  Filled 2016-10-28: qty 2

## 2016-10-28 MED ORDER — 0.9 % SODIUM CHLORIDE (POUR BTL) OPTIME
TOPICAL | Status: DC | PRN
  Administered 2016-10-28: 1000 mL

## 2016-10-28 MED ORDER — SODIUM CHLORIDE 0.9 % IJ SOLN
INTRAVENOUS | Status: DC | PRN
  Administered 2016-10-28: 5 mL

## 2016-10-28 MED ORDER — MIDAZOLAM HCL 2 MG/2ML IJ SOLN
INTRAMUSCULAR | Status: AC
  Filled 2016-10-28: qty 2

## 2016-10-28 MED ORDER — ONDANSETRON HCL 4 MG/2ML IJ SOLN
INTRAMUSCULAR | Status: AC
  Filled 2016-10-28: qty 2

## 2016-10-28 MED ORDER — SODIUM CHLORIDE 0.9% FLUSH
3.0000 mL | Freq: Two times a day (BID) | INTRAVENOUS | Status: DC
Start: 2016-10-28 — End: 2016-10-28

## 2016-10-28 MED ORDER — PROPOFOL 10 MG/ML IV BOLUS
INTRAVENOUS | Status: AC
  Filled 2016-10-28: qty 20

## 2016-10-28 MED ORDER — LIDOCAINE HCL 1 % IJ SOLN
INTRAMUSCULAR | Status: DC | PRN
  Administered 2016-10-28: 16 mL

## 2016-10-28 MED ORDER — MIDAZOLAM HCL 2 MG/2ML IJ SOLN
INTRAMUSCULAR | Status: DC | PRN
  Administered 2016-10-28: 2 mg via INTRAVENOUS

## 2016-10-28 MED ORDER — PROPOFOL 10 MG/ML IV BOLUS
INTRAVENOUS | Status: DC | PRN
  Administered 2016-10-28: 200 mg via INTRAVENOUS

## 2016-10-28 MED ORDER — ONDANSETRON HCL 4 MG/2ML IJ SOLN
INTRAMUSCULAR | Status: DC | PRN
  Administered 2016-10-28: 4 mg via INTRAVENOUS

## 2016-10-28 MED ORDER — LIDOCAINE 2% (20 MG/ML) 5 ML SYRINGE
INTRAMUSCULAR | Status: DC | PRN
  Administered 2016-10-28: 100 mg via INTRAVENOUS

## 2016-10-28 MED ORDER — CEFAZOLIN SODIUM-DEXTROSE 2-4 GM/100ML-% IV SOLN
2.0000 g | INTRAVENOUS | Status: AC
  Administered 2016-10-28: 2 g via INTRAVENOUS
  Filled 2016-10-28: qty 100

## 2016-10-28 MED ORDER — PROMETHAZINE HCL 25 MG/ML IJ SOLN: 6.2500 mg | INTRAMUSCULAR | Status: DC | PRN

## 2016-10-28 MED ORDER — SODIUM CHLORIDE 0.9 % IV SOLN: 250.0000 mL | INTRAVENOUS | Status: DC | PRN

## 2016-10-28 MED ORDER — FENTANYL CITRATE (PF) 100 MCG/2ML IJ SOLN
INTRAMUSCULAR | Status: DC | PRN
  Administered 2016-10-28: 50 ug via INTRAVENOUS

## 2016-10-28 MED ORDER — DEXAMETHASONE SODIUM PHOSPHATE 10 MG/ML IJ SOLN
INTRAMUSCULAR | Status: DC | PRN
  Administered 2016-10-28: 10 mg via INTRAVENOUS

## 2016-10-28 MED ORDER — LIDOCAINE 2% (20 MG/ML) 5 ML SYRINGE
INTRAMUSCULAR | Status: AC
  Filled 2016-10-28: qty 5

## 2016-10-28 MED ORDER — MIDAZOLAM HCL 2 MG/2ML IJ SOLN: 0.5000 mg | Freq: Once | INTRAMUSCULAR | Status: DC | PRN

## 2016-10-28 MED ORDER — METHYLENE BLUE 0.5 % INJ SOLN
INTRAVENOUS | Status: AC
  Filled 2016-10-28: qty 10

## 2016-10-28 MED ORDER — OXYCODONE HCL 5 MG PO TABS: 5.0000 mg | ORAL_TABLET | ORAL | Status: DC | PRN

## 2016-10-28 SURGICAL SUPPLY — 53 items
APPLIER CLIP 9.375 MED OPEN (MISCELLANEOUS) ×3
BINDER BREAST LRG (GAUZE/BANDAGES/DRESSINGS) ×3 IMPLANT
BINDER BREAST XLRG (GAUZE/BANDAGES/DRESSINGS) IMPLANT
BLADE SURG 10 STRL SS (BLADE) ×3 IMPLANT
BNDG COHESIVE 4X5 TAN STRL (GAUZE/BANDAGES/DRESSINGS) ×3 IMPLANT
CANISTER SUCT 3000ML PPV (MISCELLANEOUS) ×3 IMPLANT
CHLORAPREP W/TINT 26ML (MISCELLANEOUS) ×3 IMPLANT
CLIP APPLIE 9.375 MED OPEN (MISCELLANEOUS) ×1 IMPLANT
CLIP TI LARGE 6 (CLIP) ×3 IMPLANT
CLIP TI MEDIUM 6 (CLIP) ×3 IMPLANT
CLIP TI WIDE RED SMALL 6 (CLIP) ×3 IMPLANT
CONT SPEC 4OZ CLIKSEAL STRL BL (MISCELLANEOUS) IMPLANT
COVER MAYO STAND STRL (DRAPES) ×3 IMPLANT
COVER PROBE W GEL 5X96 (DRAPES) ×3 IMPLANT
COVER SURGICAL LIGHT HANDLE (MISCELLANEOUS) ×3 IMPLANT
DERMABOND ADVANCED (GAUZE/BANDAGES/DRESSINGS) ×2
DERMABOND ADVANCED .7 DNX12 (GAUZE/BANDAGES/DRESSINGS) ×1 IMPLANT
DEVICE DUBIN SPECIMEN MAMMOGRA (MISCELLANEOUS) IMPLANT
DRAPE CHEST BREAST 15X10 FENES (DRAPES) ×3 IMPLANT
DRAPE UNIVERSAL PACK (DRAPES) ×3 IMPLANT
DRAPE UTILITY XL STRL (DRAPES) ×6 IMPLANT
ELECT CAUTERY BLADE 6.4 (BLADE) ×3 IMPLANT
ELECT REM PT RETURN 9FT ADLT (ELECTROSURGICAL) ×3
ELECTRODE REM PT RTRN 9FT ADLT (ELECTROSURGICAL) ×1 IMPLANT
GLOVE BIO SURGEON STRL SZ 6 (GLOVE) ×3 IMPLANT
GLOVE BIOGEL PI IND STRL 6.5 (GLOVE) ×1 IMPLANT
GLOVE BIOGEL PI INDICATOR 6.5 (GLOVE) ×2
GOWN STRL REUS W/ TWL LRG LVL3 (GOWN DISPOSABLE) ×1 IMPLANT
GOWN STRL REUS W/TWL 2XL LVL3 (GOWN DISPOSABLE) ×3 IMPLANT
GOWN STRL REUS W/TWL LRG LVL3 (GOWN DISPOSABLE) ×2
KIT BASIN OR (CUSTOM PROCEDURE TRAY) ×3 IMPLANT
KIT MARKER MARGIN INK (KITS) ×3 IMPLANT
LIGHT WAVEGUIDE WIDE FLAT (MISCELLANEOUS) IMPLANT
NDL SAFETY ECLIPSE 18X1.5 (NEEDLE) IMPLANT
NEEDLE FILTER BLUNT 18X 1/2SAF (NEEDLE)
NEEDLE FILTER BLUNT 18X1 1/2 (NEEDLE) IMPLANT
NEEDLE HYPO 18GX1.5 SHARP (NEEDLE)
NEEDLE HYPO 25GX1X1/2 BEV (NEEDLE) ×3 IMPLANT
NS IRRIG 1000ML POUR BTL (IV SOLUTION) ×3 IMPLANT
PACK SURGICAL SETUP 50X90 (CUSTOM PROCEDURE TRAY) ×3 IMPLANT
PAD ABD 8X10 STRL (GAUZE/BANDAGES/DRESSINGS) ×3 IMPLANT
PENCIL BUTTON HOLSTER BLD 10FT (ELECTRODE) ×3 IMPLANT
SPONGE LAP 18X18 X RAY DECT (DISPOSABLE) ×3 IMPLANT
STOCKINETTE IMPERVIOUS 9X36 MD (GAUZE/BANDAGES/DRESSINGS) ×3 IMPLANT
SUT MNCRL AB 4-0 PS2 18 (SUTURE) ×3 IMPLANT
SUT VIC AB 3-0 SH 8-18 (SUTURE) ×3 IMPLANT
SYR BULB 3OZ (MISCELLANEOUS) ×3 IMPLANT
SYR CONTROL 10ML LL (SYRINGE) ×3 IMPLANT
TOWEL OR 17X24 6PK STRL BLUE (TOWEL DISPOSABLE) ×3 IMPLANT
TOWEL OR 17X26 10 PK STRL BLUE (TOWEL DISPOSABLE) ×3 IMPLANT
TUBE CONNECTING 12'X1/4 (SUCTIONS) ×1
TUBE CONNECTING 12X1/4 (SUCTIONS) ×2 IMPLANT
YANKAUER SUCT BULB TIP NO VENT (SUCTIONS) ×3 IMPLANT

## 2016-10-28 NOTE — Op Note (Signed)
Left Breast Radioactive seed localized lumpectomy, bracketed seed lumpectomy, and sentinel lymph node mapping and  biopsy  Indications: This patient presents with history of metastatic disease, but it went away with chemo.  She had residual left breast primary and some calcifications.    Pre-operative Diagnosis: left breast cancer  Post-operative Diagnosis: same  Surgeon: [name]   Anesthesia: General endotracheal anesthesia  ASA Class: 3  Procedure Details  The patient was seen in the Holding Room. The risks, benefits, complications, treatment options, and expected outcomes were discussed with the patient. The possibilities of bleeding, infection, the need for additional procedures, failure to diagnose a condition, and creating a complication requiring transfusion or operation were discussed with the patient. The patient concurred with the proposed plan, giving informed consent.  The site of surgery properly noted/marked. The patient was taken to Operating Room # 2, identified, and the procedure verified as Left Breast seed localized Lumpectomy, seed localized bracketed lumpectomy, sentinel lymph node mapping/biopsy. A Time Out was held and the above information confirmed.  The left arm, breast, and chest were prepped and draped in standard fashion. The lumpectomy was performed by creating an oblique incision in the upper outer quadrant of the breast over the previously placed radioactive seeds.  Dissection was carried down to around the point of maximum signal intensity. The cautery was used to perform the dissection.  This was going to be the subareolar lesion, but ended up being the 2:30 seed.  The subareolar seed was dissected out next.  The last seed at 1 "o'clock" was taken as a lumpectomy superiorly and a semicircle around the location where the 2:30 seed had been.    Hemostasis was achieved with cautery. The edges of the cavity were marked with large clips, with one each medial,  lateral, inferior and superior, and two clips posteriorly.   The specimen was inked with the margin marker paint kit.    Specimen radiography confirmed inclusion of the mammographic lesion, the clip, and the seed.  The background signal in the breast was zero.  The wound was irrigated and closed with 3-0 vicryl in layers and 4-0 monocryl subcuticular suture.    There was no good signal in the axilla.  An oblique incision was created below the axillary hairline.  Dissection was carried through the clavipectoral fascia. Twp deep level 2 sentinel nodes were removed.  They were both blue.  They were not hot.  There was no signal in the axilla.  The background count was 0 cps.  The wound was irrigated.  Hemostasis was achieved with cautery.  The axillary incision was closed with a 3-0 vicryl deep dermal interrupted sutures and a 4-0 monocryl subcuticular closure.    Sterile dressings were applied. At the end of the operation, all sponge, instrument, and needle counts were correct.  Findings: grossly clear surgical margins and no adenopathy.  Posterior margin is pectoralis.  Lateral margin is skin.  Specimen #3 is the most lateral, superior, and inferior margin.  Technetium did not map.    Estimated Blood Loss:  min         Specimens: left breast lumpectomy at 2;30, lumpectomy subareolar, lumpectomy including 1 o'clock seed/clip and the tissue going to 3 o'clock that was not included in the 2;30 biopsy.  Two deep axillary sentinel lymph nodes, blue.             Complications:  None; patient tolerated the procedure well.         Disposition: PACU -  hemodynamically stable.         Condition: stable

## 2016-10-28 NOTE — Anesthesia Procedure Notes (Signed)
Procedure Name: LMA Insertion Date/Time: 10/28/2016 1:49 PM Performed by: Mervyn Gay Pre-anesthesia Checklist: Patient identified, Patient being monitored, Timeout performed, Emergency Drugs available and Suction available Patient Re-evaluated:Patient Re-evaluated prior to inductionOxygen Delivery Method: Circle System Utilized Preoxygenation: Pre-oxygenation with 100% oxygen Intubation Type: IV induction Ventilation: Mask ventilation without difficulty LMA: LMA inserted LMA Size: 4.0 Number of attempts: 1 Placement Confirmation: positive ETCO2 and breath sounds checked- equal and bilateral Tube secured with: Tape Dental Injury: Teeth and Oropharynx as per pre-operative assessment

## 2016-10-28 NOTE — Anesthesia Preprocedure Evaluation (Addendum)
Anesthesia Evaluation  Patient identified by MRN, date of birth, ID band Patient awake    Reviewed: Allergy & Precautions, NPO status , Patient's Chart, lab work & pertinent test results  History of Anesthesia Complications Negative for: history of anesthetic complications  Airway Mallampati: I  TM Distance: >3 FB Neck ROM: Full    Dental  (+) Dental Advisory Given, Poor Dentition, Missing   Pulmonary neg pulmonary ROS,    breath sounds clear to auscultation       Cardiovascular negative cardio ROS   Rhythm:Regular Rate:Normal     Neuro/Psych negative neurological ROS     GI/Hepatic negative GI ROS, H/o peritonitis, and ascites with breast mets to liver: chemo   Endo/Other  negative endocrine ROS  Renal/GU negative Renal ROS     Musculoskeletal  (+) Arthritis ,   Abdominal   Peds  Hematology negative hematology ROS (+)   Anesthesia Other Findings Breast cancer: chemo  Reproductive/Obstetrics                            Anesthesia Physical Anesthesia Plan  ASA: III  Anesthesia Plan: General   Post-op Pain Management:  Regional for Post-op pain   Induction: Intravenous  Airway Management Planned: LMA  Additional Equipment:   Intra-op Plan:   Post-operative Plan:   Informed Consent: I have reviewed the patients History and Physical, chart, labs and discussed the procedure including the risks, benefits and alternatives for the proposed anesthesia with the patient or authorized representative who has indicated his/her understanding and acceptance.   Dental advisory given  Plan Discussed with: CRNA and Surgeon  Anesthesia Plan Comments: (Plan routine monitors, GA- LMA OK, pectoralis block for post op analgesia)        Anesthesia Quick Evaluation

## 2016-10-28 NOTE — Anesthesia Postprocedure Evaluation (Signed)
Anesthesia Post Note  Patient: Dawn Haynes  Procedure(s) Performed: Procedure(s) (LRB): LEFT BREAST LUMPECTOMY WITH BRACKETED RADIOACTIVE SEED AND SENTINEL LYMPH NODE BIOPSY (Left)  Patient location during evaluation: PACU Anesthesia Type: General and Regional Level of consciousness: sedated and patient cooperative Pain management: pain level controlled Vital Signs Assessment: post-procedure vital signs reviewed and stable Respiratory status: spontaneous breathing Cardiovascular status: stable Anesthetic complications: no       Last Vitals:  Vitals:   10/28/16 1612 10/28/16 1627  BP: (!) 128/96 117/82  Pulse: (!) 58 65  Resp: 20   Temp: 36.5 C     Last Pain:  Vitals:   10/28/16 1627  TempSrc:   PainSc: 0-No pain                 Nolon Nations

## 2016-10-28 NOTE — Transfer of Care (Signed)
Immediate Anesthesia Transfer of Care Note  Patient: [name]  Procedure(s) Performed: Procedure(s): LEFT BREAST LUMPECTOMY WITH BRACKETED RADIOACTIVE SEED AND SENTINEL LYMPH NODE BIOPSY (Left)  Patient Location: PACU  Anesthesia Type:General  Level of Consciousness: awake, alert , oriented and patient cooperative  Airway & Oxygen Therapy: Patient Spontanous Breathing and Patient connected to nasal cannula oxygen  Post-op Assessment: Report given to RN, Post -op Vital signs reviewed and stable and Patient moving all extremities  Post vital signs: Reviewed and stable  Last Vitals:  Vitals:   10/28/16 1135 10/28/16 1540  BP: 113/62 (P) 131/84  Pulse: 70 (P) 68  Resp: 13 (P) 11  Temp:  36.7 C    Last Pain:  Vitals:   10/28/16 0924  TempSrc: Oral      Patients Stated Pain Goal: 7 (63/33/54 [id number])  Complications: No apparent anesthesia complications

## 2016-10-28 NOTE — Anesthesia Procedure Notes (Signed)
Anesthesia Regional Block: Pectoralis block   Pre-Anesthetic Checklist: ,, timeout performed, Correct Patient, Correct Site, Correct Laterality, Correct Procedure, Correct Position, site marked, Risks and benefits discussed,  Surgical consent,  Pre-op evaluation,  At surgeon's request and post-op pain management  Laterality: Left  Prep: chloraprep       Needles:  Injection technique: Single-shot  Needle Type: Echogenic Needle     Needle Length: 9cm  Needle Gauge: 21     Additional Needles:   Procedures: ultrasound guided,,,,,,,,  Narrative:  Start time: 10/28/2016 11:20 AM End time: 10/28/2016 11:27 AM Injection made incrementally with aspirations every 5 mL.  Performed by: Personally  Anesthesiologist: Glennon Mac, Anupama Piehl  Additional Notes: Pt identified in Holding room.  Monitors applied. Working IV access confirmed. Sterile prep, drape L clavicle and pec.  #21ga ECHOgenic needle between pec minor and serratus, between ribs 4,5 with US guidance.  30cc 0.5% Bupivacaine with 1:200k epi injected incrementally after negative test dose, good spread of local.  Patient asymptomatic, VSS, no heme aspirated, tolerated well.  Jenita Seashore, MD

## 2016-10-28 NOTE — Interval H&P Note (Signed)
History and Physical Interval Note:  10/28/2016 1:23 PM  Dawn Haynes  has presented today for surgery, with the diagnosis of LEFT BREAST CANCER  The various methods of treatment have been discussed with the patient and family. After consideration of risks, benefits and other options for treatment, the patient has consented to  Procedure(s): LEFT BREAST LUMPECTOMY WITH BRACKETED RADIOACTIVE SEED AND SENTINEL LYMPH NODE BIOPSY (Left) as a surgical intervention .  The patient's history has been reviewed, patient examined, no change in status, stable for surgery.  I have reviewed the patient's chart and labs.  Questions were answered to the patient's satisfaction.     Cedric Denison

## 2016-10-28 NOTE — Discharge Instructions (Addendum)
[facility] °Office Phone Number [phone] ° °BREAST BIOPSY/ PARTIAL MASTECTOMY: POST OP INSTRUCTIONS ° °Always review your discharge instruction sheet given to you by the facility where your surgery was performed. ° °IF YOU HAVE DISABILITY OR FAMILY LEAVE FORMS, YOU MUST BRING THEM TO THE OFFICE FOR PROCESSING.  DO NOT GIVE THEM TO YOUR DOCTOR. ° °1. A prescription for pain medication may be given to you upon discharge.  Take your pain medication as prescribed, if needed.  If narcotic pain medicine is not needed, then you may take acetaminophen (Tylenol) or ibuprofen (Advil) as needed. °2. Take your usually prescribed medications unless otherwise directed °3. If you need a refill on your pain medication, please contact your pharmacy.  They will contact our office to request authorization.  Prescriptions will not be filled after 5pm or on week-ends. °4. You should eat very light the first 24 hours after surgery, such as soup, crackers, pudding, etc.  Resume your normal diet the day after surgery. °5. Most patients will experience some swelling and bruising in the breast.  Ice packs and a good support bra will help.  Swelling and bruising can take several days to resolve.  °6. It is common to experience some constipation if taking pain medication after surgery.  Increasing fluid intake and taking a stool softener will usually help or prevent this problem from occurring.  A mild laxative (Milk of Magnesia or Miralax) should be taken according to package directions if there are no bowel movements after 48 hours. °7. Unless discharge instructions indicate otherwise, you may remove your bandages 48 hours after surgery, and you may shower at that time.  You may have steri-strips (small skin tapes) in place directly over the incision.  These strips should be left on the skin for 7-10 days.   Any sutures or staples will be removed at the office during your follow-up visit. °8. ACTIVITIES:  You may resume  regular daily activities (gradually increasing) beginning the next day.  Wearing a good support bra or sports bra (or the breast binder) minimizes pain and swelling.  You may have sexual intercourse when it is comfortable. °a. You may drive when you no longer are taking prescription pain medication, you can comfortably wear a seatbelt, and you can safely maneuver your car and apply brakes. °b. RETURN TO WORK:  __________1 week_______________ °9. You should see your doctor in the office for a follow-up appointment approximately two weeks after your surgery.  Your doctor’s nurse will typically make your follow-up appointment when she calls you with your pathology report.  Expect your pathology report 2-3 business days after your surgery.  You may call to check if you do not hear from us after three days. ° ° °WHEN TO CALL YOUR DOCTOR: °1. Fever over 101.0 °2. Nausea and/or vomiting. °3. Extreme swelling or bruising. °4. Continued bleeding from incision. °5. Increased pain, redness, or drainage from the incision. ° °The clinic staff is available to answer your questions during regular business hours.  Please don’t hesitate to call and ask to speak to one of the nurses for clinical concerns.  If you have a medical emergency, go to the nearest emergency room or call 911.  A surgeon from [facility] is always on call at the hospital. ° °For further questions, please visit [email or web address]  ° °

## 2016-10-29 ENCOUNTER — Encounter (HOSPITAL_COMMUNITY): Payer: Self-pay | Admitting: General Surgery

## 2016-11-02 ENCOUNTER — Other Ambulatory Visit: Payer: Self-pay | Admitting: General Surgery

## 2016-11-02 ENCOUNTER — Telehealth: Payer: Self-pay | Admitting: General Surgery

## 2016-11-02 NOTE — Telephone Encounter (Signed)
Discussed pathology with patient and Dr. [name].  Will plan reexcision.  All of these areas are contiguous.

## 2016-11-04 ENCOUNTER — Encounter (HOSPITAL_BASED_OUTPATIENT_CLINIC_OR_DEPARTMENT_OTHER): Payer: Self-pay | Admitting: *Deleted

## 2016-11-04 NOTE — Progress Notes (Signed)
Bring all medications. Plans to come tomorrow to pick up Boost drink.

## 2016-11-09 ENCOUNTER — Ambulatory Visit (HOSPITAL_BASED_OUTPATIENT_CLINIC_OR_DEPARTMENT_OTHER): Payer: Medicaid Other | Admitting: Certified Registered Nurse Anesthetist

## 2016-11-09 ENCOUNTER — Encounter (HOSPITAL_BASED_OUTPATIENT_CLINIC_OR_DEPARTMENT_OTHER)
Admission: RE | Disposition: A | Discharge status: Home / Self Care | Payer: Self-pay | Source: Ambulatory Visit | Attending: General Surgery

## 2016-11-09 ENCOUNTER — Ambulatory Visit (HOSPITAL_BASED_OUTPATIENT_CLINIC_OR_DEPARTMENT_OTHER)
Admission: RE | Admit: 2016-11-09 | Discharge: 2016-11-09 | Disposition: A | Discharge status: Home / Self Care | Payer: Medicaid Other | Source: Ambulatory Visit | Attending: General Surgery | Admitting: General Surgery

## 2016-11-09 ENCOUNTER — Encounter (HOSPITAL_BASED_OUTPATIENT_CLINIC_OR_DEPARTMENT_OTHER): Payer: Self-pay | Admitting: *Deleted

## 2016-11-09 DIAGNOSIS — M1990 Unspecified osteoarthritis, unspecified site: Secondary | ICD-10-CM | POA: Insufficient documentation

## 2016-11-09 DIAGNOSIS — C50912 Malignant neoplasm of unspecified site of left female breast: Secondary | ICD-10-CM | POA: Diagnosis not present

## 2016-11-09 DIAGNOSIS — Z8505 Personal history of malignant neoplasm of liver: Secondary | ICD-10-CM | POA: Insufficient documentation

## 2016-11-09 DIAGNOSIS — Z9221 Personal history of antineoplastic chemotherapy: Secondary | ICD-10-CM | POA: Insufficient documentation

## 2016-11-09 HISTORY — PX: RE-EXCISION OF BREAST LUMPECTOMY: SHX6048

## 2016-11-09 SURGERY — EXCISION, LESION, BREAST
Anesthesia: General | Site: Breast | Laterality: Left

## 2016-11-09 MED ORDER — FENTANYL CITRATE (PF) 100 MCG/2ML IJ SOLN
INTRAMUSCULAR | Status: AC
  Filled 2016-11-09: qty 2

## 2016-11-09 MED ORDER — ACETAMINOPHEN 650 MG RE SUPP: 650.0000 mg | RECTAL | Status: DC | PRN

## 2016-11-09 MED ORDER — SCOPOLAMINE 1 MG/3DAYS TD PT72: 1.0000 | MEDICATED_PATCH | Freq: Once | TRANSDERMAL | Status: DC | PRN

## 2016-11-09 MED ORDER — LIDOCAINE HCL (CARDIAC) 20 MG/ML IV SOLN
INTRAVENOUS | Status: DC | PRN
  Administered 2016-11-09: 50 mg via INTRAVENOUS

## 2016-11-09 MED ORDER — DEXAMETHASONE SODIUM PHOSPHATE 10 MG/ML IJ SOLN
INTRAMUSCULAR | Status: AC
  Filled 2016-11-09: qty 1

## 2016-11-09 MED ORDER — ACETAMINOPHEN 325 MG PO TABS: 650.0000 mg | ORAL_TABLET | ORAL | Status: DC | PRN

## 2016-11-09 MED ORDER — CHLORHEXIDINE GLUCONATE CLOTH 2 % EX PADS: 6.0000 | MEDICATED_PAD | Freq: Once | CUTANEOUS | Status: DC

## 2016-11-09 MED ORDER — MIDAZOLAM HCL 2 MG/2ML IJ SOLN
INTRAMUSCULAR | Status: AC
  Filled 2016-11-09: qty 2

## 2016-11-09 MED ORDER — METOCLOPRAMIDE HCL 5 MG/ML IJ SOLN: 10.0000 mg | Freq: Once | INTRAMUSCULAR | Status: DC | PRN

## 2016-11-09 MED ORDER — SODIUM CHLORIDE 0.9 % IV SOLN
250.0000 mL | INTRAVENOUS | Status: DC | PRN
Start: 2016-11-09 — End: 2016-11-09

## 2016-11-09 MED ORDER — ONDANSETRON HCL 4 MG/2ML IJ SOLN
INTRAMUSCULAR | Status: DC | PRN
  Administered 2016-11-09: 4 mg via INTRAVENOUS

## 2016-11-09 MED ORDER — MEPERIDINE HCL 25 MG/ML IJ SOLN: 6.2500 mg | INTRAMUSCULAR | Status: DC | PRN

## 2016-11-09 MED ORDER — FENTANYL CITRATE (PF) 100 MCG/2ML IJ SOLN: 25.0000 ug | INTRAMUSCULAR | Status: DC | PRN

## 2016-11-09 MED ORDER — SODIUM CHLORIDE 0.9% FLUSH: 3.0000 mL | Freq: Two times a day (BID) | INTRAVENOUS | Status: DC

## 2016-11-09 MED ORDER — CEFAZOLIN SODIUM-DEXTROSE 2-4 GM/100ML-% IV SOLN
INTRAVENOUS | Status: AC
  Filled 2016-11-09: qty 100

## 2016-11-09 MED ORDER — CEFAZOLIN SODIUM-DEXTROSE 2-4 GM/100ML-% IV SOLN
2.0000 g | INTRAVENOUS | Status: AC
  Administered 2016-11-09: 2 g via INTRAVENOUS

## 2016-11-09 MED ORDER — ONDANSETRON HCL 4 MG/2ML IJ SOLN
INTRAMUSCULAR | Status: AC
  Filled 2016-11-09: qty 2

## 2016-11-09 MED ORDER — LACTATED RINGERS IV SOLN: INTRAVENOUS | Status: DC

## 2016-11-09 MED ORDER — LIDOCAINE 2% (20 MG/ML) 5 ML SYRINGE
INTRAMUSCULAR | Status: AC
  Filled 2016-11-09: qty 5

## 2016-11-09 MED ORDER — PROPOFOL 500 MG/50ML IV EMUL
INTRAVENOUS | Status: AC
  Filled 2016-11-09: qty 50

## 2016-11-09 MED ORDER — LIDOCAINE-EPINEPHRINE (PF) 1 %-1:200000 IJ SOLN
INTRAMUSCULAR | Status: DC | PRN
  Administered 2016-11-09: 10 mL via INTRAMUSCULAR

## 2016-11-09 MED ORDER — DEXAMETHASONE SODIUM PHOSPHATE 10 MG/ML IJ SOLN
INTRAMUSCULAR | Status: DC | PRN
  Administered 2016-11-09: 5 mg via INTRAVENOUS

## 2016-11-09 MED ORDER — OXYCODONE HCL 5 MG PO TABS: 5.0000 mg | ORAL_TABLET | ORAL | Status: DC | PRN

## 2016-11-09 MED ORDER — PROPOFOL 10 MG/ML IV BOLUS
INTRAVENOUS | Status: DC | PRN
  Administered 2016-11-09: 200 mg via INTRAVENOUS

## 2016-11-09 MED ORDER — SODIUM CHLORIDE 0.9% FLUSH: 3.0000 mL | INTRAVENOUS | Status: DC | PRN

## 2016-11-09 MED ORDER — FENTANYL CITRATE (PF) 100 MCG/2ML IJ SOLN
50.0000 ug | INTRAMUSCULAR | Status: DC | PRN
  Administered 2016-11-09 (×2): 50 ug via INTRAVENOUS

## 2016-11-09 MED ORDER — MIDAZOLAM HCL 2 MG/2ML IJ SOLN
1.0000 mg | INTRAMUSCULAR | Status: DC | PRN
  Administered 2016-11-09: 2 mg via INTRAVENOUS

## 2016-11-09 MED ORDER — LACTATED RINGERS IV SOLN
INTRAVENOUS | Status: DC
  Administered 2016-11-09: 11:00:00 via INTRAVENOUS

## 2016-11-09 SURGICAL SUPPLY — 50 items
BINDER BREAST LRG (GAUZE/BANDAGES/DRESSINGS) ×3 IMPLANT
BLADE SURG 15 STRL LF DISP TIS (BLADE) ×1 IMPLANT
BLADE SURG 15 STRL SS (BLADE) ×2
CANISTER SUCT 1200ML W/VALVE (MISCELLANEOUS) ×3 IMPLANT
CHLORAPREP W/TINT 26ML (MISCELLANEOUS) ×3 IMPLANT
CLIP TI LARGE 6 (CLIP) ×3 IMPLANT
CLOSURE WOUND 1/2 X4 (GAUZE/BANDAGES/DRESSINGS) ×1
COVER BACK TABLE 60X90IN (DRAPES) ×3 IMPLANT
COVER MAYO STAND STRL (DRAPES) ×3 IMPLANT
DERMABOND ADVANCED (GAUZE/BANDAGES/DRESSINGS) ×2
DERMABOND ADVANCED .7 DNX12 (GAUZE/BANDAGES/DRESSINGS) ×1 IMPLANT
DRAPE LAPAROSCOPIC ABDOMINAL (DRAPES) ×3 IMPLANT
DRAPE UTILITY XL STRL (DRAPES) ×3 IMPLANT
ELECT COATED BLADE 2.86 ST (ELECTRODE) ×3 IMPLANT
ELECT REM PT RETURN 9FT ADLT (ELECTROSURGICAL) ×3
ELECTRODE REM PT RTRN 9FT ADLT (ELECTROSURGICAL) ×1 IMPLANT
GAUZE SPONGE 4X4 12PLY STRL (GAUZE/BANDAGES/DRESSINGS) IMPLANT
GAUZE SPONGE 4X4 12PLY STRL LF (GAUZE/BANDAGES/DRESSINGS) ×3 IMPLANT
GLOVE BIO SURGEON STRL SZ 6 (GLOVE) ×3 IMPLANT
GLOVE BIO SURGEON STRL SZ7 (GLOVE) ×6 IMPLANT
GLOVE BIOGEL PI IND STRL 6.5 (GLOVE) ×1 IMPLANT
GLOVE BIOGEL PI IND STRL 7.0 (GLOVE) ×2 IMPLANT
GLOVE BIOGEL PI INDICATOR 6.5 (GLOVE) ×2
GLOVE BIOGEL PI INDICATOR 7.0 (GLOVE) ×4
GLOVE SURG SS PI 7.5 STRL IVOR (GLOVE) ×3 IMPLANT
GOWN STRL REUS W/ TWL LRG LVL3 (GOWN DISPOSABLE) ×3 IMPLANT
GOWN STRL REUS W/TWL 2XL LVL3 (GOWN DISPOSABLE) ×3 IMPLANT
GOWN STRL REUS W/TWL LRG LVL3 (GOWN DISPOSABLE) ×6
KIT MARKER MARGIN INK (KITS) ×3 IMPLANT
LIGHT WAVEGUIDE WIDE FLAT (MISCELLANEOUS) ×3 IMPLANT
NEEDLE HYPO 25X1 1.5 SAFETY (NEEDLE) ×3 IMPLANT
NS IRRIG 1000ML POUR BTL (IV SOLUTION) ×3 IMPLANT
PACK BASIN DAY SURGERY FS (CUSTOM PROCEDURE TRAY) ×3 IMPLANT
PENCIL BUTTON HOLSTER BLD 10FT (ELECTRODE) ×3 IMPLANT
SHEET MEDIUM DRAPE 40X70 STRL (DRAPES) ×3 IMPLANT
SLEEVE SCD COMPRESS KNEE MED (MISCELLANEOUS) ×3 IMPLANT
SPONGE LAP 18X18 X RAY DECT (DISPOSABLE) ×3 IMPLANT
STRIP CLOSURE SKIN 1/2X4 (GAUZE/BANDAGES/DRESSINGS) ×2 IMPLANT
SUT MON AB 4-0 PC3 18 (SUTURE) ×3 IMPLANT
SUT VIC AB 3-0 54X BRD REEL (SUTURE) IMPLANT
SUT VIC AB 3-0 BRD 54 (SUTURE)
SUT VIC AB 3-0 SH 27 (SUTURE) ×2
SUT VIC AB 3-0 SH 27X BRD (SUTURE) ×1 IMPLANT
SYR BULB 3OZ (MISCELLANEOUS) ×3 IMPLANT
SYR CONTROL 10ML LL (SYRINGE) ×3 IMPLANT
TOWEL OR 17X24 6PK STRL BLUE (TOWEL DISPOSABLE) ×3 IMPLANT
TOWEL OR NON WOVEN STRL DISP B (DISPOSABLE) ×3 IMPLANT
TUBE CONNECTING 20'X1/4 (TUBING) ×1
TUBE CONNECTING 20X1/4 (TUBING) ×2 IMPLANT
YANKAUER SUCT BULB TIP NO VENT (SUCTIONS) ×3 IMPLANT

## 2016-11-09 NOTE — H&P (View-Only) (Signed)
Dawn Haynes 09/28/2016 10:34 AM Location: [facility] Patient #: [id number] DOB: [birth year] Undefined / Language: Dawn Haynes / Race: Black or African American Female   History of Present Illness [name] MD; 09/28/2016 11:16 AM) The patient is a 36 year old female who presents for a follow-up for Liver mass. Pt is a very lovely 36 yo female seen in consultation at the request of Dr. [name] for breast cancer metastases to the liver. 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 This was a T3N1M1 left breast cancer with bilobar liver metastases. She was placed on palliative chemotherapy in January 2017. She received carboplatin and gemcitabine. This was switched to straight gemcitabine. 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 She had a dramatic response to chemotherapy. She has not had repeat breast imaging, but breast mass is no longer palpable. She had resolution of ascites and had her peritoneal catheter removed. CT imaging demonstrates that the only residual liver metastasis visible is a confluent area in the posterior right lobe. She has not had MRI imaging of this area. She feels very well. She has 6 children including a 64.79 year old boy at home. Her energy has rebounded. She last received chemotherapy in November 2017. She is currently on a chemo break.   After I saw her at her last visit, we did a PET scan to evaluate the activity in the liver. The liver was negative for residual malignancy. However, she did have some recurrent mild asymmetric hypermature metabolism and her left breast in the location of her previous breast cancer. Bilateral diagnostic mammogram was performed as well as ultrasound which confirmed this. There were 2 areas which were biopsied and were both positive for triple negative breast cancer. These were at 1:00 and at 2:30.  PET 08/20/16 IMPRESSION: 1. There is mildly asymmetric hypermetabolic  activity along the lateral glandular tissues of the breast, some of this could be postoperative but recurrence is not excluded and this may prompt breast imaging workup. 2. There is some scarring in the liver but no active malignancy is identified in the abdomen or pelvis. 3. Mildly prominent retroverted uterus with a small adnexal cystic lesion on the right. 4. Low-grade anterior mediastinal metabolic activity is thought to be due to benign thymic tissue.  dx mammo 09/15/16 IMPRESSION: 1. New suspicious calcifications in the outer left breast spanning a distance of approximately 4.1 cm.  2. Suspicious left breast masses, one of which appears to contain calcifications.  3. No definite residual mass seen at site of biopsied proven malignancy.  4. No mammographic evidence of malignancy in the right breast.  pathology 09/16/16 Diagnosis 1. Breast, left, needle core biopsy, 1 o'clock - INVASIVE DUCTAL CARCINOMA. - DUCTAL CARCINOMA IN SITU. - SEE COMMENT. 2. Breast, left, needle core biopsy, 2:30 o'clock - INVASIVE DUCTAL CARCINOMA. - DUCTAL CARCINOMA IN SITU. The carcinoma in parts 1 and 2 is grade 3 and has morphologically similar characteristics.    Allergies Dawn Haynes, [place]; 09/28/2016 10:35 AM) No Known Drug Allergies 08/03/2016  Medication History Dawn Haynes, [place]; 09/28/2016 10:36 AM) Vitamin D (2000UNIT Capsule, Oral) Active. No Current Medications (Taken starting 09/28/2016) Soluble Fiber Therapy (Oral) Active. Vitamin B-12 (100MCG Tablet, Oral) Active. Medications Reconciled    Review of Systems [name] MD; 09/28/2016 11:16 AM) All other systems negative  Vitals Dawn Haynes RMA; 09/28/2016 10:36 AM) 09/28/2016 10:36 AM Weight: 139 lb Height: 62in Body Surface Area: 1.64 m Body Mass Index: 25.42 kg/m  Temp.: 98.15F  Pulse: 86 (Regular)  BP: 106/80 (Sitting, Left Arm, Standard)       Physical Exam [name] MD;  09/28/2016 11:17 AM) General Mental Status-Alert. General Appearance-Consistent with stated age. Hydration-Well hydrated. Voice-Normal.  Chest and Lung Exam Chest and lung exam reveals -quiet, even and easy respiratory effort with no use of accessory muscles. Inspection Chest Wall - Normal. Back - normal.  Breast Note: Right breast is much smaller than the left breast. Left breast has thickening and fullness in the upper outer quadrant. It does feel like there is a mass around 1:30. This is vague. There is no lymphadenopathy palpable. She has no nipple retraction or skin dimpling. There is no breast lymphedema seen. Right breast is normal.   Cardiovascular Cardiovascular examination reveals -normal pedal pulses bilaterally. Note: regular rate and rhythm  Abdomen Inspection-Inspection Normal. Palpation/Percussion Palpation and Percussion of the abdomen reveal - Soft, Non Tender, No Rebound tenderness, No Rigidity (guarding) and No hepatosplenomegaly.    Assessment & Plan [name] MD; 09/28/2016 11:19 AM) MALIGNANT NEOPLASM OF UPPER-OUTER QUADRANT OF LEFT BREAST IN FEMALE, ESTROGEN RECEPTOR NEGATIVE (C50.412) Impression: The patient does have residual breast cancer in the left breast. I think it is reasonable to treat this primarily now that she is status post chemotherapy and the pad is otherwise negative. We'll plan to do her bracketed lumpectomy and sentinel lymph node biopsy. This breast is significantly larger than the opposite breast. I do think that there will be reasonable cosmesis despite having a larger lumpectomy.  The surgical procedure was described to the patient. I discussed the incision type and location and that we would need radiology involved on with a wire or seed marker and/or sentinel node.  The risks and benefits of the procedure were described to the patient and she wishes to proceed.  We discussed the risks bleeding, infection, damage to  other structures, need for further procedures/surgeries. We discussed the risk of seroma. The patient was advised if the area in the breast in cancer, we may need to go back to surgery for additional tissue to obtain negative margins or for a lymph node biopsy. The patient was advised that these are the most common complications, but that others can occur as well. They were advised against taking aspirin or other anti-inflammatory agents/blood thinners the week before surgery.  over 30 min spent in evaluation, examination, counseling, and coordination of care. >50% spent in counseling. Current Plans You are being scheduled for surgery- Our schedulers will call you.  You should hear from our office's scheduling department within 5 working days about the location, date, and time of surgery. We try to make accommodations for patient's preferences in scheduling surgery, but sometimes the OR schedule or the surgeon's schedule prevents [place] from making those accommodations.  If you have not heard from our office [phone]) in 5 working days, call the office and ask for your surgeon's nurse.  If you have other questions about your diagnosis, plan, or surgery, call the office and ask for your surgeon's nurse.  Pt Education - flb breast cancer surgery: discussed with patient and provided information.   Signed by [name], MD (09/28/2016 11:19 AM)

## 2016-11-09 NOTE — Anesthesia Preprocedure Evaluation (Signed)
Anesthesia Evaluation  Patient identified by MRN, date of birth, ID band Patient awake    Reviewed: Allergy & Precautions, NPO status , Patient's Chart, lab work & pertinent test results  History of Anesthesia Complications Negative for: history of anesthetic complications  Airway Mallampati: I  TM Distance: >3 FB Neck ROM: Full    Dental  (+) Dental Advisory Given, Poor Dentition, Missing   Pulmonary neg pulmonary ROS,    breath sounds clear to auscultation       Cardiovascular negative cardio ROS   Rhythm:Regular Rate:Normal     Neuro/Psych negative neurological ROS     GI/Hepatic negative GI ROS, H/o peritonitis, and ascites with breast mets to liver: chemo   Endo/Other  negative endocrine ROS  Renal/GU negative Renal ROS     Musculoskeletal  (+) Arthritis ,   Abdominal   Peds  Hematology negative hematology ROS (+)   Anesthesia Other Findings Breast cancer: chemo  Reproductive/Obstetrics                             Anesthesia Physical  Anesthesia Plan  ASA: III  Anesthesia Plan: General   Post-op Pain Management:    Induction: Intravenous  Airway Management Planned: LMA  Additional Equipment:   Intra-op Plan:   Post-operative Plan:   Informed Consent: I have reviewed the patients History and Physical, chart, labs and discussed the procedure including the risks, benefits and alternatives for the proposed anesthesia with the patient or authorized representative who has indicated his/her understanding and acceptance.   Dental advisory given  Plan Discussed with: CRNA and Surgeon  Anesthesia Plan Comments:         Anesthesia Quick Evaluation

## 2016-11-09 NOTE — Anesthesia Postprocedure Evaluation (Signed)
Anesthesia Post Note  Patient: [name]  Procedure(s) Performed: Procedure(s) (LRB): RE-EXCISION OF BREAST LUMPECTOMY (Left)  Patient location during evaluation: PACU Anesthesia Type: General Level of consciousness: awake and alert Pain management: pain level controlled Vital Signs Assessment: post-procedure vital signs reviewed and stable Respiratory status: spontaneous breathing, nonlabored ventilation, respiratory function stable and patient connected to nasal cannula oxygen Cardiovascular status: blood pressure returned to baseline and stable Postop Assessment: no signs of nausea or vomiting Anesthetic complications: no       Last Vitals:  Vitals:   11/09/16 1330 11/09/16 1400  BP: 123/79 108/68  Pulse: 66 73  Resp: 16 16  Temp:  36.4 C    Last Pain:  Vitals:   11/09/16 1400  TempSrc:   PainSc: 0-No pain                 Montez Hageman

## 2016-11-09 NOTE — Anesthesia Procedure Notes (Signed)
Procedure Name: LMA Insertion Date/Time: 11/09/2016 11:47 AM Performed by: Bufford Spikes Pre-anesthesia Checklist: Patient identified, Emergency Drugs available, Suction available and Patient being monitored Patient Re-evaluated:Patient Re-evaluated prior to inductionOxygen Delivery Method: Circle system utilized Preoxygenation: Pre-oxygenation with 100% oxygen Intubation Type: IV induction Ventilation: Mask ventilation without difficulty LMA: LMA inserted LMA Size: 4.0 Number of attempts: 1 Airway Equipment and Method: Bite block Placement Confirmation: positive ETCO2 Tube secured with: Tape Dental Injury: Teeth and Oropharynx as per pre-operative assessment

## 2016-11-09 NOTE — Discharge Instructions (Addendum)
[facility] °Office Phone Number [phone] ° °BREAST BIOPSY/ PARTIAL MASTECTOMY: POST OP INSTRUCTIONS ° °Always review your discharge instruction sheet given to you by the facility where your surgery was performed. ° °IF YOU HAVE DISABILITY OR FAMILY LEAVE FORMS, YOU MUST BRING THEM TO THE OFFICE FOR PROCESSING.  DO NOT GIVE THEM TO YOUR DOCTOR. ° °1. A prescription for pain medication may be given to you upon discharge.  Take your pain medication as prescribed, if needed.  If narcotic pain medicine is not needed, then you may take acetaminophen (Tylenol) or ibuprofen (Advil) as needed. °2. Take your usually prescribed medications unless otherwise directed °3. If you need a refill on your pain medication, please contact your pharmacy.  They will contact our office to request authorization.  Prescriptions will not be filled after 5pm or on week-ends. °4. You should eat very light the first 24 hours after surgery, such as soup, crackers, pudding, etc.  Resume your normal diet the day after surgery. °5. Most patients will experience some swelling and bruising in the breast.  Ice packs and a good support bra will help.  Swelling and bruising can take several days to resolve.  °6. It is common to experience some constipation if taking pain medication after surgery.  Increasing fluid intake and taking a stool softener will usually help or prevent this problem from occurring.  A mild laxative (Milk of Magnesia or Miralax) should be taken according to package directions if there are no bowel movements after 48 hours. °7. Unless discharge instructions indicate otherwise, you may remove your bandages 48 hours after surgery, and you may shower at that time.  You may have steri-strips (small skin tapes) in place directly over the incision.  These strips should be left on the skin for 7-10 days.   Any sutures or staples will be removed at the office during your follow-up visit. °8. ACTIVITIES:  You may resume  regular daily activities (gradually increasing) beginning the next day.  Wearing a good support bra or sports bra (or the breast binder) minimizes pain and swelling.  You may have sexual intercourse when it is comfortable. °a. You may drive when you no longer are taking prescription pain medication, you can comfortably wear a seatbelt, and you can safely maneuver your car and apply brakes. °b. RETURN TO WORK:  __________1 week_______________ °9. You should see your doctor in the office for a follow-up appointment approximately two weeks after your surgery.  Your doctor’s nurse will typically make your follow-up appointment when she calls you with your pathology report.  Expect your pathology report 2-3 business days after your surgery.  You may call to check if you do not hear from us after three days. ° ° °WHEN TO CALL YOUR DOCTOR: °1. Fever over 101.0 °2. Nausea and/or vomiting. °3. Extreme swelling or bruising. °4. Continued bleeding from incision. °5. Increased pain, redness, or drainage from the incision. ° °The clinic staff is available to answer your questions during regular business hours.  Please don’t hesitate to call and ask to speak to one of the nurses for clinical concerns.  If you have a medical emergency, go to the nearest emergency room or call 911.  A surgeon from [facility] is always on call at the hospital. ° °For further questions, please visit [email or web address]  ° ° °Post Anesthesia Home Care Instructions ° °Activity: °Get plenty of rest for the remainder of the day. A responsible individual must stay with you for 24   hours following the procedure.  °For the next 24 hours, DO NOT: °-Drive a car °-Operate machinery °-Drink alcoholic beverages °-Take any medication unless instructed by your physician °-Make any legal decisions or sign important papers. ° °Meals: °Start with liquid foods such as gelatin or soup. Progress to regular foods as tolerated. Avoid greasy, spicy,  heavy foods. If nausea and/or vomiting occur, drink only clear liquids until the nausea and/or vomiting subsides. Call your physician if vomiting continues. ° °Special Instructions/Symptoms: °Your throat may feel dry or sore from the anesthesia or the breathing tube placed in your throat during surgery. If this causes discomfort, gargle with warm salt water. The discomfort should disappear within 24 hours. ° °If you had a scopolamine patch placed behind your ear for the management of post- operative nausea and/or vomiting: ° °1. The medication in the patch is effective for 72 hours, after which it should be removed.  Wrap patch in a tissue and discard in the trash. Wash hands thoroughly with soap and water. °2. You may remove the patch earlier than 72 hours if you experience unpleasant side effects which may include dry mouth, dizziness or visual disturbances. °3. Avoid touching the patch. Wash your hands with soap and water after contact with the patch. °  ° °

## 2016-11-09 NOTE — Interval H&P Note (Signed)
History and Physical Interval Note:  11/09/2016 11:26 AM  [name]  has presented today for surgery, with the diagnosis of LEFT BREAST CANCER  The various methods of treatment have been discussed with the patient and family. After consideration of risks, benefits and other options for treatment, the patient has consented to  Procedure(s): RE-EXCISION OF BREAST LUMPECTOMY (Left) as a surgical intervention .  The patient's history has been reviewed, patient examined, no change in status, stable for surgery.  I have reviewed the patient's chart and labs.  Questions were answered to the patient's satisfaction.     Halston Kintz

## 2016-11-09 NOTE — Op Note (Signed)
Re-excisional Left Breast Lumpectomy   Indications: This patient presents with history of positive margins after partial mastectomy for left breast cancer   Pre-operative Diagnosis: left breast cancer   Post-operative Diagnosis: left breast cancer   Surgeon: [name]   Assistants: n/a   Anesthesia: General anesthesia and Local anesthesia   ASA Class: 3   Procedure Details  The patient was seen in the Holding Room. The risks, benefits, complications, treatment options, and expected outcomes were discussed with the patient. The possibilities of reaction to medication, pulmonary aspiration, bleeding, infection, the need for additional procedures, failure to diagnose a condition, and creating a complication requiring transfusion or operation were discussed with the patient. The patient concurred with the proposed plan, giving informed consent. The site of surgery properly noted/marked. The patient was taken to Operating Room # 8, identified, and the procedure verified as re-excision of left breast cancer.  After induction of anesthesia, the left breast and chest were prepped and draped in standard fashion.  The lumpectomy was performed by reopening the prior incision. Seroma was aspirated. The mastopexy sutures were removed. Additional margins were taken at the medial, superior, lateral, inferior, and posterior borders of the partial mastectomy cavity. Dissection was carried down to the pectoral fascia. Orientation sutures were placed in the specimens. Hemostasis was achieved with cautery. The wound was irrigated and closed with a 3-0 Vicryl deep dermal interrupted and a 4-0 Monocryl subcuticular closure in layers.  Sterile dressings were applied. At the end of the operation, all sponge, instrument, and needle counts were correct.   Findings:  grossly clear surgical margins, anterior margin is skin, posterior margin is pectoralis  Estimated Blood Loss: Minimal   Drains: none    Specimens: additional superior, lateral, medial, inferior, posterior margins.   Complications: None; patient tolerated the procedure well.   Disposition: PACU - hemodynamically stable.   Condition: stable

## 2016-11-09 NOTE — Transfer of Care (Signed)
Immediate Anesthesia Transfer of Care Note  Patient: Dawn Haynes  Procedure(s) Performed: Procedure(s): RE-EXCISION OF BREAST LUMPECTOMY (Left)  Patient Location: PACU  Anesthesia Type:General  Level of Consciousness: awake and sedated  Airway & Oxygen Therapy: Patient connected to face mask oxygen  Post-op Assessment: Report given to RN  Post vital signs: Reviewed and stable  Last Vitals:  Vitals:   11/09/16 0945  BP: 111/69  Pulse: 63  Resp: 20  Temp: 36.8 C    Last Pain:  Vitals:   11/09/16 0945  TempSrc: Oral      Patients Stated Pain Goal: 7 (48/18/56 3149)  Complications: No apparent anesthesia complications

## 2016-11-10 ENCOUNTER — Encounter (HOSPITAL_BASED_OUTPATIENT_CLINIC_OR_DEPARTMENT_OTHER): Payer: Self-pay | Admitting: General Surgery

## 2016-11-16 ENCOUNTER — Telehealth: Payer: Self-pay | Admitting: General Surgery

## 2016-11-16 NOTE — Telephone Encounter (Signed)
Discussed pathology with the pathologist and Dr. [name].  Also reviewed with the patient.  Given the amount of residual cancer in the specimen, recommend mastectomy.  Will set up appointments with radiation oncology and plastic surgery.

## 2016-11-18 ENCOUNTER — Encounter: Payer: Self-pay | Admitting: Radiation Oncology

## 2016-11-18 ENCOUNTER — Other Ambulatory Visit: Payer: Self-pay | Admitting: General Surgery

## 2016-11-23 NOTE — Progress Notes (Signed)
Location of Breast Cancer: Breast cancer of upper-outer quadrant of left female breast  (mets to liver)  Histology per Pathology Report: 11-09-16 Diagnosis 1. Breast, excision, Left Medial Margin - RESIDUAL INVASIVE DUCTAL CARCINOMA - RESIDUAL HIGH GRADE DUCTAL CARCINOMA IN SITU - CARCINOMA IS PRESENT AT INKED INFERIOR, POSTERIOR AND ANTERIOR MARGINS - FIBROCYSTIC CHANGES - RADIAL SCAR - PREVIOUS SURGICAL SITE CHANGES - SEE COMMENT 2. Breast, excision, Left Superior Margin - RESIDUAL INVASIVE DUCTAL CARCINOMA - RESIDUAL HIGH GRADE DUCTAL CARCINOMA IN SITU - CARCINOMA IS PRESENT AT INKED LATERAL AND MEDIAL MARGINS - PREVIOUS SURGICAL SITE CHANGES 3. Breast, excision, Left Lateral Margin - RESIDUAL INVASIVE DUCTAL CARCINOMA - RESIDUAL HIGH GRADE DUCTAL CARCINOMA IN SITU - MARGINS UNINVOLVED BY CARCINOMA - PREVIOUS SURGICAL SITE CHANGES 4. Breast, excision, Left Inferior Margin - NO RESIDUAL CARCINOMA IDENTIFIED 5. Breast, excision, Left Posterior Margin - RESIDUAL INVASIVE DUCTAL CARCINOMA - RESIDUAL HIGH GRADE DUCTAL CARCINOMA IN SITU - CARCINOMA IS PRESENT AT INKED MEDIAL AND ANTERIOR MARGINS - PREVIOUS SURGICAL SITE CHANGES   Diagnosis: 10-28-16 1. Breast, lumpectomy, Left - INVASIVE AND IN SITU DUCTAL CARCINOMA, 3.5 CM. - INVASIVE CARCINOMA INVOLVES POSTERIOR, LATERAL, INFERIOR, SUPERIOR AND ANTERIOR MARGINS. 2. Breast, lumpectomy, Left Subareolar - INVASIVE AND IN SITU DUCTAL CARCINOMA, 3.3 CM. - INVASIVE CARCINOMA INVOLVES POSTERIOR, LATERAL AND MEDIAL MARGINS. 3. Breast, lumpectomy, Left resection - INVASIVE AND IN SITU DUCTAL CARCINOMA, 2.1 CM. - INVASIVE CARCINOMA FOCALLY INVOLVES THE MEDIAL MARGIN AND IS LESS THAN 0.1 CM FROM SUPERIOR MARGIN BROADLY. 4. Lymph node, sentinel, biopsy, Left axillary #1 - BENIGN ADIPOSE TISSUE. - NO LYMPH NODE TISSUE OR MALIGNANCY. 5. Lymph node, sentinel, biopsy, Left axillary #2 - ONE BENIGN LYMPH NODE (0/1)  Receptor Status:  ER(0% negative), PR (0% negative), Her2-neu (negative ratio 1.27), Ki-(40%)    Diagnosis: 09-16-16 1. Breast, left, needle core biopsy, 1 o'clock - INVASIVE DUCTAL CARCINOMA. - DUCTAL CARCINOMA IN SITU. - SEE COMMENT. 2. Breast, left, needle core biopsy, 2:30 o'clock - INVASIVE DUCTAL CARCINOMA. - DUCTAL CARCINOMA IN SITU  Receptor Status: ER(0% negative), PR (0% negative), Her2-neu (negative ratio 1.27), Ki-(40%)  Did patient present with symptoms (if so, please note symptoms) or was this found on screening mammography?: She felt a lump in the left upper outer quadrant for the past 1 year.  Past/Anticipated interventions by surgeon, if any:11-09-16 Re-excision of left breast lumpectomy, 10-28-16 Left breast lumpectomy, 09-16-16 Left breast biopsy    Past/Anticipated interventions by medical oncology, if any: 09-11-16 Dr. [name]  Continued surveillance with a follow-up CT scan in 4 months,   PET/CT scan 08/20/2016, Palliative chemotherapy: Carboplatin and gemcitabine day 1 and day 8 every 3 weeks ( metastatic breast cancer with extensive liver metastases and malignant recurrent ascites), switched to gemcitabine every 2 weeks  Lymphedema issues, if any: No  Pain issues, if any:  No  SAFETY ISSUES:  Prior radiation?  No  Pacemaker/ICD? No  Possible current pregnancy? No Is the patient on methotrexate? No  Menarche age, G55 P6, BC 1 year,  Current Complaints / other details: Married 36 year old woman, Spina bifida,Breast cancer of upper-outer quadrant of left female breast 2016, malignant ascites,extensive liver metastases, lung cancer paternal grandfather Dr. Loel Lofty. [place] note : 11/20/2016=plan for immediate expander/Flex HD placement at the time of mastectomy,may need flap ,depend on timing of radiation Wt Readings from Last 3 Encounters:  12/01/16 137 lb (62.1 kg)  11/09/16 138 lb 12.8 oz (63 kg)  10/28/16 140 lb (63.5 kg)  BP 114/79  Pulse 70   Temp 98.4 F (36.9  C) (Oral)   Resp 16   Ht '5\' 3"'$  (1.6 m)   Wt 137 lb (62.1 kg)   SpO2 100%   BMI 24.27 kg/m      Georgena Spurling, RN 11/23/2016,4:00 PM

## 2016-11-26 ENCOUNTER — Encounter: Payer: Self-pay | Admitting: Radiation Oncology

## 2016-11-26 NOTE — Progress Notes (Signed)
eror

## 2016-12-01 ENCOUNTER — Encounter: Payer: Self-pay | Admitting: Radiation Oncology

## 2016-12-01 ENCOUNTER — Ambulatory Visit
Admission: RE | Admit: 2016-12-01 | Discharge: 2016-12-01 | Disposition: A | Discharge status: Home / Self Care | Payer: Medicaid Other | Source: Ambulatory Visit | Attending: Radiation Oncology | Admitting: Radiation Oncology

## 2016-12-01 ENCOUNTER — Telehealth: Payer: Self-pay | Admitting: *Deleted

## 2016-12-01 VITALS — BP 114/79 | HR 70 | Temp 98.4°F | Resp 16 | Ht 63.0 in | Wt 137.0 lb

## 2016-12-01 DIAGNOSIS — Z171 Estrogen receptor negative status [ER-]: Secondary | ICD-10-CM | POA: Diagnosis not present

## 2016-12-01 DIAGNOSIS — Z9221 Personal history of antineoplastic chemotherapy: Secondary | ICD-10-CM | POA: Diagnosis not present

## 2016-12-01 DIAGNOSIS — C50412 Malignant neoplasm of upper-outer quadrant of left female breast: Secondary | ICD-10-CM

## 2016-12-01 DIAGNOSIS — Z9012 Acquired absence of left breast and nipple: Secondary | ICD-10-CM | POA: Insufficient documentation

## 2016-12-01 DIAGNOSIS — Z7183 Encounter for nonprocreative genetic counseling: Secondary | ICD-10-CM

## 2016-12-01 NOTE — Telephone Encounter (Signed)
CALLED PATIENT TO INFORM OF APPT. WITH GENETICS PERSON- [name] ON 12-24-16- ARRIVAL TIME - 12:45 PM, SPOKE WITH PATIENT AND SHE IS AWARE OF THIS APPT.

## 2016-12-01 NOTE — Addendum Note (Signed)
Encounter addended by: Malena Edman, RN on: 12/01/2016  3:16 PM<BR>    Actions taken: Charge Capture section accepted

## 2016-12-01 NOTE — Progress Notes (Signed)
Radiation Oncology         [phone] ________________________________  Name: Dawn Haynes MRN: [id number]  Date: 12/01/2016  DOB: 1980/11/15  MW:[id number], Pcp Not In  [name], MD     REFERRING PHYSICIAN: [name], MD   DIAGNOSIS: There were no encounter diagnoses.   HISTORY OF PRESENT ILLNESS: Dawn Haynes is a 36 y.o. female seen at the request of Dr. Barry Dienes and Dr. [name] for a history of metastatic breast cancer. 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 On her diagnostic imaging her tumor was 5.3 x 5.2 x 2.7 cm with multiple nodes concerning for adenopathy. Biopsy confirmed disease in the lymph node as well and her original tumor. She had  staging scans revealing concerns for metastatic disease to the liver and peritoneum. She went on to proceed with neoadjuvant chemotherapy in January 2017 and continued this until October 2017. She had a great overall response systemically seen on her post treatment PET scan that revealed hypermetabolic activity along the breast and scarring in the liver with no active malignancy identified in the abdomen or pelvis. She was doing so well that discussion was had about options for local therapy.  She subsequently underwent left lumpectomy on 10/28/16 which revealed a 3.5 cm invasive and in situ ductal carcinoma. The invasive cancer involved all margins and a separate 3.3 cm tumor as well as a 2.1 cm similar tumor was also noted. Of the 2 sentinel lymph nodes assessed, none contained disease. She went back for re-excision on 10/28/16 revealing residual invasive ductal carcinoma and high grade DCIS along the posterior and anterior margins, lateral and medial margins of the superior excision, and DCIS along the left lateral margin, residual DCIS and IDC was also seen and the medial and anterior margins of the left  posterior resection. The patient had another re-excision on 11/09/16 and this still revealed residual disease at the medial, superior, lateral, inferior, and posterior margins.  The patient saw Dr. Marla Roe of plastic surgery on 11/20/16 and the plan is to proceed with a left mastectomy, placement of an immediate expander  at the time of of mastectomy and she may need a latissimus muscle flap at a later date. The patient presents to radiation oncology with her husband and two children to discuss radiation for the management of her disease.  PREVIOUS RADIATION THERAPY: No   PAST MEDICAL HISTORY:  Past Medical History:  Diagnosis Date  . Back pain    L1-L2 degenerative   . Breast cancer ([place]) 07/09/2015   left breast  . Breast cancer of upper-outer quadrant of left female breast ([place]) 07/11/2015  . History of blood transfusion    no abnormal reaction noted  . Weakness    numbness and tingling in both hands       PAST SURGICAL HISTORY: Past Surgical History:  Procedure Laterality Date  . BREAST BIOPSY Left   . BREAST LUMPECTOMY WITH RADIOACTIVE SEED AND SENTINEL LYMPH NODE BIOPSY Left 10/28/2016   Procedure: LEFT BREAST LUMPECTOMY WITH BRACKETED RADIOACTIVE SEED AND SENTINEL LYMPH NODE BIOPSY;  Surgeon: [name], MD;  Location: [facility];  Service: General;  Laterality: Left;  . CESAREAN SECTION N/A 01/22/2013   Procedure: primary CESAREAN SECTION of baby boy at 2001;  Surgeon: Cheri Fowler, MD;  Location: [facility];  Service: Obstetrics;  Laterality: N/A;  . CHOLECYSTECTOMY  Aug 2000  .  DILATION AND CURETTAGE OF UTERUS    . HERNIA REPAIR    . INDUCED ABORTION    . PORTACATH PLACEMENT Right 07/24/2015   Procedure: INSERTION PORT-A-CATH RIGHT SUBCLAVIAN;  Surgeon: [name], MD;  Location: [facility];  Service: General;  Laterality: Right;  . RE-EXCISION OF BREAST LUMPECTOMY Left 11/09/2016   Procedure: RE-EXCISION OF BREAST LUMPECTOMY;  Surgeon: [name], MD;  Location: [facility];  Service: General;  Laterality: Left;  . tube put in to drain fluid from near liver    . WISDOM TOOTH EXTRACTION       FAMILY HISTORY:  Family History  Problem Relation Age of Onset  . Diabetes Maternal Grandmother   . Lung cancer Paternal Grandfather      SOCIAL HISTORY:  reports that she has never smoked. She has never used smokeless tobacco. She reports that she does not drink alcohol or use drugs. The patient is married and accompanied by her husband and two young children. 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  ALLERGIES: No known allergies   MEDICATIONS:  Current Outpatient Prescriptions  Medication Sig Dispense Refill  . Cholecalciferol (VITAMIN D) 2000 units CAPS Take 2,000 Units by mouth daily.     . vitamin B-12 (CYANOCOBALAMIN) 100 MCG tablet Take 100 mcg by mouth daily.    Marland Kitchen loratadine (CLARITIN) 10 MG tablet Take 10 mg by mouth daily as needed for allergies.    Marland Kitchen oxyCODONE (OXY IR/ROXICODONE) 5 MG immediate release tablet Take 1 tablet (5 mg total) by mouth every 4 (four) hours as needed for moderate pain. (Patient not taking: Reported on 12/01/2016) 30 tablet 0   No current facility-administered medications for this encounter.      REVIEW OF SYSTEMS: On review of systems, the patient reports that she is doing well overall. She denies any chest pain, shortness of breath, cough, fevers, chills, night sweats, unintended weight changes. She denies any bowel or bladder disturbances, and denies abdominal pain, nausea or vomiting. She denies any new musculoskeletal or joint aches or pains. A complete review of systems is obtained and is otherwise negative.     PHYSICAL EXAM:  Wt Readings from Last 3 Encounters:  12/01/16 137 lb (62.1 kg)  11/09/16 138 lb 12.8 oz (63 kg)  10/28/16 140 lb (63.5 kg)   Temp Readings from Last 3 Encounters:  12/01/16 98.4 F (36.9 C) (Oral)  11/09/16 97.6 F (36.4 C)  10/28/16 97.7 F (36.5 C)   BP Readings  from Last 3 Encounters:  12/01/16 114/79  11/09/16 108/68  10/28/16 117/82   Pulse Readings from Last 3 Encounters:  12/01/16 70  11/09/16 73  10/28/16 65   Pain Assessment Pain Score: 0-No pain/10  In general this is a well appearing African American female in no acute distress. She is alert and oriented x4 and appropriate throughout the examination. HEENT reveals that the patient is normocephalic, atraumatic. EOMs are intact. PERRLA. Skin is intact without any evidence of gross lesions. Cardiopulmonary assessment is negative for acute distress and she exhibits normal effort. Breast exam is deferred.     ECOG = 0  0 - Asymptomatic (Fully active, able to carry on all predisease activities without restriction)  1 - Symptomatic but completely ambulatory (Restricted in physically strenuous activity but ambulatory and able to carry out work of a light or sedentary nature. For example, light housework, office work)  2 - Symptomatic, <50% in bed during the day (Ambulatory and capable of all  self care but unable to carry out any work activities. Up and about more than 50% of waking hours)  3 - Symptomatic, >50% in bed, but not bedbound (Capable of only limited self-care, confined to bed or chair 50% or more of waking hours)  4 - Bedbound (Completely disabled. Cannot carry on any self-care. Totally confined to bed or chair)  5 - Death   Eustace Pen MM, Creech RH, Tormey DC, et al. [phone]). "Toxicity and response criteria of the [facility]". [place] Oncol. 5 (6): 649-55    LABORATORY DATA:  Lab Results  Component Value Date   WBC 4.3 10/22/2016   HGB 11.7 (L) 10/22/2016   HCT 35.8 (L) 10/22/2016   MCV 92.3 10/22/2016   PLT 193 10/22/2016   Lab Results  Component Value Date   NA 137 10/22/2016   K 3.9 10/22/2016   CL 103 10/22/2016   CO2 26 10/22/2016   Lab Results  Component Value Date   ALT 24 09/11/2016   AST 26 09/11/2016   ALKPHOS 67 09/11/2016     BILITOT 0.68 09/11/2016      RADIOGRAPHY: No results found.     IMPRESSION/PLAN: 1. 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 The patient has noted a great response to treatment. Dr. Lisbeth Renshaw discusses the pathology findings and reviews the nature of invasive and in situ metastatic disease. The patient will proceed with re-excision surgery due to her positive margins and will undergo left mastectomy and tissue expander placement. We will await the scheduling of her surgery so we can plan to see her 2-3 weeks follow surgery for re-assessment prior to proceeding with radiation. We discussed the risks, benefits, short, and long term effects of radiotherapy, and the patient is interested in proceeding. Dr. Lisbeth Renshaw discusses the delivery and logistics of radiotherapy and recommends a course of 6 1/2 weeks of treatment. She is interested in moving  forward with the radiotherapy recommended as well as the simulation and planning process when appropriate. We anticipate starting radiotherapy about 4 weeks after surgery.  2. Possible genetic predisposition to malignancy. The patient is interested in genetic counseling referral which will be placed.  3. Social work needs. The patient is interested in help with transportation and will be referred to social work.   In a visit lasting 60 minutes, greater than 50% of the time was spent face to face discussing the rationale of radiotherapy, and coordinating the patient's care.   The above documentation reflects my direct findings during this shared patient visit. Please see the separate note by Dr. Lisbeth Renshaw on this date for the remainder of the patient's plan of care.    Carola Rhine, PAC  This document serves as a record of services personally performed by Shona Simpson, PA-C and Kyung Rudd, MD. It was created on their behalf by Darcus Austin, a trained medical scribe. The creation of this record is  based on the scribe's personal observations and the providers' statements to them. This document has been checked and approved by the attending provider.

## 2016-12-09 ENCOUNTER — Telehealth: Payer: Self-pay

## 2016-12-09 NOTE — Telephone Encounter (Signed)
Returned pt vm regarding scheduling a port flush. Also, pt wanted to see when she can have her port removed. Pt will be going for left mastectomy 6/13. Will discuss with Dr.Gudena and update pt with details. Pt verbalized understanding. Confirmed appt for next week's port flush wed 12/16/16 at 1130am.

## 2016-12-14 NOTE — Pre-Procedure Instructions (Signed)
Dawn Haynes  12/14/2016      [facility], [place] [address] [place] [address] Phone: [phone] Fax: [phone]  [id number] [place], [address] [address] Phone: [phone] Fax: [phone]  [facility] [place], [address] [place] [address] Phone: [phone] Fax: [phone]    Your procedure is scheduled on June 13  Report to [facility] at 0700 A.M.  Call this number if you have problems the morning of surgery:  [phone]   Remember:  Do not eat food or drink liquids after midnight.   Take these medicines the morning of surgery with A SIP OF WATER oxyCODONE (OXY IR/ROXICODONE) if needed.   7 days prior to surgery STOP taking any Aspirin, Aleve, Naproxen, Ibuprofen, Motrin, Advil, Goody's, BC's, all herbal medications, fish oil, and all vitamins    Do not wear jewelry, make-up or nail polish.  Do not wear lotions, powders, or perfumes, or deoderant.  Do not shave 48 hours prior to surgery.   Do not bring valuables to the hospital.  [facility] is not responsible for any belongings or valuables.  Contacts, dentures or bridgework may not be worn into surgery.  Leave your suitcase in the car.  After surgery it may be brought to your room.  For patients admitted to the hospital, discharge time will be determined by your treatment team.  Patients discharged the day of surgery will not be allowed to drive home.    Special instructions:   [place]- Preparing For Surgery  Before surgery, you can play an important role. Because skin is not sterile, your skin needs to be as free of germs as possible. You can reduce the number of germs on your skin by washing with  CHG (chlorahexidine gluconate) Soap before surgery.  CHG is an antiseptic cleaner which kills germs and bonds with the skin to continue killing germs even after washing.  Please do not use if you have an allergy to CHG or antibacterial soaps. If your skin becomes reddened/irritated stop using the CHG.  Do not shave (including legs and underarms) for at least 48 hours prior to first CHG shower. It is OK to shave your face.  Please follow these instructions carefully.   1. Shower the NIGHT BEFORE SURGERY and the MORNING OF SURGERY with CHG.   2. If you chose to wash your hair, wash your hair first as usual with your normal shampoo.  3. After you shampoo, rinse your hair and body thoroughly to remove the shampoo.  4. Use CHG as you would any other liquid soap. You can apply CHG directly to the skin and wash gently with a scrungie or a clean washcloth.   5. Apply the CHG Soap to your body ONLY FROM THE NECK DOWN.  Do not use on open wounds or open sores. Avoid contact with your eyes, ears, mouth and genitals (private parts). Wash genitals (private parts) with your normal soap.  6. Wash thoroughly, paying special attention to the area where your surgery will be performed.  7. Thoroughly rinse your body with warm water from the neck down.  8. DO NOT shower/wash with your normal soap after using and rinsing  off the CHG Soap.  9. Pat yourself dry with a CLEAN TOWEL.   10. Wear CLEAN PAJAMAS   11. Place CLEAN SHEETS on your bed the night of your first shower and DO NOT SLEEP WITH PETS.    Day of Surgery: Do not apply any deodorants/lotions. Please wear clean clothes to the hospital/surgery center.      Please read over the following fact sheets that you were given.

## 2016-12-15 ENCOUNTER — Encounter (HOSPITAL_COMMUNITY): Payer: Self-pay

## 2016-12-15 ENCOUNTER — Ambulatory Visit: Payer: Self-pay | Admitting: Plastic Surgery

## 2016-12-15 ENCOUNTER — Encounter (HOSPITAL_COMMUNITY)
Admission: RE | Admit: 2016-12-15 | Discharge: 2016-12-15 | Disposition: A | Discharge status: Home / Self Care | Payer: Medicaid Other | Source: Ambulatory Visit | Attending: General Surgery | Admitting: General Surgery

## 2016-12-15 DIAGNOSIS — Z01812 Encounter for preprocedural laboratory examination: Secondary | ICD-10-CM | POA: Diagnosis not present

## 2016-12-15 DIAGNOSIS — Z171 Estrogen receptor negative status [ER-]: Principal | ICD-10-CM

## 2016-12-15 DIAGNOSIS — C50912 Malignant neoplasm of unspecified site of left female breast: Secondary | ICD-10-CM

## 2016-12-15 LAB — CBC
HCT: 37.5 % (ref 36.0–46.0)
Hemoglobin: 12.2 g/dL (ref 12.0–15.0)
MCH: 30.2 pg (ref 26.0–34.0)
MCHC: 32.5 g/dL (ref 30.0–36.0)
MCV: 92.8 fL (ref 78.0–100.0)
PLATELETS: 180 10*3/uL (ref 150–400)
RBC: 4.04 MIL/uL (ref 3.87–5.11)
RDW: 12.5 % (ref 11.5–15.5)
WBC: 3.6 10*3/uL — ABNORMAL LOW (ref 4.0–10.5)

## 2016-12-15 LAB — BASIC METABOLIC PANEL
Anion gap: 7 (ref 5–15)
BUN: 9 mg/dL (ref 6–20)
CHLORIDE: 106 mmol/L (ref 101–111)
CO2: 25 mmol/L (ref 22–32)
CREATININE: 0.91 mg/dL (ref 0.44–1.00)
Calcium: 9.2 mg/dL (ref 8.9–10.3)
GFR calc non Af Amer: >60 mL/min (ref >60)
GLUCOSE: 78 mg/dL (ref 65–99)
Potassium: 4.1 mmol/L (ref 3.5–5.1)
Sodium: 138 mmol/L (ref 135–145)

## 2016-12-15 LAB — HCG, SERUM, QUALITATIVE: Preg, Serum: NEGATIVE

## 2016-12-15 NOTE — Progress Notes (Signed)
PCP - Health and Human services in [facility] - could not remember the doctors name she gave me this number for them [phone] Cardiologist - denies  Chest x-ray - not needed EKG - requesting from PCP Stress Test - denies ECHO - denies Cardiac Cath - denies    Patient denies shortness of breath, fever, cough and chest pain at PAT appointment   Patient verbalized understanding of instructions that were given to them at the PAT appointment. Patient was also instructed that they will need to review over the PAT instructions again at home before surgery.

## 2016-12-15 NOTE — H&P (Signed)
Dawn Haynes is an 36 y.o. female.   Chief Complaint: left breast Cancer HPI: The patient is a 35 y.o. yrs old bf here with her for breast reconstruction and mastectomy.  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  She underwent chemotherapy (carboplatin, gemcitabine).  She had a positive response with no palpable breast mass and resolution of the liver by PET scan.  The ascites improved and the peritoneal catheter was removed.  CT showed posterior right lobe of live with lesion.  She has 6 children (3, 6, 11, 18 and 19 yrs old).  She underwent a lumpectomy but has positive margins.  She has decided on a LEFT mastectomy.  She is 5 feet 3 inches tall, weight is 137 pounds.  Preop bra = 36 B.  Past Medical History:  Diagnosis Date  . Back pain    L1-L2 degenerative   . Breast cancer ([facility]) 07/09/2015   left breast  . Breast cancer of upper-outer quadrant of left female breast ([facility]) 07/11/2015  . History of blood transfusion    no abnormal reaction noted  . Weakness    numbness and tingling in both hands    Past Surgical History:  Procedure Laterality Date  . BREAST BIOPSY Left   . BREAST LUMPECTOMY WITH RADIOACTIVE SEED AND SENTINEL LYMPH NODE BIOPSY Left 10/28/2016   Procedure: LEFT BREAST LUMPECTOMY WITH BRACKETED RADIOACTIVE SEED AND SENTINEL LYMPH NODE BIOPSY;  Surgeon: Faera Byerly, MD;  Location: [facility];  Service: General;  Laterality: Left;  . CESAREAN SECTION N/A 01/22/2013   Procedure: primary CESAREAN SECTION of baby boy at 2001;  Surgeon: Todd Meisinger, MD;  Location: [facility];  Service: Obstetrics;  Laterality: N/A;  . CHOLECYSTECTOMY  Aug 2000  . DILATION AND CURETTAGE OF UTERUS    . HERNIA REPAIR    . INDUCED ABORTION    . PORTACATH PLACEMENT Right 07/24/2015   Procedure: INSERTION PORT-A-CATH RIGHT SUBCLAVIAN;  Surgeon: Haywood Ingram, MD;  Location: [facility];  Service: General;  Laterality: Right;  .  RE-EXCISION OF BREAST LUMPECTOMY Left 11/09/2016   Procedure: RE-EXCISION OF BREAST LUMPECTOMY;  Surgeon: Faera Byerly, MD;  Location: [facility];  Service: General;  Laterality: Left;  . tube put in to drain fluid from near liver    . WISDOM TOOTH EXTRACTION      Family History  Problem Relation Age of Onset  . Diabetes Maternal Grandmother   . Lung cancer Paternal Grandfather    Social History:  reports that she has never smoked. She has never used smokeless tobacco. She reports that she drinks alcohol. She reports that she does not use drugs.  Allergies:  Allergies  Allergen Reactions  . No Known Allergies      (Not in a hospital admission)  Results for orders placed or performed during the hospital encounter of 12/15/16 (from the past 48 hour(s))  Basic metabolic panel     Status: None   Collection Time: 12/15/16  8:22 AM  Result Value Ref Range   Sodium 138 135 - 145 mmol/L   Potassium 4.1 3.5 - 5.1 mmol/L   Chloride 106 101 - 111 mmol/L   CO2 25 22 - 32 mmol/L   Glucose, Bld 78 65 - 99 mg/dL   BUN 9 6 - 20 mg/dL   Creatinine, Ser 0.91 0.44 - 1.00 mg/dL   Calcium 9.2 8.9 - 10.3 mg/dL   GFR calc non Af Amer >60 >  60 mL/min   GFR calc Af Amer >60 >60 mL/min    Comment: (NOTE) The eGFR has been calculated using the CKD EPI equation. This calculation has not been validated in all clinical situations. eGFR's persistently <60 mL/min signify possible Chronic Kidney Disease.    Anion gap 7 5 - 15  CBC     Status: Abnormal   Collection Time: 12/15/16  8:22 AM  Result Value Ref Range   WBC 3.6 (L) 4.0 - 10.5 K/uL   RBC 4.04 3.87 - 5.11 MIL/uL   Hemoglobin 12.2 12.0 - 15.0 g/dL   HCT 37.5 36.0 - 46.0 %   MCV 92.8 78.0 - 100.0 fL   MCH 30.2 26.0 - 34.0 pg   MCHC 32.5 30.0 - 36.0 g/dL   RDW 12.5 11.5 - 15.5 %   Platelets 180 150 - 400 K/uL  hCG, serum, qualitative     Status: None   Collection Time: 12/15/16  8:22 AM  Result Value Ref Range   Preg, Serum  NEGATIVE NEGATIVE    Comment:        THE SENSITIVITY OF THIS METHODOLOGY IS >10 mIU/mL.    No results found.  Review of Systems  Constitutional: Negative.   HENT: Negative.   Eyes: Negative.   Respiratory: Negative.   Cardiovascular: Negative.   Gastrointestinal: Negative.   Genitourinary: Negative.   Musculoskeletal: Negative.   Skin: Negative.   Neurological: Negative.   Endo/Heme/Allergies: Negative.   Psychiatric/Behavioral: Negative.     Last menstrual period 11/20/2016. Physical Exam  Constitutional: She is oriented to person, place, and time. She appears well-developed and well-nourished.  HENT:  Head: Normocephalic and atraumatic.  Eyes: EOM are normal. Pupils are equal, round, and reactive to light.  Cardiovascular: Normal rate.   Respiratory: Effort normal. No respiratory distress.  GI: Soft. She exhibits no distension.  Musculoskeletal: Normal range of motion.  Neurological: She is alert and oriented to person, place, and time.  Skin: Skin is warm. No erythema.  Psychiatric: She has a normal mood and affect. Her behavior is normal. Judgment and thought content normal.     Assessment/Plan We spent extra time talking about the effects of radiation and the plan for use of the Latissimus muscle flap if needed. Plan for left immediate expander / Flex HD placement at the time of the mastectomy.  May need flap.  It will depend on the timing of the radiation  CLAIRE S DILLINGHAM, DO 12/15/2016, 2:36 PM   

## 2016-12-16 ENCOUNTER — Ambulatory Visit (HOSPITAL_BASED_OUTPATIENT_CLINIC_OR_DEPARTMENT_OTHER): Payer: Medicaid Other

## 2016-12-16 DIAGNOSIS — Z452 Encounter for adjustment and management of vascular access device: Secondary | ICD-10-CM | POA: Diagnosis not present

## 2016-12-16 DIAGNOSIS — C787 Secondary malignant neoplasm of liver and intrahepatic bile duct: Secondary | ICD-10-CM

## 2016-12-16 DIAGNOSIS — Z171 Estrogen receptor negative status [ER-]: Secondary | ICD-10-CM

## 2016-12-16 DIAGNOSIS — C50412 Malignant neoplasm of upper-outer quadrant of left female breast: Secondary | ICD-10-CM

## 2016-12-16 DIAGNOSIS — Z95828 Presence of other vascular implants and grafts: Secondary | ICD-10-CM

## 2016-12-16 MED ORDER — SODIUM CHLORIDE 0.9 % IJ SOLN
10.0000 mL | INTRAMUSCULAR | Status: DC | PRN
  Administered 2016-12-16: 10 mL via INTRAVENOUS
  Filled 2016-12-16: qty 10

## 2016-12-16 MED ORDER — HEPARIN SOD (PORK) LOCK FLUSH 100 UNIT/ML IV SOLN
500.0000 [IU] | Freq: Once | INTRAVENOUS | Status: AC | PRN
  Administered 2016-12-16: 500 [IU] via INTRAVENOUS
  Filled 2016-12-16: qty 5

## 2016-12-17 ENCOUNTER — Ambulatory Visit: Payer: Self-pay | Admitting: Plastic Surgery

## 2016-12-21 NOTE — H&P (Signed)
Dawn Haynes  Location: [facility] Patient #: [id number] DOB: 09/15/1980 Undefined / Language: [name] / Race: Black or African American Female   History of Present Illness The patient is a 36 year old female who presents for a follow-up for Liver mass. Pt is a very lovely 36 yo female referred by Dr. [name] for breast cancer metastases to the liver. 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 This was a T3N1M1 left breast cancer with bilobar liver metastases. She was placed on palliative chemotherapy in January 2017. She received carboplatin and gemcitabine. This was switched to straight gemcitabine. 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 She had a dramatic response to chemotherapy. She has not had repeat breast imaging, but breast mass is no longer palpable. She had resolution of ascites and had her peritoneal catheter removed. CT imaging demonstrates that the only residual liver metastasis visible is a confluent area in the posterior right lobe. She feels very well. She has 6 children including a 65.71 year old boy at home. Her energy has rebounded. She last received chemotherapy in November 2017. After I saw her at her last visit, we did a PET scan to evaluate the activity in the liver. The liver was negative for residual malignancy. However, she did have some recurrent mild asymmetric hypermature metabolism and her left breast in the location of her previous breast cancer. Bilateral diagnostic mammogram was performed as well as ultrasound which confirmed this. There were 2 areas which were biopsied and were both positive for triple negative breast cancer. These were at 1:00 and at 2:30.  Breast MRI showed another area and this was also cancer. We tried to do a lumpectomy and reexcision, but still significant cancer was left behind. She is here to discuss mastectomy. She denies pain, fever, chills.   PET  08/20/16 IMPRESSION: 1. There is mildly asymmetric hypermetabolic activity along the lateral glandular tissues of the breast, some of this could be postoperative but recurrence is not excluded and this may prompt breast imaging workup. 2. There is some scarring in the liver but no active malignancy is identified in the abdomen or pelvis. 3. Mildly prominent retroverted uterus with a small adnexal cystic lesion on the right. 4. Low-grade anterior mediastinal metabolic activity is thought to be due to benign thymic tissue.  dx mammo 09/15/16 IMPRESSION: 1. New suspicious calcifications in the outer left breast spanning a distance of approximately 4.1 cm.  2. Suspicious left breast masses, one of which appears to contain calcifications.  3. No definite residual mass seen at site of biopsied proven malignancy.  4. No mammographic evidence of malignancy in the right breast.  pathology 09/16/16 Diagnosis 1. Breast, left, needle core biopsy, 1 o'clock - INVASIVE DUCTAL CARCINOMA. - DUCTAL CARCINOMA IN SITU. - SEE COMMENT. 2. Breast, left, needle core biopsy, 2:30 o'clock - INVASIVE DUCTAL CARCINOMA. - DUCTAL CARCINOMA IN SITU. The carcinoma in parts 1 and 2 is grade 3 and has morphologically similar characteristics.    Allergies No Known Drug Allergies 08/03/2016  Medication History Vitamin D (2000UNIT Capsule, Oral) Active. Soluble Fiber Therapy (Oral) Active. Vitamin B-12 (100MCG Tablet, Oral) Active. Medications Reconciled    Review of Systems All other systems negative  Vitals  Weight: 137.38 lb Height: 62in Body Surface Area: 1.63 m Body Mass Index: 25.13 kg/m  Pulse: 70 (Regular)  BP: 110/80 (Sitting, Left Arm, Standard)       Physical Exam  Breast Note: no erythema. Some small  amt of breast lymphedema. minimal swelling.     Assessment & Plan CARCINOMA OF LEFT BREAST METASTATIC TO LIVER (C50.912) Impression: Pt to see plastic surgery  (Dr. Marla Roe Friday 5/11 at 8:15 AM).  She has appt with radiation.  Will plan mastectomy +/- reconstruction. Discussed risks with patient. I wrote orders and posting sheet. Current Plans Pt Education - [facility] Mastectomy HCI   Signed by [name], MD

## 2016-12-23 ENCOUNTER — Ambulatory Visit (HOSPITAL_COMMUNITY): Payer: Medicaid Other | Admitting: Anesthesiology

## 2016-12-23 ENCOUNTER — Encounter (HOSPITAL_COMMUNITY): Payer: Self-pay | Admitting: Plastic Surgery

## 2016-12-23 ENCOUNTER — Observation Stay (HOSPITAL_COMMUNITY)
Admission: RE | Admit: 2016-12-23 | Discharge: 2016-12-24 | Disposition: A | Discharge status: Home / Self Care | Payer: Medicaid Other | Source: Ambulatory Visit | Attending: Plastic Surgery | Admitting: Plastic Surgery

## 2016-12-23 ENCOUNTER — Encounter (HOSPITAL_COMMUNITY)
Admission: RE | Disposition: A | Discharge status: Home / Self Care | Payer: Self-pay | Source: Ambulatory Visit | Attending: Plastic Surgery

## 2016-12-23 DIAGNOSIS — Z801 Family history of malignant neoplasm of trachea, bronchus and lung: Secondary | ICD-10-CM | POA: Diagnosis not present

## 2016-12-23 DIAGNOSIS — N6321 Unspecified lump in the left breast, upper outer quadrant: Secondary | ICD-10-CM | POA: Insufficient documentation

## 2016-12-23 DIAGNOSIS — Z9012 Acquired absence of left breast and nipple: Secondary | ICD-10-CM | POA: Diagnosis not present

## 2016-12-23 DIAGNOSIS — Z853 Personal history of malignant neoplasm of breast: Secondary | ICD-10-CM | POA: Insufficient documentation

## 2016-12-23 DIAGNOSIS — C50912 Malignant neoplasm of unspecified site of left female breast: Secondary | ICD-10-CM

## 2016-12-23 DIAGNOSIS — C787 Secondary malignant neoplasm of liver and intrahepatic bile duct: Secondary | ICD-10-CM | POA: Insufficient documentation

## 2016-12-23 DIAGNOSIS — C50412 Malignant neoplasm of upper-outer quadrant of left female breast: Principal | ICD-10-CM

## 2016-12-23 DIAGNOSIS — Z171 Estrogen receptor negative status [ER-]: Secondary | ICD-10-CM | POA: Diagnosis not present

## 2016-12-23 DIAGNOSIS — Z833 Family history of diabetes mellitus: Secondary | ICD-10-CM | POA: Diagnosis not present

## 2016-12-23 DIAGNOSIS — Z9221 Personal history of antineoplastic chemotherapy: Secondary | ICD-10-CM | POA: Insufficient documentation

## 2016-12-23 DIAGNOSIS — Z9049 Acquired absence of other specified parts of digestive tract: Secondary | ICD-10-CM | POA: Insufficient documentation

## 2016-12-23 DIAGNOSIS — C50919 Malignant neoplasm of unspecified site of unspecified female breast: Secondary | ICD-10-CM | POA: Diagnosis present

## 2016-12-23 HISTORY — PX: BREAST RECONSTRUCTION WITH PLACEMENT OF TISSUE EXPANDER AND FLEX HD (ACELLULAR HYDRATED DERMIS): SHX6295

## 2016-12-23 HISTORY — PX: TOTAL MASTECTOMY: SHX6129

## 2016-12-23 HISTORY — PX: MASTECTOMY: SHX3

## 2016-12-23 SURGERY — MASTECTOMY, SIMPLE
Anesthesia: General | Site: Breast | Laterality: Left

## 2016-12-23 MED ORDER — DIAZEPAM 2 MG PO TABS
2.0000 mg | ORAL_TABLET | Freq: Two times a day (BID) | ORAL | Status: DC
  Filled 2016-12-23: qty 1

## 2016-12-23 MED ORDER — CHLORHEXIDINE GLUCONATE CLOTH 2 % EX PADS: 6.0000 | MEDICATED_PAD | Freq: Once | CUTANEOUS | Status: DC

## 2016-12-23 MED ORDER — ONDANSETRON HCL 4 MG/2ML IJ SOLN: 4.0000 mg | Freq: Four times a day (QID) | INTRAMUSCULAR | Status: DC | PRN

## 2016-12-23 MED ORDER — CEFAZOLIN SODIUM-DEXTROSE 2-4 GM/100ML-% IV SOLN
2.0000 g | Freq: Three times a day (TID) | INTRAVENOUS | Status: DC
Start: 2016-12-23 — End: 2016-12-23

## 2016-12-23 MED ORDER — MEPERIDINE HCL 25 MG/ML IJ SOLN
12.5000 mg | Freq: Once | INTRAMUSCULAR | Status: AC
  Administered 2016-12-23: 12.5 mg via INTRAVENOUS

## 2016-12-23 MED ORDER — DEXAMETHASONE SODIUM PHOSPHATE 10 MG/ML IJ SOLN
INTRAMUSCULAR | Status: AC
  Filled 2016-12-23: qty 1

## 2016-12-23 MED ORDER — BUPIVACAINE-EPINEPHRINE (PF) 0.25% -1:200000 IJ SOLN
INTRAMUSCULAR | Status: AC
  Filled 2016-12-23: qty 60

## 2016-12-23 MED ORDER — HYDROMORPHONE HCL 1 MG/ML IJ SOLN: 1.0000 mg | INTRAMUSCULAR | Status: DC | PRN

## 2016-12-23 MED ORDER — SUGAMMADEX SODIUM 200 MG/2ML IV SOLN
INTRAVENOUS | Status: AC
  Filled 2016-12-23: qty 2

## 2016-12-23 MED ORDER — FENTANYL CITRATE (PF) 100 MCG/2ML IJ SOLN
INTRAMUSCULAR | Status: DC | PRN
  Administered 2016-12-23 (×2): 100 ug via INTRAVENOUS

## 2016-12-23 MED ORDER — MIDAZOLAM HCL 2 MG/2ML IJ SOLN
INTRAMUSCULAR | Status: AC
  Filled 2016-12-23: qty 2

## 2016-12-23 MED ORDER — CEFAZOLIN SODIUM-DEXTROSE 2-4 GM/100ML-% IV SOLN
2.0000 g | INTRAVENOUS | Status: DC
  Filled 2016-12-23: qty 100

## 2016-12-23 MED ORDER — PROPOFOL 10 MG/ML IV BOLUS
INTRAVENOUS | Status: DC | PRN
Start: 2016-12-23 — End: 2016-12-23
  Administered 2016-12-23: 150 mg via INTRAVENOUS

## 2016-12-23 MED ORDER — PROMETHAZINE HCL 25 MG/ML IJ SOLN: 6.2500 mg | INTRAMUSCULAR | Status: DC | PRN

## 2016-12-23 MED ORDER — LIDOCAINE 2% (20 MG/ML) 5 ML SYRINGE
INTRAMUSCULAR | Status: DC | PRN
Start: 2016-12-23 — End: 2016-12-23
  Administered 2016-12-23: 60 mg via INTRAVENOUS

## 2016-12-23 MED ORDER — SODIUM CHLORIDE 0.9 % IR SOLN
Status: DC | PRN
  Administered 2016-12-23: 500 mL

## 2016-12-23 MED ORDER — MIDAZOLAM HCL 2 MG/2ML IJ SOLN
INTRAMUSCULAR | Status: DC | PRN
  Administered 2016-12-23: 2 mg via INTRAVENOUS

## 2016-12-23 MED ORDER — HYDROMORPHONE HCL 1 MG/ML IJ SOLN: 0.2500 mg | INTRAMUSCULAR | Status: DC | PRN

## 2016-12-23 MED ORDER — ONDANSETRON 4 MG PO TBDP: 4.0000 mg | ORAL_TABLET | Freq: Four times a day (QID) | ORAL | Status: DC | PRN

## 2016-12-23 MED ORDER — 0.9 % SODIUM CHLORIDE (POUR BTL) OPTIME
TOPICAL | Status: DC | PRN
  Administered 2016-12-23: 1000 mL

## 2016-12-23 MED ORDER — MEPERIDINE HCL 25 MG/ML IJ SOLN
INTRAMUSCULAR | Status: AC
  Administered 2016-12-23: 12.5 mg via INTRAVENOUS
  Filled 2016-12-23: qty 1

## 2016-12-23 MED ORDER — PROPOFOL 10 MG/ML IV BOLUS
INTRAVENOUS | Status: AC
  Filled 2016-12-23: qty 20

## 2016-12-23 MED ORDER — NAPROXEN 250 MG PO TABS
500.0000 mg | ORAL_TABLET | Freq: Two times a day (BID) | ORAL | Status: DC | PRN
  Administered 2016-12-23: 500 mg via ORAL
  Filled 2016-12-23: qty 2

## 2016-12-23 MED ORDER — DIPHENHYDRAMINE HCL 50 MG/ML IJ SOLN: 12.5000 mg | Freq: Four times a day (QID) | INTRAMUSCULAR | Status: DC | PRN

## 2016-12-23 MED ORDER — FENTANYL CITRATE (PF) 250 MCG/5ML IJ SOLN
INTRAMUSCULAR | Status: AC
  Filled 2016-12-23: qty 5

## 2016-12-23 MED ORDER — CEFAZOLIN SODIUM-DEXTROSE 2-4 GM/100ML-% IV SOLN
2.0000 g | INTRAVENOUS | Status: AC
  Administered 2016-12-23: 2 g via INTRAVENOUS

## 2016-12-23 MED ORDER — DEXAMETHASONE SODIUM PHOSPHATE 10 MG/ML IJ SOLN
INTRAMUSCULAR | Status: DC | PRN
  Administered 2016-12-23: 10 mg via INTRAVENOUS

## 2016-12-23 MED ORDER — POLYETHYLENE GLYCOL 3350 17 G PO PACK: 17.0000 g | PACK | Freq: Every day | ORAL | Status: DC | PRN

## 2016-12-23 MED ORDER — FENTANYL CITRATE (PF) 100 MCG/2ML IJ SOLN
INTRAMUSCULAR | Status: AC
  Filled 2016-12-23: qty 2

## 2016-12-23 MED ORDER — ACETAMINOPHEN 650 MG RE SUPP: 650.0000 mg | Freq: Four times a day (QID) | RECTAL | Status: DC | PRN

## 2016-12-23 MED ORDER — ONDANSETRON HCL 4 MG/2ML IJ SOLN
INTRAMUSCULAR | Status: DC | PRN
  Administered 2016-12-23: 4 mg via INTRAVENOUS

## 2016-12-23 MED ORDER — ACETAMINOPHEN 325 MG PO TABS: 650.0000 mg | ORAL_TABLET | Freq: Four times a day (QID) | ORAL | Status: DC | PRN

## 2016-12-23 MED ORDER — SENNA 8.6 MG PO TABS
1.0000 | ORAL_TABLET | Freq: Two times a day (BID) | ORAL | Status: DC
  Administered 2016-12-23: 8.6 mg via ORAL
  Filled 2016-12-23: qty 1

## 2016-12-23 MED ORDER — ACETAMINOPHEN 500 MG PO TABS: 1000.0000 mg | ORAL_TABLET | Freq: Four times a day (QID) | ORAL | Status: DC

## 2016-12-23 MED ORDER — SUGAMMADEX SODIUM 200 MG/2ML IV SOLN
INTRAVENOUS | Status: DC | PRN
Start: 2016-12-23 — End: 2016-12-23
  Administered 2016-12-23: 125 mg via INTRAVENOUS

## 2016-12-23 MED ORDER — BUPIVACAINE-EPINEPHRINE (PF) 0.5% -1:200000 IJ SOLN
INTRAMUSCULAR | Status: DC | PRN
  Administered 2016-12-23: 25 mL

## 2016-12-23 MED ORDER — ONDANSETRON HCL 4 MG/2ML IJ SOLN
INTRAMUSCULAR | Status: AC
  Filled 2016-12-23: qty 2

## 2016-12-23 MED ORDER — ROCURONIUM BROMIDE 100 MG/10ML IV SOLN
INTRAVENOUS | Status: DC | PRN
  Administered 2016-12-23: 40 mg via INTRAVENOUS
  Administered 2016-12-23: 10 mg via INTRAVENOUS
  Administered 2016-12-23: 20 mg via INTRAVENOUS

## 2016-12-23 MED ORDER — DIPHENHYDRAMINE HCL 12.5 MG/5ML PO ELIX: 12.5000 mg | ORAL_SOLUTION | Freq: Four times a day (QID) | ORAL | Status: DC | PRN

## 2016-12-23 MED ORDER — LACTATED RINGERS IV SOLN
INTRAVENOUS | Status: DC
  Administered 2016-12-23 (×3): via INTRAVENOUS

## 2016-12-23 SURGICAL SUPPLY — 72 items
ATCH SMKEVC FLXB CAUT HNDSWH (FILTER) ×2 IMPLANT
BAG DECANTER FOR FLEXI CONT (MISCELLANEOUS) ×4 IMPLANT
BINDER BREAST LRG (GAUZE/BANDAGES/DRESSINGS) ×4 IMPLANT
BINDER BREAST XLRG (GAUZE/BANDAGES/DRESSINGS) IMPLANT
BIOPATCH RED 1 DISK 7.0 (GAUZE/BANDAGES/DRESSINGS) ×3 IMPLANT
BIOPATCH RED 1IN DISK 7.0MM (GAUZE/BANDAGES/DRESSINGS) ×1
CANISTER SUCT 3000ML PPV (MISCELLANEOUS) ×4 IMPLANT
CHLORAPREP W/TINT 26ML (MISCELLANEOUS) ×8 IMPLANT
CLOSURE WOUND 1/2 X4 (GAUZE/BANDAGES/DRESSINGS)
COVER MAYO STAND STRL (DRAPES) ×4 IMPLANT
COVER SURGICAL LIGHT HANDLE (MISCELLANEOUS) ×8 IMPLANT
DERMABOND ADVANCED (GAUZE/BANDAGES/DRESSINGS) ×4
DERMABOND ADVANCED .7 DNX12 (GAUZE/BANDAGES/DRESSINGS) ×4 IMPLANT
DRAIN CHANNEL 19F RND (DRAIN) ×4 IMPLANT
DRAPE CHEST BREAST 15X10 FENES (DRAPES) ×4 IMPLANT
DRAPE HALF SHEET 40X57 (DRAPES) ×16 IMPLANT
DRAPE ORTHO SPLIT 77X108 STRL (DRAPES) ×4
DRAPE SURG 17X23 STRL (DRAPES) ×16 IMPLANT
DRAPE SURG ORHT 6 SPLT 77X108 (DRAPES) ×4 IMPLANT
DRAPE UTILITY XL STRL (DRAPES) IMPLANT
DRAPE WARM FLUID 44X44 (DRAPE) ×4 IMPLANT
DRSG PAD ABDOMINAL 8X10 ST (GAUZE/BANDAGES/DRESSINGS) ×4 IMPLANT
ELECT BLADE 4.0 EZ CLEAN MEGAD (MISCELLANEOUS) ×8
ELECT CAUTERY BLADE 6.4 (BLADE) ×4 IMPLANT
ELECT REM PT RETURN 9FT ADLT (ELECTROSURGICAL) ×4
ELECTRODE BLDE 4.0 EZ CLN MEGD (MISCELLANEOUS) ×4 IMPLANT
ELECTRODE REM PT RTRN 9FT ADLT (ELECTROSURGICAL) ×2 IMPLANT
EVACUATOR SILICONE 100CC (DRAIN) ×4 IMPLANT
EVACUATOR SMOKE ACCUVAC VALLEY (FILTER) ×2
EXPANDER BREAST ARTOURA 225CC (Breast) ×4 IMPLANT
GAUZE SPONGE 4X4 12PLY STRL (GAUZE/BANDAGES/DRESSINGS) ×4 IMPLANT
GLOVE BIO SURGEON STRL SZ 6 (GLOVE) ×4 IMPLANT
GLOVE BIO SURGEON STRL SZ 6.5 (GLOVE) ×6 IMPLANT
GLOVE BIO SURGEONS STRL SZ 6.5 (GLOVE) ×2
GLOVE BIOGEL PI IND STRL 6.5 (GLOVE) ×8 IMPLANT
GLOVE BIOGEL PI INDICATOR 6.5 (GLOVE) ×8
GOWN STRL REUS W/ TWL LRG LVL3 (GOWN DISPOSABLE) ×4 IMPLANT
GOWN STRL REUS W/TWL 2XL LVL3 (GOWN DISPOSABLE) ×4 IMPLANT
GOWN STRL REUS W/TWL LRG LVL3 (GOWN DISPOSABLE) ×4
GRAFT FLEX HD 4X16 THICK (Tissue Mesh) ×4 IMPLANT
ILLUMINATOR WAVEGUIDE N/F (MISCELLANEOUS) IMPLANT
KIT BASIN OR (CUSTOM PROCEDURE TRAY) ×8 IMPLANT
KIT ROOM TURNOVER OR (KITS) ×4 IMPLANT
LIGHT WAVEGUIDE WIDE FLAT (MISCELLANEOUS) ×4 IMPLANT
NEEDLE INFUSION SET 21GA (NEEDLE) ×4 IMPLANT
NEEDLE SPNL 18GX3.5 QUINCKE PK (NEEDLE) ×4 IMPLANT
NS IRRIG 1000ML POUR BTL (IV SOLUTION) ×4 IMPLANT
PACK GENERAL/GYN (CUSTOM PROCEDURE TRAY) ×4 IMPLANT
PAD ABD 8X10 STRL (GAUZE/BANDAGES/DRESSINGS) IMPLANT
PAD ARMBOARD 7.5X6 YLW CONV (MISCELLANEOUS) ×8 IMPLANT
PENCIL BUTTON HOLSTER BLD 10FT (ELECTRODE) ×8 IMPLANT
PIN SAFETY STERILE (MISCELLANEOUS) ×4 IMPLANT
PREFILTER EVAC NS 1 1/3-3/8IN (MISCELLANEOUS) ×4 IMPLANT
PREFILTER SMOKE EVAC (FILTER) ×4 IMPLANT
SET ASEPTIC TRANSFER (MISCELLANEOUS) ×4 IMPLANT
STRIP CLOSURE SKIN 1/2X4 (GAUZE/BANDAGES/DRESSINGS) IMPLANT
SUT ETHILON 2 0 FS 18 (SUTURE) ×4 IMPLANT
SUT MNCRL AB 4-0 PS2 18 (SUTURE) ×8 IMPLANT
SUT MON AB 3-0 SH 27 (SUTURE) ×2
SUT MON AB 3-0 SH27 (SUTURE) ×2 IMPLANT
SUT MON AB 4-0 PC3 18 (SUTURE) ×4 IMPLANT
SUT MON AB 5-0 PS2 18 (SUTURE) ×8 IMPLANT
SUT PDS AB 2-0 CT1 27 (SUTURE) ×8 IMPLANT
SUT SILK 4 0 PS 2 (SUTURE) ×4 IMPLANT
SUT VIC AB 3-0 SH 18 (SUTURE) IMPLANT
SUT VIC AB 3-0 SH 8-18 (SUTURE) ×4 IMPLANT
SYR 50ML LL SCALE MARK (SYRINGE) ×4 IMPLANT
SYR BULB IRRIGATION 50ML (SYRINGE) ×4 IMPLANT
TOWEL OR 17X26 10 PK STRL BLUE (TOWEL DISPOSABLE) ×4 IMPLANT
TUBE CONNECTING 12'X1/4 (SUCTIONS) ×1
TUBE CONNECTING 12X1/4 (SUCTIONS) ×3 IMPLANT
YANKAUER SUCT BULB TIP NO VENT (SUCTIONS) ×4 IMPLANT

## 2016-12-23 NOTE — Transfer of Care (Signed)
Immediate Anesthesia Transfer of Care Note  Patient: Dawn Haynes  Procedure(s) Performed: Procedure(s): LEFT MASTECTOMY (Left) IMMEDIATE LEFT BREAST RECONSTRUCTION WITH PLACEMENT OF TISSUE EXPANDER AND FLEX HD (ACELLULAR HYDRATED DERMIS) (Left)  Patient Location: PACU  Anesthesia Type:General  Level of Consciousness: drowsy and patient cooperative  Airway & Oxygen Therapy: Patient Spontanous Breathing and Patient connected to nasal cannula oxygen  Post-op Assessment: Report given to RN, Post -op Vital signs reviewed and stable and Patient moving all extremities  Post vital signs: Reviewed and stable  Last Vitals:  Vitals:   12/23/16 0724  BP: 110/72  Pulse: 65  Resp: 18  Temp: 36.9 C    Last Pain:  Vitals:   12/23/16 0724  TempSrc: Oral         Complications: No apparent anesthesia complications

## 2016-12-23 NOTE — Op Note (Addendum)
Op report    DATE OF OPERATION:  12/23/2016  LOCATION: [facility] Operating room Inpatient  SURGICAL DIVISION: Plastic Surgery  PREOPERATIVE DIAGNOSES:  1. Left Breast cancer.    POSTOPERATIVE DIAGNOSES:  1. Left Breast cancer.   PROCEDURE:  1. Left immediate breast reconstruction with placement of Acellular Dermal Matrix and tissue expanders.  SURGEON: Abdulwahab Demelo Sanger Gerda Yin, DO  ASSISTANT: Shawn Rayburn, PA  ANESTHESIA:  General.   COMPLICATIONS: None.   IMPLANTS: Left - Mentor  Ref #: [id number].  Serial Number [id number], 100 cc of saline placed Flex HD 4 x 16 cm  INDICATIONS FOR PROCEDURE:  The patient, Dawn Haynes, is a 36 y.o. female born on 1981-05-05, is here for immediate first stage breast reconstruction with placement of left tissue expander and Acellular dermal matrix. MRN: [id number]  CONSENT:  Informed consent was obtained directly from the patient. Risks, benefits and alternatives were fully discussed. Specific risks including but not limited to bleeding, infection, hematoma, seroma, scarring, pain, implant infection, implant extrusion, capsular contracture, asymmetry, wound healing problems, and need for further surgery were all discussed. The patient did have an ample opportunity to have her questions answered to her satisfaction.   DESCRIPTION OF PROCEDURE:  The patient was taken to the operating room by the general surgery team. SCDs were placed and IV antibiotics were given. The patient's chest was prepped and draped in a sterile fashion. A time out was performed and the implants to be used were identified.  Left mastectomy was performed.  Once the general surgery team had completed their portion of the case the patient was rendered to the plastic and reconstructive surgery team.  The pectoralis major muscle was lifted from the chest wall with release of the lateral edge and lateral inframammary fold.  The pocket was irrigated with antibiotic  solution and hemostasis was achieved with electrocautery.  The ADM was then prepared according to the manufacture guidelines and slits placed to help with postoperative fluid management.  The ADM was then sutured to the inferior and lateral edge of the inframammary fold with 2-0 PDS starting with an interrupted stitch and then a running stitch.  The lateral portion was sutured to with interrupted sutures after the expander was placed.  The expander was prepared according to the manufacture guidelines, the air evacuated and then it was placed under the ADM and pectoralis major muscle.  The inferior and lateral tabs were used to secure the expander to the chest wall with 2-0 PDS.  The drain was placed at the inframammary fold over the ADM and secured to the skin with 3-0 Silk.    The deep layers were closed with 3-0 Monocryl followed by 4-0 Monocryl.  The skin was closed with 5-0 Monocryl and then dermabond was applied.  The ABDs and breast binder were placed.  The patient tolerated the procedure well and there were no complications.  The patient was allowed to wake from anesthesia and taken to the recovery room in satisfactory condition.

## 2016-12-23 NOTE — Anesthesia Postprocedure Evaluation (Signed)
Anesthesia Post Note  Patient: Dawn Haynes  Procedure(s) Performed: Procedure(s) (LRB): LEFT MASTECTOMY (Left) IMMEDIATE LEFT BREAST RECONSTRUCTION WITH PLACEMENT OF TISSUE EXPANDER AND FLEX HD (ACELLULAR HYDRATED DERMIS) (Left)     Patient location during evaluation: PACU Anesthesia Type: General Level of consciousness: awake and alert Pain management: pain level controlled Vital Signs Assessment: post-procedure vital signs reviewed and stable Respiratory status: spontaneous breathing, nonlabored ventilation, respiratory function stable and patient connected to nasal cannula oxygen Cardiovascular status: blood pressure returned to baseline and stable Postop Assessment: no signs of nausea or vomiting Anesthetic complications: no    Last Vitals:  Vitals:   12/23/16 1215 12/23/16 1230  BP: (!) 115/58 114/73  Pulse: 76 78  Resp: 16 16  Temp:  36.7 C    Last Pain:  Vitals:   12/23/16 1230  TempSrc:   PainSc: 0-No pain                 Syniyah Bourne,JAMES TERRILL

## 2016-12-23 NOTE — Anesthesia Procedure Notes (Addendum)
Anesthesia Regional Block: Pectoralis block   Pre-Anesthetic Checklist: ,, timeout performed, Correct Patient, Correct Site, Correct Laterality, Correct Procedure, Correct Position, site marked, Risks and benefits discussed,  Surgical consent,  Pre-op evaluation,  At surgeon's request and post-op pain management  Laterality: Left and Upper  Prep: chloraprep       Needles:   Needle Type: Echogenic Stimulator Needle     Needle Length: 9cm  Needle Gauge: 21   Needle insertion depth: 5 cm   Additional Needles:   Procedures: ultrasound guided,,,,,,,,  Narrative:  Start time: 12/23/2016 8:45 AM End time: 12/23/2016 8:56 AM Injection made incrementally with aspirations every 5 mL.  Performed by: Personally  Anesthesiologist: Eliah Marquard

## 2016-12-23 NOTE — Anesthesia Procedure Notes (Signed)
Procedure Name: Intubation Date/Time: 12/23/2016 9:09 AM Performed by: Trixie Deis A Pre-anesthesia Checklist: Patient identified, Emergency Drugs available, Suction available and Patient being monitored Patient Re-evaluated:Patient Re-evaluated prior to inductionOxygen Delivery Method: Circle System Utilized Preoxygenation: Pre-oxygenation with 100% oxygen Intubation Type: IV induction Ventilation: Mask ventilation without difficulty Laryngoscope Size: Mac and 3 Grade View: Grade I Tube type: Oral Tube size: 7.0 mm Number of attempts: 1 Airway Equipment and Method: Stylet and Oral airway Placement Confirmation: ETT inserted through vocal cords under direct vision,  positive ETCO2 and breath sounds checked- equal and bilateral Secured at: 21 cm Tube secured with: Tape Dental Injury: Teeth and Oropharynx as per pre-operative assessment

## 2016-12-23 NOTE — Interval H&P Note (Signed)
History and Physical Interval Note:  12/23/2016 7:49 AM  Dawn Haynes  has presented today for surgery, with the diagnosis of LEFT BREAST CANCER  The various methods of treatment have been discussed with the patient and family. After consideration of risks, benefits and other options for treatment, the patient has consented to  Procedure(s): LEFT MASTECTOMY (Left) IMMEDIATE LEFT BREAST RECONSTRUCTION WITH PLACEMENT OF TISSUE EXPANDER AND FLEX HD (ACELLULAR HYDRATED DERMIS) (Left) as a surgical intervention .  The patient's history has been reviewed, patient examined, no change in status, stable for surgery.  I have reviewed the patient's chart and labs.  Questions were answered to the patient's satisfaction.     Wallace Going

## 2016-12-23 NOTE — Interval H&P Note (Signed)
History and Physical Interval Note:  12/23/2016 8:47 AM  Dawn Haynes  has presented today for surgery, with the diagnosis of LEFT BREAST CANCER  The various methods of treatment have been discussed with the patient and family. After consideration of risks, benefits and other options for treatment, the patient has consented to  Procedure(s): LEFT MASTECTOMY (Left) IMMEDIATE LEFT BREAST RECONSTRUCTION WITH PLACEMENT OF TISSUE EXPANDER AND FLEX HD (ACELLULAR HYDRATED DERMIS) (Left) as a surgical intervention .  The patient's history has been reviewed, patient examined, no change in status, stable for surgery.  I have reviewed the patient's chart and labs.  Questions were answered to the patient's satisfaction.     Keelan Pomerleau

## 2016-12-23 NOTE — Anesthesia Preprocedure Evaluation (Addendum)
Anesthesia Evaluation  Patient identified by MRN, date of birth, ID band Patient awake    Reviewed: Allergy & Precautions, NPO status , Patient's Chart, lab work & pertinent test results  Airway Mallampati: I  TM Distance: >3 FB Neck ROM: Full    Dental  (+) Dental Advisory Given, Teeth Intact   Pulmonary    breath sounds clear to auscultation       Cardiovascular  Rhythm:Regular Rate:Normal     Neuro/Psych    GI/Hepatic   Endo/Other  Breast ca c mets  Renal/GU      Musculoskeletal  (+) Arthritis ,   Abdominal   Peds  Hematology   Anesthesia Other Findings   Reproductive/Obstetrics                            Anesthesia Physical Anesthesia Plan  ASA: III  Anesthesia Plan: General   Post-op Pain Management:  Regional for Post-op pain   Induction: Intravenous  PONV Risk Score and Plan: 3 and Ondansetron, Dexamethasone, Propofol and Midazolam  Airway Management Planned:   Additional Equipment:   Intra-op Plan:   Post-operative Plan: Extubation in OR  Informed Consent: I have reviewed the patients History and Physical, chart, labs and discussed the procedure including the risks, benefits and alternatives for the proposed anesthesia with the patient or authorized representative who has indicated his/her understanding and acceptance.   Dental advisory given  Plan Discussed with: CRNA  Anesthesia Plan Comments:         Anesthesia Quick Evaluation

## 2016-12-23 NOTE — H&P (View-Only) (Signed)
Dawn Haynes is an 36 y.o. female.   Chief Complaint: left breast Cancer HPI: The patient is a 36 y.o. yrs old bf here with her for breast reconstruction and mastectomy.  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  She underwent chemotherapy (carboplatin, gemcitabine).  She had a positive response with no palpable breast mass and resolution of the liver by PET scan.  The ascites improved and the peritoneal catheter was removed.  CT showed posterior right lobe of live with lesion.  She has 6 children (3, 6, 11, 70 and 37 yrs old).  She underwent a lumpectomy but has positive margins.  She has decided on a LEFT mastectomy.  She is 5 feet 3 inches tall, weight is 137 pounds.  Preop bra = 36 B.  Past Medical History:  Diagnosis Date  . Back pain    L1-L2 degenerative   . Breast cancer ([place]) 07/09/2015   left breast  . Breast cancer of upper-outer quadrant of left female breast ([place]) 07/11/2015  . History of blood transfusion    no abnormal reaction noted  . Weakness    numbness and tingling in both hands    Past Surgical History:  Procedure Laterality Date  . BREAST BIOPSY Left   . BREAST LUMPECTOMY WITH RADIOACTIVE SEED AND SENTINEL LYMPH NODE BIOPSY Left 10/28/2016   Procedure: LEFT BREAST LUMPECTOMY WITH BRACKETED RADIOACTIVE SEED AND SENTINEL LYMPH NODE BIOPSY;  Surgeon: [name], MD;  Location: [facility];  Service: General;  Laterality: Left;  . CESAREAN SECTION N/A 01/22/2013   Procedure: primary CESAREAN SECTION of baby boy at 2001;  Surgeon: Cheri Fowler, MD;  Location: [facility];  Service: Obstetrics;  Laterality: N/A;  . CHOLECYSTECTOMY  Aug 2000  . DILATION AND CURETTAGE OF UTERUS    . HERNIA REPAIR    . INDUCED ABORTION    . PORTACATH PLACEMENT Right 07/24/2015   Procedure: INSERTION PORT-A-CATH RIGHT SUBCLAVIAN;  Surgeon: [name], MD;  Location: [facility];  Service: General;  Laterality: Right;  .  RE-EXCISION OF BREAST LUMPECTOMY Left 11/09/2016   Procedure: RE-EXCISION OF BREAST LUMPECTOMY;  Surgeon: [name], MD;  Location: [facility];  Service: General;  Laterality: Left;  . tube put in to drain fluid from near liver    . WISDOM TOOTH EXTRACTION      Family History  Problem Relation Age of Onset  . Diabetes Maternal Grandmother   . Lung cancer Paternal Grandfather    Social History:  reports that she has never smoked. She has never used smokeless tobacco. She reports that she drinks alcohol. She reports that she does not use drugs.  Allergies:  Allergies  Allergen Reactions  . No Known Allergies      (Not in a hospital admission)  Results for orders placed or performed during the hospital encounter of 12/15/16 (from the past 48 hour(s))  Basic metabolic panel     Status: None   Collection Time: 12/15/16  8:22 AM  Result Value Ref Range   Sodium 138 135 - 145 mmol/L   Potassium 4.1 3.5 - 5.1 mmol/L   Chloride 106 101 - 111 mmol/L   CO2 25 22 - 32 mmol/L   Glucose, Bld 78 65 - 99 mg/dL   BUN 9 6 - 20 mg/dL   Creatinine, Ser 0.91 0.44 - 1.00 mg/dL   Calcium 9.2 8.9 - 10.3 mg/dL   GFR calc non Af Amer >60 >  60 mL/min   GFR calc Af Amer >60 >60 mL/min    Comment: (NOTE) The eGFR has been calculated using the CKD EPI equation. This calculation has not been validated in all clinical situations. eGFR's persistently <60 mL/min signify possible Chronic Kidney Disease.    Anion gap 7 5 - 15  CBC     Status: Abnormal   Collection Time: 12/15/16  8:22 AM  Result Value Ref Range   WBC 3.6 (L) 4.0 - 10.5 K/uL   RBC 4.04 3.87 - 5.11 MIL/uL   Hemoglobin 12.2 12.0 - 15.0 g/dL   HCT 37.5 36.0 - 46.0 %   MCV 92.8 78.0 - 100.0 fL   MCH 30.2 26.0 - 34.0 pg   MCHC 32.5 30.0 - 36.0 g/dL   RDW 12.5 11.5 - 15.5 %   Platelets 180 150 - 400 K/uL  hCG, serum, qualitative     Status: None   Collection Time: 12/15/16  8:22 AM  Result Value Ref Range   Preg, Serum  NEGATIVE NEGATIVE    Comment:        THE SENSITIVITY OF THIS METHODOLOGY IS >10 mIU/mL.    No results found.  Review of Systems  Constitutional: Negative.   HENT: Negative.   Eyes: Negative.   Respiratory: Negative.   Cardiovascular: Negative.   Gastrointestinal: Negative.   Genitourinary: Negative.   Musculoskeletal: Negative.   Skin: Negative.   Neurological: Negative.   Endo/Heme/Allergies: Negative.   Psychiatric/Behavioral: Negative.     Last menstrual period 11/20/2016. Physical Exam  Constitutional: She is oriented to person, place, and time. She appears well-developed and well-nourished.  HENT:  Head: Normocephalic and atraumatic.  Eyes: EOM are normal. Pupils are equal, round, and reactive to light.  Cardiovascular: Normal rate.   Respiratory: Effort normal. No respiratory distress.  GI: Soft. She exhibits no distension.  Musculoskeletal: Normal range of motion.  Neurological: She is alert and oriented to person, place, and time.  Skin: Skin is warm. No erythema.  Psychiatric: She has a normal mood and affect. Her behavior is normal. Judgment and thought content normal.     Assessment/Plan We spent extra time talking about the effects of radiation and the plan for use of the Latissimus muscle flap if needed. Plan for left immediate expander / Flex HD placement at the time of the mastectomy.  May need flap.  It will depend on the timing of the radiation  Wallace Going, DO 12/15/2016, 2:36 PM

## 2016-12-23 NOTE — Op Note (Signed)
Left Skin Sparing Mastectomy (preceeding immediate reconstruction)  Indications: This patient presents with history of Left breast cancer s/p lumpectomy and reexcision with positive margins x 2.  The patient already had sentinel lymph node biopsy  Pre-operative Diagnosis:  Left  breast cancer  Post-operative Diagnosis: Left breast cancer  Surgeon: [name]   Anesthesia: General and pectoral block.     ASA Class: 3  Procedure Details  The patient was seen in the Holding Room. The risks, benefits, complications, treatment options, and expected outcomes were discussed with the patient. The possibilities of reaction to medication, pulmonary aspiration, bleeding, infection, the need for additional procedures, failure to diagnose a condition, and creating a complication requiring transfusion or operation were discussed with the patient. The patient concurred with the proposed plan, giving informed consent.  The site of surgery properly noted/marked. The patient was taken to Operating Room # 1, identified as Dawn Haynes and the procedure verified as Left Mastectomy.  After induction of anesthesia, the bilateral breast, and chest were prepped and draped in standard fashion.  The borders of the breast were identified and marked.  The incision was drawn out as an ellipse incorporating the nipple.  Mastectomy hooks were used to provide elevation of the skin edges, and the cautery was used to create the mastectomy flaps.  The dissection was taken to the fascia of the pectoralis major.  The penetrating vessels were clipped as needed.  The superior flap was taken medially to the lateral sternal border, superiorly to the inferior border of the clavicle.  The inferior flap was similarly created, inferiorly to the inframammary fold and laterally to the border of the latissimus.  The breast was taken off including the pectoralis fascia and the axillary tail marked.    The patient was left with Dr. Marla Roe to  complete reconstruction/closure.  Findings: grossly clear surgical margins  Estimated Blood Loss:  less than 50 mL         Drains: per Dr. Marla Roe                Specimens: Left breast.           Complications:  None; patient tolerated the procedure well.         Disposition: PACU - hemodynamically stable.         Condition: stable

## 2016-12-24 ENCOUNTER — Other Ambulatory Visit: Payer: Medicaid Other

## 2016-12-24 ENCOUNTER — Encounter (HOSPITAL_COMMUNITY): Payer: Self-pay | Admitting: General Surgery

## 2016-12-24 ENCOUNTER — Encounter: Payer: Medicaid Other | Admitting: Genetics

## 2016-12-24 DIAGNOSIS — C50412 Malignant neoplasm of upper-outer quadrant of left female breast: Secondary | ICD-10-CM | POA: Diagnosis not present

## 2016-12-24 DIAGNOSIS — C50919 Malignant neoplasm of unspecified site of unspecified female breast: Secondary | ICD-10-CM | POA: Diagnosis present

## 2016-12-24 MED ORDER — POLYETHYLENE GLYCOL 3350 17 G PO PACK: 17.0000 g | PACK | Freq: Every day | ORAL | 0 refills | Status: DC | PRN

## 2016-12-24 MED ORDER — DIAZEPAM 2 MG PO TABS: 2.0000 mg | ORAL_TABLET | Freq: Two times a day (BID) | ORAL | 0 refills | Status: DC

## 2016-12-24 NOTE — Progress Notes (Signed)
1 Day Post-Op   Subjective/Chief Complaint: Patient reports she is feeling well this morning and ready for discharge.  Left breast flap viable and without signs of seroma, hematoma or infection.    Objective: Vital signs in last 24 hours: Temp:  [97.3 F (36.3 C)-98.4 F (36.9 C)] 98.1 F (36.7 C) (06/14 0456) Pulse Rate:  [58-90] 60 (06/14 0456) Resp:  [13-20] 16 (06/14 0456) BP: (102-126)/(55-108) 102/66 (06/14 0456) SpO2:  [98 %-100 %] 100 % (06/14 0456) Last BM Date: 12/22/16  Intake/Output from previous day: 06/13 0701 - 06/14 0700 In: 1390 [P.O.:240; I.V.:1000] Out: 255 [Urine:100; Drains:105; Blood:50] Intake/Output this shift: Total I/O In: 72 [P.O.:275] Out: 40 [Drains:40]  General appearance: alert, cooperative and no distress Left breast flap viablet breast flap viable. Serosanguinous drainage from JP as expected.   Lab Results:  No results for input(s): WBC, HGB, HCT, PLT in the last 72 hours. BMET No results for input(s): NA, K, CL, CO2, GLUCOSE, BUN, CREATININE, CALCIUM in the last 72 hours. PT/INR No results for input(s): LABPROT, INR in the last 72 hours. ABG No results for input(s): PHART, HCO3 in the last 72 hours.  Invalid input(s): PCO2, PO2  Studies/Results: No results found.  Anti-infectives: Anti-infectives    Start     Dose/Rate Route Frequency Ordered Stop   12/23/16 1400  ceFAZolin (ANCEF) IVPB 2g/100 mL premix  Status:  Discontinued     2 g 200 mL/hr over 30 Minutes Intravenous Every 8 hours 12/23/16 1130 12/23/16 1250   12/23/16 1048  polymyxin B 500,000 Units, bacitracin 50,000 Units in sodium chloride irrigation 0.9 % 500 mL irrigation  Status:  Discontinued       As needed 12/23/16 1048 12/23/16 1126   12/23/16 0731  ceFAZolin (ANCEF) IVPB 2g/100 mL premix     2 g 200 mL/hr over 30 Minutes Intravenous On call to O.R. 12/23/16 0732 12/23/16 0930   12/23/16 0731  ceFAZolin (ANCEF) IVPB 2g/100 mL premix  Status:  Discontinued     2  g 200 mL/hr over 30 Minutes Intravenous On call to O.R. 12/23/16 0731 12/23/16 1250      Assessment/Plan: s/p Procedure(s): LEFT MASTECTOMY (Left) IMMEDIATE LEFT BREAST RECONSTRUCTION WITH PLACEMENT OF TISSUE EXPANDER AND FLEX HD (ACELLULAR HYDRATED DERMIS) (Left) Ready for discharge home today.  Patient has prescriptions and questions answered.  Follow up in office next week.   LOS: 1 day   [facility] Plastic Surgery [phone]

## 2016-12-24 NOTE — Discharge Summary (Signed)
Physician Discharge Summary  Patient ID: Dawn Haynes MRN: [id number] DOB/AGE: 1981-02-19 36 y.o.  Admit date: 12/23/2016 Discharge date: 12/24/2016  Admission Diagnoses: Breast cancer of upper-outer quadrant of left female breast    Discharge Diagnoses:  Active Problems:   Acquired absence of breast and absent nipple, left   Discharged Condition: good  Hospital Course:  36 yo female admitted for left mastectomy with immediate left breast reconstruction with placement of tissue expander and ADM. She was referred by Dr. [name] for breast cancer metastases to the liver. 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 This was a T3N1M1 left breast cancer with bilobar liver metastases. She was placed on palliative chemotherapy in January 2017. She received carboplatin and gemcitabine. This was switched to straight gemcitabine. 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 She had a dramatic response to chemotherapy. She has not had repeat breast imaging, but breast mass is no longer palpable. She had resolution of ascites and had her peritoneal catheter removed. CT imaging demonstrates that the only residual liver metastasis visible is a confluent area in the posterior right lobe. She feels very well. She has 6 children including a 105.50 year old boy at home. Her energy has rebounded. She last received chemotherapy in November 2017. After I saw her at her last visit, we did a PET scan to evaluate the activity in the liver. The liver was negative for residual malignancy. However, she did have some recurrent mild asymmetric hypermature metabolism and her left breast in the location of her previous breast cancer. Bilateral diagnostic mammogram was performed as well as ultrasound which confirmed this. There were 2 areas which were biopsied and were both positive for triple negative breast cancer. These were at 1:00 and at 2:30. She was admitted for  mastectomy and immediate reconstruction of the left breast and did well post operatively and is medically stable for discharge.   Consults: None  Significant Diagnostic Studies: Final pathology pending  Treatments: surgery: Left mastectomy followed by immediate reconstruction with placement of tissue expander and ADM   Discharge Exam: Blood pressure 102/66, pulse 60, temperature 98.1 F (36.7 C), temperature source Oral, resp. rate 16, height 5\' 3"  (1.6 m), weight 61.2 kg (135 lb), last menstrual period 12/15/2016, SpO2 100 %. General appearance: alert, cooperative, appears stated age and no distress Resp: clear to auscultation bilaterally Cardio: regular rate and rhythm Left breast flap is viable and without signs of infection, no seroma or hematoma. JP drainage is serosanguinous and moderate.   Disposition: 01-Home or Self Care  Discharge Instructions    Discharge patient    Complete by:  As directed    Discharge disposition:  01-Home or Self Care   Discharge patient date:  12/24/2016     Allergies as of 12/24/2016      Reactions   No Known Allergies       Medication List    TAKE these medications   diazepam 2 MG tablet Commonly known as:  VALIUM Take 1 tablet (2 mg total) by mouth every 12 (twelve) hours.   oxyCODONE 5 MG immediate release tablet Commonly known as:  Oxy IR/ROXICODONE Take 1 tablet (5 mg total) by mouth every 4 (four) hours as needed for moderate pain.   polyethylene glycol packet Commonly known as:  MIRALAX / GLYCOLAX Take 17 g by mouth daily as needed for mild constipation or moderate constipation.   vitamin C 1000 MG tablet Take 1,000 mg by mouth 2 (two)  times daily. IN THE MORNING & IN THE AFTERNOON      Follow-up Information    Dillingham, Loel Lofty, DO Today.   Specialty:  Plastic Surgery Contact information: [place] [address] [phone]        [name], MD In 2 weeks.   Specialty:  General  Surgery Contact information: [address] [place] [address] [phone]           Signed: [facility] [phone]

## 2016-12-24 NOTE — Progress Notes (Signed)
Dawn Haynes to be D/C'd  per MD order. Discussed with the patient and all questions fully answered.  VSS, Skin clean, dry and intact without evidence of skin break down, no evidence of skin tears noted.  IV catheter discontinued intact. Site without signs and symptoms of complications. Dressing and pressure applied.  An After Visit Summary was printed and given to the patient. Patient received prescription.  D/c education completed with patient/family including follow up instructions, medication list, d/c activities limitations if indicated, with other d/c instructions as indicated by MD - patient able to verbalize understanding, all questions fully answered.   JP drain info sheet reviewed with patient and husband. She was instructed on how to empty and record drainage.  Patient instructed to return to ED, call 911, or call MD for any changes in condition.   Patient declined WC and would like to walk out, and D/C home via private auto.

## 2017-01-05 ENCOUNTER — Encounter: Payer: Self-pay | Admitting: Genetics

## 2017-01-05 ENCOUNTER — Other Ambulatory Visit (HOSPITAL_BASED_OUTPATIENT_CLINIC_OR_DEPARTMENT_OTHER): Payer: Medicaid Other

## 2017-01-05 ENCOUNTER — Ambulatory Visit (HOSPITAL_BASED_OUTPATIENT_CLINIC_OR_DEPARTMENT_OTHER): Payer: Medicaid Other | Admitting: Genetics

## 2017-01-05 DIAGNOSIS — C50412 Malignant neoplasm of upper-outer quadrant of left female breast: Secondary | ICD-10-CM

## 2017-01-05 DIAGNOSIS — Z171 Estrogen receptor negative status [ER-]: Secondary | ICD-10-CM

## 2017-01-05 DIAGNOSIS — C50912 Malignant neoplasm of unspecified site of left female breast: Secondary | ICD-10-CM

## 2017-01-05 DIAGNOSIS — Z7183 Encounter for nonprocreative genetic counseling: Secondary | ICD-10-CM

## 2017-01-05 LAB — CBC WITH DIFFERENTIAL/PLATELET
BASO%: 0.3 % (ref 0.0–2.0)
Basophils Absolute: 0 10*3/uL (ref 0.0–0.1)
EOS%: 0.8 % (ref 0.0–7.0)
Eosinophils Absolute: 0 10*3/uL (ref 0.0–0.5)
HCT: 35.7 % (ref 34.8–46.6)
HGB: 11.7 g/dL (ref 11.6–15.9)
LYMPH%: 47.1 % (ref 14.0–49.7)
MCH: 30.4 pg (ref 25.1–34.0)
MCHC: 32.9 g/dL (ref 31.5–36.0)
MCV: 92.5 fL (ref 79.5–101.0)
MONO#: 0.3 10*3/uL (ref 0.1–0.9)
MONO%: 8.3 % (ref 0.0–14.0)
NEUT%: 43.5 % (ref 38.4–76.8)
NEUTROS ABS: 1.6 10*3/uL (ref 1.5–6.5)
Platelets: 196 10*3/uL (ref 145–400)
RBC: 3.86 10*6/uL (ref 3.70–5.45)
RDW: 12.7 % (ref 11.2–14.5)
WBC: 3.7 10*3/uL — AB (ref 3.9–10.3)
lymph#: 1.8 10*3/uL (ref 0.9–3.3)

## 2017-01-05 LAB — COMPREHENSIVE METABOLIC PANEL
ALT: 18 U/L (ref 0–55)
ANION GAP: 8 meq/L (ref 3–11)
AST: 23 U/L (ref 5–34)
Albumin: 3.9 g/dL (ref 3.5–5.0)
Alkaline Phosphatase: 67 U/L (ref 40–150)
BILIRUBIN TOTAL: 0.58 mg/dL (ref 0.20–1.20)
BUN: 10.6 mg/dL (ref 7.0–26.0)
CO2: 25 meq/L (ref 22–29)
CREATININE: 0.9 mg/dL (ref 0.6–1.1)
Calcium: 9.5 mg/dL (ref 8.4–10.4)
Chloride: 107 mEq/L (ref 98–109)
EGFR: >90 mL/min/{1.73_m2} (ref >90)
GLUCOSE: 83 mg/dL (ref 70–140)
Potassium: 4.3 mEq/L (ref 3.5–5.1)
SODIUM: 140 meq/L (ref 136–145)
TOTAL PROTEIN: 7.3 g/dL (ref 6.4–8.3)

## 2017-01-05 NOTE — Progress Notes (Signed)
REFERRING PROVIDER: Hayden Pedro, PA-C [place], [place] [address]  PRIMARY PROVIDER:  [name], [place]  PRIMARY REASON FOR VISIT:  1. Malignant neoplasm of left breast in female, estrogen receptor negative, unspecified site of breast ([place])      HISTORY OF PRESENT ILLNESS:   Dawn Haynes, a 36 y.o. female, was seen for a [facility] cancer genetics consultation at the request of Shona Simpson, PA-C due to a personal history of triple negative breast cancer.  Dawn Haynes presents to clinic today to discuss the possibility of a hereditary predisposition to cancer, genetic testing, and to further clarify her future cancer risks, as well as potential cancer risks for family members.   In 06/2015, at the age of 47, Dawn Haynes was diagnosed with triple negative invasive ductal carcinoma of the left breast. At the time of her initial diagnosis, she had  staging scans revealing concerns for metastatic disease to the liver and peritoneum. She went on to proceed with neoadjuvant chemotherapy in January 2017 and continued this until October 2017. Following completion of neoadjuvant chemotherapy, she underwent left lumpectomy on 10/28/16. After multiple re-excisions with positive margins, she ultimately underwent left mastectomy on 12/23/2016. She plans radiation.  CANCER HISTORY:    Breast cancer of upper-outer quadrant of left female breast ([place])   07/02/2015 Mammogram    Left breast mass 5.3 x 5.2 x 2.7 cm at 1:30 position, multiple abnormal enlarged left axillary lymph nodes      07/09/2015 Initial Diagnosis    Left breast biopsy 1:30: IDC grade 3, ER PR 0%, HER-2 negative ratio 1.65, Ki-67 90%; left axillary lymph node biopsy: IDC grade 3 completely replacing the lymph node ER PR 0%, HER-2 negative ratio 1.22 Ki-67 90%      07/25/2015 -  Chemotherapy    Palliative chemotherapy: Carboplatin and gemcitabine day 1 and day 8 every 3 weeks ( metastatic breast cancer with  extensive liver metastases and malignant recurrent ascites), switched to gemcitabine every 2 weeks      12/02/2015 Imaging    CT CAP: Decrease in the liver metastases now measuring 7 x 5 cm was previously 8.4 x 9.3 cm.      03/05/2016 Imaging    CT CAP: Interval decrease in the heterogeneous posterior right lower lobe mass from 6.5 x 6.1 cm to 6.2 x 4.8 cm       Past Medical History:  Diagnosis Date  . Breast cancer ([place]) 07/09/2015   left breast  . Breast cancer of upper-outer quadrant of left female breast ([place]) 07/11/2015  . DDD (degenerative disc disease), lumbar    L1-2  . History of blood transfusion    "when I lost alot of blood when I went into premature labor"; no abnormal reaction noted  . Weakness    numbness and tingling in both hands    Past Surgical History:  Procedure Laterality Date  . BREAST BIOPSY Left 2018  . BREAST LUMPECTOMY WITH RADIOACTIVE SEED AND SENTINEL LYMPH NODE BIOPSY Left 10/28/2016   Procedure: LEFT BREAST LUMPECTOMY WITH BRACKETED RADIOACTIVE SEED AND SENTINEL LYMPH NODE BIOPSY;  Surgeon: [name], MD;  Location: [facility];  Service: General;  Laterality: Left;  . BREAST RECONSTRUCTION WITH PLACEMENT OF TISSUE EXPANDER AND FLEX HD (ACELLULAR HYDRATED DERMIS) Left 12/23/2016  . BREAST RECONSTRUCTION WITH PLACEMENT OF TISSUE EXPANDER AND FLEX HD (ACELLULAR HYDRATED DERMIS) Left 12/23/2016   Procedure: IMMEDIATE LEFT BREAST RECONSTRUCTION WITH PLACEMENT OF TISSUE EXPANDER AND FLEX HD (ACELLULAR  HYDRATED DERMIS);  Surgeon: Wallace Going, DO;  Location: [facility];  Service: Plastics;  Laterality: Left;  . CESAREAN SECTION N/A 01/22/2013   Procedure: primary CESAREAN SECTION of baby boy at 2001;  Surgeon: Cheri Fowler, MD;  Location: [facility];  Service: Obstetrics;  Laterality: N/A;  . DILATION AND CURETTAGE OF UTERUS    . HERNIA REPAIR     UHR  . INDUCED ABORTION    . LAPAROSCOPIC CHOLECYSTECTOMY  Aug 2000  . MASTECTOMY Left 12/23/2016  .  PARACENTESIS  07/17/2015   tube put in to drain fluid from near liver; US guided/notes 07/17/2015  . PORTACATH PLACEMENT Right 07/24/2015   Procedure: INSERTION PORT-A-CATH RIGHT SUBCLAVIAN;  Surgeon: [name], MD;  Location: [facility];  Service: General;  Laterality: Right;  . RE-EXCISION OF BREAST LUMPECTOMY Left 11/09/2016   Procedure: RE-EXCISION OF BREAST LUMPECTOMY;  Surgeon: [name], MD;  Location: [facility];  Service: General;  Laterality: Left;  . TOTAL MASTECTOMY Left 12/23/2016   Procedure: LEFT MASTECTOMY;  Surgeon: [name], MD;  Location: [facility];  Service: General;  Laterality: Left;  . UMBILICAL HERNIA REPAIR  ~ 1983  . WISDOM TOOTH EXTRACTION  2002    Social History   Social History  . Marital status: Married    Spouse name: N/A  . Number of children: N/A  . Years of education: N/A   Social History Main Topics  . Smoking status: Never Smoker  . Smokeless tobacco: Never Used  . Alcohol use Yes     Comment: 12/23/2016 "might have a drink a few times/year"  . Drug use: No  . Sexual activity: Yes    Birth control/ protection: Condom   Other Topics Concern  . Not on file   Social History Narrative  . No narrative on file     FAMILY HISTORY:  We obtained a detailed, 4-generation family history.  Significant diagnoses are listed below: Family History  Problem Relation Age of Onset  . Diabetes Maternal Grandmother    Dawn Haynes has four sons (ages 25, 42, 25, 58) and two daughters (ages 54 and 67). All are without cancers or tumors. Dawn Haynes has three sisters, ages 25, 68, and 84. Though none of her sisters are known to have cancer, she reports that her youngest sister had thyroid problems beginning last year that required surgery and radiation.  Dawn Haynes mother is 44 and her father is 5. Neither have had cancers. There are no known cancers in any first, second, or third degree relatives on either side of her family. The only other cancer  she reports is that her paternal grandfather's paternal first-cousin died from breast cancer.  Dawn Haynes is unaware of previous family history of genetic testing for hereditary cancer risks. Patient's maternal ancestors are of [place] American and Native American descent, and paternal ancestors are of [place] American and Caucasian descent. There is no reported Ashkenazi Jewish ancestry. There is no known consanguinity.  GENETIC COUNSELING ASSESSMENT: Dawn Haynes is a 36 y.o. female with a personal history of triple negative breast cancer diagnosed at age 89 which is somewhat suggestive of a hereditary cancer syndrome and predisposition to cancer. We, therefore, discussed and recommended the following at today's visit.   DISCUSSION: We reviewed the characteristics, features and inheritance patterns of hereditary cancer syndromes. We also discussed genetic testing, including the appropriate family members to test, the process of testing, insurance coverage and turn-around-time for results. We discussed the implications of a  negative, positive and/or variant of uncertain significant result. We recommended Dawn Haynes pursue genetic testing for the Common Hereditary Cancer Panel offered by [facility]. [facility]'s Common Hereditary Cancers Panel includes analysis of the following 46 genes: APC, ATM, AXIN2, BARD1, BMPR1A, BRCA1, BRCA2, BRIP1, CDH1, CDKN2A, CHEK2, CTNNA1, DICER1, EPCAM, GREM1, HOXB13, KIT, MEN1, MLH1, MSH2, MSH3, MSH6, MUTYH, NBN, NF1, NTHL1, PALB2, PDGFRA, PMS2, POLD1, POLE, PTEN, RAD50, RAD51C, RAD51D, SDHA, SDHB, SDHC, SDHD, SMAD4, SMARCA4, STK11, TP53, TSC1, TSC2, and VHL.  Based on Dawn Haynes's personal history of cancer, she meets medical criteria for genetic testing. Based on previous experience with [facility] and individuals with [facility], Dawn Haynes should not have out-of-pocket expense for her genetic testing.   PLAN: After considering the risks, benefits, and limitations, Ms.  Haynes  provided informed consent to pursue genetic testing and the blood sample was sent to [facility] for analysis of the 46-gene Common Hereditary Cancers Panel. Results should be available within approximately 3 weeks' time, at which point they will be disclosed by telephone to Dawn Haynes, as will any additional recommendations warranted by these results. This information will also be available in Epic.   Lastly, we encouraged Dawn Haynes to remain in contact with cancer genetics annually so that we can continuously update the family history and inform her of any changes in cancer genetics and testing that may be of benefit for this family.   Ms.  Haynes questions were answered to her satisfaction today. Our contact information was provided should additional questions or concerns arise. Thank you for the referral and allowing [place] to share in the care of your patient.   Dawn Misty, MS, [facility] Certified Naval [email or web address] phone: [phone]  The patient was seen for a total of 30 minutes in face-to-face genetic counseling.  This patient was discussed with Drs. Magrinat, [name] and/or Burr Medico who agrees with the above.    _______________________________________________________________________ For Office Staff:  Number of people involved in session: 1 Was an Intern/ student involved with case: no

## 2017-01-07 ENCOUNTER — Ambulatory Visit: Payer: Medicaid Other | Admitting: Radiation Oncology

## 2017-01-11 ENCOUNTER — Ambulatory Visit: Payer: Medicaid Other | Admitting: Radiation Oncology

## 2017-01-11 ENCOUNTER — Ambulatory Visit
Admission: RE | Admit: 2017-01-11 | Discharge: 2017-01-11 | Disposition: A | Discharge status: Home / Self Care | Payer: Medicaid Other | Source: Ambulatory Visit | Attending: Radiation Oncology | Admitting: Radiation Oncology

## 2017-01-11 DIAGNOSIS — C50412 Malignant neoplasm of upper-outer quadrant of left female breast: Secondary | ICD-10-CM | POA: Diagnosis not present

## 2017-01-11 DIAGNOSIS — Z171 Estrogen receptor negative status [ER-]: Principal | ICD-10-CM

## 2017-01-12 ENCOUNTER — Telehealth: Payer: Self-pay | Admitting: Genetics

## 2017-01-12 ENCOUNTER — Encounter: Payer: Self-pay | Admitting: Genetics

## 2017-01-12 ENCOUNTER — Ambulatory Visit: Payer: Self-pay | Admitting: Genetics

## 2017-01-12 DIAGNOSIS — Z1379 Encounter for other screening for genetic and chromosomal anomalies: Secondary | ICD-10-CM

## 2017-01-12 HISTORY — DX: Encounter for other screening for genetic and chromosomal anomalies: Z13.79

## 2017-01-12 NOTE — Telephone Encounter (Signed)
Reviewed that germline genetic testing revealed no pathogenic mutations. This is considered to be a negative result. Testing was performed through [facility]'s 46-gene Common Hereditary Cancers Panel. [facility]'s Common Hereditary Cancers Panel includes analysis of the following 46 genes: APC, ATM, AXIN2, BARD1, BMPR1A, BRCA1, BRCA2, BRIP1, CDH1, CDKN2A, CHEK2, CTNNA1, DICER1, EPCAM, GREM1, HOXB13, KIT, MEN1, MLH1, MSH2, MSH3, MSH6, MUTYH, NBN, NF1, NTHL1, PALB2, PDGFRA, PMS2, POLD1, POLE, PTEN, RAD50, RAD51C, RAD51D, SDHA, SDHB, SDHC, SDHD, SMAD4, SMARCA4, STK11, TP53, TSC1, TSC2, and VHL.  For more detailed discussion, please see genetic counseling documentation from 01/12/2017. Result report dated 01/10/2017.

## 2017-01-12 NOTE — Progress Notes (Signed)
HPI: Ms. Reihl was previously seen in the [facility] clinic on 01/05/2017 due to a personal history of early-onset breast cancer and concerns regarding a hereditary predisposition to cancer. Please refer to our prior cancer genetics clinic note for more information regarding Ms. Minium's medical, social and family histories, and our assessment and recommendations, at the time. Ms. Santino recent genetic test results were disclosed to her, as were recommendations warranted by these results. These results and recommendations are discussed in more detail below.  CANCER HISTORY:  In 06/2015, at the age of 36, Ms. Flanagan was diagnosed with triple negative invasive ductal carcinoma of the left breast. At the time of her initial diagnosis, she had staging scans revealing concerns for metastatic disease to the liver and peritoneum. She went on to proceed withneoadjuvant chemotherapy in January 2017 and continued this until October 2017. Following completion of neoadjuvant chemotherapy, she underwent left lumpectomy on 10/28/16. After multiple re-excisions with positive margins, she ultimately underwent left mastectomy on 12/23/2016. She plans radiation.   Breast cancer of upper-outer quadrant of left female breast ([employer])   07/02/2015 Mammogram    Left breast mass 5.3 x 5.2 x 2.7 cm at 1:30 position, multiple abnormal enlarged left axillary lymph nodes      07/09/2015 Initial Diagnosis    Left breast biopsy 1:30: IDC grade 3, ER PR 0%, HER-2 negative ratio 1.65, Ki-67 90%; left axillary lymph node biopsy: IDC grade 3 completely replacing the lymph node ER PR 0%, HER-2 negative ratio 1.22 Ki-67 90%      07/25/2015 -  Chemotherapy    Palliative chemotherapy: Carboplatin and gemcitabine day 1 and day 8 every 3 weeks ( metastatic breast cancer with extensive liver metastases and malignant recurrent ascites), switched to gemcitabine every 2 weeks      12/02/2015 Imaging    CT CAP: Decrease in the  liver metastases now measuring 7 x 5 cm was previously 8.4 x 9.3 cm.      03/05/2016 Imaging    CT CAP: Interval decrease in the heterogeneous posterior right lower lobe mass from 6.5 x 6.1 cm to 6.2 x 4.8 cm       Breast cancer ([place])   12/24/2016 Initial Diagnosis    Breast cancer ([place])     01/10/2017 Genetic Testing    Genetic counseling and testing for hereditary cancer syndromes were performed on 01/05/2017. Results are negative for pathogenic mutations in 46 genes analyzed by [facility]'s Common Hereditary Cancers Panel. Results are dated 01/10/2017. Genes tested: APC, ATM, AXIN2, BARD1, BMPR1A, BRCA1, BRCA2, BRIP1, CDH1, CDKN2A, CHEK2, CTNNA1, DICER1, EPCAM, GREM1, HOXB13, KIT, MEN1, MLH1, MSH2, MSH3, MSH6, MUTYH, NBN, NF1, NTHL1, PALB2, PDGFRA, PMS2, POLD1, POLE, PTEN, RAD50, RAD51C, RAD51D, SDHA, SDHB, SDHC, SDHD, SMAD4, SMARCA4, STK11, TP53, TSC1, TSC2, and VHL.            FAMILY HISTORY:  We obtained a detailed, 4-generation family history.  Significant diagnoses are listed below: Family History  Problem Relation Age of Onset  . Diabetes Maternal Grandmother    Ms. Orrego has four sons (ages 70, 30, 53, 42) and two daughters (ages 63 and 44). All are without cancers or tumors. Ms. Franko has three sisters, ages 77, 73, and 80. Though none of her sisters are known to have cancer, she reports that her youngest sister had thyroid problems beginning last year that required surgery and radiation.  Ms. Thackston mother is 29 and her father is 45. Neither have had cancers. There are no  known cancers in any first, second, or third degree relatives on either side of her family. The only other cancer she reports is that her paternal grandfather's paternal first-cousin died from breast cancer.  Ms. Ricco is unaware of previous family history of genetic testing for hereditary cancer risks. Patient's maternal ancestors are of [place] American and Native American descent, and paternal ancestors are  of [place] American and Caucasian descent. There is no reported Ashkenazi Jewish ancestry. There is no known consanguinity.   GENETIC TEST RESULTS: Genetic testing performed through [facility]'s Common Hereditary Cancers Panel reported out on 01/10/2017 showed no pathogenic mutations. [facility]'s Common Hereditary Cancers Panel includes analysis of the following 46 genes: APC, ATM, AXIN2, BARD1, BMPR1A, BRCA1, BRCA2, BRIP1, CDH1, CDKN2A, CHEK2, CTNNA1, DICER1, EPCAM, GREM1, HOXB13, KIT, MEN1, MLH1, MSH2, MSH3, MSH6, MUTYH, NBN, NF1, NTHL1, PALB2, PDGFRA, PMS2, POLD1, POLE, PTEN, RAD50, RAD51C, RAD51D, SDHA, SDHB, SDHC, SDHD, SMAD4, SMARCA4, STK11, TP53, TSC1, TSC2, and VHL.  The test report will be scanned into EPIC and will be located under the Molecular Pathology section of the Results Review tab.A portion of the result report is included below for reference.     We discussed with Ms. Parlin that since the current genetic testing is not perfect, it is possible there may be a gene mutation in one of these genes that current testing cannot detect, but that chance is small. We also discussed, that it is possible that another gene that has not yet been discovered, or that we have not yet tested, is responsible for the cancer diagnoses in the family. Therefore, important to remain in touch with cancer genetics in the future so that we can continue to offer Ms. Hepp the most up to date genetic testing.   CANCER SCREENING RECOMMENDATIONS:  This result indicates that it is unlikely Ms. Holohan has an increased risk for a future cancer due to a mutation in one of these genes. Because no causative or actionable mutations were identified, it is recommended she continue to follow the cancer management and screening guidelines provided by her oncology and primary healthcare providers.  RECOMMENDATIONS FOR FAMILY MEMBERS: Women in this family might be at some increased risk of developing breast cancer, over the  general population risk, simply due to the family history of cancer. We recommended women in this family have a yearly mammogram beginning at age 45, or 33 years younger than the earliest onset of cancer, an annual clinical breast exam, and perform monthly breast self-exams. We specifically discussed that Ms. Keeling's daughters should begin annual mammograms and/or annual breast MRIs at age 85 (76 years prior to Ms. Hestand's age at diagnosis). Since her sisters are all over the age of 30, they should begin annual breast screenings through mammograms and/or breast MRIs now, if they have not done so already. Women in this family should also have a gynecological exam as recommended by their primary provider. All family members should have a colonoscopy by age 55.  FOLLOW-UP: Lastly, we discussed with Ms. Satterwhite that cancer genetics is a rapidly advancing field and it is possible that new genetic tests will be appropriate for her and/or her family members in the future. We encouraged her to remain in contact with cancer genetics on an annual basis so we can update her personal and family histories and let her know of advances in cancer genetics that may benefit this family.   Our contact number was provided. Ms. Dier questions were answered to her satisfaction, and she knows  she is welcome to call us at anytime with additional questions or concerns.   Mal Misty, MS, [facility] Certified Naval [email or web address]

## 2017-01-14 NOTE — Progress Notes (Signed)
  Radiation Oncology         [phone] ________________________________  Name: Dawn Haynes MRN: [id number]  Date: 01/11/2017  DOB: 08-02-1980  Optical Surface Tracking Plan:  Since intensity modulated radiotherapy (IMRT) and 3D conformal radiation treatment methods are predicated on accurate and precise positioning for treatment, intrafraction motion monitoring is medically necessary to ensure accurate and safe treatment delivery.  The ability to quantify intrafraction motion without excessive ionizing radiation dose can only be performed with optical surface tracking. Accordingly, surface imaging offers the opportunity to obtain 3D measurements of patient position throughout IMRT and 3D treatments without excessive radiation exposure.  I am ordering optical surface tracking for this patient's upcoming course of radiotherapy. ________________________________  Kyung Rudd, MD 01/14/2017 12:58 PM    Reference:   Ursula Alert, J, et al. Surface imaging-based analysis of intrafraction motion for breast radiotherapy patients.Journal of [place], n. 6, nov. 2014. ISSN [id number].   Available at: <http://www.jacmp.org/index.php/jacmp/article/view/4957>.

## 2017-01-14 NOTE — Progress Notes (Signed)
  Radiation Oncology         [phone] ________________________________  Name: Dawn Haynes MRN: [id number]  Date: 01/11/2017  DOB: 09/06/1980  Diagnosis DIAGNOSIS:     ICD-10-CM   1. Malignant neoplasm of upper-outer quadrant of left breast in female, estrogen receptor negative ([place]) C50.412    Z17.1      SIMULATION AND TREATMENT PLANNING NOTE  The patient presented for simulation prior to beginning her course of radiation treatment for her diagnosis of left-sided breast cancer. The patient was placed in a supine position on a breast board. A customized vac-lock bag was also constructed and this complex treatment device will be used on a daily basis during her treatment. In this fashion, a CT scan was obtained through the chest area and an isocenter was placed near the chest wall at the upper aspect of the right chest. A breath-hold technique has also been evaluated to determine if this significantly improves the spatial relationship between the target region and the heart. Based on this analysis, a breath-hold technique has been ordered for the patient's treatment.  The patient will be planned to receive a course of radiation initially to a dose of 50.4 gray. This will consist of a 4 field technique targeting the left chest wall as well as the supraclavicular region. Therefore 2 customized medial and lateral tangent fields have been created targeting the chest wall, and also 2 additional customized fields have been designed to treat the supraclavicular region both with a left supraclavicular field and a left posterior axillary boost field. A forward planning/reduced field technique will also be evaluated to determine if this significantly improves the dose homogeneity of the overall plan. Therefore, additional customized blocks/fields may be necessary.  This initial treatment will be accomplished at 1.8 gray per fraction.   The initial plan will consist of a 3-D conformal technique. The target  volume/scar, heart and lungs have been contoured and dose volume histograms of each of these structures will be evaluated as part of the 3-D conformal treatment planning process.   It is anticipated that the patient will then receive a 10 gray boost to the surgical scar. This will be accomplished at 2 gray per fraction. The final anticipated total dose therefore will correspond to 60.4 gray.   Special treatment procedure was performed today due to the extra time and effort required by myself to plan and prepare this patient for deep inspiration breath hold technique.  I have determined cardiac sparing to be of benefit to this patient to prevent long term cardiac damage due to radiation of the heart.  Bellows were placed on the patient's abdomen. To facilitate cardiac sparing, the patient was coached by the radiation therapists on breath hold techniques and breathing practice was performed. Practice waveforms were obtained. The patient was then scanned while maintaining breath hold in the treatment position.  This image was then transferred over to the imaging specialist. The imaging specialist then created a fusion of the free breathing and breath hold scans using the chest wall as the stable structure. I personally reviewed the fusion in axial, coronal and sagittal image planes.  Excellent cardiac sparing was obtained.  I felt the patient is an appropriate candidate for breath hold and the patient will be treated as such.  The image fusion was then reviewed with the patient to reinforce the necessity of reproducible breath hold.      _______________________________   Jodelle Gross, MD, PhD

## 2017-01-18 ENCOUNTER — Telehealth: Payer: Self-pay | Admitting: *Deleted

## 2017-01-18 NOTE — Telephone Encounter (Signed)
Patient called and stated she had seen her reconstruction surgeon and stated that the surgeon  spoke with Dr. Lisbeth Renshaw and is holding off on starting radiation for now, but is still on her schedule in my chart to start tomorrow, since MD and Shona Simpson is off today , I will speak with MD tomorrow and call her back at [phone] tomorrow morning,  10:12 AM

## 2017-01-19 ENCOUNTER — Ambulatory Visit: Payer: Medicaid Other | Admitting: Radiation Oncology

## 2017-01-19 IMAGING — CT CT CHEST W/ CM
2 of 4 series · 13 of 36 positions shown, 16 images · IV contrast (APPLIED)
Comparison: 03/05/2016

CLINICAL DATA: Left-sided breast cancer.  Restaging.

EXAM:
CT CHEST, ABDOMEN, AND PELVIS WITH CONTRAST
TECHNIQUE: Multidetector CT imaging of the chest, abdomen and pelvis was
performed following the standard protocol during bolus
administration of intravenous contrast.
CONTRAST:  100mL IBGSR1-5JJ IOPAMIDOL (IBGSR1-5JJ) INJECTION 61%

[Series 2: cap with · axial · 0.72mm/px · z∈[-562,-57]mm · 10 of 125 slices shown, 13 images]
[im 12/125  mediastinal]
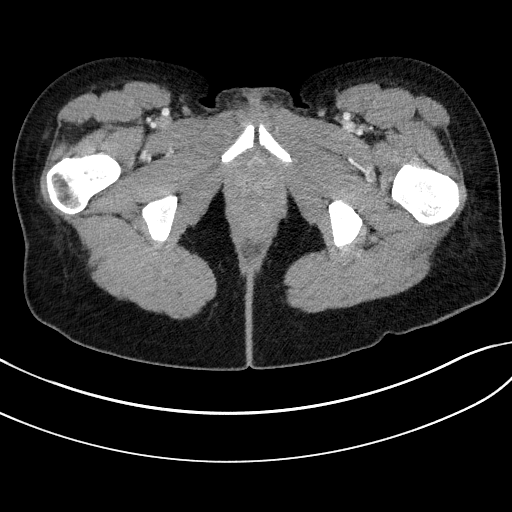
[im 12/125  lung]
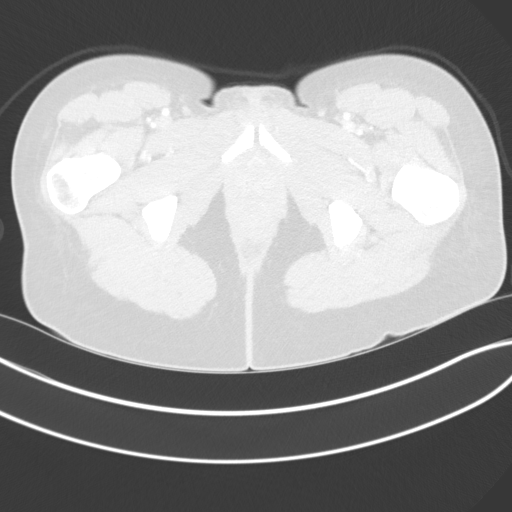
[im 23/125  lung]
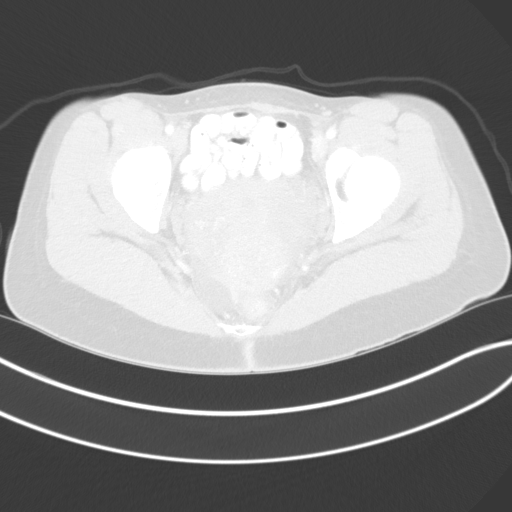
[im 34/125  lung]
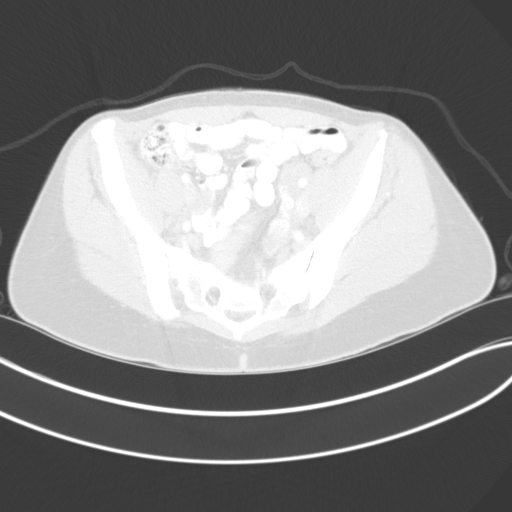
[im 46/125  lung]
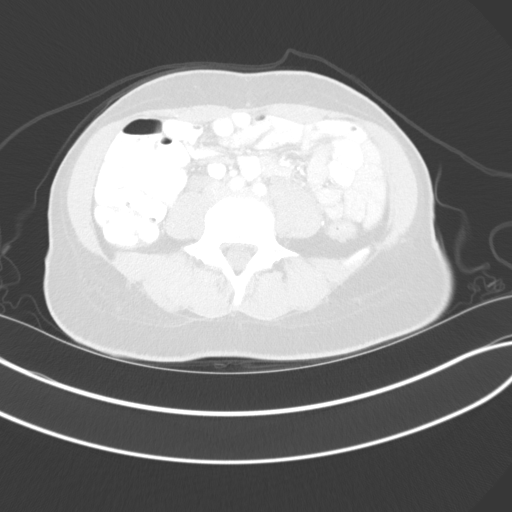
[im 57/125  mediastinal]
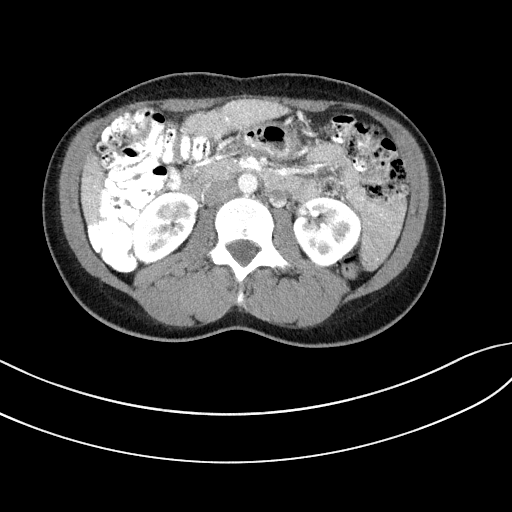
[im 57/125  lung]
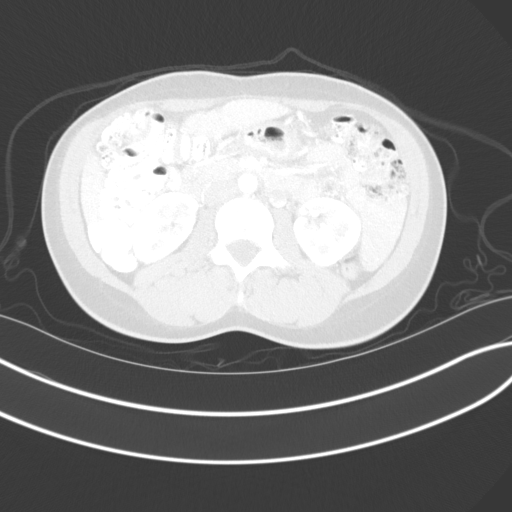
[im 68/125  lung]
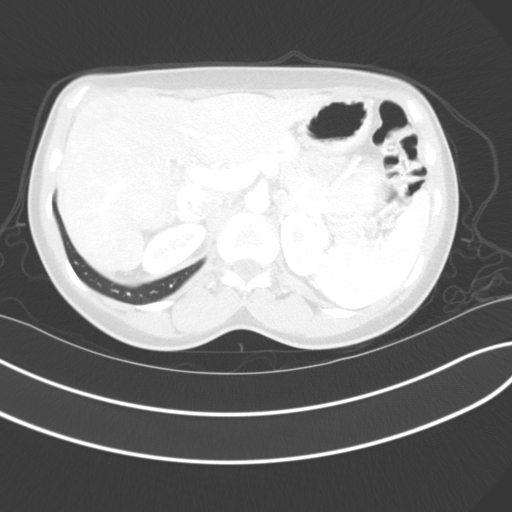
[im 79/125  lung]
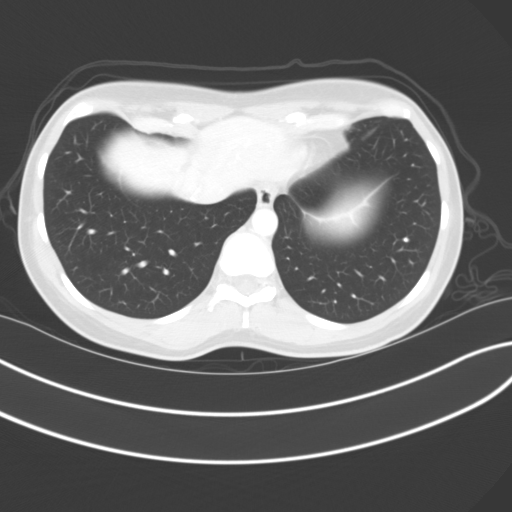
[im 91/125  lung]
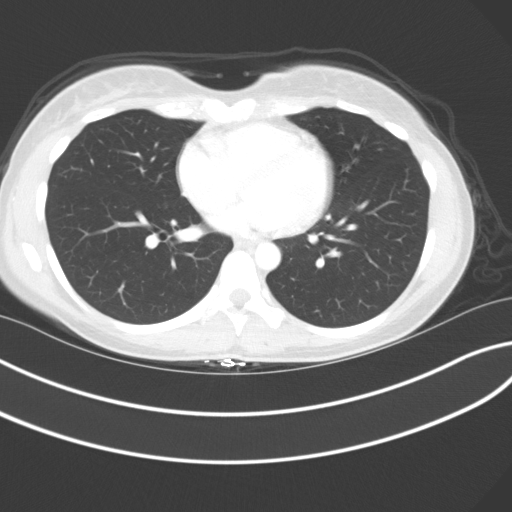
[im 102/125  mediastinal]
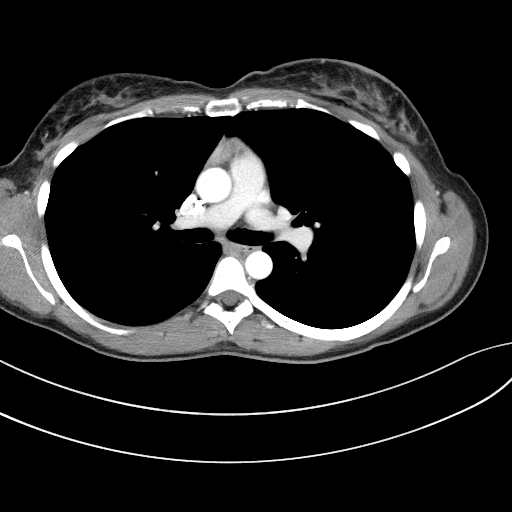
[im 102/125  lung]
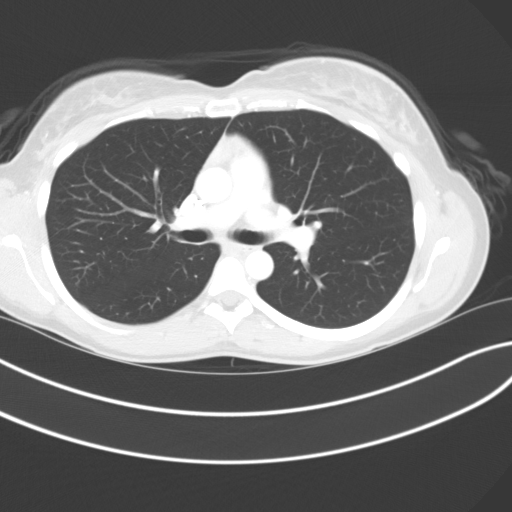
[im 113/125  lung]
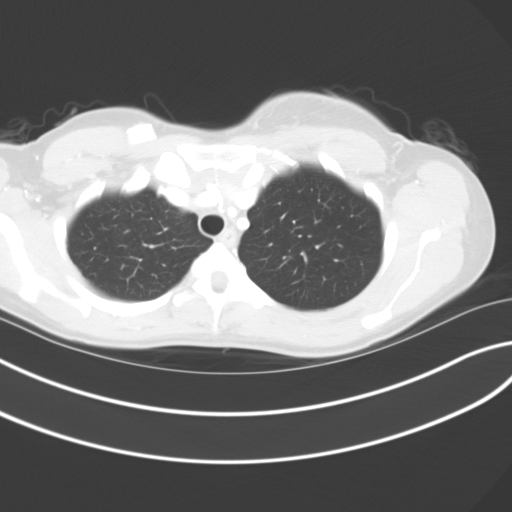

[Series 5: coronals · coronal · 0.71mm/px · 3 of 112 slices shown]
[im 23/112  lung]
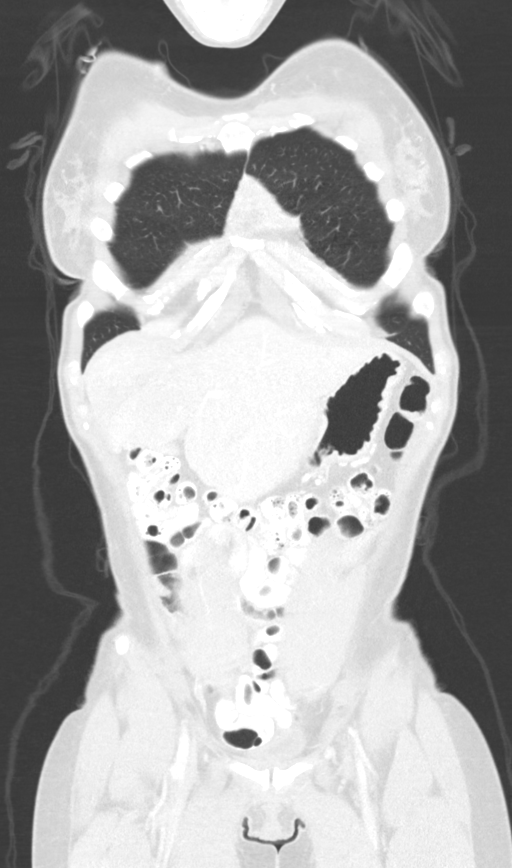
[im 45/112  lung]
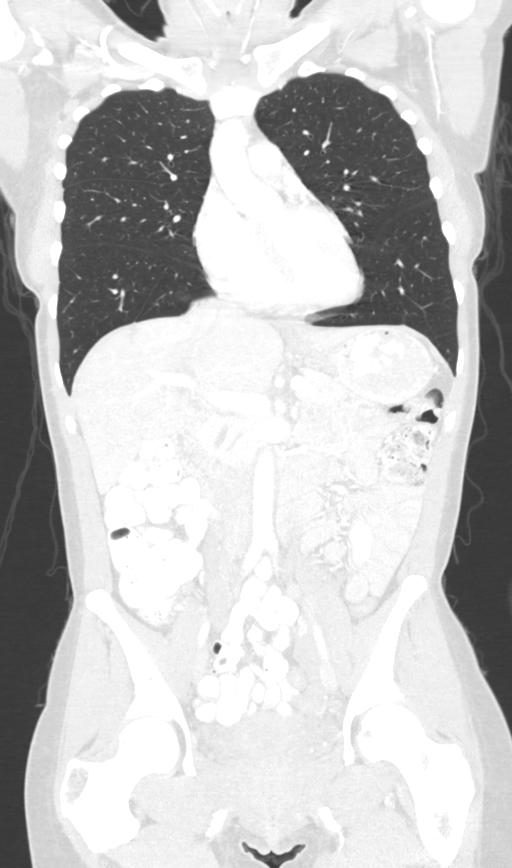
[im 67/112  lung]
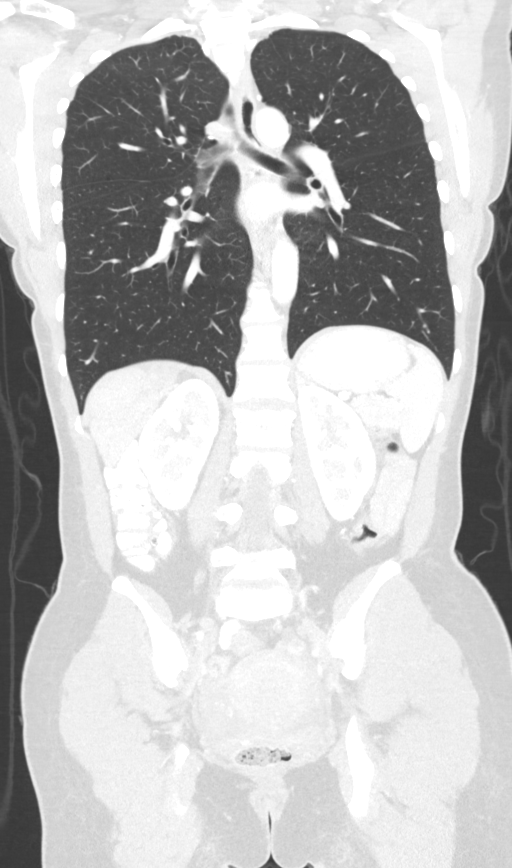

[13 of 36 positions shown; findings below may reference images not displayed]

FINDINGS: CT CHEST FINDINGS

Cardiovascular: Normal heart size.  No pericardial effusion.

Mediastinum/Nodes: The trachea appears patent and is midline. Normal
appearance of the esophagus. No mediastinal or hilar adenopathy.

Lungs/Pleura: The lungs are clear. There is no pleural fluid or
pneumothorax. No suspicious pulmonary nodule or mass identified.

Musculoskeletal: No aggressive lytic or sclerotic bone lesions
identified.

CT ABDOMEN PELVIS FINDINGS

Hepatobiliary: The index lesion arising from the posterior right
lobe of liver measures 5.6 x 3.3 cm, image 56 of series 2.
Previously 6.2 x 4.8 cm. No new or progressive liver metastases.

Pancreas: Unremarkable. No pancreatic ductal dilatation or
surrounding inflammatory changes.

Spleen: Normal in size without focal abnormality.

Adrenals/Urinary Tract: Adrenal glands are unremarkable. Kidneys are
normal, without renal calculi, focal lesion, or hydronephrosis.
Bladder is unremarkable.

Stomach/Bowel: Stomach is within normal limits. Appendix appears
normal. No evidence of bowel wall thickening, distention, or
inflammatory changes.

Vascular/Lymphatic: Duplicated inferior vena cava identified. Normal
appearance of the abdominal aorta. No adenopathy.

Reproductive: Uterus and bilateral adnexa are unremarkable.

Other: Small volume of free fluid noted within the pelvis. This is
nonspecific in a premenopausal female.

Musculoskeletal: No acute or significant osseous findings.
IMPRESSION: 1. Further regression of heterogeneous posterior right liver lobe
mass representing confluent metastases.
2. No new or progressive metastatic disease within the chest,
abdomen or pelvis.

## 2017-01-20 ENCOUNTER — Ambulatory Visit: Payer: Medicaid Other

## 2017-01-20 NOTE — Telephone Encounter (Signed)
Have you talked to Dr. Lisbeth Renshaw about this? I'm not sure what he would recommend

## 2017-01-20 NOTE — Telephone Encounter (Signed)
Yes, he closed her out on the linac 2, will come back a in August for follow up to see where she is at

## 2017-01-21 ENCOUNTER — Ambulatory Visit: Payer: Medicaid Other

## 2017-01-22 ENCOUNTER — Ambulatory Visit: Payer: Medicaid Other

## 2017-01-25 ENCOUNTER — Ambulatory Visit: Payer: Medicaid Other

## 2017-01-26 ENCOUNTER — Ambulatory Visit: Payer: Medicaid Other

## 2017-01-27 ENCOUNTER — Ambulatory Visit: Payer: Medicaid Other

## 2017-01-28 ENCOUNTER — Encounter: Payer: Self-pay | Admitting: Hematology and Oncology

## 2017-01-28 ENCOUNTER — Ambulatory Visit (HOSPITAL_BASED_OUTPATIENT_CLINIC_OR_DEPARTMENT_OTHER): Payer: Medicaid Other | Admitting: Hematology and Oncology

## 2017-01-28 ENCOUNTER — Ambulatory Visit: Payer: Medicaid Other

## 2017-01-28 DIAGNOSIS — C787 Secondary malignant neoplasm of liver and intrahepatic bile duct: Secondary | ICD-10-CM | POA: Diagnosis not present

## 2017-01-28 DIAGNOSIS — C50412 Malignant neoplasm of upper-outer quadrant of left female breast: Secondary | ICD-10-CM

## 2017-01-28 DIAGNOSIS — Z171 Estrogen receptor negative status [ER-]: Secondary | ICD-10-CM

## 2017-01-28 DIAGNOSIS — C773 Secondary and unspecified malignant neoplasm of axilla and upper limb lymph nodes: Secondary | ICD-10-CM | POA: Diagnosis not present

## 2017-01-28 NOTE — Progress Notes (Signed)
Patient Care Team: [name], [place] as PCP - General (Family Medicine) Nicholas Lose, MD as Consulting Physician (Hematology and Oncology) Kyung Rudd, MD as Consulting Physician (Radiation Oncology)  DIAGNOSIS:  Encounter Diagnosis  Name Primary?  . Malignant neoplasm of upper-outer quadrant of left breast in female, estrogen receptor negative (Dawn Haynes)     SUMMARY OF ONCOLOGIC HISTORY:   Breast cancer of upper-outer quadrant of left female breast (Dawn Haynes)   07/02/2015 Mammogram    Left breast mass 5.3 x 5.2 x 2.7 cm at 1:30 position, multiple abnormal enlarged left axillary lymph nodes      07/09/2015 Initial Diagnosis    Left breast biopsy 1:30: IDC grade 3, ER PR 0%, HER-2 negative ratio 1.65, Ki-67 90%; left axillary lymph node biopsy: IDC grade 3 completely replacing the lymph node ER PR 0%, HER-2 negative ratio 1.22 Ki-67 90%      07/25/2015 -  Chemotherapy    Palliative chemotherapy: Carboplatin and gemcitabine day 1 and day 8 every 3 weeks ( metastatic breast cancer with extensive liver metastases and malignant recurrent ascites), switched to gemcitabine every 2 weeks      12/02/2015 Imaging    CT CAP: Decrease in the liver metastases now measuring 7 x 5 cm was previously 8.4 x 9.3 cm.      03/05/2016 Imaging    CT CAP: Interval decrease in the heterogeneous posterior right lower lobe mass from 6.5 x 6.1 cm to 6.2 x 4.8 cm      12/23/2016 Surgery    Status post lumpectomy and reexcision with positive margins 2; required mastectomy; IDC with DCIS grade 3, 6 cm (on prior lumpectomies there were 3.5 cm, 3.3 cm and 2.1 cm tumors); previously SLN negative       Breast cancer (Dawn Haynes)   12/24/2016 Initial Diagnosis    Breast cancer (Dawn Haynes)     01/10/2017 Genetic Testing    Genetic counseling and testing for hereditary cancer syndromes were performed on 01/05/2017. Results are negative for pathogenic mutations in 46 genes analyzed by [facility] Common Hereditary Cancers Panel.  Results are dated 01/10/2017. Genes tested: APC, ATM, AXIN2, BARD1, BMPR1A, BRCA1, BRCA2, BRIP1, CDH1, CDKN2A, CHEK2, CTNNA1, DICER1, EPCAM, GREM1, HOXB13, KIT, MEN1, MLH1, MSH2, MSH3, MSH6, MUTYH, NBN, NF1, NTHL1, PALB2, PDGFRA, PMS2, POLD1, POLE, PTEN, RAD50, RAD51C, RAD51D, SDHA, SDHB, SDHC, SDHD, SMAD4, SMARCA4, STK11, TP53, TSC1, TSC2, and VHL.           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 She still had primary tumor in the breast  All provided to lumpectomies both of which are positive margins which led to a mastectomy. Finally the margins were negative. But overall tumor size is clearly more than 10 cm in size. She is undergoing breast reconstruction surgeries.  REVIEW OF SYSTEMS:   Constitutional: Denies fevers, chills or abnormal weight loss Eyes: Denies blurriness of vision Ears, nose, mouth, throat, and face: Denies mucositis or sore throat Respiratory: Denies cough, dyspnea or wheezes Cardiovascular: Denies palpitation, chest discomfort Gastrointestinal:  Denies nausea, heartburn or change in bowel habits Skin: Denies abnormal skin rashes Lymphatics: Denies new lymphadenopathy or easy bruising Neurological:Denies numbness, tingling or new weaknesses Behavioral/Psych: Mood is stable, no new changes  Extremities: No lower extremity edema Breast: Right mastectomy with reconstruction All other systems were reviewed with the patient and are negative.  I have  reviewed the past medical history, past surgical history, social history and family history with the patient and they are unchanged from previous note.  ALLERGIES:  is allergic to no known allergies.  MEDICATIONS:  Current Outpatient Prescriptions  Medication Sig Dispense Refill  . Ascorbic Acid (VITAMIN C) 1000 MG tablet Take  1,000 mg by mouth 2 (two) times daily. IN THE MORNING & IN THE AFTERNOON    . diazepam (VALIUM) 2 MG tablet Take 1 tablet (2 mg total) by mouth every 12 (twelve) hours. 30 tablet 0  . oxyCODONE (OXY IR/ROXICODONE) 5 MG immediate release tablet Take 1 tablet (5 mg total) by mouth every 4 (four) hours as needed for moderate pain. (Patient not taking: Reported on 12/01/2016) 30 tablet 0  . polyethylene glycol (MIRALAX / GLYCOLAX) packet Take 17 g by mouth daily as needed for mild constipation or moderate constipation. 14 each 0   No current facility-administered medications for this visit.     PHYSICAL EXAMINATION: ECOG PERFORMANCE STATUS: 1 - Symptomatic but completely ambulatory  Vitals:   01/28/17 1425  BP: 113/68  Pulse: 71  Resp: 18  Temp: 98.3 F (36.8 C)   Filed Weights   01/28/17 1425  Weight: 137 lb 1.6 oz (62.2 kg)    GENERAL:alert, no distress and comfortable SKIN: skin color, texture, turgor are normal, no rashes or significant lesions EYES: normal, Conjunctiva are pink and non-injected, sclera clear OROPHARYNX:no exudate, no erythema and lips, buccal mucosa, and tongue normal  NECK: supple, thyroid normal size, non-tender, without nodularity LYMPH:  no palpable lymphadenopathy in the cervical, axillary or inguinal LUNGS: clear to auscultation and percussion with normal breathing effort HEART: regular rate & rhythm and no murmurs and no lower extremity edema ABDOMEN:abdomen soft, non-tender and normal bowel sounds MUSCULOSKELETAL:no cyanosis of digits and no clubbing  NEURO: alert & oriented x 3 with fluent speech, no focal motor/sensory deficits EXTREMITIES: No lower extremity edema  LABORATORY DATA:  I have reviewed the data as listed   Chemistry      Component Value Date/Time   NA 140 01/05/2017 1023   K 4.3 01/05/2017 1023   CL 106 12/15/2016 0822   CO2 25 01/05/2017 1023   BUN 10.6 01/05/2017 1023   CREATININE 0.9 01/05/2017 1023      Component Value  Date/Time   CALCIUM 9.5 01/05/2017 1023   ALKPHOS 67 01/05/2017 1023   AST 23 01/05/2017 1023   ALT 18 01/05/2017 1023   BILITOT 0.58 01/05/2017 1023       Lab Results  Component Value Date   WBC 3.7 (L) 01/05/2017   HGB 11.7 01/05/2017   HCT 35.7 01/05/2017   MCV 92.5 01/05/2017   PLT 196 01/05/2017   NEUTROABS 1.6 01/05/2017    ASSESSMENT & PLAN:    I spent 25 minutes talking to the patient of which more than half was spent in counseling and coordination of care.  Orders Placed This Encounter  Procedures  . NM PET Image Restag (PS) Skull Base To Thigh    Standing Status:   Future    Standing Expiration Date:   01/28/2018    Order Specific Question:   Reason for Exam (SYMPTOM  OR DIAGNOSIS REQUIRED)    Answer:   Metastatic breast cancer restaging    Order Specific Question:   If indicated for the ordered procedure, I authorize the administration of a radiopharmaceutical per Radiology protocol    Answer:   Yes    Order Specific Question:  Is the patient pregnant?    Answer:   No    Order Specific Question:   Preferred imaging location?    Answer:   [facility]    Order Specific Question:   Radiology Contrast Protocol - do NOT remove file path    Answer:   [email or web address]   The patient has a good understanding of the overall plan. she agrees with it. she will call with any problems that may develop before the next visit here.   Rulon Eisenmenger, MD 01/28/17

## 2017-01-28 NOTE — Assessment & Plan Note (Signed)
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, Ki-67 90%;  left axillary lymph node biopsy: IDC grade 3 completely replacing the lymph node ER PR 0%, HER-2 negative ratio 1.22 Ki-67 90%  Left breast mass 5.3 x 5.2 x 2.7 cm at 1:30 position, multiple abnormal enlarged left axillary lymph nodes  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 Palliative chemotherapy with carboplatin and gemcitabine started 07/24/2015 days 1 and 8 every 3 weeks 6 cycles, maintenance gemcitabine alone started 06/02/2017discontinued November 2017 2. 12/23/2016 Status post lumpectomy and reexcision with positive margins 2; required mastectomy; IDC with DCIS grade 3, 6 cm (on prior lumpectomies there were 3.5 cm, 3.3 cm and 2.1 cm tumors); previously SLN negative ----------------------------------------------------------------------- Plan: Continued surveillance with scans in 3 months and follow-up after

## 2017-01-29 ENCOUNTER — Ambulatory Visit: Payer: Medicaid Other

## 2017-02-01 ENCOUNTER — Ambulatory Visit: Payer: Medicaid Other

## 2017-02-02 ENCOUNTER — Ambulatory Visit: Payer: Medicaid Other | Admitting: Hematology and Oncology

## 2017-02-02 ENCOUNTER — Ambulatory Visit: Payer: Medicaid Other

## 2017-02-03 ENCOUNTER — Ambulatory Visit: Payer: Medicaid Other

## 2017-02-04 ENCOUNTER — Ambulatory Visit: Payer: Medicaid Other

## 2017-02-05 ENCOUNTER — Ambulatory Visit: Payer: Medicaid Other

## 2017-02-08 ENCOUNTER — Ambulatory Visit: Payer: Medicaid Other

## 2017-02-09 ENCOUNTER — Ambulatory Visit: Payer: Medicaid Other

## 2017-02-10 ENCOUNTER — Ambulatory Visit: Payer: Medicaid Other

## 2017-02-11 ENCOUNTER — Ambulatory Visit: Payer: Medicaid Other

## 2017-02-12 ENCOUNTER — Ambulatory Visit: Payer: Medicaid Other

## 2017-02-15 ENCOUNTER — Ambulatory Visit: Payer: Medicaid Other

## 2017-02-16 ENCOUNTER — Ambulatory Visit: Payer: Medicaid Other

## 2017-02-16 NOTE — Progress Notes (Signed)
Location of Breast Cancer: Upper-outer quadrant of left female breast  (mets to liver)  FUN re-assessment after re-excision surgery due to her positive margins and undergoing left mastectomy and tissue expander placement prior to proceeding with radiation.   Histology per Pathology Report: Diagnosis  12-23-16 Breast, simple mastectomy, Left INVASIVE AND IN SITU DUCTAL CARCINOMA, GRADE 3, SPANNING ABOUT 6.0 CM ALL MARGINS OF RESECTION ARE NEGATIVE FOR CARCINOMA  Receptor Status: ER(0% negative), PR (0% negative), Her2-neu (negative ratio 1.27), Ki-(40%)  Diagnosis  11-09-16 1. Breast, excision, Left Medial Margin - RESIDUAL INVASIVE DUCTAL CARCINOMA - RESIDUAL HIGH GRADE DUCTAL CARCINOMA IN SITU - CARCINOMA IS PRESENT AT INKED INFERIOR, POSTERIOR AND ANTERIOR MARGINS - FIBROCYSTIC CHANGES - RADIAL SCAR - PREVIOUS SURGICAL SITE CHANGES - SEE COMMENT 2. Breast, excision, Left Superior Margin - RESIDUAL INVASIVE DUCTAL CARCINOMA - RESIDUAL HIGH GRADE DUCTAL CARCINOMA IN SITU - CARCINOMA IS PRESENT AT INKED LATERAL AND MEDIAL MARGINS - PREVIOUS SURGICAL SITE CHANGES 3. Breast, excision, Left Lateral Margin - RESIDUAL INVASIVE DUCTAL CARCINOMA - RESIDUAL HIGH GRADE DUCTAL CARCINOMA IN SITU - MARGINS UNINVOLVED BY CARCINOMA - PREVIOUS SURGICAL SITE CHANGES 4. Breast, excision, Left Inferior Margin - NO RESIDUAL CARCINOMA IDENTIFIED 5. Breast, excision, Left Posterior Margin - RESIDUAL INVASIVE DUCTAL CARCINOMA - RESIDUAL HIGH GRADE DUCTAL CARCINOMA IN SITU - CARCINOMA IS PRESENT AT INKED MEDIAL AND ANTERIOR MARGINS - PREVIOUS SURGICAL SITE CHANGES   Diagnosis: 10-28-16 1. Breast, lumpectomy, Left - INVASIVE AND IN SITU DUCTAL CARCINOMA, 3.5 CM. - INVASIVE CARCINOMA INVOLVES POSTERIOR, LATERAL, INFERIOR, SUPERIOR AND ANTERIOR MARGINS. 2. Breast, lumpectomy, Left Subareolar - INVASIVE AND IN SITU DUCTAL CARCINOMA, 3.3 CM. - INVASIVE CARCINOMA INVOLVES POSTERIOR, LATERAL AND  MEDIAL MARGINS. 3. Breast, lumpectomy, Left resection - INVASIVE AND IN SITU DUCTAL CARCINOMA, 2.1 CM. - INVASIVE CARCINOMA FOCALLY INVOLVES THE MEDIAL MARGIN AND IS LESS THAN 0.1 CM FROM SUPERIOR MARGIN BROADLY. 4. Lymph node, sentinel, biopsy, Left axillary #1 - BENIGN ADIPOSE TISSUE. - NO LYMPH NODE TISSUE OR MALIGNANCY. 5. Lymph node, sentinel, biopsy, Left axillary #2 - ONE BENIGN LYMPH NODE (0/1)  Receptor Status: ER(0% negative), PR (0% negative), Her2-neu (negative ratio 1.27), Ki-(40%)  Diagnosis: 09-16-16 1. Breast, left, needle core biopsy, 1 o'clock - INVASIVE DUCTAL CARCINOMA. - DUCTAL CARCINOMA IN SITU. - SEE COMMENT. 2. Breast, left, needle core biopsy, 2:30 o'clock - INVASIVE DUCTAL CARCINOMA. - DUCTAL CARCINOMA IN SITU  Receptor Status: ER(0% negative), PR (0% negative), Her2-neu (negative ratio 1.27), Ki-(40%)  Did patient present with symptoms (if so, please note symptoms) or was this found on screening mammography?: She felt a lump in the left upper outer quadrant for the past 1 year.  Past/Anticipated interventions by surgeon, if any:12-23-16 Left Mastectomy ,IMMEDIATE LEFT BREAST RECONSTRUCTION WITH PLACEMENT OF TISSUE EXPANDER AND FLEX HD ,11-09-16 Re-excision of left breast lumpectomy, 10-28-16 Left breast lumpectomy, 09-16-16 Left breast biopsy   01-11-17 Had follow up with Dr. Barry Dienes and she is doing well post operatively, her office will make the next appointment. Past/Anticipated interventions by medical oncology, if any: 09-11-16 Dr. [name]  Continued surveillance with a follow-up CT scan in 4 months,   PET/CT scan 08/20/2016, Palliative chemotherapy: Carboplatin and gemcitabine day 1 and day 8 every 3 weeks ( metastatic breast cancer with extensive liver metastases and malignant recurrent ascites), switched to gemcitabine every 2 weeks  Lymphedema issues, if any:No  Pain issues, if any: No   SAFETY ISSUES:  Prior radiation?   No  Pacemaker/ICD? No  Possible current pregnancy? No  Is the patient on methotrexate? No  Menarche age, G38 P6, BC 1 year,  Current Complaints / other details: Married 36 year old woman, Spina bifida,Breast cancer of upper-outer quadrant of left female breast 2016, malignant ascites,extensive liver metastases, lung cancer paternal grandfather Dr. Loel Lofty. [place] note : 11/20/2016=plan for immediate expander/Flex HD placement at the time of mastectomy,may need flap ,depend on timing of radiation Wt Readings from Last 3 Encounters:  02/23/17 139 lb 9.6 oz (63.3 kg)  02/23/17 135 lb (61.2 kg)  01/28/17 137 lb 1.6 oz (62.2 kg)  BP 115/69   Pulse 82   Temp 98.3 F (36.8 C) (Oral)   Resp 18   Ht _0  (1.626 m)   Wt 139 lb 9.6 oz (63.3 kg)   LMP 02/02/2017 (Exact Date)   SpO2 100%   BMI 23.96 kg/m

## 2017-02-17 ENCOUNTER — Ambulatory Visit: Payer: Medicaid Other

## 2017-02-18 ENCOUNTER — Ambulatory Visit: Payer: Medicaid Other

## 2017-02-19 ENCOUNTER — Ambulatory Visit: Payer: Medicaid Other

## 2017-02-22 ENCOUNTER — Ambulatory Visit: Payer: Medicaid Other

## 2017-02-23 ENCOUNTER — Encounter (HOSPITAL_BASED_OUTPATIENT_CLINIC_OR_DEPARTMENT_OTHER): Payer: Self-pay | Admitting: *Deleted

## 2017-02-23 ENCOUNTER — Ambulatory Visit
Admission: RE | Admit: 2017-02-23 | Discharge: 2017-02-23 | Disposition: A | Discharge status: Home / Self Care | Payer: Medicaid Other | Source: Ambulatory Visit | Attending: Radiation Oncology | Admitting: Radiation Oncology

## 2017-02-23 ENCOUNTER — Encounter: Payer: Self-pay | Admitting: Radiation Oncology

## 2017-02-23 ENCOUNTER — Ambulatory Visit: Payer: Self-pay | Admitting: Plastic Surgery

## 2017-02-23 ENCOUNTER — Ambulatory Visit: Payer: Medicaid Other

## 2017-02-23 VITALS — BP 115/69 | HR 82 | Temp 98.3°F | Resp 18 | Ht 64.0 in | Wt 139.6 lb

## 2017-02-23 DIAGNOSIS — Z171 Estrogen receptor negative status [ER-]: Principal | ICD-10-CM

## 2017-02-23 DIAGNOSIS — C50412 Malignant neoplasm of upper-outer quadrant of left female breast: Secondary | ICD-10-CM

## 2017-02-23 HISTORY — DX: Encounter for other screening for genetic and chromosomal anomalies: Z13.79

## 2017-02-23 NOTE — Progress Notes (Signed)
Requested EKG from Dr. Lilia Pro Wright's office. Bring all medications.

## 2017-02-23 NOTE — Progress Notes (Signed)
Radiation Oncology         [phone] ________________________________  Name: Dawn Haynes MRN: [id number]  Date: 02/23/2017  DOB: April 06, 1981  TI:[id number], [place], [place]  [name], MD     REFERRING PHYSICIAN: [name], MD   DIAGNOSIS: The encounter diagnosis was Malignant neoplasm of upper-outer quadrant of left breast in female, estrogen receptor negative ([place]).   HISTORY OF PRESENT ILLNESS: Dawn Haynes is a 36 y.o. female with a history of metastatic breast cancer. 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 On her diagnostic imaging her tumor was 5.3 x 5.2 x 2.7 cm with multiple nodes concerning for adenopathy. Biopsy confirmed disease in the lymph node as well and her original tumor. She had  staging scans revealing concerns for metastatic disease to the liver and peritoneum. She went on to proceed with neoadjuvant chemotherapy in January 2017 and continued this until October 2017. She had a great overall response systemically seen on her post treatment PET scan that revealed hypermetabolic activity along the breast and scarring in the liver with no active malignancy identified in the abdomen or pelvis. She was doing so well that discussion was had about options for local therapy.  She subsequently underwent left lumpectomy on 10/28/16 which revealed a 3.5 cm invasive and in situ ductal carcinoma. The invasive cancer involved all margins and a separate 3.3 cm tumor as well as a 2.1 cm similar tumor was also noted. Of the 2 sentinel lymph nodes assessed, none contained disease. She went back for re-excision on 10/28/16 revealing residual invasive ductal carcinoma and high grade DCIS along the posterior and anterior margins, lateral and medial margins of the superior excision, and DCIS along the left lateral margin, residual DCIS and IDC was also  seen and the medial and anterior margins of the left posterior resection. The patient had another re-excision on 11/09/16 and this still revealed residual disease at the medial, superior, lateral, inferior, and posterior margins.  The patient has since undergone left mastectomy with tissue expander placement on 12/23/16. Final pathology revealed invasive and in situ ductal carcinoma, grade 3, spanning about 6 cm, with all margins of resection negative for carcinoma. She has plans to proceed with exchange of her tissue expander for a silicone implant, and PAC removal this Thursday. She comes today to review recommendations for radiotherapy.   PREVIOUS RADIATION THERAPY: No   PAST MEDICAL HISTORY:  Past Medical History:  Diagnosis Date  . Breast cancer ([place]) 07/09/2015   left breast  . Breast cancer of upper-outer quadrant of left female breast ([place]) 07/11/2015  . DDD (degenerative disc disease), lumbar    L1-2  . Genetic testing 01/12/2017   Ms. Rosiak underwent genetic counseling and testing for hereditary cancer syndromes on 01/05/2017. Her results were negative for mutations in all 46 genes analyzed by [facility]'s 46-gene Common Hereditary Cancers Panel. Genes analyzed include: APC, ATM, AXIN2, BARD1, BMPR1A, BRCA1, BRCA2, BRIP1, CDH1, CDKN2A, CHEK2, CTNNA1, DICER1, EPCAM, GREM1, HOXB13, KIT, MEN1, MLH1, MSH2, MSH3, MSH6, MUTYH, NBN,   . Genetic testing of female    [facility] panel negative 01/2017  . History of blood transfusion    "when I lost alot of blood when I went into premature labor"; no abnormal reaction noted  . Weakness    numbness and tingling in both hands       PAST SURGICAL HISTORY: Past  Surgical History:  Procedure Laterality Date  . BREAST BIOPSY Left 2018  . BREAST LUMPECTOMY WITH RADIOACTIVE SEED AND SENTINEL LYMPH NODE BIOPSY Left 10/28/2016   Procedure: LEFT BREAST LUMPECTOMY WITH BRACKETED RADIOACTIVE SEED AND SENTINEL LYMPH NODE BIOPSY;  Surgeon: [name], MD;   Location: [facility];  Service: General;  Laterality: Left;  . BREAST RECONSTRUCTION WITH PLACEMENT OF TISSUE EXPANDER AND FLEX HD (ACELLULAR HYDRATED DERMIS) Left 12/23/2016  . BREAST RECONSTRUCTION WITH PLACEMENT OF TISSUE EXPANDER AND FLEX HD (ACELLULAR HYDRATED DERMIS) Left 12/23/2016   Procedure: IMMEDIATE LEFT BREAST RECONSTRUCTION WITH PLACEMENT OF TISSUE EXPANDER AND FLEX HD (ACELLULAR HYDRATED DERMIS);  Surgeon: Wallace Going, DO;  Location: [facility];  Service: Plastics;  Laterality: Left;  . CESAREAN SECTION N/A 01/22/2013   Procedure: primary CESAREAN SECTION of baby boy at 2001;  Surgeon: Cheri Fowler, MD;  Location: [facility];  Service: Obstetrics;  Laterality: N/A;  . DILATION AND CURETTAGE OF UTERUS    . HERNIA REPAIR     UHR  . INDUCED ABORTION    . LAPAROSCOPIC CHOLECYSTECTOMY  Aug 2000  . MASTECTOMY Left 12/23/2016  . PARACENTESIS  07/17/2015   tube put in to drain fluid from near liver; US guided/notes 07/17/2015  . PORTACATH PLACEMENT Right 07/24/2015   Procedure: INSERTION PORT-A-CATH RIGHT SUBCLAVIAN;  Surgeon: [name], MD;  Location: [facility];  Service: General;  Laterality: Right;  . RE-EXCISION OF BREAST LUMPECTOMY Left 11/09/2016   Procedure: RE-EXCISION OF BREAST LUMPECTOMY;  Surgeon: [name], MD;  Location: [facility];  Service: General;  Laterality: Left;  . TOTAL MASTECTOMY Left 12/23/2016   Procedure: LEFT MASTECTOMY;  Surgeon: [name], MD;  Location: [place];  Service: General;  Laterality: Left;  . UMBILICAL HERNIA REPAIR  ~ 1983  . WISDOM TOOTH EXTRACTION  2002     FAMILY HISTORY:  Family History  Problem Relation Age of Onset  . Diabetes Maternal Grandmother      SOCIAL HISTORY:  reports that she has never smoked. She has never used smokeless tobacco. She reports that she drinks alcohol. She reports that she does not use drugs. The patient is married and accompanied by her husband and two young children. 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  ALLERGIES: No known allergies   MEDICATIONS:  Current Outpatient Prescriptions  Medication Sig Dispense Refill  . Ascorbic Acid (VITAMIN C) 1000 MG tablet Take 1,000 mg by mouth 2 (two) times daily. IN THE MORNING & IN THE AFTERNOON    . Multiple Vitamin (MULTIVITAMIN) tablet Take 1 tablet by mouth daily.     No current facility-administered medications for this encounter.      REVIEW OF SYSTEMS: On review of systems, the patient reports that she is doing well overall and ready to complete all her treatments. She denies any chest pain, shortness of breath, cough, fevers, chills, night sweats, unintended weight changes. She denies any bowel or bladder disturbances, and denies abdominal pain, nausea or vomiting. She denies any new musculoskeletal or joint aches or pains, new skin lesions or concerns. A complete review of systems is obtained and is otherwise negative.   PHYSICAL EXAM:  Wt Readings from Last 3 Encounters:  02/23/17 135 lb (61.2 kg)  02/23/17 139 lb 9.6 oz (63.3 kg)  01/28/17 137 lb 1.6 oz (62.2 kg)   Temp Readings from Last 3 Encounters:  02/23/17 98.3 F (36.8 C) (Oral)  01/28/17 98.3 F (36.8 C) (Oral)  12/24/16 98.1 F (36.7  C) (Oral)   BP Readings from Last 3 Encounters:  02/23/17 115/69  01/28/17 113/68  12/24/16 102/66   Pulse Readings from Last 3 Encounters:  02/23/17 82  01/28/17 71  12/24/16 60   Pain Assessment Pain Score: 0-No pain/10  In general this is a well appearing African American female in no acute distress. She is alert and oriented x4 and appropriate throughout the examination. HEENT reveals that the patient is normocephalic, atraumatic. EOMs are intact. PERRLA. Skin is intact without any evidence of gross lesions. Cardiopulmonary assessment is negative for acute distress and she exhibits normal effort. Breast exam is deferred.   ECOG = 0  0 - Asymptomatic (Fully active, able to carry on all predisease activities  without restriction)  1 - Symptomatic but completely ambulatory (Restricted in physically strenuous activity but ambulatory and able to carry out work of a light or sedentary nature. For example, light housework, office work)  2 - Symptomatic, <50% in bed during the day (Ambulatory and capable of all self care but unable to carry out any work activities. Up and about more than 50% of waking hours)  3 - Symptomatic, >50% in bed, but not bedbound (Capable of only limited self-care, confined to bed or chair 50% or more of waking hours)  4 - Bedbound (Completely disabled. Cannot carry on any self-care. Totally confined to bed or chair)  5 - Death   Eustace Pen MM, Creech RH, Tormey DC, et al. [phone]). "Toxicity and response criteria of the [facility]". [place] Oncol. 5 (6): 649-55    LABORATORY DATA:  Lab Results  Component Value Date   WBC 3.7 (L) 01/05/2017   HGB 11.7 01/05/2017   HCT 35.7 01/05/2017   MCV 92.5 01/05/2017   PLT 196 01/05/2017   Lab Results  Component Value Date   NA 140 01/05/2017   K 4.3 01/05/2017   CL 106 12/15/2016   CO2 25 01/05/2017   Lab Results  Component Value Date   ALT 18 01/05/2017   AST 23 01/05/2017   ALKPHOS 67 01/05/2017   BILITOT 0.58 01/05/2017      RADIOGRAPHY: No results found.     IMPRESSION/PLAN: 1. 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 The patient is doing well overall. She is planning to proceed with silicone implant exchange this week for her expander and PAC removal. We reviewed the rationale for radiotherapy and again discussed the risks, benefits, short, and long term effects of radiotherapy, and the patient is interested in proceeding. Dr. Lisbeth Renshaw discusses the delivery and logistics of radiotherapy and recommends a course of 6 1/2 weeks of treatment. She will need to return for repeat simulation in about two weeks following her expander to implant  exchange. We anticipate starting radiotherapy about 4 weeks after surgery.    In a visit lasting 25 minutes, greater than 50% of the time was spent face to face discussing logistics of treatment, and coordinating the patient's care.   The above documentation reflects my direct findings during this shared patient visit. Please see the separate note by Dr. Lisbeth Renshaw on this date for the remainder of the patient's plan of care.    Carola Rhine, PAC

## 2017-02-24 ENCOUNTER — Ambulatory Visit: Payer: Medicaid Other

## 2017-02-25 ENCOUNTER — Ambulatory Visit: Payer: Medicaid Other

## 2017-02-25 ENCOUNTER — Ambulatory Visit (HOSPITAL_BASED_OUTPATIENT_CLINIC_OR_DEPARTMENT_OTHER)
Admission: RE | Admit: 2017-02-25 | Discharge: 2017-02-25 | Disposition: A | Discharge status: Home / Self Care | Payer: Medicaid Other | Source: Ambulatory Visit | Attending: Plastic Surgery | Admitting: Plastic Surgery

## 2017-02-25 ENCOUNTER — Encounter (HOSPITAL_BASED_OUTPATIENT_CLINIC_OR_DEPARTMENT_OTHER): Payer: Self-pay | Admitting: Anesthesiology

## 2017-02-25 ENCOUNTER — Ambulatory Visit (HOSPITAL_BASED_OUTPATIENT_CLINIC_OR_DEPARTMENT_OTHER): Payer: Medicaid Other | Admitting: Anesthesiology

## 2017-02-25 ENCOUNTER — Encounter (HOSPITAL_BASED_OUTPATIENT_CLINIC_OR_DEPARTMENT_OTHER)
Admission: RE | Disposition: A | Discharge status: Home / Self Care | Payer: Self-pay | Source: Ambulatory Visit | Attending: Plastic Surgery

## 2017-02-25 DIAGNOSIS — Z79899 Other long term (current) drug therapy: Secondary | ICD-10-CM | POA: Diagnosis not present

## 2017-02-25 DIAGNOSIS — Z833 Family history of diabetes mellitus: Secondary | ICD-10-CM | POA: Diagnosis not present

## 2017-02-25 DIAGNOSIS — Z9049 Acquired absence of other specified parts of digestive tract: Secondary | ICD-10-CM | POA: Diagnosis not present

## 2017-02-25 DIAGNOSIS — M5136 Other intervertebral disc degeneration, lumbar region: Secondary | ICD-10-CM | POA: Insufficient documentation

## 2017-02-25 DIAGNOSIS — Z853 Personal history of malignant neoplasm of breast: Secondary | ICD-10-CM | POA: Diagnosis not present

## 2017-02-25 DIAGNOSIS — Z421 Encounter for breast reconstruction following mastectomy: Secondary | ICD-10-CM | POA: Diagnosis not present

## 2017-02-25 DIAGNOSIS — C787 Secondary malignant neoplasm of liver and intrahepatic bile duct: Secondary | ICD-10-CM | POA: Diagnosis not present

## 2017-02-25 HISTORY — PX: PORT-A-CATH REMOVAL: SHX5289

## 2017-02-25 HISTORY — PX: REMOVAL OF TISSUE EXPANDER AND PLACEMENT OF IMPLANT: SHX6457

## 2017-02-25 SURGERY — REMOVAL, TISSUE EXPANDER, BREAST, WITH IMPLANT INSERTION
Anesthesia: General | Site: Neck | Laterality: Right

## 2017-02-25 MED ORDER — ONDANSETRON HCL 4 MG/2ML IJ SOLN
4.0000 mg | Freq: Once | INTRAMUSCULAR | Status: DC | PRN
Start: 2017-02-25 — End: 2017-02-25

## 2017-02-25 MED ORDER — ACETAMINOPHEN 160 MG/5ML PO SOLN: 325.0000 mg | ORAL | Status: DC | PRN

## 2017-02-25 MED ORDER — OXYCODONE HCL 5 MG PO TABS: 5.0000 mg | ORAL_TABLET | Freq: Once | ORAL | Status: DC | PRN

## 2017-02-25 MED ORDER — OXYCODONE HCL 5 MG/5ML PO SOLN: 5.0000 mg | Freq: Once | ORAL | Status: DC | PRN

## 2017-02-25 MED ORDER — ONDANSETRON HCL 4 MG/2ML IJ SOLN
INTRAMUSCULAR | Status: AC
  Filled 2017-02-25: qty 2

## 2017-02-25 MED ORDER — DEXAMETHASONE SODIUM PHOSPHATE 4 MG/ML IJ SOLN
INTRAMUSCULAR | Status: DC | PRN
  Administered 2017-02-25: 10 mg via INTRAVENOUS

## 2017-02-25 MED ORDER — MIDAZOLAM HCL 2 MG/2ML IJ SOLN
INTRAMUSCULAR | Status: AC
  Filled 2017-02-25: qty 2

## 2017-02-25 MED ORDER — FENTANYL CITRATE (PF) 100 MCG/2ML IJ SOLN
50.0000 ug | INTRAMUSCULAR | Status: DC | PRN
  Administered 2017-02-25: 100 ug via INTRAVENOUS

## 2017-02-25 MED ORDER — FENTANYL CITRATE (PF) 100 MCG/2ML IJ SOLN
INTRAMUSCULAR | Status: AC
  Filled 2017-02-25: qty 2

## 2017-02-25 MED ORDER — OXYCODONE HCL 5 MG PO TABS: 5.0000 mg | ORAL_TABLET | ORAL | Status: DC | PRN

## 2017-02-25 MED ORDER — FENTANYL CITRATE (PF) 100 MCG/2ML IJ SOLN: 25.0000 ug | INTRAMUSCULAR | Status: DC | PRN

## 2017-02-25 MED ORDER — LIDOCAINE HCL (CARDIAC) 20 MG/ML IV SOLN
INTRAVENOUS | Status: DC | PRN
  Administered 2017-02-25: 50 mg via INTRAVENOUS

## 2017-02-25 MED ORDER — EPHEDRINE 5 MG/ML INJ
INTRAVENOUS | Status: AC
Start: 2017-02-25 — End: 2017-02-25
  Filled 2017-02-25: qty 10

## 2017-02-25 MED ORDER — SODIUM CHLORIDE 0.9% FLUSH: 3.0000 mL | INTRAVENOUS | Status: DC | PRN

## 2017-02-25 MED ORDER — ONDANSETRON HCL 4 MG/2ML IJ SOLN
INTRAMUSCULAR | Status: DC | PRN
  Administered 2017-02-25: 4 mg via INTRAVENOUS

## 2017-02-25 MED ORDER — BUPIVACAINE-EPINEPHRINE 0.25% -1:200000 IJ SOLN
INTRAMUSCULAR | Status: DC | PRN
  Administered 2017-02-25: 8 mL

## 2017-02-25 MED ORDER — PHENYLEPHRINE 40 MCG/ML (10ML) SYRINGE FOR IV PUSH (FOR BLOOD PRESSURE SUPPORT)
PREFILLED_SYRINGE | INTRAVENOUS | Status: AC
  Filled 2017-02-25: qty 10

## 2017-02-25 MED ORDER — MEPERIDINE HCL 25 MG/ML IJ SOLN: 6.2500 mg | INTRAMUSCULAR | Status: DC | PRN

## 2017-02-25 MED ORDER — DEXAMETHASONE SODIUM PHOSPHATE 10 MG/ML IJ SOLN
INTRAMUSCULAR | Status: AC
  Filled 2017-02-25: qty 1

## 2017-02-25 MED ORDER — CEFAZOLIN SODIUM-DEXTROSE 2-4 GM/100ML-% IV SOLN
INTRAVENOUS | Status: AC
  Filled 2017-02-25: qty 100

## 2017-02-25 MED ORDER — LIDOCAINE-EPINEPHRINE 1 %-1:100000 IJ SOLN
INTRAMUSCULAR | Status: AC
  Filled 2017-02-25: qty 1

## 2017-02-25 MED ORDER — SCOPOLAMINE 1 MG/3DAYS TD PT72: 1.0000 | MEDICATED_PATCH | Freq: Once | TRANSDERMAL | Status: DC | PRN

## 2017-02-25 MED ORDER — BUPIVACAINE-EPINEPHRINE (PF) 0.25% -1:200000 IJ SOLN
INTRAMUSCULAR | Status: AC
  Filled 2017-02-25: qty 60

## 2017-02-25 MED ORDER — PROPOFOL 10 MG/ML IV BOLUS
INTRAVENOUS | Status: DC | PRN
  Administered 2017-02-25: 150 mg via INTRAVENOUS

## 2017-02-25 MED ORDER — CEFAZOLIN SODIUM-DEXTROSE 2-4 GM/100ML-% IV SOLN
2.0000 g | INTRAVENOUS | Status: AC
  Administered 2017-02-25: 2 g via INTRAVENOUS

## 2017-02-25 MED ORDER — SODIUM CHLORIDE 0.9% FLUSH: 3.0000 mL | Freq: Two times a day (BID) | INTRAVENOUS | Status: DC

## 2017-02-25 MED ORDER — ACETAMINOPHEN 325 MG PO TABS: 650.0000 mg | ORAL_TABLET | ORAL | Status: DC | PRN

## 2017-02-25 MED ORDER — ACETAMINOPHEN 650 MG RE SUPP: 650.0000 mg | RECTAL | Status: DC | PRN

## 2017-02-25 MED ORDER — LACTATED RINGERS IV SOLN
INTRAVENOUS | Status: DC
  Administered 2017-02-25 (×2): via INTRAVENOUS

## 2017-02-25 MED ORDER — ARTIFICIAL TEARS OPHTHALMIC OINT
TOPICAL_OINTMENT | OPHTHALMIC | Status: AC
  Filled 2017-02-25: qty 3.5

## 2017-02-25 MED ORDER — LIDOCAINE 2% (20 MG/ML) 5 ML SYRINGE
INTRAMUSCULAR | Status: AC
  Filled 2017-02-25: qty 5

## 2017-02-25 MED ORDER — MIDAZOLAM HCL 2 MG/2ML IJ SOLN
1.0000 mg | INTRAMUSCULAR | Status: DC | PRN
  Administered 2017-02-25: 2 mg via INTRAVENOUS

## 2017-02-25 MED ORDER — EPHEDRINE SULFATE 50 MG/ML IJ SOLN
INTRAMUSCULAR | Status: DC | PRN
  Administered 2017-02-25: 15 mg via INTRAVENOUS

## 2017-02-25 MED ORDER — ACETAMINOPHEN 325 MG PO TABS: 325.0000 mg | ORAL_TABLET | ORAL | Status: DC | PRN

## 2017-02-25 MED ORDER — BACITRACIN ZINC 500 UNIT/GM EX OINT
TOPICAL_OINTMENT | CUTANEOUS | Status: AC
  Filled 2017-02-25: qty 3.6

## 2017-02-25 MED ORDER — SODIUM CHLORIDE 0.9 % IV SOLN
INTRAVENOUS | Status: DC | PRN
  Administered 2017-02-25: 500 mL

## 2017-02-25 MED ORDER — KETOROLAC TROMETHAMINE 30 MG/ML IJ SOLN: 30.0000 mg | Freq: Once | INTRAMUSCULAR | Status: DC | PRN

## 2017-02-25 MED ORDER — SUCCINYLCHOLINE CHLORIDE 200 MG/10ML IV SOSY
PREFILLED_SYRINGE | INTRAVENOUS | Status: AC
Start: 2017-02-25 — End: 2017-02-25
  Filled 2017-02-25: qty 10

## 2017-02-25 MED ORDER — SODIUM CHLORIDE 0.9 % IV SOLN: 250.0000 mL | INTRAVENOUS | Status: DC | PRN

## 2017-02-25 SURGICAL SUPPLY — 74 items
BAG DECANTER FOR FLEXI CONT (MISCELLANEOUS) ×4 IMPLANT
BINDER BREAST LRG (GAUZE/BANDAGES/DRESSINGS) IMPLANT
BINDER BREAST MEDIUM (GAUZE/BANDAGES/DRESSINGS) ×4 IMPLANT
BINDER BREAST XLRG (GAUZE/BANDAGES/DRESSINGS) IMPLANT
BINDER BREAST XXLRG (GAUZE/BANDAGES/DRESSINGS) IMPLANT
BIOPATCH RED 1 DISK 7.0 (GAUZE/BANDAGES/DRESSINGS) IMPLANT
BIOPATCH RED 1IN DISK 7.0MM (GAUZE/BANDAGES/DRESSINGS)
BLADE CLIPPER SURG (BLADE) IMPLANT
BLADE HEX COATED 2.75 (ELECTRODE) ×4 IMPLANT
BLADE SURG 15 STRL LF DISP TIS (BLADE) ×2 IMPLANT
BLADE SURG 15 STRL SS (BLADE) ×2
BNDG GAUZE ELAST 4 BULKY (GAUZE/BANDAGES/DRESSINGS) ×8 IMPLANT
CANISTER SUCT 1200ML W/VALVE (MISCELLANEOUS) ×4 IMPLANT
CHLORAPREP W/TINT 26ML (MISCELLANEOUS) ×4 IMPLANT
CLOSURE WOUND 1/2 X4 (GAUZE/BANDAGES/DRESSINGS)
COVER BACK TABLE 60X90IN (DRAPES) ×4 IMPLANT
COVER MAYO STAND STRL (DRAPES) ×4 IMPLANT
DECANTER SPIKE VIAL GLASS SM (MISCELLANEOUS) IMPLANT
DERMABOND ADVANCED (GAUZE/BANDAGES/DRESSINGS) ×4
DERMABOND ADVANCED .7 DNX12 (GAUZE/BANDAGES/DRESSINGS) ×4 IMPLANT
DRAIN CHANNEL 19F RND (DRAIN) IMPLANT
DRAPE LAPAROSCOPIC ABDOMINAL (DRAPES) ×4 IMPLANT
DRAPE LAPAROTOMY 100X72 PEDS (DRAPES) ×4 IMPLANT
DRSG PAD ABDOMINAL 8X10 ST (GAUZE/BANDAGES/DRESSINGS) ×4 IMPLANT
DRSG TEGADERM 2-3/8X2-3/4 SM (GAUZE/BANDAGES/DRESSINGS) IMPLANT
ELECT BLADE 4.0 EZ CLEAN MEGAD (MISCELLANEOUS) ×4
ELECT COATED BLADE 2.86 ST (ELECTRODE) ×4 IMPLANT
ELECT REM PT RETURN 9FT ADLT (ELECTROSURGICAL) ×4
ELECTRODE BLDE 4.0 EZ CLN MEGD (MISCELLANEOUS) ×2 IMPLANT
ELECTRODE REM PT RTRN 9FT ADLT (ELECTROSURGICAL) ×2 IMPLANT
EVACUATOR SILICONE 100CC (DRAIN) IMPLANT
GAUZE SPONGE 4X4 12PLY STRL LF (GAUZE/BANDAGES/DRESSINGS) IMPLANT
GLOVE BIO SURGEON STRL SZ 6.5 (GLOVE) ×6 IMPLANT
GLOVE BIO SURGEONS STRL SZ 6.5 (GLOVE) ×2
GOWN STRL REUS W/ TWL LRG LVL3 (GOWN DISPOSABLE) ×4 IMPLANT
GOWN STRL REUS W/TWL LRG LVL3 (GOWN DISPOSABLE) ×4
IMPL SILICONE HI PROF 250CC (Breast) ×2 IMPLANT
IMPLANT SILICONE HI PROF 250CC (Breast) ×4 IMPLANT
IV NS 1000ML (IV SOLUTION)
IV NS 1000ML BAXH (IV SOLUTION) IMPLANT
NDL SAFETY ECLIPSE 18X1.5 (NEEDLE) ×2 IMPLANT
NEEDLE HYPO 18GX1.5 SHARP (NEEDLE) ×2
NEEDLE HYPO 25X1 1.5 SAFETY (NEEDLE) ×4 IMPLANT
NS IRRIG 1000ML POUR BTL (IV SOLUTION) IMPLANT
PACK BASIN DAY SURGERY FS (CUSTOM PROCEDURE TRAY) ×4 IMPLANT
PENCIL BUTTON HOLSTER BLD 10FT (ELECTRODE) ×4 IMPLANT
PIN SAFETY STERILE (MISCELLANEOUS) IMPLANT
SIZER BREAST REUSE 250CC (SIZER) ×2
SIZER BRST REUSE P4.3XHI 250CC (SIZER) ×2 IMPLANT
SLEEVE SCD COMPRESS KNEE MED (MISCELLANEOUS) ×4 IMPLANT
SPONGE GAUZE 2X2 8PLY STER LF (GAUZE/BANDAGES/DRESSINGS)
SPONGE GAUZE 2X2 8PLY STRL LF (GAUZE/BANDAGES/DRESSINGS) IMPLANT
SPONGE LAP 18X18 X RAY DECT (DISPOSABLE) ×8 IMPLANT
STRIP CLOSURE SKIN 1/2X4 (GAUZE/BANDAGES/DRESSINGS) IMPLANT
SUT MNCRL AB 3-0 PS2 18 (SUTURE) IMPLANT
SUT MNCRL AB 4-0 PS2 18 (SUTURE) ×4 IMPLANT
SUT MON AB 3-0 SH 27 (SUTURE) ×2
SUT MON AB 3-0 SH27 (SUTURE) ×2 IMPLANT
SUT MON AB 5-0 PS2 18 (SUTURE) ×4 IMPLANT
SUT PDS 3-0 CT2 (SUTURE)
SUT PDS AB 2-0 CT2 27 (SUTURE) IMPLANT
SUT PDS II 3-0 CT2 27 ABS (SUTURE) IMPLANT
SUT SILK 3 0 PS 1 (SUTURE) IMPLANT
SUT VIC AB 3-0 SH 27 (SUTURE) ×2
SUT VIC AB 3-0 SH 27X BRD (SUTURE) ×2 IMPLANT
SUT VICRYL 4-0 PS2 18IN ABS (SUTURE) ×4 IMPLANT
SYR BULB IRRIGATION 50ML (SYRINGE) ×4 IMPLANT
SYR CONTROL 10ML LL (SYRINGE) ×4 IMPLANT
TOWEL OR 17X24 6PK STRL BLUE (TOWEL DISPOSABLE) ×8 IMPLANT
TRAY DSU PREP LF (CUSTOM PROCEDURE TRAY) ×4 IMPLANT
TUBE CONNECTING 20'X1/4 (TUBING) ×1
TUBE CONNECTING 20X1/4 (TUBING) ×3 IMPLANT
UNDERPAD 30X30 (UNDERPADS AND DIAPERS) ×8 IMPLANT
YANKAUER SUCT BULB TIP NO VENT (SUCTIONS) ×4 IMPLANT

## 2017-02-25 NOTE — H&P (Signed)
Dawn Haynes is an 36 y.o. female.   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  She underwent chemotherapy (carboplatin, gemcitabine).  She had a positive response with no palpable breast mass and resolution of the liver by PET scan.  The ascites improved and the peritoneal catheter was removed.  CT showed posterior right lobe of liver with lesion.  She has 6 children (3, 6, 11, 5 and 40 yrs old).  She underwent a lumpectomy but has positive margins.  She has decided on a LEFT mastectomy.  She is 5 feet 3 inches tall, weight is 137 pounds.  Preop bra = 36 B. The left expander has a total of 240/225 cc expander.   Past Medical History:  Diagnosis Date  . Breast cancer (Dawn Haynes) 07/09/2015   left breast  . Breast cancer of upper-outer quadrant of left female breast ([place]) 07/11/2015  . DDD (degenerative disc disease), lumbar    L1-2  . Genetic testing 01/12/2017   Ms. Rahn underwent genetic counseling and testing for hereditary cancer syndromes on 01/05/2017. Her results were negative for mutations in all 46 genes analyzed by [facility]'s 46-gene Common Hereditary Cancers Panel. Genes analyzed include: APC, ATM, AXIN2, BARD1, BMPR1A, BRCA1, BRCA2, BRIP1, CDH1, CDKN2A, CHEK2, CTNNA1, DICER1, EPCAM, GREM1, HOXB13, KIT, MEN1, MLH1, MSH2, MSH3, MSH6, MUTYH, NBN,   . Genetic testing of female    [facility] panel negative 01/2017  . History of blood transfusion    "when I lost alot of blood when I went into premature labor"; no abnormal reaction noted  . Weakness    numbness and tingling in both hands    Past Surgical History:  Procedure Laterality Date  . BREAST BIOPSY Left 2018  . BREAST LUMPECTOMY WITH RADIOACTIVE SEED AND SENTINEL LYMPH NODE BIOPSY Left  10/28/2016   Procedure: LEFT BREAST LUMPECTOMY WITH BRACKETED RADIOACTIVE SEED AND SENTINEL LYMPH NODE BIOPSY;  Surgeon: [name], MD;  Location: [facility];  Service: General;  Laterality: Left;  . BREAST RECONSTRUCTION WITH PLACEMENT OF TISSUE EXPANDER AND FLEX HD (ACELLULAR HYDRATED DERMIS) Left 12/23/2016  . BREAST RECONSTRUCTION WITH PLACEMENT OF TISSUE EXPANDER AND FLEX HD (ACELLULAR HYDRATED DERMIS) Left 12/23/2016   Procedure: IMMEDIATE LEFT BREAST RECONSTRUCTION WITH PLACEMENT OF TISSUE EXPANDER AND FLEX HD (ACELLULAR HYDRATED DERMIS);  Surgeon: Wallace Going, DO;  Location: [place];  Service: Plastics;  Laterality: Left;  . CESAREAN SECTION N/A 01/22/2013   Procedure: primary CESAREAN SECTION of baby boy at 2001;  Surgeon: Cheri Fowler, MD;  Location: [facility];  Service: Obstetrics;  Laterality: N/A;  . DILATION AND CURETTAGE OF UTERUS    . HERNIA REPAIR     UHR  . INDUCED ABORTION    . LAPAROSCOPIC CHOLECYSTECTOMY  Aug 2000  . MASTECTOMY Left 12/23/2016  . PARACENTESIS  07/17/2015   tube put in to drain fluid from near liver; US guided/notes 07/17/2015  . PORTACATH PLACEMENT Right 07/24/2015   Procedure: INSERTION PORT-A-CATH RIGHT SUBCLAVIAN;  Surgeon: [name], MD;  Location: [facility];  Service: General;  Laterality: Right;  . RE-EXCISION OF BREAST LUMPECTOMY Left 11/09/2016   Procedure: RE-EXCISION OF BREAST LUMPECTOMY;  Surgeon: [name], MD;  Location: [facility];  Service: General;  Laterality: Left;  . TOTAL MASTECTOMY Left 12/23/2016  Procedure: LEFT MASTECTOMY;  Surgeon: [name], MD;  Location: [place] [facility];  Service: General;  Laterality: Left;  . UMBILICAL HERNIA REPAIR  ~ 1983  . WISDOM TOOTH EXTRACTION  2002    Family History  Problem Relation Age of Onset  . Diabetes Maternal Grandmother    Social History:  reports that she has never smoked. She has never used smokeless tobacco. She reports that she drinks alcohol. She reports that she does not  use drugs.  Allergies:  Allergies  Allergen Reactions  . No Known Allergies     Medications Prior to Admission  Medication Sig Dispense Refill  . Ascorbic Acid (VITAMIN C) 1000 MG tablet Take 1,000 mg by mouth 2 (two) times daily. IN THE MORNING & IN THE AFTERNOON    . Multiple Vitamin (MULTIVITAMIN) tablet Take 1 tablet by mouth daily.      No results found for this or any previous visit (from the past 48 hour(s)). No results found.  Review of Systems  Constitutional: Negative.   HENT: Negative.   Eyes: Negative.   Respiratory: Negative.   Cardiovascular: Negative.   Gastrointestinal: Negative.   Genitourinary: Negative.   Musculoskeletal: Negative.   Skin: Negative.   Neurological: Negative.   Psychiatric/Behavioral: Negative.     Blood pressure 109/72, pulse 80, temperature 98.3 F (36.8 C), temperature source Oral, resp. rate 18, height _0  (1.626 m), weight 62.6 kg (138 lb), last menstrual period 02/02/2017, SpO2 100 %. Physical Exam  Constitutional: She is oriented to person, place, and time. She appears well-developed and well-nourished.  HENT:  Head: Normocephalic and atraumatic.  Eyes: Pupils are equal, round, and reactive to light. EOM are normal.  Cardiovascular: Normal rate.   Respiratory: Effort normal.  GI: Soft. She exhibits no distension.  Neurological: She is alert and oriented to person, place, and time.  Skin: Skin is warm and dry. No rash noted. No erythema. No pallor.  Psychiatric: She has a normal mood and affect. Her behavior is normal. Judgment and thought content normal.     Assessment/Plan Plan for removal of expander of left breast and placement of a silicone implant with removal of port-a-cath.  Wallace Going, DO 02/25/2017, 8:22 AM

## 2017-02-25 NOTE — Discharge Instructions (Signed)
No heavy lifting. Continue binder or sports bra. May shower on Friday.   Post Anesthesia Home Care Instructions  Activity: Get plenty of rest for the remainder of the day. A responsible individual must stay with you for 24 hours following the procedure.  For the next 24 hours, DO NOT: -Drive a car -Paediatric nurse -Drink alcoholic beverages -Take any medication unless instructed by your physician -Make any legal decisions or sign important papers.  Meals: Start with liquid foods such as gelatin or soup. Progress to regular foods as tolerated. Avoid greasy, spicy, heavy foods. If nausea and/or vomiting occur, drink only clear liquids until the nausea and/or vomiting subsides. Call your physician if vomiting continues.  Special Instructions/Symptoms: Your throat may feel dry or sore from the anesthesia or the breathing tube placed in your throat during surgery. If this causes discomfort, gargle with warm salt water. The discomfort should disappear within 24 hours.  If you had a scopolamine patch placed behind your ear for the management of post- operative nausea and/or vomiting:  1. The medication in the patch is effective for 72 hours, after which it should be removed.  Wrap patch in a tissue and discard in the trash. Wash hands thoroughly with soap and water. 2. You may remove the patch earlier than 72 hours if you experience unpleasant side effects which may include dry mouth, dizziness or visual disturbances. 3. Avoid touching the patch. Wash your hands with soap and water after contact with the patch.

## 2017-02-25 NOTE — Op Note (Signed)
Op report Unilateral Breast Exchange   DATE OF OPERATION:  02/25/2017  LOCATION: [facility]  SURGICAL DIVISION: Plastic Surgery  PREOPERATIVE DIAGNOSES:  1. History of left breast cancer.  2. Acquired absence of left breast.   POSTOPERATIVE DIAGNOSES:  1. History of left breast cancer.  2. Acquired absence of left breast.   PROCEDURE:  1. Removal of left breast tissue expander and placement of silicone implant. 2. Capsulotomies for implant respositioning. 3. Removal of right chest port-a-cath.  SURGEON: Larence Thone Sanger Chanese Hartsough, DO  ASSISTANT: Shawn Rayburn, PA  ANESTHESIA:  General.   COMPLICATIONS: None.   IMPLANTS: [place] Smooth Round High Profile Gel 250cc. Ref #[id number].  Serial Number [id number]  INDICATIONS FOR PROCEDURE:  The patient, Dawn Haynes, is a 36 y.o. female born on 02-19-1981, is here for treatment for further treatment after a mastectomy and placement of a tissue expander. She now presents for exchange of her expander for an implant.  She requires capsulotomies to better position the implant. MRN: [id number]  CONSENT:  Informed consent was obtained directly from the patient. Risks, benefits and alternatives were fully discussed. Specific risks including but not limited to bleeding, infection, hematoma, seroma, scarring, pain, implant infection, implant extrusion, capsular contracture, asymmetry, wound healing problems, and need for further surgery were all discussed. The patient did have an ample opportunity to have her questions answered to her satisfaction.   DESCRIPTION OF PROCEDURE:  The patient was taken to the operating room. SCDs were placed and IV antibiotics were given. The patient's chest was prepped and draped in a sterile fashion. A time out was performed and the implants to be used were identified.  One percent Xylocaine with epinephrine was used to infiltrate the area.   The old mastectomy scar was opened and superior  mastectomy and inferior mastectomy flaps were re-raised over the pectoralis major muscle. The pectoralis was split to expose the tissue expander which was removed. Inspection of the pocket showed a normal healthy capsule and good integration of the biologic matrix.   Left: Circumferential capsulotomies were performed to allow for breast pocket expansion.  Measurements were made to confirm adequate pocket size for the implant dimensions.  Hemostasis was ensured with electrocautery.  The pocket was irrigated with antibiotic solution.  New gloves were placed.  The implant was placed in the pocket and oriented appropriately. The pectoralis major muscle and capsule on the anterior surface were re-closed with a 3-0 running Monocryl suture. The remaining skin was closed with 4-0 Monocryl deep dermal and 5-0 Monocryl subcuticular stitches.    Attention was turned to the port-a-cath.  Local was injected.  The scar from implantation was excised using a #15 blade.  The bovie was used to dissect to the port.  The port was freed from the soft tissue and the sutures excised.  The 3-0 Vicryl was used to place a ligature suture at the entry point of the catheter.  The patient was placed in [place] and the catheter removed.  The deep layers were closed with 4-0 Monocryl followed by 5-0 Monocryl. Dermabond was applied to the port-a-cath removal site and the left breast.  A breast binder and ABD was applied.  The patient was allowed to wake from anesthesia and taken to the recovery room in satisfactory condition.

## 2017-02-25 NOTE — Anesthesia Postprocedure Evaluation (Signed)
Anesthesia Post Note  Patient: Dawn Haynes  Procedure(s) Performed: Procedure(s) (LRB): REMOVAL OF LEFT BREAST  TISSUE EXPANDER AND PLACEMENT OF LEFT SILICONE IMPLANT (Left) REMOVAL PORT-A-CATH (Right)     Patient location during evaluation: PACU Anesthesia Type: General Level of consciousness: awake Pain management: pain level controlled Vital Signs Assessment: post-procedure vital signs reviewed and stable Respiratory status: spontaneous breathing Cardiovascular status: stable Postop Assessment: no signs of nausea or vomiting Anesthetic complications: no    Last Vitals:  Vitals:   02/25/17 1008 02/25/17 1015  BP:  (!) 139/103  Pulse: 90 94  Resp: 20 15  Temp:    SpO2: 100% 100%    Last Pain:  Vitals:   02/25/17 1029  TempSrc:   PainSc: 0-No pain   Pain Goal:                 Mayela Bullard JR,JOHN Niambi Smoak

## 2017-02-25 NOTE — Transfer of Care (Signed)
Immediate Anesthesia Transfer of Care Note  Patient: Dawn Haynes  Procedure(s) Performed: Procedure(s): REMOVAL OF LEFT BREAST  TISSUE EXPANDER AND PLACEMENT OF LEFT SILICONE IMPLANT (Left) REMOVAL PORT-A-CATH (Right)  Patient Location: PACU  Anesthesia Type:General  Level of Consciousness: awake, alert  and oriented  Airway & Oxygen Therapy: Patient Spontanous Breathing and Patient connected to face mask oxygen  Post-op Assessment: Report given to RN and Post -op Vital signs reviewed and stable  Post vital signs: Reviewed and stable  Last Vitals:  Vitals:   02/25/17 1007 02/25/17 1008  BP: 130/88   Pulse:  90  Resp: 19 20  Temp: (P) 36.4 C   SpO2:  100%    Last Pain:  Vitals:   02/25/17 0659  TempSrc: Oral         Complications: No apparent anesthesia complications

## 2017-02-25 NOTE — Addendum Note (Signed)
Addendum  created 02/25/17 1136 by Willa Frater, CRNA   Anesthesia Intra Flowsheets edited

## 2017-02-25 NOTE — Anesthesia Procedure Notes (Signed)
Procedure Name: LMA Insertion Date/Time: 02/25/2017 8:41 AM Performed by: Melynda Ripple D Pre-anesthesia Checklist: Patient identified, Emergency Drugs available, Suction available and Patient being monitored Patient Re-evaluated:Patient Re-evaluated prior to induction Oxygen Delivery Method: Circle system utilized Preoxygenation: Pre-oxygenation with 100% oxygen Induction Type: IV induction Ventilation: Mask ventilation without difficulty LMA: LMA inserted LMA Size: 3.0 Number of attempts: 1 Airway Equipment and Method: Bite block Placement Confirmation: positive ETCO2 Tube secured with: Tape Dental Injury: Teeth and Oropharynx as per pre-operative assessment

## 2017-02-25 NOTE — Anesthesia Preprocedure Evaluation (Signed)
Anesthesia Evaluation  Patient identified by MRN, date of birth, ID band Patient awake    Reviewed: Allergy & Precautions, NPO status , Patient's Chart, lab work & pertinent test results  Airway Mallampati: I       Dental no notable dental hx. (+) Teeth Intact   Pulmonary neg pulmonary ROS,    Pulmonary exam normal breath sounds clear to auscultation       Cardiovascular Normal cardiovascular exam Rhythm:Regular Rate:Normal     Neuro/Psych negative neurological ROS  negative psych ROS   GI/Hepatic negative GI ROS, Neg liver ROS,   Endo/Other  negative endocrine ROS  Renal/GU negative Renal ROS  negative genitourinary   Musculoskeletal   Abdominal Normal abdominal exam  (+)   Peds  Hematology negative hematology ROS (+)   Anesthesia Other Findings   Reproductive/Obstetrics negative OB ROS                             Anesthesia Physical Anesthesia Plan  ASA: II  Anesthesia Plan: General   Post-op Pain Management:    Induction: Intravenous  PONV Risk Score and Plan: 3 and Ondansetron, Dexamethasone, Midazolam and Propofol infusion  Airway Management Planned: LMA  Additional Equipment:   Intra-op Plan:   Post-operative Plan:   Informed Consent: I have reviewed the patients History and Physical, chart, labs and discussed the procedure including the risks, benefits and alternatives for the proposed anesthesia with the patient or authorized representative who has indicated his/her understanding and acceptance.   Dental advisory given  Plan Discussed with: CRNA and Surgeon  Anesthesia Plan Comments:         Anesthesia Quick Evaluation

## 2017-02-26 ENCOUNTER — Ambulatory Visit: Payer: Medicaid Other

## 2017-02-26 ENCOUNTER — Encounter (HOSPITAL_BASED_OUTPATIENT_CLINIC_OR_DEPARTMENT_OTHER): Payer: Self-pay | Admitting: Plastic Surgery

## 2017-03-01 ENCOUNTER — Ambulatory Visit: Payer: Medicaid Other

## 2017-03-02 ENCOUNTER — Ambulatory Visit: Payer: Medicaid Other

## 2017-03-25 ENCOUNTER — Ambulatory Visit
Admission: RE | Admit: 2017-03-25 | Discharge: 2017-03-25 | Disposition: A | Discharge status: Home / Self Care | Payer: Medicaid Other | Source: Ambulatory Visit | Attending: Radiation Oncology | Admitting: Radiation Oncology

## 2017-03-25 DIAGNOSIS — Z171 Estrogen receptor negative status [ER-]: Secondary | ICD-10-CM | POA: Diagnosis present

## 2017-03-25 DIAGNOSIS — C50412 Malignant neoplasm of upper-outer quadrant of left female breast: Secondary | ICD-10-CM | POA: Diagnosis present

## 2017-03-25 DIAGNOSIS — Z510 Encounter for antineoplastic radiation therapy: Secondary | ICD-10-CM | POA: Diagnosis not present

## 2017-03-26 NOTE — Progress Notes (Signed)
  Radiation Oncology         [phone] ________________________________  Name: Dawn Haynes MRN: [id number]  Date: 03/25/2017  DOB: [birth year]  Optical Surface Tracking Plan:  Since intensity modulated radiotherapy (IMRT) and 3D conformal radiation treatment methods are predicated on accurate and precise positioning for treatment, intrafraction motion monitoring is medically necessary to ensure accurate and safe treatment delivery.  The ability to quantify intrafraction motion without excessive ionizing radiation dose can only be performed with optical surface tracking. Accordingly, surface imaging offers the opportunity to obtain 3D measurements of patient position throughout IMRT and 3D treatments without excessive radiation exposure.  I am ordering optical surface tracking for this patient's upcoming course of radiotherapy. ________________________________  Kyung Rudd, MD 03/26/2017 5:57 PM    Reference:   Ursula Alert, J, et al. Surface imaging-based analysis of intrafraction motion for breast radiotherapy patients.Journal of [place], n. 6, nov. 2014. ISSN [id number].   Available at: <http://www.jacmp.org/index.php/jacmp/article/view/4957>.

## 2017-03-26 NOTE — Progress Notes (Signed)
  Radiation Oncology         [phone] ________________________________  Name: Dawn Haynes MRN: [id number]  Date: 03/25/2017  DOB: Feb 19, 1981  Diagnosis DIAGNOSIS:     ICD-10-CM   1. Malignant neoplasm of upper-outer quadrant of left breast in female, estrogen receptor negative ([place]) C50.412    Z17.1      SIMULATION AND TREATMENT PLANNING NOTE  The patient presented for simulation prior to beginning her course of radiation treatment for her diagnosis of left-sided breast cancer. The patient was placed in a supine position on a breast board. A customized vac-lock bag was also constructed and this complex treatment device will be used on a daily basis during her treatment. In this fashion, a CT scan was obtained through the chest area and an isocenter was placed near the chest wall at the upper aspect of the right chest. A breath-hold technique has also been evaluated to determine if this significantly improves the spatial relationship between the target region and the heart. Based on this analysis, a breath-hold technique has been ordered for the patient's treatment.  The patient will be planned to receive a course of radiation initially to a dose of 50.4 gray. This will consist of a 4 field technique targeting the left chest wall as well as the supraclavicular region. Therefore 2 customized medial and lateral tangent fields have been created targeting the chest wall, and also 2 additional customized fields have been designed to treat the supraclavicular region both with a left supraclavicular field and a left posterior axillary boost field. A forward planning/reduced field technique will also be evaluated to determine if this significantly improves the dose homogeneity of the overall plan. Therefore, additional customized blocks/fields may be necessary.  This initial treatment will be accomplished at 1.8 gray per fraction.   The initial plan will consist of a 3-D conformal technique. The target  volume/scar, heart and lungs have been contoured and dose volume histograms of each of these structures will be evaluated as part of the 3-D conformal treatment planning process.   It is anticipated that the patient will then receive a 10 gray boost to the surgical scar. This will be accomplished at 2 gray per fraction. The final anticipated total dose therefore will correspond to 60.4 gray.    Special treatment procedure was performed today due to the extra time and effort required by myself to plan and prepare this patient for deep inspiration breath hold technique.  I have determined cardiac sparing to be of benefit to this patient to prevent long term cardiac damage due to radiation of the heart.  Bellows were placed on the patient's abdomen. To facilitate cardiac sparing, the patient was coached by the radiation therapists on breath hold techniques and breathing practice was performed. Practice waveforms were obtained. The patient was then scanned while maintaining breath hold in the treatment position.  This image was then transferred over to the imaging specialist. The imaging specialist then created a fusion of the free breathing and breath hold scans using the chest wall as the stable structure. I personally reviewed the fusion in axial, coronal and sagittal image planes.  Excellent cardiac sparing was obtained.  I felt the patient is an appropriate candidate for breath hold and the patient will be treated as such.  The image fusion was then reviewed with the patient to reinforce the necessity of reproducible breath hold.     _______________________________   Jodelle Gross, MD, PhD

## 2017-03-30 DIAGNOSIS — Z510 Encounter for antineoplastic radiation therapy: Secondary | ICD-10-CM | POA: Diagnosis not present

## 2017-04-01 ENCOUNTER — Ambulatory Visit
Admission: RE | Admit: 2017-04-01 | Discharge: 2017-04-01 | Disposition: A | Discharge status: Home / Self Care | Payer: Medicaid Other | Source: Ambulatory Visit | Attending: Radiation Oncology | Admitting: Radiation Oncology

## 2017-04-02 DIAGNOSIS — Z510 Encounter for antineoplastic radiation therapy: Secondary | ICD-10-CM | POA: Diagnosis not present

## 2017-04-05 ENCOUNTER — Ambulatory Visit
Admission: RE | Admit: 2017-04-05 | Discharge: 2017-04-05 | Disposition: A | Discharge status: Home / Self Care | Payer: Medicaid Other | Source: Ambulatory Visit | Attending: Radiation Oncology | Admitting: Radiation Oncology

## 2017-04-05 DIAGNOSIS — Z510 Encounter for antineoplastic radiation therapy: Secondary | ICD-10-CM | POA: Diagnosis not present

## 2017-04-06 ENCOUNTER — Ambulatory Visit
Admission: RE | Admit: 2017-04-06 | Discharge: 2017-04-06 | Disposition: A | Discharge status: Home / Self Care | Payer: Medicaid Other | Source: Ambulatory Visit | Attending: Radiation Oncology | Admitting: Radiation Oncology

## 2017-04-06 DIAGNOSIS — Z510 Encounter for antineoplastic radiation therapy: Secondary | ICD-10-CM | POA: Diagnosis not present

## 2017-04-06 DIAGNOSIS — Z171 Estrogen receptor negative status [ER-]: Principal | ICD-10-CM

## 2017-04-06 DIAGNOSIS — C50412 Malignant neoplasm of upper-outer quadrant of left female breast: Secondary | ICD-10-CM

## 2017-04-06 MED ORDER — RADIAPLEXRX EX GEL
Freq: Once | CUTANEOUS | Status: AC
  Administered 2017-04-06: 13:00:00 via TOPICAL

## 2017-04-06 MED ORDER — ALRA NON-METALLIC DEODORANT (RAD-ONC)
1.0000 "application " | Freq: Once | TOPICAL | Status: AC
  Administered 2017-04-06: 1 via TOPICAL

## 2017-04-07 ENCOUNTER — Ambulatory Visit
Admission: RE | Admit: 2017-04-07 | Discharge: 2017-04-07 | Disposition: A | Discharge status: Home / Self Care | Payer: Medicaid Other | Source: Ambulatory Visit | Attending: Radiation Oncology | Admitting: Radiation Oncology

## 2017-04-07 DIAGNOSIS — Z510 Encounter for antineoplastic radiation therapy: Secondary | ICD-10-CM | POA: Diagnosis not present

## 2017-04-07 NOTE — Progress Notes (Signed)
Pt here for patient teaching.  Pt given Radiation and You booklet, skin care instructions, Alra deodorant and Radiaplex gel.  Reviewed areas of pertinence such as fatigue, skin changes, breast tenderness and breast swelling . Pt able to give teach back of to pat skin, use unscented/gentle soap and drink plenty of water,apply Radiaplex bid, avoid applying anything to skin within 4 hours of treatment, avoid wearing an under wire bra and to use an electric razor if they must shave. Pt demonstrated understanding of information given and will contact nursing with any questions or concerns.     Http://[email or web address]/treatmentinformation/whattoexpect/index

## 2017-04-08 ENCOUNTER — Ambulatory Visit
Admission: RE | Admit: 2017-04-08 | Discharge: 2017-04-08 | Disposition: A | Discharge status: Home / Self Care | Payer: Medicaid Other | Source: Ambulatory Visit | Attending: Radiation Oncology | Admitting: Radiation Oncology

## 2017-04-08 DIAGNOSIS — Z510 Encounter for antineoplastic radiation therapy: Secondary | ICD-10-CM | POA: Diagnosis not present

## 2017-04-09 ENCOUNTER — Ambulatory Visit
Admission: RE | Admit: 2017-04-09 | Discharge: 2017-04-09 | Disposition: A | Discharge status: Home / Self Care | Payer: Medicaid Other | Source: Ambulatory Visit | Attending: Radiation Oncology | Admitting: Radiation Oncology

## 2017-04-09 DIAGNOSIS — Z510 Encounter for antineoplastic radiation therapy: Secondary | ICD-10-CM | POA: Diagnosis not present

## 2017-04-12 ENCOUNTER — Ambulatory Visit
Admission: RE | Admit: 2017-04-12 | Discharge: 2017-04-12 | Disposition: A | Discharge status: Home / Self Care | Payer: Medicaid Other | Source: Ambulatory Visit | Attending: Radiation Oncology | Admitting: Radiation Oncology

## 2017-04-12 DIAGNOSIS — Z510 Encounter for antineoplastic radiation therapy: Secondary | ICD-10-CM | POA: Diagnosis not present

## 2017-04-13 ENCOUNTER — Ambulatory Visit
Admission: RE | Admit: 2017-04-13 | Discharge: 2017-04-13 | Disposition: A | Discharge status: Home / Self Care | Payer: Medicaid Other | Source: Ambulatory Visit | Attending: Radiation Oncology | Admitting: Radiation Oncology

## 2017-04-13 DIAGNOSIS — Z510 Encounter for antineoplastic radiation therapy: Secondary | ICD-10-CM | POA: Diagnosis not present

## 2017-04-14 ENCOUNTER — Ambulatory Visit
Admission: RE | Admit: 2017-04-14 | Discharge: 2017-04-14 | Disposition: A | Discharge status: Home / Self Care | Payer: Medicaid Other | Source: Ambulatory Visit | Attending: Radiation Oncology | Admitting: Radiation Oncology

## 2017-04-14 DIAGNOSIS — Z510 Encounter for antineoplastic radiation therapy: Secondary | ICD-10-CM | POA: Diagnosis not present

## 2017-04-15 ENCOUNTER — Ambulatory Visit
Admission: RE | Admit: 2017-04-15 | Discharge: 2017-04-15 | Disposition: A | Discharge status: Home / Self Care | Payer: Medicaid Other | Source: Ambulatory Visit | Attending: Radiation Oncology | Admitting: Radiation Oncology

## 2017-04-15 DIAGNOSIS — Z510 Encounter for antineoplastic radiation therapy: Secondary | ICD-10-CM | POA: Diagnosis not present

## 2017-04-16 ENCOUNTER — Ambulatory Visit
Admission: RE | Admit: 2017-04-16 | Discharge: 2017-04-16 | Disposition: A | Discharge status: Home / Self Care | Payer: Medicaid Other | Source: Ambulatory Visit | Attending: Radiation Oncology | Admitting: Radiation Oncology

## 2017-04-16 DIAGNOSIS — Z510 Encounter for antineoplastic radiation therapy: Secondary | ICD-10-CM | POA: Diagnosis not present

## 2017-04-19 ENCOUNTER — Ambulatory Visit
Admission: RE | Admit: 2017-04-19 | Discharge: 2017-04-19 | Disposition: A | Discharge status: Home / Self Care | Payer: Medicaid Other | Source: Ambulatory Visit | Attending: Radiation Oncology | Admitting: Radiation Oncology

## 2017-04-19 DIAGNOSIS — Z510 Encounter for antineoplastic radiation therapy: Secondary | ICD-10-CM | POA: Diagnosis not present

## 2017-04-20 ENCOUNTER — Ambulatory Visit
Admission: RE | Admit: 2017-04-20 | Discharge: 2017-04-20 | Disposition: A | Discharge status: Home / Self Care | Payer: Medicaid Other | Source: Ambulatory Visit | Attending: Radiation Oncology | Admitting: Radiation Oncology

## 2017-04-20 DIAGNOSIS — Z510 Encounter for antineoplastic radiation therapy: Secondary | ICD-10-CM | POA: Diagnosis not present

## 2017-04-21 ENCOUNTER — Ambulatory Visit
Admission: RE | Admit: 2017-04-21 | Discharge: 2017-04-21 | Disposition: A | Discharge status: Home / Self Care | Payer: Medicaid Other | Source: Ambulatory Visit | Attending: Radiation Oncology | Admitting: Radiation Oncology

## 2017-04-21 DIAGNOSIS — Z510 Encounter for antineoplastic radiation therapy: Secondary | ICD-10-CM | POA: Diagnosis not present

## 2017-04-22 ENCOUNTER — Other Ambulatory Visit: Payer: Self-pay | Admitting: Radiation Oncology

## 2017-04-22 ENCOUNTER — Ambulatory Visit
Admission: RE | Admit: 2017-04-22 | Discharge: 2017-04-22 | Disposition: A | Discharge status: Home / Self Care | Payer: Medicaid Other | Source: Ambulatory Visit | Attending: Radiation Oncology | Admitting: Radiation Oncology

## 2017-04-22 DIAGNOSIS — I890 Lymphedema, not elsewhere classified: Secondary | ICD-10-CM

## 2017-04-22 DIAGNOSIS — Z510 Encounter for antineoplastic radiation therapy: Secondary | ICD-10-CM | POA: Diagnosis not present

## 2017-04-23 ENCOUNTER — Ambulatory Visit
Admission: RE | Admit: 2017-04-23 | Discharge: 2017-04-23 | Disposition: A | Discharge status: Home / Self Care | Payer: Medicaid Other | Source: Ambulatory Visit | Attending: Radiation Oncology | Admitting: Radiation Oncology

## 2017-04-23 DIAGNOSIS — Z510 Encounter for antineoplastic radiation therapy: Secondary | ICD-10-CM | POA: Diagnosis not present

## 2017-04-26 ENCOUNTER — Ambulatory Visit
Admission: RE | Admit: 2017-04-26 | Discharge: 2017-04-26 | Disposition: A | Discharge status: Home / Self Care | Payer: Medicaid Other | Source: Ambulatory Visit | Attending: Radiation Oncology | Admitting: Radiation Oncology

## 2017-04-26 DIAGNOSIS — Z510 Encounter for antineoplastic radiation therapy: Secondary | ICD-10-CM | POA: Diagnosis not present

## 2017-04-27 ENCOUNTER — Ambulatory Visit
Admission: RE | Admit: 2017-04-27 | Discharge: 2017-04-27 | Disposition: A | Discharge status: Home / Self Care | Payer: Medicaid Other | Source: Ambulatory Visit | Attending: Radiation Oncology | Admitting: Radiation Oncology

## 2017-04-27 ENCOUNTER — Ambulatory Visit: Payer: Medicaid Other

## 2017-04-27 DIAGNOSIS — Z510 Encounter for antineoplastic radiation therapy: Secondary | ICD-10-CM | POA: Diagnosis not present

## 2017-04-28 ENCOUNTER — Ambulatory Visit
Admission: RE | Admit: 2017-04-28 | Discharge: 2017-04-28 | Disposition: A | Discharge status: Home / Self Care | Payer: Medicaid Other | Source: Ambulatory Visit | Attending: Radiation Oncology | Admitting: Radiation Oncology

## 2017-04-28 DIAGNOSIS — Z510 Encounter for antineoplastic radiation therapy: Secondary | ICD-10-CM | POA: Diagnosis not present

## 2017-04-29 ENCOUNTER — Ambulatory Visit
Admission: RE | Admit: 2017-04-29 | Discharge: 2017-04-29 | Disposition: A | Discharge status: Home / Self Care | Payer: Medicaid Other | Source: Ambulatory Visit | Attending: Radiation Oncology | Admitting: Radiation Oncology

## 2017-04-29 DIAGNOSIS — Z510 Encounter for antineoplastic radiation therapy: Secondary | ICD-10-CM | POA: Diagnosis not present

## 2017-04-30 ENCOUNTER — Ambulatory Visit
Admission: RE | Admit: 2017-04-30 | Discharge: 2017-04-30 | Disposition: A | Discharge status: Home / Self Care | Payer: Medicaid Other | Source: Ambulatory Visit | Attending: Radiation Oncology | Admitting: Radiation Oncology

## 2017-04-30 DIAGNOSIS — Z510 Encounter for antineoplastic radiation therapy: Secondary | ICD-10-CM | POA: Diagnosis not present

## 2017-05-03 ENCOUNTER — Ambulatory Visit
Admission: RE | Admit: 2017-05-03 | Discharge: 2017-05-03 | Disposition: A | Discharge status: Home / Self Care | Payer: Medicaid Other | Source: Ambulatory Visit | Attending: Radiation Oncology | Admitting: Radiation Oncology

## 2017-05-03 DIAGNOSIS — Z510 Encounter for antineoplastic radiation therapy: Secondary | ICD-10-CM | POA: Diagnosis not present

## 2017-05-04 ENCOUNTER — Ambulatory Visit
Admission: RE | Admit: 2017-05-04 | Discharge: 2017-05-04 | Disposition: A | Discharge status: Home / Self Care | Payer: Medicaid Other | Source: Ambulatory Visit | Attending: Radiation Oncology | Admitting: Radiation Oncology

## 2017-05-04 DIAGNOSIS — Z510 Encounter for antineoplastic radiation therapy: Secondary | ICD-10-CM | POA: Diagnosis not present

## 2017-05-05 ENCOUNTER — Ambulatory Visit
Admission: RE | Admit: 2017-05-05 | Discharge: 2017-05-05 | Disposition: A | Discharge status: Home / Self Care | Payer: Medicaid Other | Source: Ambulatory Visit | Attending: Radiation Oncology | Admitting: Radiation Oncology

## 2017-05-05 DIAGNOSIS — Z510 Encounter for antineoplastic radiation therapy: Secondary | ICD-10-CM | POA: Diagnosis not present

## 2017-05-06 ENCOUNTER — Ambulatory Visit
Admission: RE | Admit: 2017-05-06 | Discharge: 2017-05-06 | Disposition: A | Discharge status: Home / Self Care | Payer: Medicaid Other | Source: Ambulatory Visit | Attending: Radiation Oncology | Admitting: Radiation Oncology

## 2017-05-06 DIAGNOSIS — Z510 Encounter for antineoplastic radiation therapy: Secondary | ICD-10-CM | POA: Diagnosis not present

## 2017-05-07 ENCOUNTER — Ambulatory Visit
Admission: RE | Admit: 2017-05-07 | Discharge: 2017-05-07 | Disposition: A | Discharge status: Home / Self Care | Payer: Medicaid Other | Source: Ambulatory Visit | Attending: Radiation Oncology | Admitting: Radiation Oncology

## 2017-05-07 ENCOUNTER — Ambulatory Visit: Payer: Medicaid Other | Admitting: Radiation Oncology

## 2017-05-07 DIAGNOSIS — Z510 Encounter for antineoplastic radiation therapy: Secondary | ICD-10-CM | POA: Diagnosis not present

## 2017-05-10 ENCOUNTER — Ambulatory Visit
Admission: RE | Admit: 2017-05-10 | Discharge: 2017-05-10 | Disposition: A | Discharge status: Home / Self Care | Payer: Medicaid Other | Source: Ambulatory Visit | Attending: Radiation Oncology | Admitting: Radiation Oncology

## 2017-05-10 DIAGNOSIS — Z510 Encounter for antineoplastic radiation therapy: Secondary | ICD-10-CM | POA: Diagnosis not present

## 2017-05-11 ENCOUNTER — Ambulatory Visit
Admission: RE | Admit: 2017-05-11 | Discharge: 2017-05-11 | Disposition: A | Discharge status: Home / Self Care | Payer: Medicaid Other | Source: Ambulatory Visit | Attending: Radiation Oncology | Admitting: Radiation Oncology

## 2017-05-11 ENCOUNTER — Ambulatory Visit: Payer: Medicaid Other | Attending: Radiation Oncology | Admitting: Physical Therapy

## 2017-05-11 DIAGNOSIS — M25612 Stiffness of left shoulder, not elsewhere classified: Secondary | ICD-10-CM | POA: Diagnosis present

## 2017-05-11 DIAGNOSIS — Z510 Encounter for antineoplastic radiation therapy: Secondary | ICD-10-CM | POA: Diagnosis not present

## 2017-05-11 DIAGNOSIS — I890 Lymphedema, not elsewhere classified: Secondary | ICD-10-CM | POA: Insufficient documentation

## 2017-05-11 NOTE — Therapy (Addendum)
[place], [place], [address] Phone: [phone]   Fax:  [phone]  Physical Therapy Evaluation  Patient Details  Name: Dawn Haynes MRN: [id number] Date of Birth: [birth year] Referring Provider: Dr. Kyung Rudd  Encounter Date: 05/11/2017      PT End of Session - 05/11/17 1704    Visit Number 1   Number of Visits 4   Date for PT Re-Evaluation 06/11/17   PT Start Time 1606   PT Stop Time 1655   PT Time Calculation (min) 49 min   Activity Tolerance Patient tolerated treatment well   Behavior During Therapy [facility] for tasks assessed/performed      Past Medical History:  Diagnosis Date  . Breast cancer ([place]) 07/09/2015   left breast  . Breast cancer of upper-outer quadrant of left female breast ([place]) 07/11/2015  . DDD (degenerative disc disease), lumbar    L1-2  . Genetic testing 01/12/2017   Ms. Preusser underwent genetic counseling and testing for hereditary cancer syndromes on 01/05/2017. Her results were negative for mutations in all 46 genes analyzed by [employer]'s 46-gene Common Hereditary Cancers Panel. Genes analyzed include: APC, ATM, AXIN2, BARD1, BMPR1A, BRCA1, BRCA2, BRIP1, CDH1, CDKN2A, CHEK2, CTNNA1, DICER1, EPCAM, GREM1, HOXB13, KIT, MEN1, MLH1, MSH2, MSH3, MSH6, MUTYH, NBN,   . Genetic testing of female    [employer] panel negative 01/2017  . History of blood transfusion    "when I lost alot of blood when I went into premature labor"; no abnormal reaction noted  . Weakness    numbness and tingling in both hands    Past Surgical History:  Procedure Laterality Date  . BREAST BIOPSY Left 2018  . BREAST LUMPECTOMY WITH RADIOACTIVE SEED AND SENTINEL LYMPH NODE BIOPSY Left 10/28/2016   Procedure: LEFT BREAST LUMPECTOMY WITH BRACKETED RADIOACTIVE SEED AND SENTINEL LYMPH NODE BIOPSY;  Surgeon: [name], MD;  Location: [place];  Service: General;  Laterality: Left;  . BREAST RECONSTRUCTION WITH  PLACEMENT OF TISSUE EXPANDER AND FLEX HD (ACELLULAR HYDRATED DERMIS) Left 12/23/2016  . BREAST RECONSTRUCTION WITH PLACEMENT OF TISSUE EXPANDER AND FLEX HD (ACELLULAR HYDRATED DERMIS) Left 12/23/2016   Procedure: IMMEDIATE LEFT BREAST RECONSTRUCTION WITH PLACEMENT OF TISSUE EXPANDER AND FLEX HD (ACELLULAR HYDRATED DERMIS);  Surgeon: Wallace Going, DO;  Location: [place];  Service: Plastics;  Laterality: Left;  . CESAREAN SECTION N/A 01/22/2013   Procedure: primary CESAREAN SECTION of baby boy at 2001;  Surgeon: Cheri Fowler, MD;  Location: [facility];  Service: Obstetrics;  Laterality: N/A;  . DILATION AND CURETTAGE OF UTERUS    . HERNIA REPAIR     UHR  . INDUCED ABORTION    . LAPAROSCOPIC CHOLECYSTECTOMY  Aug 2000  . MASTECTOMY Left 12/23/2016  . PARACENTESIS  07/17/2015   tube put in to drain fluid from near liver; US guided/notes 07/17/2015  . PORT-A-CATH REMOVAL Right 02/25/2017   Procedure: REMOVAL PORT-A-CATH;  Surgeon: Wallace Going, DO;  Location: [facility];  Service: Plastics;  Laterality: Right;  . PORTACATH PLACEMENT Right 07/24/2015   Procedure: INSERTION PORT-A-CATH RIGHT SUBCLAVIAN;  Surgeon: [name], MD;  Location: [facility];  Service: General;  Laterality: Right;  . RE-EXCISION OF BREAST LUMPECTOMY Left 11/09/2016   Procedure: RE-EXCISION OF BREAST LUMPECTOMY;  Surgeon: [name], MD;  Location: [place];  Service: General;  Laterality: Left;  . REMOVAL OF TISSUE EXPANDER AND PLACEMENT OF IMPLANT Left 02/25/2017   Procedure: REMOVAL OF LEFT BREAST  TISSUE EXPANDER AND PLACEMENT OF LEFT SILICONE IMPLANT;  Surgeon: Wallace Going, DO;  Location: [place];  Service: Plastics;  Laterality: Left;  . TOTAL MASTECTOMY Left 12/23/2016   Procedure: LEFT MASTECTOMY;  Surgeon: [name], MD;  Location: [place];  Service: General;  Laterality: Left;  . UMBILICAL HERNIA REPAIR  ~ 1983  . [place] EXTRACTION  2002     There were no vitals filed for this visit.       Subjective Assessment - 05/11/17 1610    Subjective "I've been having swelling in my left arm and hand since I had the first lumpectomy--I had two and then I had a mastectomy."   Pertinent History Left breast cancer diagnosed December 2016. Was told it was stage IV in January.  Had neo-adjuvant chemo.  Had had liver metastases, which showed complete resolution from chemo. followed by two lumpectomies and then mastectomy with immediate expander placed. (Lumpectomies approx. 10/25/16 and 11/09/16, mastectomy 12/2016 and implant surgery 02/2017.)  Had implant placed prior to radiation. Now doing radiation. XRT will finish 05/19/17. Had some edema between breasts, which seems mostly if not completely resolved.   Patient Stated Goals to reduce or get rid of the swelling   Currently in Pain? Yes   Pain Score 0-No pain  up to 5   Pain Location Finger (Comment which one)  all five   Pain Orientation Left;Mid  at PIPs   Pain Descriptors / Indicators Sore   Aggravating Factors  when swelling goes down, touching it (like when holding hands)   Pain Relieving Factors nothing squeezing it            [facility] PT Assessment - 05/11/17 0001      Assessment   Medical Diagnosis stage IV breast cancer   Referring Provider Dr. Kyung Rudd   Onset Date/Surgical Date 06/27/15  approx., for onset; most recent surgery approx. 02/24/17   Hand Dominance Right   Prior Therapy none     Precautions   Precautions Other (comment)   Precaution Comments cancer precautions     Restrictions   Weight Bearing Restrictions No     Balance Screen   Has the patient fallen in the past 6 months No   Has the patient had a decrease in activity level because of a fear of falling?  No   Is the patient reluctant to leave their home because of a fear of falling?  No     Home Ecologist residence   Living Arrangements Spouse/significant other    Type of [place] Two level     Prior Function   Level of Independence Independent   Vocation Other (comment)  hasn't worked in years   Leisure hasn't been able to with radiation; does some exercises in the bedroom with 2 lb. weights, Tai chi and yoga     Cognition   Overall Cognitive Status Within Functional Limits for tasks assessed     Observation/Other Assessments   Observations tendons and veins not visible on dorsum of left hand as on the right   Skin Integrity raw half-moon shape under left arm at axilla from radiation   Other Surveys  --  lymph life impact scale score is 15 = 22% impairment   Quick DASH  23     ROM / Strength   AROM / PROM / Strength AROM     AROM   AROM Assessment Site Shoulder  Right/Left Shoulder Right;Left   Right Shoulder Flexion 170 Degrees   Right Shoulder ABduction 195 Degrees   Left Shoulder Flexion 128 Degrees   Left Shoulder ABduction 120 Degrees     Ambulation/Gait   Ambulation/Gait Yes   Ambulation/Gait Assistance 7: Independent           LYMPHEDEMA/ONCOLOGY QUESTIONNAIRE - 05/11/17 1627      Type   Cancer Type left breast     Surgeries   Mastectomy Date 12/25/16  approx.   Lumpectomy Date 10/25/16  approx., and 11/09/16   Saline Implant Reconstruction Date 02/24/17  approx.   Number Lymph Nodes Removed 1  not cancerous, but had liver mets     Treatment   Past Chemotherapy Treatment Yes   Active Radiation Treatment Yes   Date --  should finish 05/19/17     What other symptoms do you have   Are you Having Heaviness or Tightness Yes   Are you having Pain Yes     Lymphedema Assessments   Lymphedema Assessments Upper extremities     Right Upper Extremity Lymphedema   10 cm Proximal to Olecranon Process 27.1 cm   Olecranon Process 24.1 cm   10 cm Proximal to Ulnar Styloid Process 20.2 cm   Just Proximal to Ulnar Styloid Process 14.6 cm   Across Hand at [employer] 18.9 cm   At [place] of  2nd Digit 5.5 cm     Left Upper Extremity Lymphedema   10 cm Proximal to Olecranon Process 28.8 cm   Olecranon Process 24.5 cm   10 cm Proximal to Ulnar Styloid Process 19.7 cm   Just Proximal to Ulnar Styloid Process 14.8 cm   Across Hand at [employer] 18.9 cm   At [place] of 2nd Digit 5.8 cm   Other says it is smaller today than other days; swelling tends to be worse in the morning in her left hand           Katina Dung - 05/11/17 0001    Open a tight or new jar Moderate difficulty   Do heavy household chores (wash walls, wash floors) Moderate difficulty   Carry a shopping bag or briefcase Moderate difficulty   Wash your back Mild difficulty   Use a knife to cut food No difficulty   Recreational activities in which you take some force or impact through your arm, shoulder, or hand (golf, hammering, tennis) No difficulty   During the past week, to what extent has your arm, shoulder or hand problem interfered with your normal social activities with family, friends, neighbors, or groups? Not at all   During the past week, to what extent has your arm, shoulder or hand problem limited your work or other regular daily activities Slightly   Arm, shoulder, or hand pain. Moderate   Tingling (pins and needles) in your arm, shoulder, or hand None   Difficulty Sleeping No difficulty   DASH Score 22.73 %      Objective measurements completed on examination: See above findings.                  PT Education - 05/11/17 1703    Education provided Yes   Education Details Gave [facility] lymphedema pamphlet, NLN risk reduction handout, [employer] Medical pump info, BSN lymphedema pamphlet; discussed treatment plan   Person(s) Educated Patient   Methods Explanation;Handout   Comprehension Need further instruction  [place] - 05/11/17 1712      CC Long Term Goal  #1   Title Pt. will be independent in performing self-manual lymph drainage.    Baseline No knowledge   Time 3   Period Weeks   Status New     CC Long Term Goal  #2   Title Pt. will be knowledgeable about lymphedema risk reduction practices.   Baseline No knowledge   Time 3   Period Weeks   Status New     CC Long Term Goal  #3   Title Pt. will have been fitted with compression sleeve and glove for day- and nighttime wear   Baseline she does not have these garments   Time 3   Period Weeks   Status New     CC Long Term Goal  #4   Title Pt. will be knowledgeable about how and where to obtain a lymphedema pump   Baseline no knowledge   Time 3   Period Weeks   Status New     CC Long Term Goal  #5   Title Pt. will be knowledgeable about HEP for left shoulder ROM.   Baseline limited shoulder AROM   Time 3   Period Weeks   Status New             Plan - 05/11/17 1705    Clinical Impression Statement This is a very pleasant younger woman with diagnosis of left breast cancer almost two years ago.  She had neo-adjuvant chemo, lumpectomies x 2 then mastectomy with immediate reconstruction expander and later implant placement; she is currently undergoing XRT for another week.  She has left shoulder ROM limitations, seemingly in part due to raw skin at left axilla from radiation. She has mild swelling in left arm; she reports swelling is less today than at other times, and that it does vary.     History and Personal Factors relevant to plan of care: none   Clinical Presentation Evolving   Clinical Presentation due to: just finishing radiation in the next week and dealing with skin issues from that   Clinical Decision Making Low   Rehab Potential Excellent   Clinical Impairments Affecting Rehab Potential current skin burn from radiation; should finish radiation 05/19/17   PT Frequency 1x / week   PT Duration 3 weeks   PT Treatment/Interventions ADLs/Self Care Home Management;DME Instruction;Therapeutic exercise;Patient/family education;Orthotic  Fit/Training;Manual techniques;Manual lymph drainage;Scar mobilization;Passive range of motion   PT Next Visit Plan Begin instruction in self-manual lymph drainage.  Go over options for day- and nighttime garments for her to be fitted by [facility].  Go over lymphedema risk reduction.   Recommended Other Services fitting by [facility] for custom daytime sleeve and glove and for nighttime compression; Flexitouch pump   Consulted and Agree with Plan of Care Patient      Patient will benefit from skilled therapeutic intervention in order to improve the following deficits and impairments:  Increased edema, Decreased knowledge of precautions, Decreased knowledge of use of DME, Decreased range of motion  Visit Diagnosis: Lymphedema, not elsewhere classified - Plan: PT plan of care cert/re-cert  Stiffness of left shoulder, not elsewhere classified - Plan: PT plan of care cert/re-cert     Problem List Patient Active Problem List   Diagnosis Date Noted  . Genetic testing 01/12/2017  . Breast cancer ([place]) 12/24/2016  . Acquired absence of breast and absent nipple, left 12/23/2016  . Hyponatremia 07/29/2015  .  Hyperkalemia 07/29/2015  . Transaminitis 07/29/2015  . Ascites, malignant   . Malignant ascites   . Port catheter in place   . Metastasis to liver ([place]) 07/24/2015  . Ascites 07/17/2015  . SBP (spontaneous bacterial peritonitis) ([place]) 07/17/2015  . Abdominal pain 07/17/2015  . SOB (shortness of breath) 07/17/2015  . DDD (degenerative disc disease), lumbar 07/17/2015  . Severe sepsis ([place])   . Breast cancer of upper-outer quadrant of left female breast ([place]) 07/11/2015    Abas Leicht 05/11/2017, 5:36 PM  [place], [place], [address] Phone: [phone]   Fax:  [phone]  Name: Dawn Haynes MRN: [id number] Date of Birth: 09/19/1980  Serafina Royals, PT 05/11/17 5:36 PM

## 2017-05-12 ENCOUNTER — Ambulatory Visit
Admission: RE | Admit: 2017-05-12 | Discharge: 2017-05-12 | Disposition: A | Discharge status: Home / Self Care | Payer: Medicaid Other | Source: Ambulatory Visit | Attending: Radiation Oncology | Admitting: Radiation Oncology

## 2017-05-12 DIAGNOSIS — Z510 Encounter for antineoplastic radiation therapy: Secondary | ICD-10-CM | POA: Diagnosis not present

## 2017-05-13 ENCOUNTER — Ambulatory Visit
Admission: RE | Admit: 2017-05-13 | Discharge: 2017-05-13 | Disposition: A | Discharge status: Home / Self Care | Payer: Medicaid Other | Source: Ambulatory Visit | Attending: Radiation Oncology | Admitting: Radiation Oncology

## 2017-05-13 ENCOUNTER — Ambulatory Visit: Payer: Medicaid Other

## 2017-05-13 DIAGNOSIS — Z510 Encounter for antineoplastic radiation therapy: Secondary | ICD-10-CM | POA: Diagnosis not present

## 2017-05-14 ENCOUNTER — Ambulatory Visit
Admission: RE | Admit: 2017-05-14 | Discharge: 2017-05-14 | Disposition: A | Discharge status: Home / Self Care | Payer: Medicaid Other | Source: Ambulatory Visit | Attending: Radiation Oncology | Admitting: Radiation Oncology

## 2017-05-14 ENCOUNTER — Ambulatory Visit: Payer: Medicaid Other

## 2017-05-14 DIAGNOSIS — Z510 Encounter for antineoplastic radiation therapy: Secondary | ICD-10-CM | POA: Diagnosis not present

## 2017-05-17 ENCOUNTER — Ambulatory Visit
Admission: RE | Admit: 2017-05-17 | Discharge: 2017-05-17 | Disposition: A | Discharge status: Home / Self Care | Payer: Medicaid Other | Source: Ambulatory Visit | Attending: Radiation Oncology | Admitting: Radiation Oncology

## 2017-05-17 DIAGNOSIS — Z510 Encounter for antineoplastic radiation therapy: Secondary | ICD-10-CM | POA: Diagnosis not present

## 2017-05-18 ENCOUNTER — Ambulatory Visit: Payer: Medicaid Other | Attending: Radiation Oncology

## 2017-05-18 ENCOUNTER — Ambulatory Visit
Admission: RE | Admit: 2017-05-18 | Discharge: 2017-05-18 | Disposition: A | Discharge status: Home / Self Care | Payer: Medicaid Other | Source: Ambulatory Visit | Attending: Radiation Oncology | Admitting: Radiation Oncology

## 2017-05-18 DIAGNOSIS — I890 Lymphedema, not elsewhere classified: Secondary | ICD-10-CM | POA: Diagnosis not present

## 2017-05-18 DIAGNOSIS — Z510 Encounter for antineoplastic radiation therapy: Secondary | ICD-10-CM | POA: Diagnosis not present

## 2017-05-18 NOTE — Therapy (Signed)
[place], [place], [address] Phone: [phone]   Fax:  [phone]  Physical Therapy Treatment  Patient Details  Name: Dawn Haynes MRN: [id number] Date of Birth: 07-Feb-1981 Referring Provider: Dr. Kyung Rudd   Encounter Date: 05/18/2017  PT End of Session - 05/18/17 0941    Visit Number  2    Number of Visits  4    Date for PT Re-Evaluation  06/11/17    PT Start Time  4270    PT Stop Time  0937    PT Time Calculation (min)  42 min    Activity Tolerance  Patient tolerated treatment well    Behavior During Therapy  [facility] for tasks assessed/performed       Past Medical History:  Diagnosis Date  . Breast cancer ([place]) 07/09/2015   left breast  . Breast cancer of upper-outer quadrant of left female breast ([place]) 07/11/2015  . DDD (degenerative disc disease), lumbar    L1-2  . Genetic testing 01/12/2017   Ms. Ramo underwent genetic counseling and testing for hereditary cancer syndromes on 01/05/2017. Her results were negative for mutations in all 46 genes analyzed by [employer]'s 46-gene Common Hereditary Cancers Panel. Genes analyzed include: APC, ATM, AXIN2, BARD1, BMPR1A, BRCA1, BRCA2, BRIP1, CDH1, CDKN2A, CHEK2, CTNNA1, DICER1, EPCAM, GREM1, HOXB13, KIT, MEN1, MLH1, MSH2, MSH3, MSH6, MUTYH, NBN,   . Genetic testing of female    [facility] panel negative 01/2017  . History of blood transfusion    "when I lost alot of blood when I went into premature labor"; no abnormal reaction noted  . Weakness    numbness and tingling in both hands    Past Surgical History:  Procedure Laterality Date  . BREAST BIOPSY Left 2018  . BREAST RECONSTRUCTION WITH PLACEMENT OF TISSUE EXPANDER AND FLEX HD (ACELLULAR HYDRATED DERMIS) Left 12/23/2016  . DILATION AND CURETTAGE OF UTERUS    . HERNIA REPAIR     UHR  . INDUCED ABORTION    . LAPAROSCOPIC CHOLECYSTECTOMY  Aug 2000  . MASTECTOMY Left 12/23/2016  . PARACENTESIS   07/17/2015   tube put in to drain fluid from near liver; US guided/notes 07/17/2015  . UMBILICAL HERNIA REPAIR  ~ 1983  . [place] EXTRACTION  2002    There were no vitals filed for this visit.  Subjective Assessment - 05/18/17 0905    Subjective  Nothing new since I was here last. My hand is swollen today like normal, the swelling has been more constant as of late. Not having any pain right now, my last 3 knuckles are just sore when I make a fist.    Pertinent History  Left breast cancer diagnosed December 2016. Was told it was stage IV in January.  Had neo-adjuvant chemo.  Had had liver metastases, which showed complete resolution from chemo. followed by two lumpectomies and then mastectomy with immediate expander placed. (Lumpectomies approx. 10/25/16 and 11/09/16, mastectomy 12/2016 and implant surgery 02/2017.)  Had implant placed prior to radiation. Now doing radiation. XRT will finish 05/19/17. Had some edema between breasts, which seems mostly if not completely resolved.    Patient Stated Goals  to reduce or get rid of the swelling    Currently in Pain?  No/denies                      [facility] Adult PT Treatment/Exercise - 05/18/17 0001      Manual Therapy   Manual Therapy  Manual Lymphatic Drainage (MLD)    Manual Lymphatic Drainage (MLD)  In Supine: Short neck, superfical and deep abdominals, Rt axillary nodes and and Lt inguinal nodes, then anterior inter-axillary and Lt axillo-inguinal anastomosis, then Lt UE from dorsal hand to lateral shoulder working from proximal to distal then retracing all steps beginning to instruct pt throughout.              PT Education - 05/18/17 0940    Education provided  Yes    Education Details  Self manual lymph drainage    Person(s) Educated  Patient    Methods  Explanation;Demonstration;Handout    Comprehension  Verbalized understanding;Returned demonstration;Need further instruction             [facility] Goals  - 05/11/17 1712      CC Long Term Goal  #1   Title  Pt. will be independent in performing self-manual lymph drainage.    Baseline  No knowledge    Time  3    Period  Weeks    Status  New      CC Long Term Goal  #2   Title  Pt. will be knowledgeable about lymphedema risk reduction practices.    Baseline  No knowledge    Time  3    Period  Weeks    Status  New      CC Long Term Goal  #3   Title  Pt. will have been fitted with compression sleeve and glove for day- and nighttime wear    Baseline  she does not have these garments    Time  3    Period  Weeks    Status  New      CC Long Term Goal  #4   Title  Pt. will be knowledgeable about how and where to obtain a lymphedema pump    Baseline  no knowledge    Time  3    Period  Weeks    Status  New      CC Long Term Goal  #5   Title  Pt. will be knowledgeable about HEP for left shoulder ROM.    Baseline  limited shoulder AROM    Time  3    Period  Weeks    Status  New         Plan - 05/18/17 0946    Clinical Impression Statement  Pt tolerated first session of education of basics of anatomy of lymphatic system well and instruction of manual lymph drainage returning good demonstration, though did not spend alot of time having pt return demo for first visit as wanted to put emphasis on pt learning/feeling correct technique. Also showed pt some compression garments for day and night. She has not heard from [facility] yet but has had pump demonstration.     Rehab Potential  Excellent    Clinical Impairments Affecting Rehab Potential  current skin burn from radiation; should finish radiation 05/19/17    PT Frequency  1x / week    PT Duration  3 weeks    PT Treatment/Interventions  ADLs/Self Care Home Management;DME Instruction;Therapeutic exercise;Patient/family education;Orthotic Fit/Training;Manual techniques;Manual lymph drainage;Scar mobilization;Passive range of motion    PT Next Visit Plan  Review self-manual lymph drainage  and instruct husband.  Review if needed options for day- and nighttime garments for her to be fitted by [facility].  Go over lymphedema risk reduction.    Consulted and Agree with Plan of Care  Patient       Patient will benefit from skilled therapeutic intervention in order to improve the following deficits and impairments:  Increased edema, Decreased knowledge of precautions, Decreased knowledge of use of DME, Decreased range of motion  Visit Diagnosis: Lymphedema, not elsewhere classified     Problem List Patient Active Problem List   Diagnosis Date Noted  . Genetic testing 01/12/2017  . Breast cancer ([place]) 12/24/2016  . Acquired absence of breast and absent nipple, left 12/23/2016  . Hyponatremia 07/29/2015  . Hyperkalemia 07/29/2015  . Transaminitis 07/29/2015  . Ascites, malignant   . Malignant ascites   . Port catheter in place   . Metastasis to liver ([place]) 07/24/2015  . Ascites 07/17/2015  . SBP (spontaneous bacterial peritonitis) ([place]) 07/17/2015  . Abdominal pain 07/17/2015  . SOB (shortness of breath) 07/17/2015  . DDD (degenerative disc disease), lumbar 07/17/2015  . Severe sepsis ([place])   . Breast cancer of upper-outer quadrant of left female breast ([place]) 07/11/2015    Otelia Limes, PTA 05/18/2017, 9:59 AM  [place], [place], [address] Phone: [phone]   Fax:  [phone]  Name: Dawn Haynes MRN: [id number] Date of Birth: [birth year]

## 2017-05-18 NOTE — Patient Instructions (Signed)
Start with circles near the neck above the collarbones 5 times.      Cancer Rehab [phone]  Deep Effective Breath   Standing, sitting, or laying down, place both hands on the belly. Take a deep breath IN, expanding the belly; then breath OUT, contracting the belly. Repeat __5__ times. Do __2-3__ sessions per day and before your self massage.  http://gt2.exer.us/866   Copyright  VHI. All rights reserved.  Axilla to Axilla - Sweep   On uninvolved side make 5 circles in the armpit, then pump _5__ times from involved armpit across chest to uninvolved armpit, making a pathway. Do _1__ time per day.  Copyright  VHI. All rights reserved.  Axilla to Inguinal Nodes - Sweep   On involved side, make 5 circles at groin at panty line, then pump _5__ times from armpit along side of trunk to outer hip, making your other pathway. Do __1_ time per day.  Copyright  VHI. All rights reserved.  Arm Posterior: Elbow to Shoulder - Sweep   Pump _5__ times from back of elbow to top of shoulder. Then inner to outer upper arm _5_ times, then outer arm again _5_ times. Then back to the pathways _2-3_ times. Do _1__ time per day.  Copyright  VHI. All rights reserved.  ARM: [place] Wrist to Elbow - Sweep   Pump or stationary circles _5__ times from wrist to elbow making sure to do both sides of the forearm. Then retrace your steps to the outer arm, and the pathways _2-3_ times each. Do _1__ time per day.  Copyright  VHI. All rights reserved.  ARM: Dorsum of Hand to Shoulder - Sweep   Pump or stationary circles _5__ times on back of hand including knuckle spaces and individual fingers if needed working up towards the wrist, then retrace all your steps working back up the forearm, doing both sides; upper outer arm and back to your pathways _2-3_ times each. Then do 5 circles again at uninvolved armpit and involved groin where you started! Good job!! Do __1_ time per day.  Copyright  VHI. All rights  reserved.

## 2017-05-19 ENCOUNTER — Ambulatory Visit: Payer: Medicaid Other

## 2017-05-19 ENCOUNTER — Ambulatory Visit
Admission: RE | Admit: 2017-05-19 | Discharge: 2017-05-19 | Disposition: A | Discharge status: Home / Self Care | Payer: Medicaid Other | Source: Ambulatory Visit | Attending: Radiation Oncology | Admitting: Radiation Oncology

## 2017-05-19 ENCOUNTER — Encounter: Payer: Self-pay | Admitting: Radiation Oncology

## 2017-05-19 DIAGNOSIS — Z510 Encounter for antineoplastic radiation therapy: Secondary | ICD-10-CM | POA: Diagnosis not present

## 2017-05-20 ENCOUNTER — Ambulatory Visit: Payer: Medicaid Other

## 2017-05-25 ENCOUNTER — Ambulatory Visit: Payer: Medicaid Other

## 2017-05-25 DIAGNOSIS — I890 Lymphedema, not elsewhere classified: Secondary | ICD-10-CM | POA: Diagnosis not present

## 2017-05-25 NOTE — Therapy (Addendum)
[place], [place], [address] Phone: [phone]   Fax:  [phone]  Physical Therapy Treatment  Patient Details  Name: Dawn Haynes MRN: [id number] Date of Birth: [birth year] Referring Provider: Dr. Kyung Rudd   Encounter Date: 05/25/2017  PT End of Session - 05/25/17 0934    Visit Number  3    Number of Visits  4    Date for PT Re-Evaluation  06/11/17    PT Start Time  0845    PT Stop Time  0932    PT Time Calculation (min)  47 min    Activity Tolerance  Patient tolerated treatment well    Behavior During Therapy  [facility] for tasks assessed/performed       Past Medical History:  Diagnosis Date  . Breast cancer ([place]) 07/09/2015   left breast  . Breast cancer of upper-outer quadrant of left female breast ([place]) 07/11/2015  . DDD (degenerative disc disease), lumbar    L1-2  . Genetic testing 01/12/2017   Ms. Toole underwent genetic counseling and testing for hereditary cancer syndromes on 01/05/2017. Her results were negative for mutations in all 46 genes analyzed by [employer]'s 46-gene Common Hereditary Cancers Panel. Genes analyzed include: APC, ATM, AXIN2, BARD1, BMPR1A, BRCA1, BRCA2, BRIP1, CDH1, CDKN2A, CHEK2, CTNNA1, DICER1, EPCAM, GREM1, HOXB13, KIT, MEN1, MLH1, MSH2, MSH3, MSH6, MUTYH, NBN,   . Genetic testing of female    [employer] panel negative 01/2017  . History of blood transfusion    "when I lost alot of blood when I went into premature labor"; no abnormal reaction noted  . Weakness    numbness and tingling in both hands    Past Surgical History:  Procedure Laterality Date  . BREAST BIOPSY Left 2018  . BREAST RECONSTRUCTION WITH PLACEMENT OF TISSUE EXPANDER AND FLEX HD (ACELLULAR HYDRATED DERMIS) Left 12/23/2016  . DILATION AND CURETTAGE OF UTERUS    . HERNIA REPAIR     UHR  . INDUCED ABORTION    . LAPAROSCOPIC CHOLECYSTECTOMY  Aug 2000  . MASTECTOMY Left 12/23/2016  . PARACENTESIS   07/17/2015   tube put in to drain fluid from near liver; US guided/notes 07/17/2015  . UMBILICAL HERNIA REPAIR  ~ 1983  . [place] EXTRACTION  2002    There were no vitals filed for this visit.  Subjective Assessment - 05/25/17 0849    Subjective  I haven't got to try the massage since I was here last but I brought my husband and he's going to learn too so we canboth do it. My swelling has been much better though since I was here last. Only in my fingers now. Finished radiation last Wednesday!    Pertinent History  Left breast cancer diagnosed December 2016. Was told it was stage IV in January.  Had neo-adjuvant chemo.  Had had liver metastases, which showed complete resolution from chemo. followed by two lumpectomies and then mastectomy with immediate expander placed. (Lumpectomies approx. 10/25/16 and 11/09/16, mastectomy 12/2016 and implant surgery 02/2017.)  Had implant placed prior to radiation. Now doing radiation. XRT will finish 05/19/17. Had some edema between breasts, which seems mostly if not completely resolved.    Patient Stated Goals  to reduce or get rid of the swelling    Currently in Pain?  No/denies                      [facility] Adult PT Treatment/Exercise - 05/25/17 0001  Manual Therapy   Manual Therapy  Manual Lymphatic Drainage (MLD)    Manual Lymphatic Drainage (MLD)  In Supine: Short neck, superfical and deep abdominals, Rt axillary nodes and and Lt inguinal nodes, then anterior inter-axillary and Lt axillo-inguinal anastomosis, then Lt UE from dorsal hand to lateral shoulder working from proximal to distal then retracing all steps reviewing with pt throughout and instructing husband, also in posterior inter-axillary anastomosis in Rt S/L.                     [place] - 05/11/17 1712      CC Long Term Goal  #1   Title  Pt. will be independent in performing self-manual lymph drainage.    Baseline  No knowledge    Time  3     Period  Weeks    Status  New      CC Long Term Goal  #2   Title  Pt. will be knowledgeable about lymphedema risk reduction practices.    Baseline  No knowledge    Time  3    Period  Weeks    Status  New      CC Long Term Goal  #3   Title  Pt. will have been fitted with compression sleeve and glove for day- and nighttime wear    Baseline  she does not have these garments    Time  3    Period  Weeks    Status  New      CC Long Term Goal  #4   Title  Pt. will be knowledgeable about how and where to obtain a lymphedema pump    Baseline  no knowledge    Time  3    Period  Weeks    Status  New      CC Long Term Goal  #5   Title  Pt. will be knowledgeable about HEP for left shoulder ROM.    Baseline  limited shoulder AROM    Time  3    Period  Weeks    Status  New         Plan - 05/25/17 0935    Clinical Impression Statement  Pt was measured for her compression garments yesterday. She reports her sweling has felt better since last week. Instructed husband in massage today as well as reviewing with pt. He returned good demonstration of this and pt verbalized good understanding of sequence today.     Rehab Potential  Excellent    Clinical Impairments Affecting Rehab Potential  current skin burn from radiation; should finish radiation 05/19/17    PT Frequency  1x / week    PT Duration  3 weeks    PT Treatment/Interventions  ADLs/Self Care Home Management;DME Instruction;Therapeutic exercise;Patient/family education;Orthotic Fit/Training;Manual techniques;Manual lymph drainage;Scar mobilization;Passive range of motion    PT Next Visit Plan  D/C next. Review self-manual lymph drainage with pt and husband, having her perform, husband if needed. Go over lymphedema risk reduction.    Consulted and Agree with Plan of Care  Patient       Patient will benefit from skilled therapeutic intervention in order to improve the following deficits and impairments:  Increased edema, Decreased  knowledge of precautions, Decreased knowledge of use of DME, Decreased range of motion  Visit Diagnosis: Lymphedema, not elsewhere classified     Problem List Patient Active Problem List   Diagnosis Date Noted  . Genetic testing 01/12/2017  .  Breast cancer ([place]) 12/24/2016  . Acquired absence of breast and absent nipple, left 12/23/2016  . Hyponatremia 07/29/2015  . Hyperkalemia 07/29/2015  . Transaminitis 07/29/2015  . Ascites, malignant   . Malignant ascites   . Port catheter in place   . Metastasis to liver ([place]) 07/24/2015  . Ascites 07/17/2015  . SBP (spontaneous bacterial peritonitis) ([place]) 07/17/2015  . Abdominal pain 07/17/2015  . SOB (shortness of breath) 07/17/2015  . DDD (degenerative disc disease), lumbar 07/17/2015  . Severe sepsis ([place])   . Breast cancer of upper-outer quadrant of left female breast ([place]) 07/11/2015    Otelia Limes, PTA 05/25/2017, 9:37 AM  [place], [place], [address]      Phone: [phone]   Fax:  [phone]  Name: Dawn Haynes MRN: [id number] Date of Birth: September 25, 1980  PHYSICAL THERAPY DISCHARGE SUMMARY  Visits from Start of Care: 3  Current functional level related to goals / functional outcomes: Goals partially met as noted above.   Remaining deficits: We did not get her independent with a home exercise program for shoulder ROM.   Education / Equipment: Self-manual lymph drainage instruction, compression garment acquisition, lymphedema pump demonstration. Plan: Patient agrees to discharge.  Patient goals were partially met. Patient is being discharged due to not returning since the last visit.  ?????    The plan was for one more visit, but she did not come to that follow-up.  Serafina Royals, PT 09/09/17 3:56 PM

## 2017-05-28 NOTE — Progress Notes (Signed)
  Radiation Oncology         [phone] ________________________________  Name: Dawn Haynes MRN: [id number]  Date: 05/19/2017  DOB: May 01, 1981  End of Treatment Note  Diagnosis:   Left-sided breast cancer     Indication for treatment:  Curative       Radiation treatment dates:   04/05/2017 - 05/19/2017  Site/dose:   The patient initially received a dose of 50.4 Gy in 28 fractions to the left chest wall using whole-breast tangent fields. This was delivered using a 3-D conformal technique. The patient then received a boost to the seroma. This delivered an additional 10 Gy in 5 fractions using a 3-D technique. The total dose was 60.4 Gy.  Narrative: The patient tolerated radiation treatment relatively well.   The patient had some expected skin irritation as she progressed during treatment. Moist desquamation was present at the end of treatment. No infection noted. She applied aloe gel to the area.  Plan: The patient has completed radiation treatment. The patient will return to radiation oncology clinic for routine followup in one month. I advised the patient to call or return sooner if they have any questions or concerns related to their recovery or treatment. ________________________________  ------------------------------------------------  Jodelle Gross, MD, PhD  This document serves as a record of services personally performed by Kyung Rudd, MD. It was created on his behalf by Rae Lips, a trained medical scribe. The creation of this record is based on the scribe's personal observations and the provider's statements to them. This document has been checked and approved by the attending provider.

## 2017-06-01 ENCOUNTER — Other Ambulatory Visit: Payer: Self-pay

## 2017-06-01 ENCOUNTER — Encounter (HOSPITAL_COMMUNITY): Payer: Self-pay | Admitting: *Deleted

## 2017-06-01 ENCOUNTER — Emergency Department (HOSPITAL_COMMUNITY)
Admission: EM | Admit: 2017-06-01 | Discharge: 2017-06-01 | Disposition: A | Discharge status: Home / Self Care | Payer: Medicaid Other | Attending: Emergency Medicine | Admitting: Emergency Medicine

## 2017-06-01 ENCOUNTER — Ambulatory Visit: Payer: Medicaid Other

## 2017-06-01 DIAGNOSIS — Z5321 Procedure and treatment not carried out due to patient leaving prior to being seen by health care provider: Secondary | ICD-10-CM | POA: Diagnosis not present

## 2017-06-01 DIAGNOSIS — H5711 Ocular pain, right eye: Secondary | ICD-10-CM | POA: Insufficient documentation

## 2017-06-01 MED ORDER — TETRACAINE HCL 0.5 % OP SOLN
1.0000 [drp] | Freq: Once | OPHTHALMIC | Status: DC
Start: 2017-06-01 — End: 2017-06-01
  Filled 2017-06-01: qty 4

## 2017-06-01 MED ORDER — FLUORESCEIN SODIUM 1 MG OP STRP
1.0000 | ORAL_STRIP | Freq: Once | OPHTHALMIC | Status: DC
  Filled 2017-06-01: qty 1

## 2017-06-01 NOTE — ED Triage Notes (Signed)
States she walked outside yest and flet like something flew into her right eye. States it still feels irritated.

## 2017-07-07 ENCOUNTER — Encounter: Payer: Self-pay | Admitting: Radiation Oncology

## 2017-07-07 ENCOUNTER — Other Ambulatory Visit: Payer: Self-pay

## 2017-07-07 ENCOUNTER — Ambulatory Visit
Admission: RE | Admit: 2017-07-07 | Discharge: 2017-07-07 | Disposition: A | Discharge status: Home / Self Care | Payer: Medicaid Other | Source: Ambulatory Visit | Attending: Radiation Oncology | Admitting: Radiation Oncology

## 2017-07-07 VITALS — BP 108/66 | HR 90 | Temp 98.4°F | Resp 18 | Ht 63.0 in | Wt 140.0 lb

## 2017-07-07 DIAGNOSIS — Z171 Estrogen receptor negative status [ER-]: Secondary | ICD-10-CM | POA: Insufficient documentation

## 2017-07-07 DIAGNOSIS — C50412 Malignant neoplasm of upper-outer quadrant of left female breast: Secondary | ICD-10-CM | POA: Insufficient documentation

## 2017-07-07 DIAGNOSIS — Z510 Encounter for antineoplastic radiation therapy: Secondary | ICD-10-CM | POA: Diagnosis not present

## 2017-07-07 NOTE — Progress Notes (Signed)
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 This was delivered using a 3-D conformal technique. The patient then received a boost to the seroma. This delivered an additional 10 Gy in 5 fractions using a 3-D technique. The total dose was 60.4 Gy.   Narrative:  The patient returns today for routine follow-up. During treatment she did very well with radiotherapy and did not have significant desquamation, but did develop significant hyperpigmentation.                             On review of systems, the patient states she's feeling well. She is hopeful that her PET scan will be clear when she goes next month. She denies any concerns with her skin other than that she has persistent hyperpigmentation. No other complaints are noted.  ALLERGIES:  is allergic to no known allergies.  Meds: Current Outpatient Medications  Medication Sig Dispense Refill  . Ascorbic Acid (VITAMIN C) 1000 MG tablet Take 1,000 mg by mouth 2 (two) times daily. IN THE MORNING & IN THE AFTERNOON    . Multiple Vitamin (MULTIVITAMIN) tablet Take 1 tablet by mouth daily.     No current facility-administered medications for this encounter.     Physical Findings:  height is 5\' 3"  (1.6 m) and weight is 140 lb (63.5 kg). Her oral temperature is 98.4 F (36.9 C). Her blood pressure is 108/66 and her pulse is 90. Her respiration is 18 and oxygen saturation is 100%.  Pain Assessment Pain Score: 0-No pain/10 In general this is a well  appearing African american in no acute distress. She's alert and oriented x4 and appropriate throughout the examination. Cardiopulmonary assessment is negative for acute distress and she exhibits normal effort. The left chest wall was examined and reveals an in situ tissue expander. No desquamation is noted but the radiation field has left residual hyperpigmentation. No edema is noted, but the patient mentions that she continues with manual lymphatic massage per PT if this occurs.    Lab Findings: Lab Results  Component Value Date   WBC 3.7 (L) 01/05/2017   HGB 11.7 01/05/2017   HCT 35.7 01/05/2017   MCV 92.5 01/05/2017   PLT 196 01/05/2017     Radiographic Findings: No results found.  Impression/Plan: 1.   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 The patient has been doing well since completion of radiotherapy. We discussed that we would be happy to continue to follow her as needed, but she will also continue to follow up with Dr. [name] in medical oncology. She was counseled on skin care as well as measures to avoid sun exposure to this area.  2. Survivorship. The patient will likely proceed with survivorship clinic if her clinical picture remains stable in remission. She was given information as well for the "After Breast Cancer Class" that meets as well.  3. Chest wall  lymphedema. This was not seen on exam today but she continues her lymphatic massage at home.       Carola Rhine, PAC

## 2017-07-07 NOTE — Addendum Note (Signed)
Encounter addended by: Malena Edman, RN on: 07/07/2017 3:12 PM  Actions taken: Charge Capture section accepted

## 2017-07-19 ENCOUNTER — Other Ambulatory Visit: Payer: Self-pay

## 2017-07-19 ENCOUNTER — Other Ambulatory Visit: Payer: Self-pay | Admitting: Adult Health

## 2017-07-19 DIAGNOSIS — C50412 Malignant neoplasm of upper-outer quadrant of left female breast: Secondary | ICD-10-CM

## 2017-07-19 DIAGNOSIS — C787 Secondary malignant neoplasm of liver and intrahepatic bile duct: Secondary | ICD-10-CM

## 2017-07-19 DIAGNOSIS — Z171 Estrogen receptor negative status [ER-]: Principal | ICD-10-CM

## 2017-07-19 NOTE — Progress Notes (Signed)
Called pt to notify her of CT scan schedule and appt changes. Pt to have CT chest, abdomen and pelvis done this Friday 07/23/17. Pt will be NPO for 4hrs prior to scan. Will need prepped at 1130am and 1230pm with oral contrast. Added on a lab appt at the cancer center for 1015am prior to her scan in the afternoon. Pt verbally confirmed all instructions and time/date of appt. No further needs at this time.

## 2017-07-23 ENCOUNTER — Ambulatory Visit (HOSPITAL_COMMUNITY)
Admission: RE | Admit: 2017-07-23 | Discharge: 2017-07-23 | Disposition: A | Discharge status: Home / Self Care | Payer: Medicaid Other | Source: Ambulatory Visit | Attending: Adult Health | Admitting: Adult Health

## 2017-07-23 ENCOUNTER — Inpatient Hospital Stay: Payer: Medicaid Other | Attending: Hematology and Oncology

## 2017-07-23 DIAGNOSIS — Z79899 Other long term (current) drug therapy: Secondary | ICD-10-CM | POA: Diagnosis not present

## 2017-07-23 DIAGNOSIS — R918 Other nonspecific abnormal finding of lung field: Secondary | ICD-10-CM | POA: Diagnosis not present

## 2017-07-23 DIAGNOSIS — E3121 Multiple endocrine neoplasia [MEN] type I: Secondary | ICD-10-CM | POA: Insufficient documentation

## 2017-07-23 DIAGNOSIS — C50412 Malignant neoplasm of upper-outer quadrant of left female breast: Secondary | ICD-10-CM

## 2017-07-23 DIAGNOSIS — I878 Other specified disorders of veins: Secondary | ICD-10-CM | POA: Diagnosis not present

## 2017-07-23 DIAGNOSIS — Z171 Estrogen receptor negative status [ER-]: Secondary | ICD-10-CM | POA: Diagnosis present

## 2017-07-23 DIAGNOSIS — Z9221 Personal history of antineoplastic chemotherapy: Secondary | ICD-10-CM | POA: Diagnosis not present

## 2017-07-23 DIAGNOSIS — C787 Secondary malignant neoplasm of liver and intrahepatic bile duct: Secondary | ICD-10-CM | POA: Insufficient documentation

## 2017-07-23 LAB — CMP (CANCER CENTER ONLY)
ALK PHOS: 63 U/L (ref 40–150)
ALT: 18 U/L (ref 0–55)
AST: 23 U/L (ref 5–34)
Albumin: 4.1 g/dL (ref 3.5–5.0)
Anion gap: 10 (ref 3–11)
BUN: 12 mg/dL (ref 7–26)
CALCIUM: 9.5 mg/dL (ref 8.4–10.4)
CHLORIDE: 105 mmol/L (ref 98–109)
CO2: 26 mmol/L (ref 22–29)
CREATININE: 0.96 mg/dL (ref 0.60–1.10)
GFR, Estimated: >60 mL/min (ref >60)
Glucose, Bld: 100 mg/dL (ref 70–140)
Potassium: 3.6 mmol/L (ref 3.3–4.7)
SODIUM: 141 mmol/L (ref 136–145)
Total Bilirubin: 0.6 mg/dL (ref 0.2–1.2)
Total Protein: 7.4 g/dL (ref 6.4–8.3)

## 2017-07-23 MED ORDER — IOPAMIDOL (ISOVUE-300) INJECTION 61%
30.0000 mL | Freq: Once | INTRAVENOUS | Status: AC | PRN
  Administered 2017-07-23: 30 mL via INTRAVENOUS

## 2017-07-23 MED ORDER — IOPAMIDOL (ISOVUE-300) INJECTION 61%
100.0000 mL | Freq: Once | INTRAVENOUS | Status: AC | PRN
  Administered 2017-07-23: 100 mL via INTRAVENOUS

## 2017-07-23 MED ORDER — IOPAMIDOL (ISOVUE-300) INJECTION 61%
INTRAVENOUS | Status: AC
  Administered 2017-07-23: 30 mL via ORAL
  Filled 2017-07-23: qty 30

## 2017-07-23 MED ORDER — IOPAMIDOL (ISOVUE-300) INJECTION 61%
INTRAVENOUS | Status: AC
  Filled 2017-07-23: qty 100

## 2017-07-30 ENCOUNTER — Encounter: Payer: Self-pay | Admitting: Hematology and Oncology

## 2017-07-30 ENCOUNTER — Ambulatory Visit (HOSPITAL_COMMUNITY): Payer: Medicaid Other

## 2017-08-02 NOTE — Assessment & Plan Note (Signed)
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, Ki-67 90%;  left axillary lymph node biopsy: IDC grade 3 completely replacing the lymph node ER PR 0%, HER-2 negative ratio 1.22 Ki-67 90%  Left breast mass 5.3 x 5.2 x 2.7 cm at 1:30 position, multiple abnormal enlarged left axillary lymph nodes  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 Palliative chemotherapy with carboplatin and gemcitabine started 07/24/2015 days 1 and 8 every 3 weeks 6 cycles, maintenance gemcitabine alone started 06/02/2017discontinued November 2017 2. 12/23/2016 Status post lumpectomy and reexcision with positive margins 2; required mastectomy; IDC with DCIS grade 3, 6 cm (on prior lumpectomies there were 3.5 cm, 3.3 cm and 2.1 cm tumors); previously SLN negative ----------------------------------------------------------------------- Plan: Continued surveillance   CT CAP: 07/23/17: Significant decrease in the size of treated hepatic metastatic lesions. It is not certain whetherthe hypoattenuated lesions seen today represent active metastatic disease or post treatment Scarring. Prominent bilateral gonadal veins with evidence of pelvic venous congestion.  RTC in 3 months. Scans in 6 months  Bulky appearance of the uterus.

## 2017-08-03 ENCOUNTER — Inpatient Hospital Stay (HOSPITAL_BASED_OUTPATIENT_CLINIC_OR_DEPARTMENT_OTHER): Payer: Medicaid Other | Admitting: Hematology and Oncology

## 2017-08-03 ENCOUNTER — Telehealth: Payer: Self-pay | Admitting: Hematology and Oncology

## 2017-08-03 DIAGNOSIS — Z171 Estrogen receptor negative status [ER-]: Secondary | ICD-10-CM | POA: Diagnosis not present

## 2017-08-03 DIAGNOSIS — R918 Other nonspecific abnormal finding of lung field: Secondary | ICD-10-CM

## 2017-08-03 DIAGNOSIS — Z79899 Other long term (current) drug therapy: Secondary | ICD-10-CM | POA: Diagnosis not present

## 2017-08-03 DIAGNOSIS — Z9221 Personal history of antineoplastic chemotherapy: Secondary | ICD-10-CM | POA: Diagnosis not present

## 2017-08-03 DIAGNOSIS — C787 Secondary malignant neoplasm of liver and intrahepatic bile duct: Secondary | ICD-10-CM | POA: Diagnosis not present

## 2017-08-03 DIAGNOSIS — C50412 Malignant neoplasm of upper-outer quadrant of left female breast: Secondary | ICD-10-CM | POA: Diagnosis not present

## 2017-08-03 NOTE — Progress Notes (Signed)
Patient Care Team: Dawn Haynes, [place] as PCP - General (Family Medicine) Dawn Lose, MD as Consulting Physician (Hematology and Oncology) Dawn Rudd, MD as Consulting Physician (Radiation Oncology)  DIAGNOSIS:  Encounter Diagnosis  Name Primary?  . Malignant neoplasm of upper-outer quadrant of left breast in female, estrogen receptor negative (Dawn Haynes)     SUMMARY OF ONCOLOGIC HISTORY:   Breast cancer of upper-outer quadrant of left female breast (Dawn Haynes)   07/02/2015 Mammogram    Left breast mass 5.3 x 5.2 x 2.7 cm at 1:30 position, multiple abnormal enlarged left axillary lymph nodes      07/09/2015 Initial Diagnosis    Left breast biopsy 1:30: IDC grade 3, ER PR 0%, HER-2 negative ratio 1.65, Ki-67 90%; left axillary lymph node biopsy: IDC grade 3 completely replacing the lymph node ER PR 0%, HER-2 negative ratio 1.22 Ki-67 90%      07/25/2015 -  Chemotherapy    Palliative chemotherapy: Carboplatin and gemcitabine day 1 and day 8 every 3 weeks ( metastatic breast cancer with extensive liver metastases and malignant recurrent ascites), switched to gemcitabine every 2 weeks      12/02/2015 Imaging    CT CAP: Decrease in the liver metastases now measuring 7 x 5 cm was previously 8.4 x 9.3 cm.      03/05/2016 Imaging    CT CAP: Interval decrease in the heterogeneous posterior right lower lobe mass from 6.5 x 6.1 cm to 6.2 x 4.8 cm      12/23/2016 Surgery    Status post lumpectomy and reexcision with positive margins 2; required mastectomy; IDC with DCIS grade 3, 6 cm (on prior lumpectomies there were 3.5 cm, 3.3 cm and 2.1 cm tumors); previously SLN negative       Breast cancer (Dawn Haynes)   12/24/2016 Initial Diagnosis    Breast cancer (Dawn Haynes)      01/10/2017 Genetic Testing    Genetic counseling and testing for hereditary cancer syndromes were performed on 01/05/2017. Results are negative for pathogenic mutations in 46 genes analyzed by [facility] Common Hereditary Cancers  Panel. Results are dated 01/10/2017. Genes tested: APC, ATM, AXIN2, BARD1, BMPR1A, BRCA1, BRCA2, BRIP1, CDH1, CDKN2A, CHEK2, CTNNA1, Dawn Haynes, EPCAM, GREM1, HOXB13, KIT, MEN1, MLH1, MSH2, MSH3, MSH6, MUTYH, NBN, NF1, NTHL1, PALB2, PDGFRA, PMS2, POLD1, POLE, PTEN, RAD50, RAD51C, RAD51D, SDHA, SDHB, SDHC, SDHD, SMAD4, SMARCA4, STK11, TP53, TSC1, TSC2, and VHL.           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  She is currently off therapy and appears to be doing quite well.  Recent CT scans show continued improvement in her liver metastases.  We are not giving her any systemic therapy for these at this time.  She denies any nausea vomiting.  Energy levels are excellent.  She has not noticed any increased swelling of her abdomen.  REVIEW OF SYSTEMS:   Constitutional: Denies fevers, chills or abnormal weight loss Eyes: Denies blurriness of vision Ears, nose, mouth, throat, and face: Denies mucositis or sore throat Respiratory: Denies cough, dyspnea or wheezes Cardiovascular: Denies palpitation, chest discomfort Gastrointestinal:  Denies nausea, heartburn or change in bowel habits Skin: Denies abnormal skin rashes Lymphatics: Denies new lymphadenopathy or easy bruising Neurological:Denies numbness, tingling or new weaknesses Behavioral/Psych: Mood is stable, no new changes  Extremities: No lower extremity edema Breast:  denies  any pain or lumps or nodules in either breasts All other systems were reviewed with the patient and are negative.  I have reviewed the past medical history, past surgical history, social history and family history with the patient and they are unchanged from previous note.  ALLERGIES:  is allergic to no known allergies.  MEDICATIONS:  Current Outpatient Medications   Medication Sig Dispense Refill  . Ascorbic Acid (VITAMIN C) 1000 MG tablet Take 1,000 mg by mouth 2 (two) times daily. IN THE MORNING & IN THE AFTERNOON    . Multiple Vitamin (MULTIVITAMIN) tablet Take 1 tablet by mouth daily.     No current facility-administered medications for this visit.     PHYSICAL EXAMINATION: ECOG PERFORMANCE STATUS: 0 - Asymptomatic  Vitals:   08/03/17 1002  BP: 127/74  Pulse: 76  Resp: 18  Temp: 98.4 F (36.9 C)  SpO2: 100%   Filed Weights   08/03/17 1002  Weight: 138 lb 14.4 oz (63 kg)    GENERAL:alert, no distress and comfortable SKIN: skin color, texture, turgor are normal, no rashes or significant lesions EYES: normal, Conjunctiva are pink and non-injected, sclera clear OROPHARYNX:no exudate, no erythema and lips, buccal mucosa, and tongue normal  NECK: supple, thyroid normal size, non-tender, without nodularity LYMPH:  no palpable lymphadenopathy in the cervical, axillary or inguinal LUNGS: clear to auscultation and percussion with normal breathing effort HEART: regular rate & rhythm and no murmurs and no lower extremity edema ABDOMEN:abdomen soft, non-tender and normal bowel sounds MUSCULOSKELETAL:no cyanosis of digits and no clubbing  NEURO: alert & oriented x 3 with fluent speech, no focal motor/sensory deficits EXTREMITIES: No lower extremity edema BREAST: No palpable masses or nodules in either right or left breasts. No palpable axillary supraclavicular or infraclavicular adenopathy no breast tenderness or nipple discharge. (exam performed in the presence of a chaperone)  LABORATORY DATA:  I have reviewed the data as listed CMP Latest Ref Rng & Units 07/23/2017 01/05/2017 12/15/2016  Glucose 70 - 140 mg/dL 100 83 78  BUN 7 - 26 mg/dL 12 10.6 9  Creatinine 0.6 - 1.1 mg/dL - 0.9 0.91  Sodium 136 - 145 mmol/L 141 140 138  Potassium 3.3 - 4.7 mmol/L 3.6 4.3 4.1  Chloride 98 - 109 mmol/L 105 - 106  CO2 22 - 29 mmol/L '26 25 25  '$ Calcium 8.4 -  10.4 mg/dL 9.5 9.5 9.2  Total Protein 6.4 - 8.3 g/dL 7.4 7.3 -  Total Bilirubin 0.2 - 1.2 mg/dL 0.6 0.58 -  Alkaline Phos 40 - 150 U/L 63 67 -  AST 5 - 34 U/L 23 23 -  ALT 0 - 55 U/L 18 18 -    Lab Results  Component Value Date   WBC 3.7 (L) 01/05/2017   HGB 11.7 01/05/2017   HCT 35.7 01/05/2017   MCV 92.5 01/05/2017   PLT 196 01/05/2017   NEUTROABS 1.6 01/05/2017    ASSESSMENT & PLAN:  Breast cancer of upper-outer quadrant of left female breast ([place]) 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, Ki-67 90%;  left axillary lymph node biopsy: IDC grade 3 completely replacing the lymph node ER PR 0%, HER-2 negative ratio 1.22 Ki-67 90%  Left breast mass 5.3 x 5.2 x 2.7 cm at 1:30 position, multiple abnormal enlarged left axillary lymph nodes  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 Palliative chemotherapy with carboplatin and gemcitabine started 07/24/2015 days  1 and 8 every 3 weeks 6 cycles, maintenance gemcitabine alone started 06/02/2017discontinued November 2017 2. 12/23/2016 Status post lumpectomy and reexcision with positive margins 2; required mastectomy; IDC with DCIS grade 3, 6 cm (on prior lumpectomies there were 3.5 cm, 3.3 cm and 2.1 cm tumors); previously SLN negative ----------------------------------------------------------------------- Plan: Continued surveillance   CT CAP: 07/23/17: Significant decrease in the size of treated hepatic metastatic lesions. It is not certain whetherthe hypoattenuated lesions seen today represent active metastatic disease or post treatment Scarring. Prominent bilateral gonadal veins with evidence of pelvic venous congestion.  RTC in 1 year with scans and follow-up Patient tells me that she attributes her great success to tumor rec, healthy eating, organic food and positive attitude  Bulky appearance of the uterus.:  Sees gynecology   I  spent 25 minutes talking to the patient of which more than half was spent in counseling and coordination of care.  Orders Placed This Encounter  Procedures  . CT Abdomen Pelvis W Contrast    Standing Status:   Future    Standing Expiration Date:   08/03/2018    Order Specific Question:   If indicated for the ordered procedure, I authorize the administration of contrast media per Radiology protocol    Answer:   Yes    Order Specific Question:   Is patient pregnant?    Answer:   No    Order Specific Question:   Preferred imaging location?    Answer:   [facility]    Order Specific Question:   Radiology Contrast Protocol - do NOT remove file path    Answer:   [email or web address]    Order Specific Question:   Reason for Exam additional comments    Answer:   Metastatic breast cancer restaging  . CT Chest W Contrast    Standing Status:   Future    Standing Expiration Date:   08/03/2018    Order Specific Question:   If indicated for the ordered procedure, I authorize the administration of contrast media per Radiology protocol    Answer:   Yes    Order Specific Question:   Is patient pregnant?    Answer:   No    Order Specific Question:   Preferred imaging location?    Answer:   [facility]    Order Specific Question:   Radiology Contrast Protocol - do NOT remove file path    Answer:   [email or web address]    Order Specific Question:   Reason for Exam additional comments    Answer:   Metastatic breast cancer restaging   The patient has a good understanding of the overall plan. she agrees with it. she will call with any problems that may develop before the next visit here.   Harriette Ohara, MD 08/03/17

## 2017-08-03 NOTE — Telephone Encounter (Signed)
Patient declined AVs and calendar of upcoming January 2020 appointments.

## 2017-10-21 ENCOUNTER — Encounter (HOSPITAL_BASED_OUTPATIENT_CLINIC_OR_DEPARTMENT_OTHER): Payer: Self-pay | Admitting: *Deleted

## 2017-11-09 ENCOUNTER — Ambulatory Visit: Payer: Self-pay | Admitting: Plastic Surgery

## 2017-11-09 NOTE — Anesthesia Preprocedure Evaluation (Addendum)
Anesthesia Evaluation  Patient identified by MRN, date of birth, ID band Patient awake    Reviewed: Allergy & Precautions, NPO status , Patient's Chart, lab work & pertinent test results  Airway Mallampati: I  TM Distance: >3 FB Neck ROM: Full    Dental no notable dental hx. (+) Teeth Intact   Pulmonary neg pulmonary ROS,    Pulmonary exam normal breath sounds clear to auscultation       Cardiovascular Exercise Tolerance: Good negative cardio ROS Normal cardiovascular exam Rhythm:Regular Rate:Normal     Neuro/Psych negative neurological ROS  negative psych ROS   GI/Hepatic negative GI ROS, Neg liver ROS,   Endo/Other  negative endocrine ROS  Renal/GU negative Renal ROS  negative genitourinary   Musculoskeletal   Abdominal Normal abdominal exam  (+)   Peds  Hematology negative hematology ROS (+)   Anesthesia Other Findings   Reproductive/Obstetrics negative OB ROS                            Anesthesia Physical Anesthesia Plan  ASA: II  Anesthesia Plan: General   Post-op Pain Management:    Induction: Intravenous  PONV Risk Score and Plan: Treatment may vary due to age or medical condition, Ondansetron, Dexamethasone and Scopolamine patch - Pre-op  Airway Management Planned: LMA  Additional Equipment:   Intra-op Plan:   Post-operative Plan:   Informed Consent: I have reviewed the patients History and Physical, chart, labs and discussed the procedure including the risks, benefits and alternatives for the proposed anesthesia with the patient or authorized representative who has indicated his/her understanding and acceptance.   Dental advisory given  Plan Discussed with: CRNA  Anesthesia Plan Comments:       Anesthesia Quick Evaluation

## 2017-11-10 ENCOUNTER — Other Ambulatory Visit: Payer: Self-pay

## 2017-11-10 ENCOUNTER — Encounter (HOSPITAL_BASED_OUTPATIENT_CLINIC_OR_DEPARTMENT_OTHER)
Admission: RE | Disposition: A | Discharge status: Home / Self Care | Payer: Self-pay | Source: Ambulatory Visit | Attending: Plastic Surgery

## 2017-11-10 ENCOUNTER — Encounter (HOSPITAL_BASED_OUTPATIENT_CLINIC_OR_DEPARTMENT_OTHER): Payer: Self-pay | Admitting: Plastic Surgery

## 2017-11-10 ENCOUNTER — Ambulatory Visit (HOSPITAL_BASED_OUTPATIENT_CLINIC_OR_DEPARTMENT_OTHER): Payer: Medicaid Other | Admitting: Anesthesiology

## 2017-11-10 ENCOUNTER — Ambulatory Visit (HOSPITAL_BASED_OUTPATIENT_CLINIC_OR_DEPARTMENT_OTHER)
Admission: RE | Admit: 2017-11-10 | Discharge: 2017-11-10 | Disposition: A | Discharge status: Home / Self Care | Payer: Medicaid Other | Source: Ambulatory Visit | Attending: Plastic Surgery | Admitting: Plastic Surgery

## 2017-11-10 DIAGNOSIS — Z853 Personal history of malignant neoplasm of breast: Secondary | ICD-10-CM | POA: Insufficient documentation

## 2017-11-10 DIAGNOSIS — N6489 Other specified disorders of breast: Secondary | ICD-10-CM | POA: Insufficient documentation

## 2017-11-10 HISTORY — PX: LIPOSUCTION WITH LIPOFILLING: SHX6436

## 2017-11-10 HISTORY — PX: PLACEMENT OF BREAST IMPLANTS: SHX6334

## 2017-11-10 SURGERY — INSERTION, IMPLANT, BREAST
Anesthesia: Regional | Site: Chest | Laterality: Right

## 2017-11-10 MED ORDER — SODIUM CHLORIDE 0.9% FLUSH: 3.0000 mL | Freq: Two times a day (BID) | INTRAVENOUS | Status: DC

## 2017-11-10 MED ORDER — LIDOCAINE HCL (CARDIAC) PF 100 MG/5ML IV SOSY
PREFILLED_SYRINGE | INTRAVENOUS | Status: AC
  Filled 2017-11-10: qty 5

## 2017-11-10 MED ORDER — SODIUM CHLORIDE 0.9 % IJ SOLN
INTRAMUSCULAR | Status: AC
Start: 2017-11-10 — End: 2017-11-10
  Filled 2017-11-10: qty 10

## 2017-11-10 MED ORDER — PROPOFOL 500 MG/50ML IV EMUL
INTRAVENOUS | Status: AC
  Filled 2017-11-10: qty 50

## 2017-11-10 MED ORDER — HYDROMORPHONE HCL 1 MG/ML IJ SOLN: 0.2500 mg | INTRAMUSCULAR | Status: DC | PRN

## 2017-11-10 MED ORDER — DEXAMETHASONE SODIUM PHOSPHATE 10 MG/ML IJ SOLN
INTRAMUSCULAR | Status: AC
  Filled 2017-11-10: qty 1

## 2017-11-10 MED ORDER — LACTATED RINGERS IV SOLN
INTRAVENOUS | Status: DC
  Administered 2017-11-10: 10:00:00 via INTRAVENOUS
  Administered 2017-11-10: 10 mL/h via INTRAVENOUS
  Administered 2017-11-10: 08:00:00 via INTRAVENOUS

## 2017-11-10 MED ORDER — LIDOCAINE HCL (CARDIAC) PF 100 MG/5ML IV SOSY
PREFILLED_SYRINGE | INTRAVENOUS | Status: DC | PRN
  Administered 2017-11-10: 75 mg via INTRAVENOUS

## 2017-11-10 MED ORDER — ONDANSETRON HCL 4 MG/2ML IJ SOLN
INTRAMUSCULAR | Status: AC
  Filled 2017-11-10: qty 2

## 2017-11-10 MED ORDER — PROMETHAZINE HCL 25 MG/ML IJ SOLN: 6.2500 mg | INTRAMUSCULAR | Status: DC | PRN

## 2017-11-10 MED ORDER — EPHEDRINE SULFATE 50 MG/ML IJ SOLN
INTRAMUSCULAR | Status: DC | PRN
  Administered 2017-11-10: 10 mg via INTRAVENOUS

## 2017-11-10 MED ORDER — ACETAMINOPHEN 10 MG/ML IV SOLN: 1000.0000 mg | Freq: Once | INTRAVENOUS | Status: DC | PRN

## 2017-11-10 MED ORDER — LIDOCAINE HCL (PF) 1 % IJ SOLN
INTRAMUSCULAR | Status: AC
  Filled 2017-11-10: qty 60

## 2017-11-10 MED ORDER — DEXAMETHASONE SODIUM PHOSPHATE 4 MG/ML IJ SOLN
INTRAMUSCULAR | Status: DC | PRN
  Administered 2017-11-10: 10 mg via INTRAVENOUS

## 2017-11-10 MED ORDER — MIDAZOLAM HCL 5 MG/5ML IJ SOLN
INTRAMUSCULAR | Status: DC | PRN
  Administered 2017-11-10: 2 mg via INTRAVENOUS

## 2017-11-10 MED ORDER — EPHEDRINE 5 MG/ML INJ
INTRAVENOUS | Status: AC
  Filled 2017-11-10: qty 10

## 2017-11-10 MED ORDER — OXYCODONE HCL 5 MG PO TABS: 5.0000 mg | ORAL_TABLET | ORAL | Status: DC | PRN

## 2017-11-10 MED ORDER — SODIUM CHLORIDE 0.9 % IV SOLN
INTRAVENOUS | Status: DC | PRN
  Administered 2017-11-10: 500 mL

## 2017-11-10 MED ORDER — LIDOCAINE HCL 2 % IJ SOLN
INTRAMUSCULAR | Status: AC
Start: 2017-11-10 — End: 2017-11-10
  Filled 2017-11-10: qty 40

## 2017-11-10 MED ORDER — BUPIVACAINE-EPINEPHRINE (PF) 0.25% -1:200000 IJ SOLN
INTRAMUSCULAR | Status: AC
  Filled 2017-11-10: qty 90

## 2017-11-10 MED ORDER — MEPERIDINE HCL 25 MG/ML IJ SOLN: 6.2500 mg | INTRAMUSCULAR | Status: DC | PRN

## 2017-11-10 MED ORDER — CEFAZOLIN SODIUM-DEXTROSE 2-4 GM/100ML-% IV SOLN
INTRAVENOUS | Status: AC
  Filled 2017-11-10: qty 100

## 2017-11-10 MED ORDER — LIDOCAINE-EPINEPHRINE 1 %-1:100000 IJ SOLN
INTRAMUSCULAR | Status: AC
  Filled 2017-11-10: qty 2

## 2017-11-10 MED ORDER — ONDANSETRON HCL 4 MG/2ML IJ SOLN
INTRAMUSCULAR | Status: DC | PRN
  Administered 2017-11-10: 4 mg via INTRAVENOUS

## 2017-11-10 MED ORDER — BUPIVACAINE-EPINEPHRINE 0.25% -1:200000 IJ SOLN
INTRAMUSCULAR | Status: DC | PRN
  Administered 2017-11-10: 10 mL

## 2017-11-10 MED ORDER — SODIUM CHLORIDE 0.9% FLUSH: 3.0000 mL | INTRAVENOUS | Status: DC | PRN

## 2017-11-10 MED ORDER — SUFENTANIL CITRATE 50 MCG/ML IV SOLN
INTRAVENOUS | Status: AC
  Filled 2017-11-10: qty 1

## 2017-11-10 MED ORDER — LIDOCAINE HCL 1 % IJ SOLN
INTRAVENOUS | Status: DC | PRN
  Administered 2017-11-10: 650 mL

## 2017-11-10 MED ORDER — SUFENTANIL CITRATE 50 MCG/ML IV SOLN
INTRAVENOUS | Status: DC | PRN
  Administered 2017-11-10 (×2): 5 ug via INTRAVENOUS
  Administered 2017-11-10: 20 ug via INTRAVENOUS

## 2017-11-10 MED ORDER — HYDROCODONE-ACETAMINOPHEN 7.5-325 MG PO TABS: 1.0000 | ORAL_TABLET | Freq: Once | ORAL | Status: DC | PRN

## 2017-11-10 MED ORDER — SODIUM CHLORIDE 0.9 % IV SOLN: 250.0000 mL | INTRAVENOUS | Status: DC | PRN

## 2017-11-10 MED ORDER — LIDOCAINE-EPINEPHRINE 1 %-1:100000 IJ SOLN: INTRAMUSCULAR | Status: DC | PRN

## 2017-11-10 MED ORDER — PHENYLEPHRINE 40 MCG/ML (10ML) SYRINGE FOR IV PUSH (FOR BLOOD PRESSURE SUPPORT)
PREFILLED_SYRINGE | INTRAVENOUS | Status: AC
Start: 2017-11-10 — End: 2017-11-10
  Filled 2017-11-10: qty 10

## 2017-11-10 MED ORDER — SCOPOLAMINE 1 MG/3DAYS TD PT72
1.0000 | MEDICATED_PATCH | Freq: Once | TRANSDERMAL | Status: DC
  Administered 2017-11-10: 1.5 mg via TRANSDERMAL

## 2017-11-10 MED ORDER — SUCCINYLCHOLINE CHLORIDE 200 MG/10ML IV SOSY
PREFILLED_SYRINGE | INTRAVENOUS | Status: AC
  Filled 2017-11-10: qty 10

## 2017-11-10 MED ORDER — MIDAZOLAM HCL 2 MG/2ML IJ SOLN
INTRAMUSCULAR | Status: AC
  Filled 2017-11-10: qty 2

## 2017-11-10 MED ORDER — PROPOFOL 10 MG/ML IV BOLUS
INTRAVENOUS | Status: DC | PRN
  Administered 2017-11-10: 100 mg via INTRAVENOUS

## 2017-11-10 MED ORDER — EPINEPHRINE 30 MG/30ML IJ SOLN
INTRAMUSCULAR | Status: AC
  Filled 2017-11-10: qty 1

## 2017-11-10 MED ORDER — CEFAZOLIN SODIUM-DEXTROSE 2-4 GM/100ML-% IV SOLN
2.0000 g | INTRAVENOUS | Status: AC
  Administered 2017-11-10: 2 g via INTRAVENOUS

## 2017-11-10 MED ORDER — BACITRACIN-NEOMYCIN-POLYMYXIN 400-5-5000 EX OINT
TOPICAL_OINTMENT | CUTANEOUS | Status: AC
  Filled 2017-11-10: qty 1

## 2017-11-10 MED ORDER — SCOPOLAMINE 1 MG/3DAYS TD PT72
MEDICATED_PATCH | TRANSDERMAL | Status: AC
  Filled 2017-11-10: qty 1

## 2017-11-10 SURGICAL SUPPLY — 85 items
BAG DECANTER FOR FLEXI CONT (MISCELLANEOUS) ×4 IMPLANT
BINDER ABDOMINAL  9 SM 30-45 (SOFTGOODS) ×2
BINDER ABDOMINAL 10 UNV 27-48 (MISCELLANEOUS) IMPLANT
BINDER ABDOMINAL 12 SM 30-45 (SOFTGOODS) IMPLANT
BINDER ABDOMINAL 9 SM 30-45 (SOFTGOODS) ×2 IMPLANT
BINDER BREAST LRG (GAUZE/BANDAGES/DRESSINGS) IMPLANT
BINDER BREAST MEDIUM (GAUZE/BANDAGES/DRESSINGS) ×4 IMPLANT
BINDER BREAST XLRG (GAUZE/BANDAGES/DRESSINGS) IMPLANT
BINDER BREAST XXLRG (GAUZE/BANDAGES/DRESSINGS) IMPLANT
BIOPATCH RED 1 DISK 7.0 (GAUZE/BANDAGES/DRESSINGS) IMPLANT
BIOPATCH RED 1IN DISK 7.0MM (GAUZE/BANDAGES/DRESSINGS)
BLADE HEX COATED 2.75 (ELECTRODE) ×4 IMPLANT
BLADE SURG 15 STRL LF DISP TIS (BLADE) ×2 IMPLANT
BLADE SURG 15 STRL SS (BLADE) ×2
BNDG GAUZE ELAST 4 BULKY (GAUZE/BANDAGES/DRESSINGS) ×8 IMPLANT
CANISTER SUCT 1200ML W/VALVE (MISCELLANEOUS) ×4 IMPLANT
CHLORAPREP W/TINT 26ML (MISCELLANEOUS) ×4 IMPLANT
COVER BACK TABLE 60X90IN (DRAPES) ×4 IMPLANT
COVER MAYO STAND STRL (DRAPES) ×4 IMPLANT
DECANTER SPIKE VIAL GLASS SM (MISCELLANEOUS) IMPLANT
DERMABOND ADVANCED (GAUZE/BANDAGES/DRESSINGS) ×4
DERMABOND ADVANCED .7 DNX12 (GAUZE/BANDAGES/DRESSINGS) ×4 IMPLANT
DRAPE LAPAROSCOPIC ABDOMINAL (DRAPES) ×4 IMPLANT
DRSG PAD ABDOMINAL 8X10 ST (GAUZE/BANDAGES/DRESSINGS) ×16 IMPLANT
ELECT BLADE 4.0 EZ CLEAN MEGAD (MISCELLANEOUS) ×4
ELECT BLADE 6.5 EXT (BLADE) IMPLANT
ELECT REM PT RETURN 9FT ADLT (ELECTROSURGICAL) ×4
ELECTRODE BLDE 4.0 EZ CLN MEGD (MISCELLANEOUS) ×2 IMPLANT
ELECTRODE REM PT RTRN 9FT ADLT (ELECTROSURGICAL) ×2 IMPLANT
EXTRACTOR CANIST REVOLVE STRL (CANNISTER) ×4 IMPLANT
GAUZE SPONGE 4X4 12PLY STRL LF (GAUZE/BANDAGES/DRESSINGS) IMPLANT
GLOVE BIO SURGEON STRL SZ 6.5 (GLOVE) ×12 IMPLANT
GLOVE BIO SURGEONS STRL SZ 6.5 (GLOVE) ×4
GOWN STRL REUS W/ TWL LRG LVL3 (GOWN DISPOSABLE) ×2 IMPLANT
GOWN STRL REUS W/ TWL XL LVL3 (GOWN DISPOSABLE) ×2 IMPLANT
GOWN STRL REUS W/TWL LRG LVL3 (GOWN DISPOSABLE) ×2
GOWN STRL REUS W/TWL XL LVL3 (GOWN DISPOSABLE) ×2
IMPL BREAST GEL MP 100CC (Breast) ×2 IMPLANT
IMPLANT BREAST GEL MP 100CC (Breast) ×4 IMPLANT
IV LACTATED RINGERS 1000ML (IV SOLUTION) ×12 IMPLANT
IV NS 1000ML (IV SOLUTION)
IV NS 1000ML BAXH (IV SOLUTION) IMPLANT
IV NS 500ML (IV SOLUTION) ×2
IV NS 500ML BAXH (IV SOLUTION) ×2 IMPLANT
KIT FILL SYSTEM UNIVERSAL (SET/KITS/TRAYS/PACK) IMPLANT
LINER CANISTER 1000CC FLEX (MISCELLANEOUS) ×16 IMPLANT
NDL SAFETY ECLIPSE 18X1.5 (NEEDLE) ×2 IMPLANT
NEEDLE HYPO 18GX1.5 SHARP (NEEDLE) ×2
NEEDLE HYPO 25X1 1.5 SAFETY (NEEDLE) ×4 IMPLANT
NEEDLE SPNL 18GX3.5 QUINCKE PK (NEEDLE) IMPLANT
NS IRRIG 1000ML POUR BTL (IV SOLUTION) IMPLANT
PACK BASIN DAY SURGERY FS (CUSTOM PROCEDURE TRAY) ×4 IMPLANT
PAD ALCOHOL SWAB (MISCELLANEOUS) ×4 IMPLANT
PENCIL BUTTON HOLSTER BLD 10FT (ELECTRODE) ×4 IMPLANT
SIZER BREAST REUSE 100CC (SIZER) ×4
SIZER BREAST REUSE 125CC (SIZER) ×2
SIZER BRST REUSE 100CC (SIZER) ×2 IMPLANT
SIZER BRST REUSE P3.5XHI 125CC (SIZER) ×2 IMPLANT
SLEEVE SCD COMPRESS KNEE MED (MISCELLANEOUS) ×4 IMPLANT
SPONGE LAP 18X18 RF (DISPOSABLE) ×8 IMPLANT
SUT MNCRL AB 4-0 PS2 18 (SUTURE) ×4 IMPLANT
SUT MON AB 3-0 SH 27 (SUTURE) ×2
SUT MON AB 3-0 SH27 (SUTURE) ×2 IMPLANT
SUT MON AB 5-0 PS2 18 (SUTURE) ×8 IMPLANT
SUT PDS 3-0 CT2 (SUTURE)
SUT PDS AB 2-0 CT2 27 (SUTURE) IMPLANT
SUT PDS II 3-0 CT2 27 ABS (SUTURE) IMPLANT
SUT SILK 3 0 PS 1 (SUTURE) IMPLANT
SUT VIC AB 3-0 SH 27 (SUTURE)
SUT VIC AB 3-0 SH 27X BRD (SUTURE) IMPLANT
SUT VICRYL 4-0 PS2 18IN ABS (SUTURE) IMPLANT
SYR 10ML LL (SYRINGE) ×16 IMPLANT
SYR 3ML 18GX1 1/2 (SYRINGE) IMPLANT
SYR 50ML LL SCALE MARK (SYRINGE) ×8 IMPLANT
SYR BULB IRRIGATION 50ML (SYRINGE) ×4 IMPLANT
SYR CONTROL 10ML LL (SYRINGE) ×4 IMPLANT
SYR TOOMEY 50ML (SYRINGE) ×8 IMPLANT
TOWEL GREEN STERILE FF (TOWEL DISPOSABLE) ×4 IMPLANT
TOWEL OR 17X24 6PK STRL BLUE (TOWEL DISPOSABLE) ×12 IMPLANT
TUBE CONNECTING 20'X1/4 (TUBING) ×1
TUBE CONNECTING 20X1/4 (TUBING) ×3 IMPLANT
TUBING INFILTRATION IT-10001 (TUBING) ×4 IMPLANT
TUBING SET GRADUATE ASPIR 12FT (MISCELLANEOUS) ×4 IMPLANT
UNDERPAD 30X30 (UNDERPADS AND DIAPERS) ×8 IMPLANT
YANKAUER SUCT BULB TIP NO VENT (SUCTIONS) ×4 IMPLANT

## 2017-11-10 NOTE — Anesthesia Postprocedure Evaluation (Signed)
Anesthesia Post Note  Patient: Dawn Haynes  Procedure(s) Performed: PLACEMENT OF BREAST IMPLANT RIGHT (Right Chest) FAT GRAFTING TO LEFT UPPER POLL CHEST (Left Chest)     Anesthesia Type: Regional    Last Vitals:  Vitals:   11/10/17 1045 11/10/17 1101  BP: 114/86 115/79  Pulse: (!) 58 (!) 56  Resp: 18 18  Temp:    SpO2: 100% 100%    Last Pain:  Vitals:   11/10/17 1101  TempSrc:   PainSc: 0-No pain                 Barnet Glasgow

## 2017-11-10 NOTE — Op Note (Signed)
Op report Unilateral Breast Exchange   DATE OF OPERATION:  11/10/2017  LOCATION: [place]  SURGICAL DIVISION: Plastic Surgery  PREOPERATIVE DIAGNOSES:  1. History of left breast cancer.  2. Acquired absence of left breast.  3. Right Breast asymmetry after reconstruction.  POSTOPERATIVE DIAGNOSES:  1. History of left breast cancer.  2. Acquired absence of left breast.  3. Right Breast asymmetry after reconstruction  PROCEDURE:  1. Placement of right breast implant for symmetry. 2. Fat grafting of right port-a-cath site. 3. Fat grafting of left superior and lateral breast for better symmetry.  SURGEON: Claire Sanger Dillingham, DO  ANESTHESIA:  General.   COMPLICATIONS: None.   IMPLANTS: [employer]or Smooth Round Moderate plus Gel 100cc. Ref #[id number].  Serial Number [id number]  INDICATIONS FOR PROCEDURE:  The patient, Dawn Haynes, is a 37 y.o. female born on August 19, 1980, is here for treatment for further treatment after a mastectomy and placement of a tissue expander. She now presents for exchange of her expander for an implant.  She requires capsulotomies to better position the implant. MRN: [id number]  CONSENT:  Informed consent was obtained directly from the patient. Risks, benefits and alternatives were fully discussed. Specific risks including but not limited to bleeding, infection, hematoma, seroma, scarring, pain, implant infection, implant extrusion, capsular contracture, asymmetry, wound healing problems, and need for further surgery were all discussed. The patient did have an ample opportunity to have her questions answered to her satisfaction.   DESCRIPTION OF PROCEDURE:  The patient was taken to the operating room. SCDs were placed and IV antibiotics were given. The patient's chest was prepped and draped in a sterile fashion. A time out was performed and the implants to be used were identified.  Local with epinephrine was used to infiltrate the  area for the small incisions of the abdomen, left lateral breast incision, right port site and right inframammary fold.   A small incision was made in the previous abdominal umbilical scar. Tumescent was infused for 200 cc.  After waiting for the local to take effect the liposuction was performed of the abdominal fat and collected in the [employer] system.  The fat was prepared and placed in 10 cc syringes.  An incision was made 3 cm in size at the right lower inframammary fold area.  The bovie was used to dissect to the pectoralis muscle.  The pectoralis was then lifted and a pocket created for the implant.  Hemostasis was ensured with electrocautery.  The pocket was irrigated with antibiotic solution.  New gloves were placed.  The implant was placed in the pocket and oriented appropriately. The deep layer was closed with the 3-0 Monocryl.  The remaining skin was closed with 4-0 Monocryl deep dermal and 5-0 Monocryl subcuticular stitches.    The fat was then injected into the area of the previous port-a-cath for 15 cc and the remaining 40 cc in the left upper and lateral poll.  The incisions were closed with the 5-0 Monocryl.  Dermabond and steri strips were applied.  A breast binder and ABD was applied.  The patient was allowed to wake from anesthesia and taken to the recovery room in satisfactory condition.

## 2017-11-10 NOTE — Discharge Instructions (Signed)
No heavy lifting. May shower tomorrow. Continue binder or sports bra. Follow up in one week.   Post Anesthesia Home Care Instructions  Activity: Get plenty of rest for the remainder of the day. A responsible individual must stay with you for 24 hours following the procedure.  For the next 24 hours, DO NOT: -Drive a car -Paediatric nurse -Drink alcoholic beverages -Take any medication unless instructed by your physician -Make any legal decisions or sign important papers.  Meals: Start with liquid foods such as gelatin or soup. Progress to regular foods as tolerated. Avoid greasy, spicy, heavy foods. If nausea and/or vomiting occur, drink only clear liquids until the nausea and/or vomiting subsides. Call your physician if vomiting continues.  Special Instructions/Symptoms: Your throat may feel dry or sore from the anesthesia or the breathing tube placed in your throat during surgery. If this causes discomfort, gargle with warm salt water. The discomfort should disappear within 24 hours.  If you had a scopolamine patch placed behind your ear for the management of post- operative nausea and/or vomiting:  1. The medication in the patch is effective for 72 hours, after which it should be removed.  Wrap patch in a tissue and discard in the trash. Wash hands thoroughly with soap and water. 2. You may remove the patch earlier than 72 hours if you experience unpleasant side effects which may include dry mouth, dizziness or visual disturbances. 3. Avoid touching the patch. Wash your hands with soap and water after contact with the patch.

## 2017-11-10 NOTE — Anesthesia Postprocedure Evaluation (Signed)
Anesthesia Post Note  Patient: Dawn Haynes  Procedure(s) Performed: PLACEMENT OF BREAST IMPLANT RIGHT (Right Chest) FAT GRAFTING TO LEFT UPPER POLL CHEST (Left Chest)     Patient location during evaluation: PACU Anesthesia Type: General Level of consciousness: awake and alert Pain management: pain level controlled Vital Signs Assessment: post-procedure vital signs reviewed and stable Respiratory status: spontaneous breathing, nonlabored ventilation, respiratory function stable and patient connected to nasal cannula oxygen Cardiovascular status: blood pressure returned to baseline and stable Postop Assessment: no apparent nausea or vomiting Anesthetic complications: no    Last Vitals:  Vitals:   11/10/17 1045 11/10/17 1101  BP: 114/86 115/79  Pulse: (!) 58 (!) 56  Resp: 18 18  Temp:    SpO2: 100% 100%    Last Pain:  Vitals:   11/10/17 1101  TempSrc:   PainSc: 0-No pain                 Barnet Glasgow

## 2017-11-10 NOTE — Addendum Note (Signed)
Addendum  created 11/10/17 1209 by Barnet Glasgow, MD   Sign clinical note

## 2017-11-10 NOTE — Transfer of Care (Signed)
Immediate Anesthesia Transfer of Care Note  Patient: Dawn Haynes  Procedure(s) Performed: PLACEMENT OF BREAST IMPLANT RIGHT (Right Chest) FAT GRAFTING TO LEFT UPPER POLL CHEST (Left Chest)  Patient Location: PACU  Anesthesia Type:General  Level of Consciousness: awake, alert , oriented and drowsy  Airway & Oxygen Therapy: Patient Spontanous Breathing and Patient connected to face mask oxygen  Post-op Assessment: Report given to RN and Post -op Vital signs reviewed and stable  Post vital signs: Reviewed and stable  Last Vitals:  Vitals Value Taken Time  BP 112/79 11/10/2017 10:16 AM  Temp    Pulse 81 11/10/2017 10:19 AM  Resp 14 11/10/2017 10:19 AM  SpO2 100 % 11/10/2017 10:19 AM  Vitals shown include unvalidated device data.  Last Pain:  Vitals:   11/10/17 0750  TempSrc: Oral  PainSc: 0-No pain      Patients Stated Pain Goal: 0 (66/06/30 1601)  Complications: No apparent anesthesia complications

## 2017-11-10 NOTE — Anesthesia Procedure Notes (Signed)
Procedure Name: LMA Insertion Date/Time: 11/10/2017 8:35 AM Performed by: Willa Frater, CRNA Pre-anesthesia Checklist: Patient identified, Emergency Drugs available, Suction available and Patient being monitored Patient Re-evaluated:Patient Re-evaluated prior to induction Oxygen Delivery Method: Circle system utilized Preoxygenation: Pre-oxygenation with 100% oxygen Induction Type: IV induction Ventilation: Mask ventilation without difficulty LMA: LMA inserted LMA Size: 4.0 Number of attempts: 1 Airway Equipment and Method: Bite block Placement Confirmation: positive ETCO2 Tube secured with: Tape Dental Injury: Teeth and Oropharynx as per pre-operative assessment

## 2017-11-10 NOTE — H&P (Signed)
Dawn Haynes is an 37 y.o. female.   Chief Complaint: breast cancer HPI: The patient is a 37 y.o. yrs old bf here for pre operative history and physical prior to right breast silicone implant placement and lipo-filling to the left reconstructed breast to improve breast symmetry following left breast reconstruction. She underwent a left breast reconstruction with mastectomy and eventual placement of a mentor smooth round high profile 250 cc gel implant.  She finished radiation in November 2018.  She is doing very well and the implant has great position.  The skin incision is well healed.  The implant is soft.  She would like better symmetry with an implant placed on the right side.  She has very tight paraspinal muscles on each side and complains of tightness and pain in the area but this has improved with heat therapy.   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  She underwent chemotherapy (carboplatin, gemcitabine).  She had a positive response with no palpable breast mass and resolution of the liver by PET scan.  The ascites improved and the peritoneal catheter was removed.  CT showed posterior right lobe of liver with lesion.  She has 6 children (3, 6, 11, 35 and 46 yrs old).  She underwent a lumpectomy but has positive margins.  She has decided on a LEFT mastectomy.  She is 5 feet 3 inches tall, weight is 137 pounds.  Preop bra = 36 B.   Past Medical History:  Diagnosis Date  . Back ache   . Breast cancer ([place]) 07/09/2015   left breast  . Breast cancer of upper-outer quadrant of left female breast ([place]) 07/11/2015  . DDD (degenerative disc disease), lumbar    L1-2  . Genetic testing 01/12/2017   Ms. Dawn Haynes underwent genetic counseling and testing for hereditary cancer syndromes on 01/05/2017. Her results were negative for mutations in all 46 genes analyzed by [employer]'s 46-gene Common Hereditary Cancers Panel. Genes  analyzed include: APC, ATM, AXIN2, BARD1, BMPR1A, BRCA1, BRCA2, BRIP1, CDH1, CDKN2A, CHEK2, CTNNA1, DICER1, EPCAM, GREM1, HOXB13, KIT, MEN1, MLH1, MSH2, MSH3, MSH6, MUTYH, NBN,   . Genetic testing of female    [facility] panel negative 01/2017  . History of blood transfusion    "when I lost alot of blood when I went into premature labor"; no abnormal reaction noted  . Weakness    numbness and tingling in both hands    Past Surgical History:  Procedure Laterality Date  . BREAST BIOPSY Left 2018  . BREAST LUMPECTOMY WITH RADIOACTIVE SEED AND SENTINEL LYMPH NODE BIOPSY Left 10/28/2016   Procedure: LEFT BREAST LUMPECTOMY WITH BRACKETED RADIOACTIVE SEED AND SENTINEL LYMPH NODE BIOPSY;  Surgeon: [name], MD;  Location: [facility];  Service: General;  Laterality: Left;  . BREAST RECONSTRUCTION WITH PLACEMENT OF TISSUE EXPANDER AND FLEX HD (ACELLULAR HYDRATED DERMIS) Left 12/23/2016  . BREAST RECONSTRUCTION WITH PLACEMENT OF TISSUE EXPANDER AND FLEX HD (ACELLULAR HYDRATED DERMIS) Left 12/23/2016   Procedure: IMMEDIATE LEFT BREAST RECONSTRUCTION WITH PLACEMENT OF TISSUE EXPANDER AND FLEX HD (ACELLULAR HYDRATED DERMIS);  Surgeon: Wallace Going, DO;  Location: [facility];  Service: Plastics;  Laterality: Left;  . CESAREAN SECTION N/A 01/22/2013   Procedure: primary CESAREAN SECTION of baby boy at 2001;  Surgeon: Cheri Fowler, MD;  Location: [facility];  Service: Obstetrics;  Laterality: N/A;  . DILATION AND CURETTAGE OF UTERUS    . HERNIA REPAIR     UHR  .  INDUCED ABORTION    . LAPAROSCOPIC CHOLECYSTECTOMY  Aug 2000  . MASTECTOMY Left 12/23/2016  . PARACENTESIS  07/17/2015   tube put in to drain fluid from near liver; US guided/notes 07/17/2015  . PORT-A-CATH REMOVAL Right 02/25/2017   Procedure: REMOVAL PORT-A-CATH;  Surgeon: Wallace Going, DO;  Location: [facility];  Service: Plastics;  Laterality: Right;  . PORTACATH PLACEMENT Right 07/24/2015   Procedure: INSERTION PORT-A-CATH RIGHT  SUBCLAVIAN;  Surgeon: [name], MD;  Location: [facility];  Service: General;  Laterality: Right;  . RE-EXCISION OF BREAST LUMPECTOMY Left 11/09/2016   Procedure: RE-EXCISION OF BREAST LUMPECTOMY;  Surgeon: [name], MD;  Location: [facility];  Service: General;  Laterality: Left;  . REMOVAL OF TISSUE EXPANDER AND PLACEMENT OF IMPLANT Left 02/25/2017   Procedure: REMOVAL OF LEFT BREAST  TISSUE EXPANDER AND PLACEMENT OF LEFT SILICONE IMPLANT;  Surgeon: Wallace Going, DO;  Location: [facility];  Service: Plastics;  Laterality: Left;  . TOTAL MASTECTOMY Left 12/23/2016   Procedure: LEFT MASTECTOMY;  Surgeon: [name], MD;  Location: [facility];  Service: General;  Laterality: Left;  . UMBILICAL HERNIA REPAIR  ~ 1983  . WISDOM TOOTH EXTRACTION  2002    Family History  Problem Relation Age of Onset  . Diabetes Maternal Grandmother    Social History:  reports that she has never smoked. She has never used smokeless tobacco. She reports that she drinks alcohol. She reports that she does not use drugs.  Allergies:  Allergies  Allergen Reactions  . No Known Allergies     Medications Prior to Admission  Medication Sig Dispense Refill  . Ascorbic Acid (VITAMIN C) 1000 MG tablet Take 1,000 mg by mouth 2 (two) times daily. IN THE MORNING & IN THE AFTERNOON    . Multiple Vitamin (MULTIVITAMIN) tablet Take 1 tablet by mouth daily.      No results found for this or any previous visit (from the past 48 hour(s)). No results found.  Review of Systems  Constitutional: Negative.   HENT: Negative.   Eyes: Negative.   Respiratory: Negative.   Cardiovascular: Negative.   Gastrointestinal: Negative.   Genitourinary: Negative.   Musculoskeletal: Negative.   Skin: Negative.   Neurological: Negative.   Psychiatric/Behavioral: Negative.     Height '5\' 3"'$  (1.6 m), weight 62.6 kg (138 lb), last menstrual period 10/18/2017. Physical Exam  Constitutional: She is  oriented to person, place, and time. She appears well-developed and well-nourished.  HENT:  Head: Normocephalic and atraumatic.  Eyes: Pupils are equal, round, and reactive to light. EOM are normal.  Cardiovascular: Normal rate.  Respiratory: Effort normal.  GI: Soft.  Neurological: She is alert and oriented to person, place, and time.  Skin: Skin is warm.  Psychiatric: She has a normal mood and affect. Her behavior is normal. Judgment and thought content normal.     Assessment/Plan Plan right breast silicone implant placement and lipo-filling of left upper breast and right chest port site to improve symmetry following left breast reconstruction  [place], DO 11/10/2017, 7:39 AM

## 2017-11-11 ENCOUNTER — Encounter (HOSPITAL_BASED_OUTPATIENT_CLINIC_OR_DEPARTMENT_OTHER): Payer: Self-pay | Admitting: Plastic Surgery

## 2017-12-13 ENCOUNTER — Other Ambulatory Visit: Payer: Self-pay | Admitting: Plastic Surgery

## 2017-12-13 DIAGNOSIS — Z853 Personal history of malignant neoplasm of breast: Secondary | ICD-10-CM

## 2017-12-13 DIAGNOSIS — N63 Unspecified lump in unspecified breast: Secondary | ICD-10-CM

## 2017-12-16 ENCOUNTER — Ambulatory Visit
Admission: RE | Admit: 2017-12-16 | Discharge: 2017-12-16 | Disposition: A | Discharge status: Home / Self Care | Payer: Medicaid Other | Source: Ambulatory Visit | Attending: Plastic Surgery | Admitting: Plastic Surgery

## 2017-12-16 ENCOUNTER — Other Ambulatory Visit: Payer: Self-pay | Admitting: Plastic Surgery

## 2017-12-16 DIAGNOSIS — Z853 Personal history of malignant neoplasm of breast: Secondary | ICD-10-CM

## 2017-12-16 DIAGNOSIS — N63 Unspecified lump in unspecified breast: Secondary | ICD-10-CM

## 2017-12-16 DIAGNOSIS — N641 Fat necrosis of breast: Secondary | ICD-10-CM

## 2018-01-14 ENCOUNTER — Encounter (HOSPITAL_BASED_OUTPATIENT_CLINIC_OR_DEPARTMENT_OTHER): Payer: Self-pay

## 2018-01-14 ENCOUNTER — Ambulatory Visit (HOSPITAL_BASED_OUTPATIENT_CLINIC_OR_DEPARTMENT_OTHER): Admit: 2018-01-14 | Payer: Medicaid Other | Admitting: Plastic Surgery

## 2018-01-14 SURGERY — MASTOPEXY
Anesthesia: General | Site: Breast | Laterality: Right

## 2018-02-21 ENCOUNTER — Telehealth: Payer: Self-pay

## 2018-02-21 DIAGNOSIS — Z171 Estrogen receptor negative status [ER-]: Principal | ICD-10-CM

## 2018-02-21 DIAGNOSIS — C50412 Malignant neoplasm of upper-outer quadrant of left female breast: Secondary | ICD-10-CM

## 2018-02-21 NOTE — Telephone Encounter (Signed)
Returned patient's call regarding problems with lymphedema.  Patient stated since beginning of year she has been having increased swelling to left side, worsening to left chest and back area.   Patient is a triple negative.  Patient denies any redness or warmth to the area that is swelling.  Patient has not been able to wear sleeve.  Will place referral to lymphedema clinic for evaluation per recommendations.  Patient voiced agreement and understanding.

## 2018-02-25 ENCOUNTER — Ambulatory Visit: Payer: Medicaid Other | Admitting: Physical Therapy

## 2018-03-03 ENCOUNTER — Ambulatory Visit: Payer: Medicaid Other | Admitting: Physical Therapy

## 2018-03-16 ENCOUNTER — Ambulatory Visit: Payer: Medicaid Other | Admitting: Rehabilitation

## 2018-03-22 ENCOUNTER — Other Ambulatory Visit: Payer: Medicaid Other

## 2018-03-23 ENCOUNTER — Other Ambulatory Visit: Payer: Self-pay | Admitting: Plastic Surgery

## 2018-03-23 DIAGNOSIS — N641 Fat necrosis of breast: Secondary | ICD-10-CM

## 2018-03-23 DIAGNOSIS — Z853 Personal history of malignant neoplasm of breast: Secondary | ICD-10-CM

## 2018-04-13 ENCOUNTER — Other Ambulatory Visit: Payer: Medicaid Other

## 2018-04-26 ENCOUNTER — Ambulatory Visit: Payer: Medicaid Other | Attending: Hematology and Oncology | Admitting: Physical Therapy

## 2018-04-26 ENCOUNTER — Other Ambulatory Visit: Payer: Self-pay

## 2018-04-26 DIAGNOSIS — I890 Lymphedema, not elsewhere classified: Secondary | ICD-10-CM | POA: Insufficient documentation

## 2018-04-26 NOTE — Therapy (Signed)
[place], [place], [address] Phone: [phone]   Fax:  [phone]  Physical Therapy Evaluation  Patient Details  Name: Dawn Haynes MRN: [id number] Date of Birth: 01/14/1981 Referring Provider (PT): Dr. [name]    Encounter Date: 04/26/2018  PT End of Session - 04/26/18 1811    Visit Number  1    Number of Visits  4    Date for PT Re-Evaluation  05/27/18    PT Start Time  3903    PT Stop Time  1430    PT Time Calculation (min)  45 min       Past Medical History:  Diagnosis Date  . Back ache   . Breast cancer ([place]) 07/09/2015   left breast  . Breast cancer of upper-outer quadrant of left female breast ([place]) 07/11/2015  . DDD (degenerative disc disease), lumbar    L1-2  . Genetic testing 01/12/2017   Ms. Offield underwent genetic counseling and testing for hereditary cancer syndromes on 01/05/2017. Her results were negative for mutations in all 46 genes analyzed by [employer]'s 46-gene Common Hereditary Cancers Panel. Genes analyzed include: APC, ATM, AXIN2, BARD1, BMPR1A, BRCA1, BRCA2, BRIP1, CDH1, CDKN2A, CHEK2, CTNNA1, DICER1, EPCAM, GREM1, HOXB13, KIT, MEN1, MLH1, MSH2, MSH3, MSH6, MUTYH, NBN,   . Genetic testing of female    [facility] panel negative 01/2017  . History of blood transfusion    "when I lost alot of blood when I went into premature labor"; no abnormal reaction noted  . Weakness    numbness and tingling in both hands    Past Surgical History:  Procedure Laterality Date  . BREAST BIOPSY Left 2018  . BREAST LUMPECTOMY WITH RADIOACTIVE SEED AND SENTINEL LYMPH NODE BIOPSY Left 10/28/2016   Procedure: LEFT BREAST LUMPECTOMY WITH BRACKETED RADIOACTIVE SEED AND SENTINEL LYMPH NODE BIOPSY;  Surgeon: [name], MD;  Location: [facility];  Service: General;  Laterality: Left;  . BREAST RECONSTRUCTION WITH PLACEMENT OF TISSUE EXPANDER AND FLEX HD (ACELLULAR HYDRATED DERMIS) Left 12/23/2016  . BREAST  RECONSTRUCTION WITH PLACEMENT OF TISSUE EXPANDER AND FLEX HD (ACELLULAR HYDRATED DERMIS) Left 12/23/2016   Procedure: IMMEDIATE LEFT BREAST RECONSTRUCTION WITH PLACEMENT OF TISSUE EXPANDER AND FLEX HD (ACELLULAR HYDRATED DERMIS);  Surgeon: Wallace Going, DO;  Location: [facility];  Service: Plastics;  Laterality: Left;  . CESAREAN SECTION N/A 01/22/2013   Procedure: primary CESAREAN SECTION of baby boy at 2001;  Surgeon: Cheri Fowler, MD;  Location: [facility];  Service: Obstetrics;  Laterality: N/A;  . DILATION AND CURETTAGE OF UTERUS    . HERNIA REPAIR     UHR  . INDUCED ABORTION    . LAPAROSCOPIC CHOLECYSTECTOMY  Aug 2000  . LIPOSUCTION WITH LIPOFILLING Left 11/10/2017   Procedure: FAT GRAFTING TO LEFT UPPER POLL CHEST;  Surgeon: Wallace Going, DO;  Location: [facility];  Service: Plastics;  Laterality: Left;  Marland Kitchen MASTECTOMY Left 12/23/2016  . PARACENTESIS  07/17/2015   tube put in to drain fluid from near liver; US guided/notes 07/17/2015  . PLACEMENT OF BREAST IMPLANTS Right 11/10/2017   Procedure: PLACEMENT OF BREAST IMPLANT RIGHT;  Surgeon: Wallace Going, DO;  Location: [facility];  Service: Plastics;  Laterality: Right;  . PORT-A-CATH REMOVAL Right 02/25/2017   Procedure: REMOVAL PORT-A-CATH;  Surgeon: Wallace Going, DO;  Location: [facility];  Service: Plastics;  Laterality: Right;  . PORTACATH PLACEMENT Right 07/24/2015   Procedure: INSERTION PORT-A-CATH RIGHT SUBCLAVIAN;  Surgeon: [name], MD;  [facility];  Service: General;  Laterality: Right;  . RE-EXCISION OF BREAST LUMPECTOMY Left 11/09/2016   Procedure: RE-EXCISION OF BREAST LUMPECTOMY;  Surgeon: [name], MD;  Location: [facility];  Service: General;  Laterality: Left;  . REMOVAL OF TISSUE EXPANDER AND PLACEMENT OF IMPLANT Left 02/25/2017   Procedure: REMOVAL OF LEFT BREAST  TISSUE EXPANDER AND PLACEMENT OF LEFT SILICONE IMPLANT;  Surgeon:  Wallace Going, DO;  Location: [facility];  Service: Plastics;  Laterality: Left;  . TOTAL MASTECTOMY Left 12/23/2016   Procedure: LEFT MASTECTOMY;  Surgeon: [name], MD;  Location: [facility];  Service: General;  Laterality: Left;  . UMBILICAL HERNIA REPAIR  ~ 1983  . [place] EXTRACTION  2002    There were no vitals filed for this visit.   Subjective Assessment - 04/26/18 1358    Subjective  Pt says she got a compression bra last Monday ( from [place]) and while wearing it her symptoms of pain and swelling in her upper body were relieved but she developed swelling and aches in her trunk below the bra and her whole lower body and legs     Pertinent History  left breast cancer in 2016 with mastectomy and reconstruction and had treatment for left UE lymphedema after that.  On 11/10/2017 she has right implant for symmetry and fat grafting ( that did not stay) . She has a sleeve and glove on her left arm that she wears sometimes          [facility] PT Assessment - 04/26/18 0001      Assessment   Medical Diagnosis  left breast cancer     Referring Provider (PT)  Dr. Molly Maduro Dominance  Right      Home Environment   Living Environment  Private residence    Living Arrangements  Spouse/significant other    Type of [place]  Two level      Prior Function   Level of Independence  Independent      Cognition   Overall Cognitive Status  Within Functional Limits for tasks assessed      Observation/Other Assessments   Observations  pt has difficulty walking into treatment room.  She has on a thin sports bra and and abddminal binder ( pt states her abdomen feels very swollen.) .  Her legs below the knees have no signs of swelling but she states her thighs both appear to be swollen. she does not appear to have swelling at her back or axilla,but she says it feels much better since she has been wearing the compression bra.     Other Surveys   --    lymphedema life impact scale 35.29     Posture/Postural Control   Posture/Postural Control  Postural limitations    Postural Limitations  Rounded Shoulders;Forward head      ROM / Strength   AROM / PROM / Strength  AROM      AROM   Overall AROM   Within functional limits for tasks performed    Overall AROM Comments  pt appears to have diffiuclty lifing her legs and states they are very heavy         LYMPHEDEMA/ONCOLOGY QUESTIONNAIRE - 04/26/18 1413      Type   Cancer Type  left breast      Surgeries   Mastectomy Date  12/25/16   approx.  Lumpectomy Date  10/25/16   approx., and 11/09/16   Saline Implant Reconstruction Date  02/24/17   approx.   Number Lymph Nodes Removed  1   not cancerous, but had liver mets     Treatment   Past Chemotherapy Treatment  Yes    Active Radiation Treatment  Yes      What other symptoms do you have   Are you Having Heaviness or Tightness  Yes    Are you having Pain  Yes      Lymphedema Assessments   Lymphedema Assessments  Upper extremities      Right Upper Extremity Lymphedema   10 cm Proximal to Olecranon Process  25.5 cm    Olecranon Process  23 cm    15 cm Proximal to Ulnar Styloid Process  23 cm    Just Proximal to Ulnar Styloid Process  14.5 cm    Across Hand at [employer]  18.5 cm    At [place] of 2nd Digit  5.8 cm      Left Upper Extremity Lymphedema   10 cm Proximal to Olecranon Process  26 cm    Olecranon Process  24 cm    15 cm Proximal to Ulnar Styloid Process  24 cm    Just Proximal to Ulnar Styloid Process  16 cm    Across Hand at [employer]  18.4 cm    At [place] of 2nd Digit  5.8 cm             Outpatient Re[facility] from [facility]26/2018 [facility]  Lymphedema Life Impact Scale Total Score  35.29 %      Objective measurements completed on examination: See above findings.                   PT Long Term Goals - 04/26/18 1817      PT LONG TERM GOAL #1    Title  Pt will be indpendent in performing self manual lymph drainage     Baseline  needs review     Time  4    Period  Weeks    Status  New      PT LONG TERM GOAL #2   Title  Pt will have compression garments ordered to manage lymphedema     Baseline  no garments ordered     Time  4    Period  Weeks    Status  New      PT LONG TERM GOAL #3   Title  Pt will report symptoms of lymphedema aching are decreased by 50%     Baseline  100%     Time  4    Period  Weeks    Status  New             Plan - 04/26/18 1812    Clinical Impression Statement  Pt presents with c/o swelling in her torso and lower body that are atypical of postmastectomy lymphedema presentation.  Send an inbasket message to Dr. [name] notifying him of the situation and asked pt to call and make an appointment with him.  Pt does have lymphedema ands needs a comprssion sleeve and gauntlet and wants a compression bra to manage the symptoms she was having in her upper body.  Will wait for clearance from MD prior to starting MLD . will send information to Medicaid fitter     History and Personal Factors relevant to plan of care:  previous mastectomy with reconstruction and radiation to left chest     Clinical Presentation  Evolving    Clinical Presentation due to:  increasing atypical swelling     Clinical Decision Making  Moderate    PT Frequency  1x / week    PT Duration  4 weeks    PT Next Visit Plan  if cleared by MD, start MLD.  Make sure garments get measured and ordered     Consulted and Agree with Plan of Care  Patient       Patient will benefit from skilled therapeutic intervention in order to improve the following deficits and impairments:  Increased edema, Decreased knowledge of precautions, Decreased knowledge of use of DME  Visit Diagnosis: Lymphedema, not elsewhere classified - Plan: PT plan of care cert/re-cert     Problem List Patient Active Problem List   Diagnosis Date Noted  . Genetic  testing 01/12/2017  . Breast cancer ([place]) 12/24/2016  . Acquired absence of breast and absent nipple, left 12/23/2016  . Hyponatremia 07/29/2015  . Hyperkalemia 07/29/2015  . Transaminitis 07/29/2015  . Ascites, malignant   . Malignant ascites   . Port catheter in place   . Metastasis to liver ([place]) 07/24/2015  . Ascites 07/17/2015  . SBP (spontaneous bacterial peritonitis) ([place]) 07/17/2015  . Abdominal pain 07/17/2015  . SOB (shortness of breath) 07/17/2015  . DDD (degenerative disc disease), lumbar 07/17/2015  . Severe sepsis ([place])   . Breast cancer of upper-outer quadrant of left female breast ([employer]) 07/11/2015   Donato Heinz. Owens Shark PT  Norwood Levo 04/26/2018, 6:21 PM  [place], [place], [address] Phone: [phone]   Fax:  [phone]  Name: Dawn [place]nes MRN: [id number] Date of Birth: [birth year]

## 2018-04-27 ENCOUNTER — Other Ambulatory Visit: Payer: Self-pay | Admitting: *Deleted

## 2018-04-27 ENCOUNTER — Ambulatory Visit (HOSPITAL_COMMUNITY)
Admission: RE | Admit: 2018-04-27 | Discharge: 2018-04-27 | Disposition: A | Discharge status: Home / Self Care | Payer: Medicaid Other | Source: Ambulatory Visit | Attending: Hematology and Oncology | Admitting: Hematology and Oncology

## 2018-04-27 ENCOUNTER — Telehealth: Payer: Self-pay

## 2018-04-27 ENCOUNTER — Other Ambulatory Visit: Payer: Self-pay

## 2018-04-27 ENCOUNTER — Inpatient Hospital Stay: Payer: Medicaid Other | Attending: Hematology and Oncology | Admitting: Hematology and Oncology

## 2018-04-27 ENCOUNTER — Encounter (HOSPITAL_COMMUNITY): Payer: Self-pay | Admitting: Emergency Medicine

## 2018-04-27 ENCOUNTER — Telehealth: Payer: Self-pay | Admitting: *Deleted

## 2018-04-27 ENCOUNTER — Inpatient Hospital Stay (HOSPITAL_COMMUNITY)
Admission: EM | Admit: 2018-04-27 | Discharge: 2018-04-30 | DRG: 457 | Disposition: A | Discharge status: Home / Self Care | Payer: Medicaid Other | Attending: Neurosurgery | Admitting: Neurosurgery

## 2018-04-27 VITALS — BP 124/79 | HR 103 | Temp 98.7°F | Resp 18 | Ht 63.0 in | Wt 120.3 lb

## 2018-04-27 DIAGNOSIS — M8458XA Pathological fracture in neoplastic disease, other specified site, initial encounter for fracture: Secondary | ICD-10-CM | POA: Diagnosis present

## 2018-04-27 DIAGNOSIS — Z9221 Personal history of antineoplastic chemotherapy: Secondary | ICD-10-CM | POA: Diagnosis not present

## 2018-04-27 DIAGNOSIS — M8448XA Pathological fracture, other site, initial encounter for fracture: Secondary | ICD-10-CM | POA: Diagnosis present

## 2018-04-27 DIAGNOSIS — G9520 Unspecified cord compression: Secondary | ICD-10-CM | POA: Insufficient documentation

## 2018-04-27 DIAGNOSIS — Q059 Spina bifida, unspecified: Secondary | ICD-10-CM

## 2018-04-27 DIAGNOSIS — G992 Myelopathy in diseases classified elsewhere: Secondary | ICD-10-CM | POA: Diagnosis not present

## 2018-04-27 DIAGNOSIS — C7951 Secondary malignant neoplasm of bone: Secondary | ICD-10-CM | POA: Diagnosis present

## 2018-04-27 DIAGNOSIS — R531 Weakness: Secondary | ICD-10-CM | POA: Diagnosis not present

## 2018-04-27 DIAGNOSIS — Z419 Encounter for procedure for purposes other than remedying health state, unspecified: Secondary | ICD-10-CM

## 2018-04-27 DIAGNOSIS — G9619 Other disorders of meninges, not elsewhere classified: Secondary | ICD-10-CM

## 2018-04-27 DIAGNOSIS — G8220 Paraplegia, unspecified: Secondary | ICD-10-CM | POA: Diagnosis present

## 2018-04-27 DIAGNOSIS — Z9049 Acquired absence of other specified parts of digestive tract: Secondary | ICD-10-CM

## 2018-04-27 DIAGNOSIS — G959 Disease of spinal cord, unspecified: Secondary | ICD-10-CM | POA: Diagnosis present

## 2018-04-27 DIAGNOSIS — C50919 Malignant neoplasm of unspecified site of unspecified female breast: Secondary | ICD-10-CM

## 2018-04-27 DIAGNOSIS — C50412 Malignant neoplasm of upper-outer quadrant of left female breast: Secondary | ICD-10-CM | POA: Diagnosis not present

## 2018-04-27 DIAGNOSIS — K08409 Partial loss of teeth, unspecified cause, unspecified class: Secondary | ICD-10-CM | POA: Diagnosis present

## 2018-04-27 DIAGNOSIS — K5900 Constipation, unspecified: Secondary | ICD-10-CM | POA: Diagnosis present

## 2018-04-27 DIAGNOSIS — Z171 Estrogen receptor negative status [ER-]: Principal | ICD-10-CM

## 2018-04-27 DIAGNOSIS — Z923 Personal history of irradiation: Secondary | ICD-10-CM

## 2018-04-27 DIAGNOSIS — M4804 Spinal stenosis, thoracic region: Secondary | ICD-10-CM | POA: Diagnosis present

## 2018-04-27 DIAGNOSIS — Z79899 Other long term (current) drug therapy: Secondary | ICD-10-CM | POA: Diagnosis not present

## 2018-04-27 DIAGNOSIS — Z9012 Acquired absence of left breast and nipple: Secondary | ICD-10-CM | POA: Diagnosis not present

## 2018-04-27 DIAGNOSIS — C787 Secondary malignant neoplasm of liver and intrahepatic bile duct: Secondary | ICD-10-CM | POA: Diagnosis present

## 2018-04-27 DIAGNOSIS — R29898 Other symptoms and signs involving the musculoskeletal system: Secondary | ICD-10-CM

## 2018-04-27 DIAGNOSIS — Z853 Personal history of malignant neoplasm of breast: Secondary | ICD-10-CM | POA: Diagnosis not present

## 2018-04-27 DIAGNOSIS — Z9882 Breast implant status: Secondary | ICD-10-CM

## 2018-04-27 DIAGNOSIS — M542 Cervicalgia: Secondary | ICD-10-CM

## 2018-04-27 DIAGNOSIS — M8448XS Pathological fracture, other site, sequela: Secondary | ICD-10-CM | POA: Diagnosis not present

## 2018-04-27 DIAGNOSIS — M5136 Other intervertebral disc degeneration, lumbar region: Secondary | ICD-10-CM | POA: Diagnosis present

## 2018-04-27 DIAGNOSIS — S22039A Unspecified fracture of third thoracic vertebra, initial encounter for closed fracture: Secondary | ICD-10-CM

## 2018-04-27 DIAGNOSIS — G96198 Other disorders of meninges, not elsewhere classified: Secondary | ICD-10-CM

## 2018-04-27 LAB — BASIC METABOLIC PANEL
Anion gap: 13 (ref 5–15)
BUN: 12 mg/dL (ref 6–20)
CO2: 26 mmol/L (ref 22–32)
Calcium: 10 mg/dL (ref 8.9–10.3)
Chloride: 101 mmol/L (ref 98–111)
Creatinine, Ser: 0.88 mg/dL (ref 0.44–1.00)
GFR calc Af Amer: >60 mL/min (ref >60)
GFR calc non Af Amer: >60 mL/min (ref >60)
Glucose, Bld: 105 mg/dL — ABNORMAL HIGH (ref 70–99)
Potassium: 4.3 mmol/L (ref 3.5–5.1)
Sodium: 140 mmol/L (ref 135–145)

## 2018-04-27 LAB — CBC WITH DIFFERENTIAL/PLATELET
Abs Immature Granulocytes: 0.03 10*3/uL (ref 0.00–0.07)
Basophils Absolute: 0 10*3/uL (ref 0.0–0.1)
Basophils Relative: 0 %
Eosinophils Absolute: 0 10*3/uL (ref 0.0–0.5)
Eosinophils Relative: 0 %
HCT: 38.7 % (ref 36.0–46.0)
Hemoglobin: 12.1 g/dL (ref 12.0–15.0)
Immature Granulocytes: 0 %
Lymphocytes Relative: 7 %
Lymphs Abs: 0.6 10*3/uL — ABNORMAL LOW (ref 0.7–4.0)
MCH: 29.2 pg (ref 26.0–34.0)
MCHC: 31.3 g/dL (ref 30.0–36.0)
MCV: 93.5 fL (ref 80.0–100.0)
Monocytes Absolute: 0.2 10*3/uL (ref 0.1–1.0)
Monocytes Relative: 2 %
Neutro Abs: 7 10*3/uL (ref 1.7–7.7)
Neutrophils Relative %: 91 %
Platelets: 325 10*3/uL (ref 150–400)
RBC: 4.14 MIL/uL (ref 3.87–5.11)
RDW: 12.6 % (ref 11.5–15.5)
WBC: 7.8 10*3/uL (ref 4.0–10.5)
nRBC: 0 % (ref 0.0–0.2)

## 2018-04-27 LAB — PROTIME-INR
INR: 1
Prothrombin Time: 13.1 seconds (ref 11.4–15.2)

## 2018-04-27 MED ORDER — DEXAMETHASONE 4 MG PO TABS
ORAL_TABLET | ORAL | Status: AC
  Filled 2018-04-27: qty 5

## 2018-04-27 MED ORDER — OXYCODONE HCL 5 MG PO TABS
5.0000 mg | ORAL_TABLET | ORAL | Status: DC | PRN
  Administered 2018-04-27: 5 mg via ORAL
  Filled 2018-04-27: qty 1

## 2018-04-27 MED ORDER — ACETAMINOPHEN 325 MG PO TABS: 650.0000 mg | ORAL_TABLET | Freq: Four times a day (QID) | ORAL | Status: DC | PRN

## 2018-04-27 MED ORDER — POTASSIUM CHLORIDE IN NACL 20-0.9 MEQ/L-% IV SOLN
INTRAVENOUS | Status: DC
  Administered 2018-04-27 – 2018-04-28 (×2): via INTRAVENOUS
  Filled 2018-04-27 (×4): qty 1000

## 2018-04-27 MED ORDER — MORPHINE SULFATE (PF) 2 MG/ML IV SOLN
2.0000 mg | INTRAVENOUS | Status: DC | PRN
  Administered 2018-04-28: 4 mg via INTRAVENOUS
  Filled 2018-04-27: qty 2

## 2018-04-27 MED ORDER — DEXAMETHASONE 4 MG PO TABS: 20.0000 mg | ORAL_TABLET | Freq: Once | ORAL | Status: DC

## 2018-04-27 MED ORDER — DOCUSATE SODIUM 100 MG PO CAPS
100.0000 mg | ORAL_CAPSULE | Freq: Two times a day (BID) | ORAL | Status: DC
  Administered 2018-04-27: 100 mg via ORAL
  Filled 2018-04-27: qty 1

## 2018-04-27 MED ORDER — ACETAMINOPHEN 650 MG RE SUPP: 650.0000 mg | Freq: Four times a day (QID) | RECTAL | Status: DC | PRN

## 2018-04-27 MED ORDER — GADOBUTROL 1 MMOL/ML IV SOLN
7.5000 mL | Freq: Once | INTRAVENOUS | Status: AC | PRN
  Administered 2018-04-27: 5 mL via INTRAVENOUS

## 2018-04-27 MED ORDER — DEXAMETHASONE 4 MG PO TABS
20.0000 mg | ORAL_TABLET | Freq: Once | ORAL | Status: AC
  Administered 2018-04-27: 20 mg via ORAL

## 2018-04-27 NOTE — ED Triage Notes (Addendum)
Patient from MRI, sent for evaluation of pain from torso down bilateral legs x5 days. Hx breast cancer mets.

## 2018-04-27 NOTE — Progress Notes (Addendum)
Radiation Oncology Note  Dr. [name] called me this afternoon to let me know that Dawn Haynes was showing symptoms concerning for cord compression.  She was still ambulatory.  As the radiation oncologist on call I looked out for the stat results to her T spine MRI.  I have reviewed the report and images.  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  I highly recommend NSU consult for decompressive surgery as radiotherapy will not decompress the fractured bone.  If she is a surgical candidate this will give her the best opportunity to preserve her ability to walk.  However, she will need post operative RT.  I will inform my colleague Dr Lisbeth Renshaw of this as he has previously cared for the patient in radiation oncology.  Please call [phone] in the morning - any time at 8AM - to reach Dr. Lisbeth Renshaw or his PA, Shona Simpson - to discuss how their patient is doing. If surgery is not pursued, we can bring the patient downstairs to plan radiotherapy as soon as the department is open. Our CNS/spine navigator can also be reached at [phone] [place]).  The above was discussed with Rodell Perna PA-C of the ED.  She will consult NSU.  -----------------------------------  Eppie Gibson, MD

## 2018-04-27 NOTE — Telephone Encounter (Signed)
Pt able to come in at 330pm this afternoon. Pt is working and unable to make it at an earlier time.

## 2018-04-27 NOTE — ED Notes (Signed)
ED TO INPATIENT HANDOFF REPORT  Name/Age/Gender Dawn Haynes 37 y.o. female  Code Status    Code Status Orders  (From admission, onward)         Start     Ordered   04/27/18 2214  Full code  Continuous     04/27/18 2218        Code Status History    Date Active Date Inactive Code Status Order ID Comments User Context   12/23/2016 1130 12/23/2016 1250 Full Code [id number]  Wallace Going, DO Inpatient   07/24/2015 1719 07/29/2015 2350 Full Code [id number]  [name], MD Inpatient   07/18/2015 0401 07/24/2015 1719 Full Code [id number]  Theressa Millard, MD Inpatient      Home/SNF/Other Home  Chief Complaint back pain  Level of Care/Admitting Diagnosis ED Disposition    ED Disposition Condition [facility]: [facility] [100100]  Level of Care: Med-Surg [16]  Diagnosis: 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 progressive  PT Class (Do Not Modify): Inpatient [101]  PT Acc Code (Do Not Modify): Private [1]       Medical History Past Medical History:  Diagnosis Date  . Back ache   . Breast cancer ([place]) 07/09/2015   left breast  . Breast cancer of upper-outer quadrant of left female breast ([place]) 07/11/2015  . DDD (degenerative disc disease), lumbar    L1-2  . Genetic testing 01/12/2017   Ms. Eblin underwent genetic counseling and testing for hereditary cancer syndromes on 01/05/2017. Her results were negative for mutations in all 46 genes analyzed by [facility]'s 46-gene Common Hereditary Cancers Panel. Genes analyzed include: APC, ATM, AXIN2, BARD1, BMPR1A, BRCA1, BRCA2, BRIP1, CDH1, CDKN2A, CHEK2, CTNNA1, DICER1, EPCAM, GREM1,  HOXB13, KIT, MEN1, MLH1, MSH2, MSH3, MSH6, MUTYH, NBN,   . Genetic testing of female    [facility] panel negative 01/2017  . History of blood transfusion    "when I lost alot of blood when I went into premature labor"; no abnormal reaction noted  . Weakness    numbness and tingling in both hands    Allergies No Known Allergies  IV Location/Drains/Wounds Patient Lines/Drains/Airways Status   Active Line/Drains/Airways    Name:   Placement date:   Placement time:   Site:   Days:   Implanted Port 07/24/15 Right Chest   07/24/15    1538    Chest   1008   Peripheral IV 04/27/18 Right Forearm   04/27/18    2024    Forearm   less than 1   Closed System Drain 1 Left Breast Bulb (JP) 19 Fr.   12/23/16    1058    Breast   490   Incision 01/22/13 Abdomen Other (Comment)   01/22/13    2044     1921   Incision (Closed) 07/24/15 Chest Right   07/24/15    1550     1008   Incision (Closed) 11/09/16 Breast Left   11/09/16    1229     534   Incision (Closed) 12/23/16 Breast Left   12/23/16    1115     490   Incision (Closed) 02/25/17 Breast Left   02/25/17    0934  426   Incision (Closed) 02/25/17 Neck Right   02/25/17    0934     426   Incision (Closed) 11/10/17 Abdomen Other (Comment)   11/10/17    1013     168   Incision (Closed) 11/10/17 Chest Left   11/10/17    1013     168   Incision (Closed) 11/10/17 Chest Right   11/10/17    1013     168   Incision - 1 Port   07/26/15    -     1006          Labs/Imaging Results for orders placed or performed during the hospital encounter of 04/27/18 (from the past 48 hour(s))  Basic metabolic panel     Status: Abnormal   Collection Time: 04/27/18  8:22 PM  Result Value Ref Range   Sodium 140 135 - 145 mmol/L   Potassium 4.3 3.5 - 5.1 mmol/L   Chloride 101 98 - 111 mmol/L   CO2 26 22 - 32 mmol/L   Glucose, Bld 105 (H) 70 - 99 mg/dL   BUN 12 6 - 20 mg/dL   Creatinine, Ser 0.88 0.44 - 1.00 mg/dL   Calcium 10.0 8.9 - 10.3 mg/dL   GFR calc non Af Amer  >60 >60 mL/min   GFR calc Af Amer >60 >60 mL/min    Comment: (NOTE) The eGFR has been calculated using the CKD EPI equation. This calculation has not been validated in all clinical situations. eGFR's persistently <60 mL/min signify possible Chronic Kidney Disease.    Anion gap 13 5 - 15    Comment: Performed at [facility], [place] [address], [place], [place] [address]  CBC with Differential     Status: Abnormal   Collection Time: 04/27/18  8:22 PM  Result Value Ref Range   WBC 7.8 4.0 - 10.5 K/uL   RBC 4.14 3.87 - 5.11 MIL/uL   Hemoglobin 12.1 12.0 - 15.0 g/dL   HCT 38.7 36.0 - 46.0 %   MCV 93.5 80.0 - 100.0 fL   MCH 29.2 26.0 - 34.0 pg   MCHC 31.3 30.0 - 36.0 g/dL   RDW 12.6 11.5 - 15.5 %   Platelets 325 150 - 400 K/uL   nRBC 0.0 0.0 - 0.2 %   Neutrophils Relative % 91 %   Neutro Abs 7.0 1.7 - 7.7 K/uL   Lymphocytes Relative 7 %   Lymphs Abs 0.6 (L) 0.7 - 4.0 K/uL   Monocytes Relative 2 %   Monocytes Absolute 0.2 0.1 - 1.0 K/uL   Eosinophils Relative 0 %   Eosinophils Absolute 0.0 0.0 - 0.5 K/uL   Basophils Relative 0 %   Basophils Absolute 0.0 0.0 - 0.1 K/uL   WBC Morphology VACUOLATED NEUTROPHILS    Immature Granulocytes 0 %   Abs Immature Granulocytes 0.03 0.00 - 0.07 K/uL    Comment: Performed at [facility], [place] [address], [place], [place] [address]  Protime-INR     Status: None   Collection Time: 04/27/18  8:22 PM  Result Value Ref Range   Prothrombin Time 13.1 11.4 - 15.2 seconds   INR 1.00     Comment: Performed at [facility], [place] [address], [place], [place] [address]   Mr Thoracic Spine W Wo Contrast  Result Date: 04/27/2018 CLINICAL DATA:  Malignant neoplasm. Sensory level, evaluate for cord compression. EXAM: MRI THORACIC WITHOUT AND WITH CONTRAST TECHNIQUE: Multiplanar and multiecho pulse  sequences of the thoracic spine were obtained without and with intravenous contrast. CONTRAST:  5 cc Gadavist  intravenous COMPARISON:  None. FINDINGS: MRI THORACIC SPINE FINDINGS Alignment:  Kyphotic deformity due to T3 compression fracture. Vertebrae: There is infiltrative tumor appearance within the T3 and T4 bodies and posterior elements with extraosseous tumor infiltrating the left more than right T3-4 and T4-5 foramina. There is circumferential epidural tumor at the level of T3 with dorsal epidural tumor extending to the level T1 to T8 (based on postcontrast sagittal images). Cord: Cord compression at T3 due to compression fracture with posterior bowing of the cortex and due to epidural tumor. No visible cord edema. Paraspinal and other soft tissues: Left paravertebral tumor extending into the left third and fourth ribs. Known metastatic disease to the liver. Disc levels: No degenerative changes. Critical Value/emergent results were called by telephone at the time of interpretation on 04/27/2018 at 6:33 pm to Dr. Nicholas Lose , who verbally acknowledged these results. Patient is being transferred to the ER, with who is been notified. 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 Metastatic deposit diffusely infiltrates the T3 and T4 vertebrae with pathologic fracture of the T3 vertebra contributing to cord compression. Extensive epidural tumor spread seen dorsally from T1-T8 (see postcontrast sagittal imaging). T3-4 and T4-5 foraminal compromise by tumor. There is asymmetric left paravertebral extension which infiltrates the left third and fourth ribs. Electronically Signed   By: Monte Fantasia M.D.   On: 04/27/2018 18:41   None  Pending Labs Unresulted Labs (From admission, onward)    Start     Ordered   04/27/18 2211  HIV antibody (Routine Testing)  Once,   R     04/27/18 2218          Vitals/Pain Today's Vitals   04/27/18 1941 04/27/18 2025 04/27/18 2025 04/27/18 2200  BP: 108/80  121/82 117/75  Pulse: 97  69   Resp: '18  15 15  '$ Temp: 99.1 F (37.3 C)     TempSrc: Oral     SpO2: 99%  100%    PainSc:  0-No pain      Isolation Precautions No active isolations  Medications Medications  0.9 % NaCl with KCl 20 mEq/ L  infusion (has no administration in time range)  acetaminophen (TYLENOL) tablet 650 mg (has no administration in time range)    Or  acetaminophen (TYLENOL) suppository 650 mg (has no administration in time range)  oxyCODONE (Oxy IR/ROXICODONE) immediate release tablet 5 mg (has no administration in time range)  docusate sodium (COLACE) capsule 100 mg (has no administration in time range)  morphine 2 MG/ML injection 2-4 mg (has no administration in time range)    Mobility non-ambulatory- pt on bed rest

## 2018-04-27 NOTE — ED Provider Notes (Signed)
[facility] DEPT Provider Note   CSN: [id number] Arrival date & time: 04/27/18  1936     History   Chief Complaint Chief Complaint  Patient presents with  . Back Pain    HPI Dawn Haynes is a 37 y.o. female with history of left upper outer breast cancer, degenerative disc disease, spina bifida presents today for evaluation of acute onset, progressively worsening paresthesias and weakness for 5 days.  She states that approximately 1 week ago she developed left upper extremity and thoracic discomfort secondary to lymphedema which improved with the use of a supportive bra.  5 days ago she noticed sudden altered sensation from just under her breast down to her toes.  This is constant and she describes it as feeling "like goosebumps ".  She also notes that she has been having difficulty ambulating for the past 5 days.  She had some constipation but has had normal bowel movements over the past 2 days.  Denies true urinary incontinence but states "when I have to go I have to go right away or else I won't make it".  She notes saddle anesthesia and involuntary muscle twitches in her lower extremities.  Denies fevers or IV drug use.  Notes shortness of breath with ambulation, denies chest pain, nausea, vomiting, lightheadedness, dizziness, or headaches.  She is not currently undergoing radiation or chemotherapy.  Went to see Dr. [name] the oncologist today who ordered a stat thoracic spine MRI which showed malignant cord compression at T3 with metastatic deposit if usually infiltrating the T3 and T4 vertebra with pathologic fracture of the T3 vertebra contributing to cord compression.  She has extensive epidural tumor spread seen dorsally from T1-T8 with T3-4 and T-4-5 foraminal compromise by the tumor. She did receive '20mg'$  PO dexamethasone in Dr. Geralyn Flash office.   The history is provided by the patient.    Past Medical History:  Diagnosis Date  . Back ache   . Breast  cancer ([place]) 07/09/2015   left breast  . Breast cancer of upper-outer quadrant of left female breast ([place]) 07/11/2015  . DDD (degenerative disc disease), lumbar    L1-2  . Genetic testing 01/12/2017   Ms. Buckman underwent genetic counseling and testing for hereditary cancer syndromes on 01/05/2017. Her results were negative for mutations in all 46 genes analyzed by [facility]'s 46-gene Common Hereditary Cancers Panel. Genes analyzed include: APC, ATM, AXIN2, BARD1, BMPR1A, BRCA1, BRCA2, BRIP1, CDH1, CDKN2A, CHEK2, CTNNA1, DICER1, EPCAM, GREM1, HOXB13, KIT, MEN1, MLH1, MSH2, MSH3, MSH6, MUTYH, NBN,   . Genetic testing of female    [facility] panel negative 01/2017  . History of blood transfusion    "when I lost alot of blood when I went into premature labor"; no abnormal reaction noted  . Weakness    numbness and tingling in both hands    Patient Active Problem List   Diagnosis Date Noted  . 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 Genetic testing 01/12/2017  . Breast cancer ([place]) 12/24/2016  . Acquired absence of breast and absent nipple, left 12/23/2016  . Hyponatremia 07/29/2015  . Hyperkalemia 07/29/2015  . Transaminitis 07/29/2015  . Ascites, malignant   . Malignant ascites   . Port catheter in place   . Metastasis to liver ([place]) 07/24/2015  . Ascites 07/17/2015  . SBP (spontaneous bacterial peritonitis) ([place]) 07/17/2015  . Abdominal pain 07/17/2015  . SOB (shortness of breath) 07/17/2015  . DDD (degenerative disc disease), lumbar 07/17/2015  .  Severe sepsis ([place])   . Breast cancer of upper-outer quadrant of left female breast ([place]) 07/11/2015    Past Surgical History:  Procedure Laterality Date  . BREAST BIOPSY Left 2018  . BREAST LUMPECTOMY WITH RADIOACTIVE SEED AND SENTINEL LYMPH NODE BIOPSY Left 10/28/2016   Procedure: LEFT BREAST LUMPECTOMY WITH BRACKETED RADIOACTIVE SEED AND SENTINEL LYMPH NODE BIOPSY;  Surgeon: [name], MD;   Location: [facility];  Service: General;  Laterality: Left;  . BREAST RECONSTRUCTION WITH PLACEMENT OF TISSUE EXPANDER AND FLEX HD (ACELLULAR HYDRATED DERMIS) Left 12/23/2016  . BREAST RECONSTRUCTION WITH PLACEMENT OF TISSUE EXPANDER AND FLEX HD (ACELLULAR HYDRATED DERMIS) Left 12/23/2016   Procedure: IMMEDIATE LEFT BREAST RECONSTRUCTION WITH PLACEMENT OF TISSUE EXPANDER AND FLEX HD (ACELLULAR HYDRATED DERMIS);  Surgeon: Wallace Going, DO;  Location: [facility];  Service: Plastics;  Laterality: Left;  . CESAREAN SECTION N/A 01/22/2013   Procedure: primary CESAREAN SECTION of baby boy at 2001;  Surgeon: Cheri Fowler, MD;  Location: [facility];  Service: Obstetrics;  Laterality: N/A;  . DILATION AND CURETTAGE OF UTERUS    . HERNIA REPAIR     UHR  . INDUCED ABORTION    . LAPAROSCOPIC CHOLECYSTECTOMY  Aug 2000  . LIPOSUCTION WITH LIPOFILLING Left 11/10/2017   Procedure: FAT GRAFTING TO LEFT UPPER POLL CHEST;  Surgeon: Wallace Going, DO;  Location: [facility];  Service: Plastics;  Laterality: Left;  Marland Kitchen MASTECTOMY Left 12/23/2016  . PARACENTESIS  07/17/2015   tube put in to drain fluid from near liver; US guided/notes 07/17/2015  . PLACEMENT OF BREAST IMPLANTS Right 11/10/2017   Procedure: PLACEMENT OF BREAST IMPLANT RIGHT;  Surgeon: Wallace Going, DO;  Location: [facility];  Service: Plastics;  Laterality: Right;  . PORT-A-CATH REMOVAL Right 02/25/2017   Procedure: REMOVAL PORT-A-CATH;  Surgeon: Wallace Going, DO;  Location: [facility];  Service: Plastics;  Laterality: Right;  . PORTACATH PLACEMENT Right 07/24/2015   Procedure: INSERTION PORT-A-CATH RIGHT SUBCLAVIAN;  Surgeon: [name], MD;  Location: [facility];  Service: General;  Laterality: Right;  . RE-EXCISION OF BREAST LUMPECTOMY Left 11/09/2016   Procedure: RE-EXCISION OF BREAST LUMPECTOMY;  Surgeon: [name], MD;  Location: [facility];  Service: General;  Laterality:  Left;  . REMOVAL OF TISSUE EXPANDER AND PLACEMENT OF IMPLANT Left 02/25/2017   Procedure: REMOVAL OF LEFT BREAST  TISSUE EXPANDER AND PLACEMENT OF LEFT SILICONE IMPLANT;  Surgeon: Wallace Going, DO;  Location: [facility];  Service: Plastics;  Laterality: Left;  . TOTAL MASTECTOMY Left 12/23/2016   Procedure: LEFT MASTECTOMY;  Surgeon: [name], MD;  Location: [facility];  Service: General;  Laterality: Left;  . UMBILICAL HERNIA REPAIR  ~ 1983  . WISDOM TOOTH EXTRACTION  2002     OB History    Gravida  15   Para  7   Term  4   Preterm  3   AB  7   Living  6     SAB  6   TAB  1   Ectopic      Multiple      Live Births  7            Home Medications    Prior to Admission medications   Medication Sig Start Date End Date Taking? Authorizing Provider  ALPHA LIPOIC ACID PO Take 1 capsule by mouth daily.   Yes [provider]  Multiple Vitamin (MULTIVITAMIN) tablet Take  1 tablet by mouth daily.   Yes [provider]  TURMERIC PO Take 1 capsule by mouth daily.   Yes [provider]    Family History Family History  Problem Relation Age of Onset  . Diabetes Maternal Grandmother     Social History Social History   Tobacco Use  . Smoking status: Never Smoker  . Smokeless tobacco: Never Used  Substance Use Topics  . Alcohol use: Yes    Comment: 12/23/2016 "might have a drink a few times/year"  . Drug use: No     Allergies   Patient has no known allergies.   Review of Systems Review of Systems  Constitutional: Negative for chills and fever.  Musculoskeletal: Positive for back pain.  Neurological: Positive for weakness and numbness. Negative for dizziness.  All other systems reviewed and are negative.    Physical Exam Updated Vital Signs BP 117/75   Pulse 69   Temp 99.1 F (37.3 C) (Oral)   Resp 15   LMP 04/07/2018   SpO2 100%   Physical Exam  Constitutional: She appears well-developed and  well-nourished. No distress.  HENT:  Head: Normocephalic and atraumatic.  Eyes: Conjunctivae are normal. Right eye exhibits no discharge. Left eye exhibits no discharge.  Neck: No JVD present. No tracheal deviation present.  Cardiovascular: Normal rate.  Pulmonary/Chest: Effort normal.  Abdominal: She exhibits no distension.  Musculoskeletal: She exhibits no edema.  No midline spine TTP, no paraspinal muscle tenderness, no deformity, crepitus, or step-off noted. 3/5 strength of bilateral hip flexors, otherwise 5/5 strength of BLE major muscle groups.  Examination performed in the presence of a chaperone.  No rectal tone noted on DRE  Neurological: She is alert.  Reflex Scores:      Patellar reflexes are 3+ on the right side and 3+ on the left side.      Achilles reflexes are 3+ on the right side and 3+ on the left side. Fluent speech, no facial droop, altered sensation to soft touch from the level of T3/T4 down.  Ambulates with an unsteady gait, unable to heel walk or toe walk.  Skin: Skin is warm and dry. No erythema.  Psychiatric: She has a normal mood and affect. Her behavior is normal.  Nursing note and vitals reviewed.    ED Treatments / Results  Labs (all labs ordered are listed, but only abnormal results are displayed) Labs Reviewed  BASIC METABOLIC PANEL - Abnormal; Notable for the following components:      Result Value   Glucose, Bld 105 (*)    All other components within normal limits  CBC WITH DIFFERENTIAL/PLATELET - Abnormal; Notable for the following components:   Lymphs Abs 0.6 (*)    All other components within normal limits  PROTIME-INR  HIV ANTIBODY (ROUTINE TESTING W REFLEX)    EKG None  Radiology Mr Thoracic Spine W Wo Contrast  Result Date: 04/27/2018 CLINICAL DATA:  Malignant neoplasm. Sensory level, evaluate for cord compression. EXAM: MRI THORACIC WITHOUT AND WITH CONTRAST TECHNIQUE: Multiplanar and multiecho pulse sequences of the thoracic spine  were obtained without and with intravenous contrast. CONTRAST:  5 cc Gadavist intravenous COMPARISON:  None. FINDINGS: MRI THORACIC SPINE FINDINGS Alignment:  Kyphotic deformity due to T3 compression fracture. Vertebrae: There is infiltrative tumor appearance within the T3 and T4 bodies and posterior elements with extraosseous tumor infiltrating the left more than right T3-4 and T4-5 foramina. There is circumferential epidural tumor at the level of T3 with dorsal epidural tumor  extending to the level T1 to T8 (based on postcontrast sagittal images). Cord: Cord compression at T3 due to compression fracture with posterior bowing of the cortex and due to epidural tumor. No visible cord edema. Paraspinal and other soft tissues: Left paravertebral tumor extending into the left third and fourth ribs. Known metastatic disease to the liver. Disc levels: No degenerative changes. Critical Value/emergent results were called by telephone at the time of interpretation on 04/27/2018 at 6:33 pm to Dr. Nicholas Lose , who verbally acknowledged these results. Patient is being transferred to the ER, with who is been notified. 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 Metastatic deposit diffusely infiltrates the T3 and T4 vertebrae with pathologic fracture of the T3 vertebra contributing to cord compression. Extensive epidural tumor spread seen dorsally from T1-T8 (see postcontrast sagittal imaging). T3-4 and T4-5 foraminal compromise by tumor. There is asymmetric left paravertebral extension which infiltrates the left third and fourth ribs. Electronically Signed   By: Monte Fantasia M.D.   On: 04/27/2018 18:41    Procedures Procedures (including critical care time)  Medications Ordered in ED Medications  0.9 % NaCl with KCl 20 mEq/ L  infusion (has no administration in time range)  acetaminophen (TYLENOL) tablet 650 mg (has no administration in time range)    Or  acetaminophen (TYLENOL) suppository 650 mg (has no  administration in time range)  oxyCODONE (Oxy IR/ROXICODONE) immediate release tablet 5 mg (has no administration in time range)  docusate sodium (COLACE) capsule 100 mg (has no administration in time range)  morphine 2 MG/ML injection 2-4 mg (has no administration in time range)     Initial Impression / Assessment and Plan / ED Course  I have reviewed the triage vital signs and the nursing notes.  Pertinent labs & imaging results that were available during my care of the patient were reviewed by me and considered in my medical decision making (see chart for details).    Patient presents from MRI for evaluation of epidural mass, acute malignant T3 cord compression, and pathologic T3 vertebral fracture.  She is afebrile, vital signs are stable.  She is nontoxic in appearance.  She has altered sensation to soft touch from the mid thoracic region down to her toes.  Decreased rectal tone and hyperreflexia noted on examination.  She has an abnormal gait.  She received p.o. dexamethasone with Dr. [name] with oncology earlier today.  Spoke with Dr. Earlie Server with oncology who recommends admission, 6 mg Decadron IV or p.o. every 6 hours, and neurosurgical consultation with possible emergent radiation therapy.  Spoke with Dr. Isidore Moos with radiation oncology who feels strongly that repair of the vertebral fracture causing worsening impingement is imperative to patient's recovery if she is a surgical candidate, but will require radiation therapy postoperatively.  Spoke with Dr. Arnoldo Morale of neurosurgery who agrees to assume care of the patient and bring her into the hospital for further evaluation and management.  Plan for surgery in the morning.  Patient seen and evaluated by Dr. Laverta Baltimore who agrees with assessment and plan at this time.  Final Clinical Impressions(s) / ED Diagnoses   Final diagnoses:  Closed fracture of third thoracic vertebra, unspecified fracture morphology, initial encounter [facility])  Epidural mass     ED Discharge Orders    None       Renita Papa, PA-C 04/27/18 2352    Margette Fast, MD 04/28/18 [phone]
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
## 2018-04-27 NOTE — ED Notes (Signed)
Spoke to pt placement, there are no beds available at this time. Pt will be holding in our ED tonight.
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
## 2018-04-27 NOTE — Telephone Encounter (Signed)
This RN administered oral decadron per MD order due to concern for possible cord compression.

## 2018-04-27 NOTE — ED Notes (Signed)
Bed: UP10 Expected date:  Expected time:  Means of arrival:  Comments: [place]

## 2018-04-27 NOTE — Telephone Encounter (Signed)
Called pt and lvm with call back number to schedule an appt to see Dr.Gudena.

## 2018-04-28 ENCOUNTER — Inpatient Hospital Stay (HOSPITAL_COMMUNITY): Payer: Medicaid Other

## 2018-04-28 ENCOUNTER — Inpatient Hospital Stay (HOSPITAL_COMMUNITY): Payer: Medicaid Other | Admitting: Certified Registered Nurse Anesthetist

## 2018-04-28 ENCOUNTER — Encounter (HOSPITAL_COMMUNITY): Payer: Self-pay | Admitting: Anesthesiology

## 2018-04-28 ENCOUNTER — Inpatient Hospital Stay (HOSPITAL_COMMUNITY)
Admission: EM | Disposition: A | Discharge status: Home / Self Care | Payer: Self-pay | Source: Home / Self Care | Attending: Neurosurgery

## 2018-04-28 ENCOUNTER — Encounter: Payer: Self-pay | Admitting: Radiation Oncology

## 2018-04-28 DIAGNOSIS — G992 Myelopathy in diseases classified elsewhere: Secondary | ICD-10-CM

## 2018-04-28 DIAGNOSIS — G8220 Paraplegia, unspecified: Secondary | ICD-10-CM

## 2018-04-28 DIAGNOSIS — M8458XA Pathological fracture in neoplastic disease, other specified site, initial encounter for fracture: Secondary | ICD-10-CM

## 2018-04-28 DIAGNOSIS — C7951 Secondary malignant neoplasm of bone: Secondary | ICD-10-CM

## 2018-04-28 DIAGNOSIS — M8448XA Pathological fracture, other site, initial encounter for fracture: Secondary | ICD-10-CM | POA: Diagnosis present

## 2018-04-28 HISTORY — PX: LAMINECTOMY: SHX219

## 2018-04-28 HISTORY — PX: STERNOTOMY: SHX1057

## 2018-04-28 LAB — I-STAT BETA HCG BLOOD, ED (MC, WL, AP ONLY): I-stat hCG, quantitative: <5 m[IU]/mL (ref <5)

## 2018-04-28 LAB — PREPARE RBC (CROSSMATCH)

## 2018-04-28 LAB — MRSA PCR SCREENING: MRSA by PCR: NEGATIVE

## 2018-04-28 LAB — HIV ANTIBODY (ROUTINE TESTING W REFLEX): HIV Screen 4th Generation wRfx: NONREACTIVE

## 2018-04-28 SURGERY — THORACIC LAMINECTOMY FOR TUMOR
Anesthesia: General | Site: Chest

## 2018-04-28 MED ORDER — LACTATED RINGERS IV SOLN
INTRAVENOUS | Status: DC | PRN
  Administered 2018-04-28 (×2): via INTRAVENOUS

## 2018-04-28 MED ORDER — THROMBIN 5000 UNITS EX SOLR
CUTANEOUS | Status: AC
  Filled 2018-04-28: qty 5000

## 2018-04-28 MED ORDER — SODIUM CHLORIDE 0.9 % IV SOLN
INTRAVENOUS | Status: DC | PRN
  Administered 2018-04-28: 25 ug/min via INTRAVENOUS

## 2018-04-28 MED ORDER — PANTOPRAZOLE SODIUM 20 MG PO TBEC: 20.0000 mg | DELAYED_RELEASE_TABLET | Freq: Two times a day (BID) | ORAL | Status: DC

## 2018-04-28 MED ORDER — MENTHOL 3 MG MT LOZG: 1.0000 | LOZENGE | OROMUCOSAL | Status: DC | PRN

## 2018-04-28 MED ORDER — THROMBIN 20000 UNITS EX SOLR
CUTANEOUS | Status: DC | PRN
  Administered 2018-04-28: 15:00:00 via TOPICAL

## 2018-04-28 MED ORDER — PHENYLEPHRINE 40 MCG/ML (10ML) SYRINGE FOR IV PUSH (FOR BLOOD PRESSURE SUPPORT)
PREFILLED_SYRINGE | INTRAVENOUS | Status: AC
  Filled 2018-04-28: qty 10

## 2018-04-28 MED ORDER — DOCUSATE SODIUM 100 MG PO CAPS
100.0000 mg | ORAL_CAPSULE | Freq: Two times a day (BID) | ORAL | Status: DC
  Administered 2018-04-28 – 2018-04-30 (×3): 100 mg via ORAL
  Filled 2018-04-28 (×4): qty 1

## 2018-04-28 MED ORDER — FENTANYL CITRATE (PF) 100 MCG/2ML IJ SOLN
INTRAMUSCULAR | Status: AC
  Administered 2018-04-28: 50 ug via INTRAVENOUS
  Filled 2018-04-28: qty 2

## 2018-04-28 MED ORDER — ROCURONIUM BROMIDE 10 MG/ML (PF) SYRINGE
PREFILLED_SYRINGE | INTRAVENOUS | Status: DC | PRN
  Administered 2018-04-28 (×2): 20 mg via INTRAVENOUS
  Administered 2018-04-28: 50 mg via INTRAVENOUS

## 2018-04-28 MED ORDER — PROPOFOL 10 MG/ML IV BOLUS
INTRAVENOUS | Status: AC
  Filled 2018-04-28: qty 40

## 2018-04-28 MED ORDER — DEXAMETHASONE 2 MG PO TABS
2.0000 mg | ORAL_TABLET | Freq: Four times a day (QID) | ORAL | Status: AC
  Administered 2018-04-28 (×2): 2 mg via ORAL
  Filled 2018-04-28 (×2): qty 1

## 2018-04-28 MED ORDER — LACTATED RINGERS IV SOLN
INTRAVENOUS | Status: DC
  Administered 2018-04-28: 12:00:00 via INTRAVENOUS

## 2018-04-28 MED ORDER — DEXAMETHASONE SODIUM PHOSPHATE 4 MG/ML IJ SOLN
2.0000 mg | Freq: Four times a day (QID) | INTRAMUSCULAR | Status: AC
  Filled 2018-04-28: qty 1

## 2018-04-28 MED ORDER — OXYCODONE HCL 5 MG PO TABS: 5.0000 mg | ORAL_TABLET | Freq: Once | ORAL | Status: DC | PRN

## 2018-04-28 MED ORDER — LIDOCAINE 2% (20 MG/ML) 5 ML SYRINGE
INTRAMUSCULAR | Status: AC
  Filled 2018-04-28: qty 5

## 2018-04-28 MED ORDER — ONDANSETRON HCL 4 MG/2ML IJ SOLN: 4.0000 mg | Freq: Four times a day (QID) | INTRAMUSCULAR | Status: DC | PRN

## 2018-04-28 MED ORDER — PROPOFOL 10 MG/ML IV BOLUS
INTRAVENOUS | Status: DC | PRN
  Administered 2018-04-28: 130 mg via INTRAVENOUS

## 2018-04-28 MED ORDER — ALUM & MAG HYDROXIDE-SIMETH 200-200-20 MG/5ML PO SUSP: 30.0000 mL | Freq: Four times a day (QID) | ORAL | Status: DC | PRN

## 2018-04-28 MED ORDER — SODIUM CHLORIDE 0.9% IV SOLUTION: Freq: Once | INTRAVENOUS | Status: DC

## 2018-04-28 MED ORDER — ACETAMINOPHEN 500 MG PO TABS
1000.0000 mg | ORAL_TABLET | Freq: Four times a day (QID) | ORAL | Status: AC
  Administered 2018-04-28 – 2018-04-29 (×4): 1000 mg via ORAL
  Filled 2018-04-28 (×4): qty 2

## 2018-04-28 MED ORDER — PANTOPRAZOLE SODIUM 20 MG PO TBEC
20.0000 mg | DELAYED_RELEASE_TABLET | Freq: Two times a day (BID) | ORAL | Status: DC
  Administered 2018-04-28 – 2018-04-30 (×4): 20 mg via ORAL
  Filled 2018-04-28 (×4): qty 1

## 2018-04-28 MED ORDER — BACITRACIN ZINC 500 UNIT/GM EX OINT
TOPICAL_OINTMENT | CUTANEOUS | Status: AC
  Filled 2018-04-28: qty 28.35

## 2018-04-28 MED ORDER — CEFAZOLIN SODIUM-DEXTROSE 2-4 GM/100ML-% IV SOLN
2.0000 g | Freq: Three times a day (TID) | INTRAVENOUS | Status: AC
  Administered 2018-04-28 – 2018-04-29 (×2): 2 g via INTRAVENOUS
  Filled 2018-04-28 (×2): qty 100

## 2018-04-28 MED ORDER — SUGAMMADEX SODIUM 200 MG/2ML IV SOLN
INTRAVENOUS | Status: DC | PRN
  Administered 2018-04-28: 110 mg via INTRAVENOUS

## 2018-04-28 MED ORDER — ACETAMINOPHEN 650 MG RE SUPP: 650.0000 mg | RECTAL | Status: DC | PRN

## 2018-04-28 MED ORDER — MIDAZOLAM HCL 2 MG/2ML IJ SOLN
INTRAMUSCULAR | Status: AC
  Administered 2018-04-28: 1 mg via INTRAVENOUS
  Filled 2018-04-28: qty 2

## 2018-04-28 MED ORDER — MIDAZOLAM HCL 2 MG/2ML IJ SOLN
INTRAMUSCULAR | Status: AC
  Filled 2018-04-28: qty 2

## 2018-04-28 MED ORDER — VANCOMYCIN HCL 1000 MG IV SOLR
INTRAVENOUS | Status: AC
  Filled 2018-04-28: qty 1000

## 2018-04-28 MED ORDER — PHENYLEPHRINE 40 MCG/ML (10ML) SYRINGE FOR IV PUSH (FOR BLOOD PRESSURE SUPPORT)
PREFILLED_SYRINGE | INTRAVENOUS | Status: DC | PRN
  Administered 2018-04-28: 80 ug via INTRAVENOUS

## 2018-04-28 MED ORDER — MIDAZOLAM HCL 2 MG/2ML IJ SOLN
1.0000 mg | Freq: Once | INTRAMUSCULAR | Status: AC
  Administered 2018-04-28: 1 mg via INTRAVENOUS

## 2018-04-28 MED ORDER — FENTANYL CITRATE (PF) 100 MCG/2ML IJ SOLN
50.0000 ug | Freq: Once | INTRAMUSCULAR | Status: AC
  Administered 2018-04-28: 50 ug via INTRAVENOUS

## 2018-04-28 MED ORDER — CEFAZOLIN SODIUM-DEXTROSE 2-3 GM-%(50ML) IV SOLR
INTRAVENOUS | Status: DC | PRN
  Administered 2018-04-28: 2 g via INTRAVENOUS

## 2018-04-28 MED ORDER — FENTANYL CITRATE (PF) 250 MCG/5ML IJ SOLN
INTRAMUSCULAR | Status: AC
  Filled 2018-04-28: qty 5

## 2018-04-28 MED ORDER — DEXAMETHASONE SODIUM PHOSPHATE 10 MG/ML IJ SOLN
10.0000 mg | Freq: Four times a day (QID) | INTRAMUSCULAR | Status: DC
  Administered 2018-04-28: 10 mg via INTRAVENOUS
  Filled 2018-04-28: qty 1

## 2018-04-28 MED ORDER — 0.9 % SODIUM CHLORIDE (POUR BTL) OPTIME
TOPICAL | Status: DC | PRN
  Administered 2018-04-28: 1000 mL

## 2018-04-28 MED ORDER — BUPIVACAINE-EPINEPHRINE 0.5% -1:200000 IJ SOLN
INTRAMUSCULAR | Status: DC | PRN
  Administered 2018-04-28: 10 mL

## 2018-04-28 MED ORDER — ONDANSETRON HCL 4 MG/2ML IJ SOLN
INTRAMUSCULAR | Status: AC
  Filled 2018-04-28: qty 2

## 2018-04-28 MED ORDER — OXYCODONE HCL 5 MG/5ML PO SOLN: 5.0000 mg | Freq: Once | ORAL | Status: DC | PRN

## 2018-04-28 MED ORDER — THROMBIN 5000 UNITS EX SOLR
OROMUCOSAL | Status: DC | PRN
  Administered 2018-04-28: 15:00:00 via TOPICAL

## 2018-04-28 MED ORDER — LACTATED RINGERS IV SOLN: INTRAVENOUS | Status: DC | PRN

## 2018-04-28 MED ORDER — ACETAMINOPHEN 325 MG PO TABS: 650.0000 mg | ORAL_TABLET | ORAL | Status: DC | PRN

## 2018-04-28 MED ORDER — ONDANSETRON HCL 4 MG PO TABS: 4.0000 mg | ORAL_TABLET | Freq: Four times a day (QID) | ORAL | Status: DC | PRN

## 2018-04-28 MED ORDER — FENTANYL CITRATE (PF) 100 MCG/2ML IJ SOLN
25.0000 ug | INTRAMUSCULAR | Status: DC | PRN
  Administered 2018-04-28 (×2): 50 ug via INTRAVENOUS

## 2018-04-28 MED ORDER — MORPHINE SULFATE (PF) 4 MG/ML IV SOLN: 4.0000 mg | INTRAVENOUS | Status: DC | PRN

## 2018-04-28 MED ORDER — THROMBIN (RECOMBINANT) 20000 UNITS EX SOLR
CUTANEOUS | Status: AC
  Filled 2018-04-28: qty 20000

## 2018-04-28 MED ORDER — ROCURONIUM BROMIDE 50 MG/5ML IV SOSY
PREFILLED_SYRINGE | INTRAVENOUS | Status: AC
  Filled 2018-04-28: qty 5

## 2018-04-28 MED ORDER — DEXAMETHASONE SODIUM PHOSPHATE 10 MG/ML IJ SOLN
INTRAMUSCULAR | Status: DC | PRN
  Administered 2018-04-28: 10 mg via INTRAVENOUS

## 2018-04-28 MED ORDER — CYCLOBENZAPRINE HCL 10 MG PO TABS: 10.0000 mg | ORAL_TABLET | Freq: Three times a day (TID) | ORAL | Status: DC | PRN

## 2018-04-28 MED ORDER — BISACODYL 10 MG RE SUPP: 10.0000 mg | Freq: Every day | RECTAL | Status: DC | PRN

## 2018-04-28 MED ORDER — DEXAMETHASONE SODIUM PHOSPHATE 10 MG/ML IJ SOLN
INTRAMUSCULAR | Status: AC
  Filled 2018-04-28: qty 1

## 2018-04-28 MED ORDER — HEMOSTATIC AGENTS (NO CHARGE) OPTIME
TOPICAL | Status: DC | PRN
  Administered 2018-04-28 (×2): 1 via TOPICAL

## 2018-04-28 MED ORDER — LACTATED RINGERS IV SOLN
INTRAVENOUS | Status: DC | PRN
  Administered 2018-04-28: 12:00:00 via INTRAVENOUS

## 2018-04-28 MED ORDER — OXYCODONE HCL 5 MG PO TABS
10.0000 mg | ORAL_TABLET | ORAL | Status: DC | PRN
  Administered 2018-04-28: 10 mg via ORAL
  Filled 2018-04-28: qty 2

## 2018-04-28 MED ORDER — FENTANYL CITRATE (PF) 100 MCG/2ML IJ SOLN
INTRAMUSCULAR | Status: DC | PRN
  Administered 2018-04-28: 100 ug via INTRAVENOUS
  Administered 2018-04-28: 50 ug via INTRAVENOUS
  Administered 2018-04-28: 150 ug via INTRAVENOUS
  Administered 2018-04-28: 100 ug via INTRAVENOUS
  Administered 2018-04-28: 25 ug via INTRAVENOUS
  Administered 2018-04-28: 50 ug via INTRAVENOUS

## 2018-04-28 MED ORDER — BUPIVACAINE-EPINEPHRINE 0.5% -1:200000 IJ SOLN
INTRAMUSCULAR | Status: AC
  Filled 2018-04-28: qty 1

## 2018-04-28 MED ORDER — ZOLPIDEM TARTRATE 5 MG PO TABS: 5.0000 mg | ORAL_TABLET | Freq: Every evening | ORAL | Status: DC | PRN

## 2018-04-28 MED ORDER — OXYCODONE HCL 5 MG PO TABS: 5.0000 mg | ORAL_TABLET | ORAL | Status: DC | PRN

## 2018-04-28 MED ORDER — ONDANSETRON HCL 4 MG/2ML IJ SOLN
INTRAMUSCULAR | Status: DC | PRN
  Administered 2018-04-28: 4 mg via INTRAVENOUS

## 2018-04-28 MED ORDER — LACTATED RINGERS IV SOLN: INTRAVENOUS | Status: DC

## 2018-04-28 MED ORDER — PROPOFOL 10 MG/ML IV BOLUS
INTRAVENOUS | Status: AC
  Filled 2018-04-28: qty 20

## 2018-04-28 MED ORDER — ONDANSETRON HCL 4 MG/2ML IJ SOLN: 4.0000 mg | Freq: Once | INTRAMUSCULAR | Status: DC | PRN

## 2018-04-28 MED ORDER — PHENOL 1.4 % MT LIQD: 1.0000 | OROMUCOSAL | Status: DC | PRN

## 2018-04-28 MED ORDER — CEFAZOLIN SODIUM-DEXTROSE 2-4 GM/100ML-% IV SOLN
INTRAVENOUS | Status: AC
  Filled 2018-04-28: qty 100

## 2018-04-28 MED ORDER — SODIUM CHLORIDE 0.9 % IV SOLN
INTRAVENOUS | Status: DC | PRN
  Administered 2018-04-28: 15:00:00

## 2018-04-28 SURGICAL SUPPLY — 107 items
BAG DECANTER FOR FLEXI CONT (MISCELLANEOUS) ×3 IMPLANT
BENZOIN TINCTURE PRP APPL 2/3 (GAUZE/BANDAGES/DRESSINGS) ×3 IMPLANT
BIT DRILL NEURO 2X3.1 SFT TUCH (MISCELLANEOUS) ×1 IMPLANT
BLADE CLIPPER SURG (BLADE) IMPLANT
BLADE CORE FAN STRYKER (BLADE) ×3 IMPLANT
BLADE STERNUM SYSTEM 6 (BLADE) ×3 IMPLANT
BLADE SURG 10 STRL SS (BLADE) ×3 IMPLANT
BLADE SURG 11 STRL SS (BLADE) IMPLANT
BLADE ULTRA TIP 2M (BLADE) IMPLANT
BUR MATCHSTICK NEURO 3.0 LAGG (BURR) ×3 IMPLANT
BUR PRECISION FLUTE 6.0 (BURR) IMPLANT
CAGE CORP CENTERPC T2 -A (Cage) ×3 IMPLANT
CANISTER SUCT 3000ML PPV (MISCELLANEOUS) ×3 IMPLANT
CARTRIDGE OIL MAESTRO DRILL (MISCELLANEOUS) ×1 IMPLANT
CLIP VESOCCLUDE MED 6/CT (CLIP) ×6 IMPLANT
CLIP VESOCCLUDE SM WIDE 6/CT (CLIP) ×3 IMPLANT
CLOSURE WOUND 1/2 X4 (GAUZE/BANDAGES/DRESSINGS) ×1
CLOSURE WOUND 1/4X4 (GAUZE/BANDAGES/DRESSINGS) ×1
COTTONBALL LRG STERILE PKG (GAUZE/BANDAGES/DRESSINGS) IMPLANT
COVER MAYO STAND STRL (DRAPES) ×3 IMPLANT
COVER WAND RF STERILE (DRAPES) ×3 IMPLANT
DIFFUSER DRILL AIR PNEUMATIC (MISCELLANEOUS) ×3 IMPLANT
DRAPE CAMERA VIDEO/LASER (DRAPES) IMPLANT
DRAPE CARDIOVASCULAR INCISE (DRAPES) ×2
DRAPE LAPAROTOMY 100X72 PEDS (DRAPES) IMPLANT
DRAPE LAPAROTOMY 100X72X124 (DRAPES) IMPLANT
DRAPE MICROSCOPE LEICA (MISCELLANEOUS) IMPLANT
DRAPE POUCH INSTRU U-SHP 10X18 (DRAPES) IMPLANT
DRAPE SRG 135X102X78XABS (DRAPES) ×1 IMPLANT
DRAPE SURG 17X23 STRL (DRAPES) ×12 IMPLANT
DRILL NEURO 2X3.1 SOFT TOUCH (MISCELLANEOUS) ×3
DRSG AQUACEL AG ADV 3.5X10 (GAUZE/BANDAGES/DRESSINGS) ×3 IMPLANT
ELECT REM PT RETURN 9FT ADLT (ELECTROSURGICAL) ×3
ELECTRODE REM PT RTRN 9FT ADLT (ELECTROSURGICAL) ×1 IMPLANT
EVACUATOR SILICONE 100CC (DRAIN) ×3 IMPLANT
GAUZE 4X4 16PLY RFD (DISPOSABLE) IMPLANT
GAUZE SPONGE 4X4 12PLY STRL (GAUZE/BANDAGES/DRESSINGS) ×3 IMPLANT
GLOVE BIO SURGEON STRL SZ 6.5 (GLOVE) ×2 IMPLANT
GLOVE BIO SURGEON STRL SZ7.5 (GLOVE) ×6 IMPLANT
GLOVE BIO SURGEON STRL SZ8 (GLOVE) ×6 IMPLANT
GLOVE BIO SURGEON STRL SZ8.5 (GLOVE) ×3 IMPLANT
GLOVE BIO SURGEONS STRL SZ 6.5 (GLOVE) ×1
GLOVE BIOGEL PI IND STRL 6.5 (GLOVE) ×2 IMPLANT
GLOVE BIOGEL PI IND STRL 7.5 (GLOVE) ×2 IMPLANT
GLOVE BIOGEL PI INDICATOR 6.5 (GLOVE) ×4
GLOVE BIOGEL PI INDICATOR 7.5 (GLOVE) ×4
GLOVE EXAM NITRILE LRG STRL (GLOVE) IMPLANT
GLOVE EXAM NITRILE XL STR (GLOVE) IMPLANT
GLOVE EXAM NITRILE XS STR PU (GLOVE) IMPLANT
GLOVE SURG SIGNA 7.5 PF LTX (GLOVE) ×3 IMPLANT
GLOVE SURG SS PI 6.0 STRL IVOR (GLOVE) ×6 IMPLANT
GLOVE SURG SS PI 7.0 STRL IVOR (GLOVE) ×6 IMPLANT
GOWN STRL REUS W/ TWL LRG LVL3 (GOWN DISPOSABLE) ×4 IMPLANT
GOWN STRL REUS W/ TWL XL LVL3 (GOWN DISPOSABLE) ×3 IMPLANT
GOWN STRL REUS W/TWL LRG LVL3 (GOWN DISPOSABLE) ×8
GOWN STRL REUS W/TWL XL LVL3 (GOWN DISPOSABLE) ×6
HEMOSTAT POWDER KIT SURGIFOAM (HEMOSTASIS) ×3 IMPLANT
KIT BASIN OR (CUSTOM PROCEDURE TRAY) ×3 IMPLANT
KIT TURNOVER KIT B (KITS) ×3 IMPLANT
LOOP VESSEL MAXI BLUE (MISCELLANEOUS) ×3 IMPLANT
NEEDLE HYPO 21X1.5 SAFETY (NEEDLE) IMPLANT
NEEDLE HYPO 22GX1.5 SAFETY (NEEDLE) ×3 IMPLANT
NEEDLE SPNL 18GX3.5 QUINCKE PK (NEEDLE) ×3 IMPLANT
NS IRRIG 1000ML POUR BTL (IV SOLUTION) ×3 IMPLANT
OIL CARTRIDGE MAESTRO DRILL (MISCELLANEOUS) ×3
PACK LAMINECTOMY NEURO (CUSTOM PROCEDURE TRAY) ×3 IMPLANT
PACK SURGICAL SETUP 50X90 (CUSTOM PROCEDURE TRAY) ×3 IMPLANT
PAD ARMBOARD 7.5X6 YLW CONV (MISCELLANEOUS) ×9 IMPLANT
PATTIES SURGICAL .25X.25 (GAUZE/BANDAGES/DRESSINGS) IMPLANT
PATTIES SURGICAL .5 X.5 (GAUZE/BANDAGES/DRESSINGS) IMPLANT
PATTIES SURGICAL .5 X3 (DISPOSABLE) IMPLANT
PATTIES SURGICAL 1/4 X 3 (GAUZE/BANDAGES/DRESSINGS) IMPLANT
PATTIES SURGICAL 1X1 (DISPOSABLE) IMPLANT
PIN DISTRACTION 14MM (PIN) ×6 IMPLANT
PLATE ANT CERV XTEND 1 LV 26 (Plate) ×3 IMPLANT
POWDER SURGICEL 3.0 GRAM (HEMOSTASIS) ×3 IMPLANT
RUBBERBAND STERILE (MISCELLANEOUS) IMPLANT
SCREW XTD VAR 4.2 SELF TAP 18 (Screw) ×12 IMPLANT
SPONGE INTESTINAL PEANUT (DISPOSABLE) ×3 IMPLANT
SPONGE LAP 18X18 RF (DISPOSABLE) ×3 IMPLANT
SPONGE LAP 4X18 RFD (DISPOSABLE) IMPLANT
SPONGE NEURO XRAY DETECT 1X3 (DISPOSABLE) IMPLANT
SPONGE SURGIFOAM ABS GEL 100 (HEMOSTASIS) ×3 IMPLANT
STAPLER SKIN PROX WIDE 3.9 (STAPLE) IMPLANT
STRIP CLOSURE SKIN 1/2X4 (GAUZE/BANDAGES/DRESSINGS) ×2 IMPLANT
STRIP CLOSURE SKIN 1/4X4 (GAUZE/BANDAGES/DRESSINGS) ×2 IMPLANT
SURGIFLO W/THROMBIN 8M KIT (HEMOSTASIS) ×3 IMPLANT
SUT ETHILON 3 0 FSL (SUTURE) IMPLANT
SUT NURALON 4 0 TR CR/8 (SUTURE) IMPLANT
SUT PROLENE 6 0 BV (SUTURE) IMPLANT
SUT SILK  1 MH (SUTURE) ×2
SUT SILK 1 MH (SUTURE) ×1 IMPLANT
SUT SILK 3 0 TIES 17X18 (SUTURE)
SUT SILK 3-0 18XBRD TIE BLK (SUTURE) IMPLANT
SUT VIC AB 1 CT1 18XBRD ANBCTR (SUTURE) IMPLANT
SUT VIC AB 1 CT1 8-18 (SUTURE)
SUT VIC AB 1 CTX 18 (SUTURE) ×6 IMPLANT
SUT VIC AB 1 CTX 27 (SUTURE) ×3 IMPLANT
SUT VIC AB 2-0 CP2 18 (SUTURE) IMPLANT
SUT VIC AB 2-0 CTX 36 (SUTURE) ×6 IMPLANT
SUT VIC AB 3-0 X1 27 (SUTURE) ×6 IMPLANT
TAPE CLOTH SURG 4X10 WHT LF (GAUZE/BANDAGES/DRESSINGS) ×3 IMPLANT
TOWEL GREEN STERILE (TOWEL DISPOSABLE) ×3 IMPLANT
TOWEL GREEN STERILE FF (TOWEL DISPOSABLE) ×6 IMPLANT
TRAY FOLEY MTR SLVR 16FR STAT (SET/KITS/TRAYS/PACK) ×3 IMPLANT
WATER STERILE IRR 1000ML POUR (IV SOLUTION) ×3 IMPLANT
YANKAUER SUCT BULB TIP NO VENT (SUCTIONS) ×3 IMPLANT

## 2018-04-28 NOTE — Transfer of Care (Signed)
Immediate Anesthesia Transfer of Care Note  Patient: Dawn Haynes  Procedure(s) Performed: THORACIC three corpectomy, excision of tumor, fusion and plating (N/A Chest) STERNOTOMY (N/A Chest)  Patient Location: PACU  Anesthesia Type:General  Level of Consciousness: drowsy  Airway & Oxygen Therapy: Patient Spontanous Breathing and Patient connected to nasal cannula oxygen  Post-op Assessment: Report given to RN and Post -op Vital signs reviewed and stable  Post vital signs: Reviewed and stable  Last Vitals:  Vitals Value Taken Time  BP 114/74 04/28/2018  6:20 PM  Temp    Pulse 75 04/28/2018  6:20 PM  Resp 13 04/28/2018  6:20 PM  SpO2 100 % 04/28/2018  6:20 PM  Vitals shown include unvalidated device data.  Last Pain:  Vitals:   04/28/18 0744  TempSrc:   PainSc: 4          Complications: No apparent anesthesia complications

## 2018-04-28 NOTE — Anesthesia Procedure Notes (Signed)
Central Venous Catheter Insertion Performed by: Roberts Gaudy, MD, anesthesiologist Start/End10/17/2019 11:35 AM, 04/28/2018 11:45 AM Patient location: Pre-op. Preanesthetic checklist: patient identified, IV checked, site marked, risks and benefits discussed, surgical consent, monitors and equipment checked, pre-op evaluation, timeout performed and anesthesia consent Position: Trendelenburg Lidocaine 1% used for infiltration and patient sedated Hand hygiene performed , maximum sterile barriers used  and Seldinger technique used Catheter size: 8 Fr Total catheter length 16. Central line was placed.Double lumen Procedure performed using ultrasound guided technique. Ultrasound Notes:anatomy identified, needle tip was noted to be adjacent to the nerve/plexus identified, no ultrasound evidence of intravascular and/or intraneural injection and image(s) printed for medical record Attempts: 1 Following insertion, dressing applied, line sutured and Biopatch. Post procedure assessment: blood return through all ports  Patient tolerated the procedure well with no immediate complications.

## 2018-04-28 NOTE — Anesthesia Procedure Notes (Signed)
Arterial Line Insertion Start/End10/17/2019 10:25 AM, 04/28/2018 10:40 AM Performed by: Jenne Campus, CRNA, CRNA  Patient location: Pre-op. Preanesthetic checklist: patient identified, IV checked, site marked, risks and benefits discussed, surgical consent, monitors and equipment checked, pre-op evaluation, timeout performed and anesthesia consent Lidocaine 1% used for infiltration Right, radial was placed Catheter size: 20 G Hand hygiene performed  and maximum sterile barriers used   Attempts: 1 Procedure performed without using ultrasound guided technique. Following insertion, dressing applied and Biopatch. Post procedure assessment: normal  Patient tolerated the procedure well with no immediate complications.

## 2018-04-28 NOTE — ED Notes (Signed)
Pt placed in a hospital bed, blankets given.

## 2018-04-28 NOTE — Progress Notes (Signed)
Subjective: The patient is alert and pleasant.  Her father is at the bedside.  Objective: Vital signs in last 24 hours: Temp:  [98.7 F (37.1 C)-99.1 F (37.3 C)] 99.1 F (37.3 C) (10/16 1941) Pulse Rate:  [68-103] 80 (10/17 0700) Resp:  [14-18] 14 (10/17 0700) BP: (93-124)/(66-82) 109/75 (10/17 0700) SpO2:  [99 %-100 %] 100 % (10/17 0700) Weight:  [54.6 kg] 54.6 kg (10/16 1600) Estimated body mass index is 21.31 kg/m as calculated from the following:   Height as of an earlier encounter on 04/27/18: 5\' 3"  (1.6 m).   Weight as of an earlier encounter on 04/27/18: 54.6 kg.   Intake/Output from previous day: No intake/output data recorded. Intake/Output this shift: No intake/output data recorded.  Physical exam the patient is alert and oriented x3.  Her strength is grossly normal in her upper extremities and bilateral gastrocnemius and dorsiflexors.  She continues to have a T3 sensory level.  Lab Results: Recent Labs    04/27/18 2022  WBC 7.8  HGB 12.1  HCT 38.7  PLT 325   BMET Recent Labs    04/27/18 2022  NA 140  K 4.3  CL 101  CO2 26  GLUCOSE 105*  BUN 12  CREATININE 0.88  CALCIUM 10.0    Studies/Results: Dg Cervical Spine 1 View  Result Date: 04/28/2018 CLINICAL DATA:  37 y/o  F; cervicalgia. History of breast cancer. EXAM: DG CERVICAL SPINE - 1 VIEW COMPARISON:  08/20/2016 PET-CT. FINDINGS: There is no evidence of cervical spine fracture or prevertebral soft tissue swelling. Alignment is normal. T3-T4 metastasis and compression deformity is obscured by overlying soft tissues. IMPRESSION: Negative cervical spine. T3-T4 metastasis and compression deformity is obscured by overlying soft tissues. Electronically Signed   By: Kristine Garbe M.D.   On: 04/28/2018 05:09   Mr Thoracic Spine W Wo Contrast  Result Date: 04/27/2018 CLINICAL DATA:  Malignant neoplasm. Sensory level, evaluate for cord compression. EXAM: MRI THORACIC WITHOUT AND WITH CONTRAST  TECHNIQUE: Multiplanar and multiecho pulse sequences of the thoracic spine were obtained without and with intravenous contrast. CONTRAST:  5 cc Gadavist intravenous COMPARISON:  None. FINDINGS: MRI THORACIC SPINE FINDINGS Alignment:  Kyphotic deformity due to T3 compression fracture. Vertebrae: There is infiltrative tumor appearance within the T3 and T4 bodies and posterior elements with extraosseous tumor infiltrating the left more than right T3-4 and T4-5 foramina. There is circumferential epidural tumor at the level of T3 with dorsal epidural tumor extending to the level T1 to T8 (based on postcontrast sagittal images). Cord: Cord compression at T3 due to compression fracture with posterior bowing of the cortex and due to epidural tumor. No visible cord edema. Paraspinal and other soft tissues: Left paravertebral tumor extending into the left third and fourth ribs. Known metastatic disease to the liver. Disc levels: No degenerative changes. Critical Value/emergent results were called by telephone at the time of interpretation on 04/27/2018 at 6:33 pm to Dr. Nicholas Lose , who verbally acknowledged these results. Patient is being transferred to the ER, with who is been notified. 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 Metastatic deposit diffusely infiltrates the T3 and T4 vertebrae with pathologic fracture of the T3 vertebra contributing to cord compression. Extensive epidural tumor spread seen dorsally from T1-T8 (see postcontrast sagittal imaging). T3-4 and T4-5 foraminal compromise by tumor. There is asymmetric left paravertebral extension which infiltrates the left third and fourth ribs. Electronically Signed   By: Monte Fantasia M.D.   On: 04/27/2018 18:41  Assessment/Plan: T3 pathologic fracture, paraparesis, thoracic myelopathy, thoracic stenosis, etc.: I have discussed the situation with the patient and her father.  We have discussed the various treatment options.  He has both anterior and  posterior tumor.  Most of her neurocompression appears to be from the pathologic fracture anteriorly at T3.  We have discussed the various treatment options.  I have recommended an anterior T3, and possibly T4 corpectomy with anterior instrumentation and fusion.  I have described that surgery to them.  I have told him that this would likely require a loading of the manubrium/sternotomy for exposure, done by the heart surgeons.  We have discussed the risks of surgery including risk of anesthesia, hemorrhage, infection, vocal cord paralysis, injury to the anterior neck structures, spinal cord injury, paralysis, instrumentation malplacement or malfunction, fusion failure, etc.  I have also told her that she may need posterior surgery in the future depending how she spines to radiation and heels.  I have answered all the questions.  She has decided to proceed with surgery.  LOS: 1 day     Dawn Haynes 04/28/2018, 9:58 AM

## 2018-04-28 NOTE — Anesthesia Procedure Notes (Signed)
Procedure Name: Intubation Date/Time: 04/28/2018 2:26 PM Performed by: Jenne Campus, CRNA Pre-anesthesia Checklist: Patient identified, Emergency Drugs available, Suction available and Patient being monitored Patient Re-evaluated:Patient Re-evaluated prior to induction Oxygen Delivery Method: Circle System Utilized Preoxygenation: Pre-oxygenation with 100% oxygen Induction Type: IV induction Ventilation: Mask ventilation without difficulty Laryngoscope Size: Miller and 3 Grade View: Grade I Tube type: Oral Tube size: 7.5 mm Number of attempts: 1 Airway Equipment and Method: Stylet and Oral airway Placement Confirmation: ETT inserted through vocal cords under direct vision,  positive ETCO2 and breath sounds checked- equal and bilateral Secured at: 21 cm Tube secured with: Tape Dental Injury: Teeth and Oropharynx as per pre-operative assessment

## 2018-04-28 NOTE — Op Note (Signed)
Brief history: The patient is a 37 year old black female with a history of stage IV metastatic breast cancer.  She began having back pain in July.  Over the last week she has developed worsening pain with numbness tingling and weakness in her lower extremities and thorax.  She was worked up with a thoracic MRI which demonstrated a T3 pathologic fracture with ventral spinal cord compression as well as posterior epidural tumor from T1-T8.  I discussed the various treatment options with the patient and recommend she undergo a sternotomy for anterior resection of the tumor, decompression of her spinal cord and instrumentation/fusion.  The patient has weighed the risks, benefits and alternatives of surgery and decided proceed with the operation.  Preop diagnosis: T3 pathologic fracture, thoracic metastasis, thoracic epidural tumor, thoracic myelopathy, paraparesis  Postop diagnosis: The same  Procedure: Sternotomy performed by Dr. Darcey Nora; T3 anterior corpectomy, insertion of Medtronic titanium expandable interbody prosthesis at the T3 corpectomy site; T2-3 and T3-4 anterior arthrodesis with local bone; anterior T2-T4 instrumentation plate/screws (globus)  Surgeons: Dr. [name] and Dr. Darcey Nora  Anesthesia: General endotracheal  Estimated blood loss: 150 cc  Specimens: Tumor  Drains: None placed by me  Complications: None  Description of procedure: The patient was brought to the operating room by the anesthesia team.  General endotracheal anesthesia was induced.  She remained in the supine position.  A roll was placed on her shoulders.  Her neck was kept in neutral position.  The patient's anterior neck and thorax was then prepared with Betadine scrub and Betadine solution.  Sterile drapes were applied.  Dr. Darcey Nora performed the sternotomy.  For further details of that part of the operation please refer to his operative note.  I then injected the areas to be incised with Marcaine with  epinephrine solution.  I connected the patient's sternotomy incision to an incision I made in the patient's left anterior neck.  I dissected with the Metzenbaum scissors.  I divided the platysma muscle.  I dissected medial to the sternocleidomastoid muscle jugular vein and carotid artery.  I carefully dissected down towards the anterior cervical spine identifying the esophagus and retracting it medially.  I then used Kitner swabs to clear the soft tissue from the anterior cervical spine.  We inserted a bent spinal needle into the upper exposed intervertebral disc space.  We obtained an anterior operative radiograph to confirm our location.  I then counted down to the T2-3 interspace.  The T3 tumor was obvious.  I then used electrocautery to detach the medial border of the longus colli muscle bilaterally from the T 2-3 and T3-4 interspaces.  I inserted the Caspar retractor for exposure.  I then incised the intervertebral disc at T2-3 and T3-4 with a scalpel.  Removing the bone containing tumor with a combination of the drill, the pituitary forceps and the ossified tool.  We sent off biopsies to pathology.  I inserted distraction pins into the vertebral bodies at T2 and T4 and distracted the corpectomy site.  We removed the T2-3 and T3-4 intervertebral disc.  I used a high-speed drill to complete the T3 corpectomy.  I incised the posterior longitudinal ligament and remove that with the Kerrison punches undercutting vertebral endplates at A2-5 and K5-3 decompressing the thecal sac.  This completed the decompression.  We preferred to the vertebral endplates at T2 and T4 for the fusion in the curettes to clear the soft tissues.  Then inserted a Medtronic titanium expandable interbody prosthesis into the corpectomy site  and expanded the prosthesis.  There was a good snug fit.  We placed some local bone that appeared free of tumor, we obtained during the drilling around the prosthesis.  We locked it into place.  We  now turned our attention to the intraspinal resuscitation.  I laid a anterior plate from C3-U1.  We drilled two18 mm holes at T2 and T4.  We secured the plate to the vertebral bodies by placing a 18 mm of tapping screw at T2 and T4.  We got good bony purchase.  We obtained an intraoperative radiograph but there was limited visualization.  The construct looked good in vivo.  We therefore secured the screws the plate by locking each cam.  This completed the instrumentation.  I obtained hemostasis using bipolar cautery.  I irrigated the wound out with bacitracin solution.  At this point Dr. Roxan Hockey took over the closure of the sternotomy.

## 2018-04-28 NOTE — ED Notes (Signed)
Patient Dawn Haynes BhCG result slip states result = <5.0 IU/L. Unable to get result to slave over into Epic at this time. Report called to Ria Comment, RN [facility] Short Stay

## 2018-04-28 NOTE — Progress Notes (Signed)
Subjective: The patient is somnolent but arousable.  She is in no apparent distress.  Objective: Vital signs in last 24 hours: Temp:  [97.8 F (36.6 C)-99.1 F (37.3 C)] 97.8 F (36.6 C) (10/17 1825) Pulse Rate:  [62-97] 71 (10/17 1820) Resp:  [12-23] 13 (10/17 1820) BP: (93-121)/(64-82) 114/74 (10/17 1820) SpO2:  [99 %-100 %] 100 % (10/17 1820) Estimated body mass index is 21.31 kg/m as calculated from the following:   Height as of an earlier encounter on 04/27/18: 5\' 3"  (1.6 m).   Weight as of an earlier encounter on 04/27/18: 54.6 kg.   Intake/Output from previous day: No intake/output data recorded. Intake/Output this shift: Total I/O In: 2300 [I.V.:2300] Out: 700 [Urine:400; Blood:300]  Physical exam the patient is somnolent but arousable.  She is in no apparent distress.  She is moving her lower extremities well.  Lab Results: Recent Labs    04/27/18 2022  WBC 7.8  HGB 12.1  HCT 38.7  PLT 325   BMET Recent Labs    04/27/18 2022  NA 140  K 4.3  CL 101  CO2 26  GLUCOSE 105*  BUN 12  CREATININE 0.88  CALCIUM 10.0    Studies/Results: Dg Chest 1 View  Result Date: 04/28/2018 CLINICAL DATA:  T3 corpectomy.  Instrument count. EXAM: CHEST  1 VIEW COMPARISON:  Body CT 07/23/2017 FINDINGS: Endotracheal tube is partially visualized. Esophageal probe appears coiled on itself in the upper thorax. Partially visualized right internal jugular approach central venous catheter. Sternotomy wires are in place. Orthopedic hardware at the level of T3 seen. No radiopaque foreign bodies seen. Cardiomediastinal silhouette is normal. Mediastinal contours appear intact. There is no evidence of focal airspace consolidation, pleural effusion or pneumothorax. Osseous structures are without acute abnormality. Soft tissues are grossly normal. IMPRESSION: No radiopaque foreign bodies seen. The tip of the endotracheal tube is obscured by orthopedic hardware at the level of T3.  Electronically Signed   By: Fidela Salisbury M.D.   On: 04/28/2018 18:08   Dg Cervical Spine 1 View  Result Date: 04/28/2018 CLINICAL DATA:  37 y/o  F; cervicalgia. History of breast cancer. EXAM: DG CERVICAL SPINE - 1 VIEW COMPARISON:  08/20/2016 PET-CT. FINDINGS: There is no evidence of cervical spine fracture or prevertebral soft tissue swelling. Alignment is normal. T3-T4 metastasis and compression deformity is obscured by overlying soft tissues. IMPRESSION: Negative cervical spine. T3-T4 metastasis and compression deformity is obscured by overlying soft tissues. Electronically Signed   By: Kristine Garbe M.D.   On: 04/28/2018 05:09   Mr Thoracic Spine W Wo Contrast  Result Date: 04/27/2018 CLINICAL DATA:  Malignant neoplasm. Sensory level, evaluate for cord compression. EXAM: MRI THORACIC WITHOUT AND WITH CONTRAST TECHNIQUE: Multiplanar and multiecho pulse sequences of the thoracic spine were obtained without and with intravenous contrast. CONTRAST:  5 cc Gadavist intravenous COMPARISON:  None. FINDINGS: MRI THORACIC SPINE FINDINGS Alignment:  Kyphotic deformity due to T3 compression fracture. Vertebrae: There is infiltrative tumor appearance within the T3 and T4 bodies and posterior elements with extraosseous tumor infiltrating the left more than right T3-4 and T4-5 foramina. There is circumferential epidural tumor at the level of T3 with dorsal epidural tumor extending to the level T1 to T8 (based on postcontrast sagittal images). Cord: Cord compression at T3 due to compression fracture with posterior bowing of the cortex and due to epidural tumor. No visible cord edema. Paraspinal and other soft tissues: Left paravertebral tumor extending into the left third and fourth ribs.  Known metastatic disease to the liver. Disc levels: No degenerative changes. Critical Value/emergent results were called by telephone at the time of interpretation on 04/27/2018 at 6:33 pm to Dr. Nicholas Lose , who  verbally acknowledged these results. Patient is being transferred to the ER, with who is been notified. 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 Metastatic deposit diffusely infiltrates the T3 and T4 vertebrae with pathologic fracture of the T3 vertebra contributing to cord compression. Extensive epidural tumor spread seen dorsally from T1-T8 (see postcontrast sagittal imaging). T3-4 and T4-5 foraminal compromise by tumor. There is asymmetric left paravertebral extension which infiltrates the left third and fourth ribs. Electronically Signed   By: Monte Fantasia M.D.   On: 04/27/2018 18:41    Assessment/Plan: The patient is doing well.  I spoke with her family.  LOS: 1 day     Ophelia Charter 04/28/2018, 6:44 PM

## 2018-04-28 NOTE — ED Notes (Signed)
Patient leaving [place] with [facility] for transport to [facility]

## 2018-04-28 NOTE — Anesthesia Preprocedure Evaluation (Addendum)
Anesthesia Evaluation  Patient identified by MRN, date of birth, ID band Patient awake    Reviewed: Allergy & Precautions, NPO status , Patient's Chart, lab work & pertinent test results  Airway Mallampati: II  TM Distance: >3 FB Neck ROM: Full    Dental  (+) Teeth Intact, Dental Advisory Given   Pulmonary neg pulmonary ROS,    breath sounds clear to auscultation       Cardiovascular negative cardio ROS   Rhythm:Regular Rate:Normal     Neuro/Psych Lower extremity weakness.    GI/Hepatic negative GI ROS,   Endo/Other    Renal/GU      Musculoskeletal   Abdominal   Peds  Hematology   Anesthesia Other Findings Left breast cancer 2016. Mastectomy with chemo/radiation.  Reproductive/Obstetrics                            Anesthesia Physical Anesthesia Plan  ASA: III  Anesthesia Plan: General   Post-op Pain Management:    Induction: Intravenous  PONV Risk Score and Plan: Ondansetron and Dexamethasone  Airway Management Planned: Oral ETT  Additional Equipment: Arterial line and CVP  Intra-op Plan:   Post-operative Plan: Possible Post-op intubation/ventilation  Informed Consent: I have reviewed the patients History and Physical, chart, labs and discussed the procedure including the risks, benefits and alternatives for the proposed anesthesia with the patient or authorized representative who has indicated his/her understanding and acceptance.   Dental advisory given  Plan Discussed with: CRNA and Anesthesiologist  Anesthesia Plan Comments:         Anesthesia Quick Evaluation

## 2018-04-29 DIAGNOSIS — M8448XS Pathological fracture, other site, sequela: Secondary | ICD-10-CM

## 2018-04-29 LAB — TYPE AND SCREEN
ABO/RH(D): A POS
Antibody Screen: NEGATIVE

## 2018-04-29 MED FILL — Thrombin (Recombinant) For Soln 20000 Unit: CUTANEOUS | Qty: 1 | Status: AC

## 2018-04-29 NOTE — Anesthesia Postprocedure Evaluation (Signed)
Anesthesia Post Note  Patient: Dawn Haynes  Procedure(s) Performed: THORACIC three corpectomy, excision of tumor, fusion and plating (N/A Chest) STERNOTOMY (N/A Chest)     Patient location during evaluation: PACU Anesthesia Type: General Level of consciousness: awake and alert Pain management: pain level controlled Vital Signs Assessment: post-procedure vital signs reviewed and stable Respiratory status: spontaneous breathing, nonlabored ventilation, respiratory function stable and patient connected to nasal cannula oxygen Cardiovascular status: blood pressure returned to baseline and stable Postop Assessment: no apparent nausea or vomiting Anesthetic complications: no    Last Vitals:  Vitals:   04/29/18 0500 04/29/18 0600  BP: 94/68 92/69  Pulse: 65 68  Resp: 18 19  Temp:    SpO2: 100% 100%    Last Pain:  Vitals:   04/29/18 0400  TempSrc: Oral  PainSc:    Pain Goal:                 [name]

## 2018-04-29 NOTE — Progress Notes (Signed)
Subjective: The patient is alert and pleasant.  She looks well.  Objective: Vital signs in last 24 hours: Temp:  [97.6 F (36.4 C)-97.8 F (36.6 C)] 97.7 F (36.5 C) (10/18 0400) Pulse Rate:  [55-88] 72 (10/18 0700) Resp:  [8-26] 23 (10/18 0700) BP: (90-119)/(60-99) 102/67 (10/18 0700) SpO2:  [96 %-100 %] 98 % (10/18 0700) Arterial Line BP: (76-142)/(54-91) 119/63 (10/18 0700) Estimated body mass index is 21.31 kg/m as calculated from the following:   Height as of an earlier encounter on 04/27/18: 5\' 3"  (1.6 m).   Weight as of an earlier encounter on 04/27/18: 54.6 kg.   Intake/Output from previous day: 10/17 0701 - 10/18 0700 In: 3368.8 [I.V.:3168.8; IV Piggyback:200] Out: 2100 [Urine:1700; Drains:100; Blood:300] Intake/Output this shift: No intake/output data recorded.  Physical exam the patient is alert and pleasant.  Her lower extremity strength is normal.  Her numbness has largely resolved.  Her dressing is clean and dry.  Lab Results: Recent Labs    04/27/18 2022  WBC 7.8  HGB 12.1  HCT 38.7  PLT 325   BMET Recent Labs    04/27/18 2022  NA 140  K 4.3  CL 101  CO2 26  GLUCOSE 105*  BUN 12  CREATININE 0.88  CALCIUM 10.0    Studies/Results: Dg Chest 1 View  Result Date: 04/28/2018 CLINICAL DATA:  T3 corpectomy.  Instrument count. EXAM: CHEST  1 VIEW COMPARISON:  Body CT 07/23/2017 FINDINGS: Endotracheal tube is partially visualized. Esophageal probe appears coiled on itself in the upper thorax. Partially visualized right internal jugular approach central venous catheter. Sternotomy wires are in place. Orthopedic hardware at the level of T3 seen. No radiopaque foreign bodies seen. Cardiomediastinal silhouette is normal. Mediastinal contours appear intact. There is no evidence of focal airspace consolidation, pleural effusion or pneumothorax. Osseous structures are without acute abnormality. Soft tissues are grossly normal. IMPRESSION: No radiopaque foreign  bodies seen. The tip of the endotracheal tube is obscured by orthopedic hardware at the level of T3. Electronically Signed   By: Fidela Salisbury M.D.   On: 04/28/2018 18:08   Dg Cervical Spine 1 View  Result Date: 04/28/2018 CLINICAL DATA:  37 y/o  F; cervicalgia. History of breast cancer. EXAM: DG CERVICAL SPINE - 1 VIEW COMPARISON:  08/20/2016 PET-CT. FINDINGS: There is no evidence of cervical spine fracture or prevertebral soft tissue swelling. Alignment is normal. T3-T4 metastasis and compression deformity is obscured by overlying soft tissues. IMPRESSION: Negative cervical spine. T3-T4 metastasis and compression deformity is obscured by overlying soft tissues. Electronically Signed   By: Kristine Garbe M.D.   On: 04/28/2018 05:09   Dg Thoracic Spine 2 View  Result Date: 04/28/2018 CLINICAL DATA:  Localization for T3 corpectomy. EXAM: THORACIC SPINE 2 VIEWS COMPARISON:  MRI yesterday. FINDINGS: Exact counting is not possible on these images. Initial film show localization in the upper thoracic region. Final film shows corpectomy with anterior plate and screws. Exact levels are indeterminate. IMPRESSION: Upper thoracic corpectomy and fusion. Electronically Signed   By: Nelson Chimes M.D.   On: 04/28/2018 19:09   Mr Thoracic Spine W Wo Contrast  Result Date: 04/27/2018 CLINICAL DATA:  Malignant neoplasm. Sensory level, evaluate for cord compression. EXAM: MRI THORACIC WITHOUT AND WITH CONTRAST TECHNIQUE: Multiplanar and multiecho pulse sequences of the thoracic spine were obtained without and with intravenous contrast. CONTRAST:  5 cc Gadavist intravenous COMPARISON:  None. FINDINGS: MRI THORACIC SPINE FINDINGS Alignment:  Kyphotic deformity due to T3 compression fracture. Vertebrae:  There is infiltrative tumor appearance within the T3 and T4 bodies and posterior elements with extraosseous tumor infiltrating the left more than right T3-4 and T4-5 foramina. There is circumferential  epidural tumor at the level of T3 with dorsal epidural tumor extending to the level T1 to T8 (based on postcontrast sagittal images). Cord: Cord compression at T3 due to compression fracture with posterior bowing of the cortex and due to epidural tumor. No visible cord edema. Paraspinal and other soft tissues: Left paravertebral tumor extending into the left third and fourth ribs. Known metastatic disease to the liver. Disc levels: No degenerative changes. Critical Value/emergent results were called by telephone at the time of interpretation on 04/27/2018 at 6:33 pm to Dr. Nicholas Lose , who verbally acknowledged these results. Patient is being transferred to the ER, with who is been notified. 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 Metastatic deposit diffusely infiltrates the T3 and T4 vertebrae with pathologic fracture of the T3 vertebra contributing to cord compression. Extensive epidural tumor spread seen dorsally from T1-T8 (see postcontrast sagittal imaging). T3-4 and T4-5 foraminal compromise by tumor. There is asymmetric left paravertebral extension which infiltrates the left third and fourth ribs. Electronically Signed   By: Monte Fantasia M.D.   On: 04/27/2018 18:41    Assessment/Plan: Postop day #1: The patient is doing great.  She may go home this weekend.  She will need to follow-up with her oncologist and radiation oncologist for further treatments for her metastatic breast cancer.  LOS: 2 days     Ophelia Charter 04/29/2018, 7:59 AM

## 2018-04-29 NOTE — Progress Notes (Signed)
Inpatient Rehabilitation Admissions Coordinator  Pt not in need of an inpt rehab admit at this time. Recommend home with [facility] at this time.We will sign off.  Danne Baxter, RN, MSN Rehab Admissions Coordinator [phone] 04/29/2018 7:08 PM

## 2018-04-29 NOTE — Progress Notes (Signed)
      [address]       [place] [address]             [phone]      In good spirits, denies pain  BP 96/71 (BP Location: Right Arm)   Pulse 68   Temp (!) 97.5 F (36.4 C) (Oral)   Resp (!) 22   LMP 04/07/2018   SpO2 98%   Intake/Output Summary (Last 24 hours) at 04/29/2018 0902 Last data filed at 04/29/2018 0800 Gross per 24 hour  Intake 3538.84 ml  Output 2500 ml  Net 1038.84 ml   Dressing clean and dry Minimal drainage from JP- will dc  Remo Lipps C. Roxan Hockey, MD [facility] [phone]

## 2018-04-29 NOTE — Progress Notes (Addendum)
Inpatient Rehabilitation Admissions Coordinator  I await therapy evaluations to assist with planning dispo options. I met with pt at bedside and discussed [facility] vs brief inpt rehab admit pending her progress. I will follow up on Monday if pt remains inpt.  Danne Baxter, RN, MSN Rehab Admissions Coordinator [phone] 04/29/2018 12:52 PM

## 2018-04-29 NOTE — Op Note (Signed)
NAMEREI, CONTEE MEDICAL RECORD [id number] ACCOUNT [id number] DATE OF BIRTH:[birth year] FACILITY: [facility] LOCATION: [facility] PHYSICIAN:Daryan Cagley VAN TRIGT III, MD  OPERATIVE REPORT  DATE OF PROCEDURE:  04/28/2018  OPERATION:  Partial sternotomy for exposure of the thoracic spine.  SURGEON:  Ivin Poot, MD  ANESTHESIA:  General.  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  POSTOPERATIVE DIAGNOSIS:  Metastatic tumor involving the thoracic spine requiring neurosurgical therapy.  CLINICAL NOTE  PROCEDURE:  I was asked by Dr. Arnoldo Morale to assist with exposure for this very nice young woman with metastatic breast cancer and worsening paraplegia.  I examined the patient in the preop holding area, discussed the procedure of partial  sternotomy with the patient and her family, and discussed the potential recovery including the use of a mediastinal drain and sternal healing issues.  She agreed to proceed with this operation under what I felt was an informed consent.  DESCRIPTION OF PROCEDURE:  The patient was brought directly from preop holding to the operating room and placed supine on the OR table and general anesthesia was induced.  She remained stable.  The patient was prepped and draped as a sterile field.  A  proper time-out was performed.  A short incision was made in the upper sternum through the manubrium into the mid body of the sternum.  The sternum was gently spread with a laminar spreader and hemostasis was achieved.  The innominate vein and the  innominate artery were dissected from the mediastinum and encircled with vessel loops to be retracted laterally to the right side for exposure of the spine.  The spinal procedure will be dictated separately by Dr. Arnoldo Morale.    Estimated blood loss was minimal, less than 50 mL.  AN/NUANCE  D:04/29/2018 T:04/29/2018 JOB:[id number]

## 2018-04-29 NOTE — Consult Note (Signed)
Physical Medicine and Rehabilitation Consult Reason for Consult: Decreased functional mobility Referring Physician: Dr. Arnoldo Morale   HPI: Dawn Haynes is a 37 y.o. right-handed female with history of stage IV metastatic breast cancer followed by Dr. [name].  She has been treated with lumpectomy and mastectomy as well as chemo and radiation.  Per chart review patient lives with 3 young children.  Reportedly independent up until July 2019 with steady decline.  Multiple family in the area with good support.  2 level home with bedroom upstairs.  Presented 04/27/2018 with upper thoracic pain since July 2019 with progressive pain and numbness tingling weakness in her thorax and lower extremities.  Recent thoracic MRI demonstrated a T3 pathologic fracture with epidural tumor.  Underwent sternotomy performed by Dr. Nils Pyle, T3 anterior corpectomy insertion of Medtronic titanium expandable interbody prosthesis at the T3 topectomy site, T2-3 and T3-4 anterior arthrodesis with anterior T2-T4 instrumentation plate and screws 02/77/4128 per Dr. Arnoldo Morale.  Hospital course pain management.  Decadron protocol as indicated.  No brace needed.  Therapy evaluations pending.  MD has requested physical medicine rehab consult.   Review of Systems  Constitutional: Negative for chills and fever.  HENT: Negative for hearing loss.   Eyes: Negative for blurred vision and double vision.  Respiratory: Negative for cough and shortness of breath.   Cardiovascular: Positive for leg swelling. Negative for chest pain and palpitations.  Gastrointestinal: Positive for constipation. Negative for nausea and vomiting.  Genitourinary: Negative for dysuria, flank pain and hematuria.  Musculoskeletal: Positive for back pain and myalgias.  Skin: Negative for rash.  Neurological: Positive for tingling.  All other systems reviewed and are negative.  Past Medical History:  Diagnosis Date  . Back ache   . Breast cancer ([place])  07/09/2015   left breast  . Breast cancer of upper-outer quadrant of left female breast ([place]) 07/11/2015  . DDD (degenerative disc disease), lumbar    L1-2  . Genetic testing 01/12/2017   Ms. Benecke underwent genetic counseling and testing for hereditary cancer syndromes on 01/05/2017. Her results were negative for mutations in all 46 genes analyzed by [employer]'s 46-gene Common Hereditary Cancers Panel. Genes analyzed include: APC, ATM, AXIN2, BARD1, BMPR1A, BRCA1, BRCA2, BRIP1, CDH1, CDKN2A, CHEK2, CTNNA1, DICER1, EPCAM, GREM1, HOXB13, KIT, MEN1, MLH1, MSH2, MSH3, MSH6, MUTYH, NBN,   . Genetic testing of female    [employer] panel negative 01/2017  . History of blood transfusion    "when I lost alot of blood when I went into premature labor"; no abnormal reaction noted  . Weakness    numbness and tingling in both hands   Past Surgical History:  Procedure Laterality Date  . BREAST BIOPSY Left 2018  . BREAST LUMPECTOMY WITH RADIOACTIVE SEED AND SENTINEL LYMPH NODE BIOPSY Left 10/28/2016   Procedure: LEFT BREAST LUMPECTOMY WITH BRACKETED RADIOACTIVE SEED AND SENTINEL LYMPH NODE BIOPSY;  Surgeon: [name], MD;  Location: [facility];  Service: General;  Laterality: Left;  . BREAST RECONSTRUCTION WITH PLACEMENT OF TISSUE EXPANDER AND FLEX HD (ACELLULAR HYDRATED DERMIS) Left 12/23/2016  . BREAST RECONSTRUCTION WITH PLACEMENT OF TISSUE EXPANDER AND FLEX HD (ACELLULAR HYDRATED DERMIS) Left 12/23/2016   Procedure: IMMEDIATE LEFT BREAST RECONSTRUCTION WITH PLACEMENT OF TISSUE EXPANDER AND FLEX HD (ACELLULAR HYDRATED DERMIS);  Surgeon: Wallace Going, DO;  Location: [facility];  Service: Plastics;  Laterality: Left;  . CESAREAN SECTION N/A 01/22/2013   Procedure: primary CESAREAN SECTION of baby boy at 2001;  Surgeon: Cheri Fowler, MD;  Location: [facility];  Service: Obstetrics;  Laterality: N/A;  . DILATION AND CURETTAGE OF UTERUS    . HERNIA REPAIR     UHR  . INDUCED ABORTION    . LAPAROSCOPIC CHOLECYSTECTOMY   Aug 2000  . LIPOSUCTION WITH LIPOFILLING Left 11/10/2017   Procedure: FAT GRAFTING TO LEFT UPPER POLL CHEST;  Surgeon: Wallace Going, DO;  Location: [facility];  Service: Plastics;  Laterality: Left;  Marland Kitchen MASTECTOMY Left 12/23/2016  . PARACENTESIS  07/17/2015   tube put in to drain fluid from near liver; US guided/notes 07/17/2015  . PLACEMENT OF BREAST IMPLANTS Right 11/10/2017   Procedure: PLACEMENT OF BREAST IMPLANT RIGHT;  Surgeon: Wallace Going, DO;  Location: [facility];  Service: Plastics;  Laterality: Right;  . PORT-A-CATH REMOVAL Right 02/25/2017   Procedure: REMOVAL PORT-A-CATH;  Surgeon: Wallace Going, DO;  Location: [facility];  Service: Plastics;  Laterality: Right;  . PORTACATH PLACEMENT Right 07/24/2015   Procedure: INSERTION PORT-A-CATH RIGHT SUBCLAVIAN;  Surgeon: [name], MD;  Location: [facility];  Service: General;  Laterality: Right;  . RE-EXCISION OF BREAST LUMPECTOMY Left 11/09/2016   Procedure: RE-EXCISION OF BREAST LUMPECTOMY;  Surgeon: [name], MD;  Location: [facility];  Service: General;  Laterality: Left;  . REMOVAL OF TISSUE EXPANDER AND PLACEMENT OF IMPLANT Left 02/25/2017   Procedure: REMOVAL OF LEFT BREAST  TISSUE EXPANDER AND PLACEMENT OF LEFT SILICONE IMPLANT;  Surgeon: Wallace Going, DO;  Location: [facility];  Service: Plastics;  Laterality: Left;  . TOTAL MASTECTOMY Left 12/23/2016   Procedure: LEFT MASTECTOMY;  Surgeon: [name], MD;  Location: [facility];  Service: General;  Laterality: Left;  . UMBILICAL HERNIA REPAIR  ~ 1983  . WISDOM TOOTH EXTRACTION  2002   Family History  Problem Relation Age of Onset  . Diabetes Maternal Grandmother    Social History:  reports that she has never smoked. She has never used smokeless tobacco. She reports that she drinks alcohol. She reports that she does not use drugs. Allergies: No Known Allergies Medications Prior  to Admission  Medication Sig Dispense Refill  . ALPHA LIPOIC ACID PO Take 1 capsule by mouth daily.    . Multiple Vitamin (MULTIVITAMIN) tablet Take 1 tablet by mouth daily.    . TURMERIC PO Take 1 capsule by mouth daily.      Home: Home Living Family/patient expects to be discharged to:: Private residence Living Arrangements: Children  Functional History:   Functional Status:  Mobility:          ADL:    Cognition: Cognition Orientation Level: Oriented X4    Blood pressure 92/69, pulse 68, temperature 97.7 F (36.5 C), temperature source Oral, resp. rate 19, last menstrual period 04/07/2018, SpO2 100 %. Physical Exam  Constitutional: No distress.  HENT:  Head: Normocephalic and atraumatic.  Eyes: Pupils are equal, round, and reactive to light.  Neck: Normal range of motion.  Cardiovascular: Normal rate.  Respiratory: Effort normal.  GI: Soft.  Neurological:  Patient is alert pleasant and very cooperative.  Alert and oriented x3. RUE with IV/splint. LUE 5/5. Bilateral LE 5/5. No sensory deficits  Skin: She is not diaphoretic.  Psychiatric: She has a normal mood and affect. Her behavior is normal.    Results for orders placed or performed during the hospital encounter of 04/27/18 (from the past 24 hour(s))  I-Stat Beta hCG blood, ED ([facility], [facility], [facility] only)  Status: None   Collection Time: 04/28/18  7:29 AM  Result Value Ref Range   I-stat hCG, quantitative <5.0 <5 mIU/mL   Comment 3          Type and screen All Cardiac and thoracic surgeries, spinal fusions, myomectomies, craniotomies, colon & liver resections, total joint revisions, same day c-section with placenta previa or accreta.     Status: None   Collection Time: 04/28/18  8:53 AM  Result Value Ref Range   ABO/RH(D) A POS    Antibody Screen NEG    Sample Expiration      04/28/2018 Performed at [facility], [place] [address] [place], [place] [address]   Type and screen     Status: None (Preliminary  result)   Collection Time: 04/28/18 11:45 AM  Result Value Ref Range   ABO/RH(D) A POS    Antibody Screen NEG    Sample Expiration      05/01/2018 Performed at [facility], [place] [address] [place], [place] [address]    Unit Number [id number]    Blood Component Type RED CELLS,LR    Unit division 00    Status of Unit ALLOCATED    Transfusion Status OK TO TRANSFUSE    Crossmatch Result Compatible    Unit Number [id number]    Blood Component Type RED CELLS,LR    Unit division 00    Status of Unit ALLOCATED    Transfusion Status OK TO TRANSFUSE    Crossmatch Result Compatible   Prepare RBC     Status: None   Collection Time: 04/28/18 12:15 PM  Result Value Ref Range   Order Confirmation      ORDER PROCESSED BY BLOOD BANK Performed at [facility], [place] [address], [place], [place] [address]   MRSA PCR Screening     Status: None   Collection Time: 04/28/18  8:17 PM  Result Value Ref Range   MRSA by PCR NEGATIVE NEGATIVE   Dg Chest 1 View  Result Date: 04/28/2018 CLINICAL DATA:  T3 corpectomy.  Instrument count. EXAM: CHEST  1 VIEW COMPARISON:  Body CT 07/23/2017 FINDINGS: Endotracheal tube is partially visualized. Esophageal probe appears coiled on itself in the upper thorax. Partially visualized right internal jugular approach central venous catheter. Sternotomy wires are in place. Orthopedic hardware at the level of T3 seen. No radiopaque foreign bodies seen. Cardiomediastinal silhouette is normal. Mediastinal contours appear intact. There is no evidence of focal airspace consolidation, pleural effusion or pneumothorax. Osseous structures are without acute abnormality. Soft tissues are grossly normal. IMPRESSION: No radiopaque foreign bodies seen. The tip of the endotracheal tube is obscured by orthopedic hardware at the level of T3. Electronically Signed   By: Fidela Salisbury M.D.   On: 04/28/2018 18:08   Dg Cervical Spine 1 View  Result Date:  04/28/2018 CLINICAL DATA:  37 y/o  F; cervicalgia. History of breast cancer. EXAM: DG CERVICAL SPINE - 1 VIEW COMPARISON:  08/20/2016 PET-CT. FINDINGS: There is no evidence of cervical spine fracture or prevertebral soft tissue swelling. Alignment is normal. T3-T4 metastasis and compression deformity is obscured by overlying soft tissues. IMPRESSION: Negative cervical spine. T3-T4 metastasis and compression deformity is obscured by overlying soft tissues. Electronically Signed   By: Kristine Garbe M.D.   On: 04/28/2018 05:09   Dg Thoracic Spine 2 View  Result Date: 04/28/2018 CLINICAL DATA:  Localization for T3 corpectomy. EXAM: THORACIC SPINE 2 VIEWS COMPARISON:  MRI yesterday. FINDINGS: Exact counting  is not possible on these images. Initial film show localization in the upper thoracic region. Final film shows corpectomy with anterior plate and screws. Exact levels are indeterminate. IMPRESSION: Upper thoracic corpectomy and fusion. Electronically Signed   By: Nelson Chimes M.D.   On: 04/28/2018 19:09   Mr Thoracic Spine W Wo Contrast  Result Date: 04/27/2018 CLINICAL DATA:  Malignant neoplasm. Sensory level, evaluate for cord compression. EXAM: MRI THORACIC WITHOUT AND WITH CONTRAST TECHNIQUE: Multiplanar and multiecho pulse sequences of the thoracic spine were obtained without and with intravenous contrast. CONTRAST:  5 cc Gadavist intravenous COMPARISON:  None. FINDINGS: MRI THORACIC SPINE FINDINGS Alignment:  Kyphotic deformity due to T3 compression fracture. Vertebrae: There is infiltrative tumor appearance within the T3 and T4 bodies and posterior elements with extraosseous tumor infiltrating the left more than right T3-4 and T4-5 foramina. There is circumferential epidural tumor at the level of T3 with dorsal epidural tumor extending to the level T1 to T8 (based on postcontrast sagittal images). Cord: Cord compression at T3 due to compression fracture with posterior bowing of the cortex  and due to epidural tumor. No visible cord edema. Paraspinal and other soft tissues: Left paravertebral tumor extending into the left third and fourth ribs. Known metastatic disease to the liver. Disc levels: No degenerative changes. Critical Value/emergent results were called by telephone at the time of interpretation on 04/27/2018 at 6:33 pm to Dr. Nicholas Lose , who verbally acknowledged these results. Patient is being transferred to the ER, with who is been notified. 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 Metastatic deposit diffusely infiltrates the T3 and T4 vertebrae with pathologic fracture of the T3 vertebra contributing to cord compression. Extensive epidural tumor spread seen dorsally from T1-T8 (see postcontrast sagittal imaging). T3-4 and T4-5 foraminal compromise by tumor. There is asymmetric left paravertebral extension which infiltrates the left third and fourth ribs. Electronically Signed   By: Monte Fantasia M.D.   On: 04/27/2018 18:41     Assessment/Plan: Diagnosis: pathological fracture T3 s/p T2-T4 fusion. No signs of myelopathy on exam 1. Does the need for close, 24 hr/day medical supervision in concert with the patient's rehab needs make it unreasonable for this patient to be served in a less intensive setting? Potentially 2. Co-Morbidities requiring supervision/potential complications: onc, post-op considerations 3. Due to bladder management, bowel management, safety, skin/wound care, disease management, medication administration and pain management, does the patient require 24 hr/day rehab nursing? Potentially 4. Does the patient require coordinated care of a physician, rehab nurse, PT (1-2 hrs/day, 5 days/week) and OT (1-2 hrs/day, 5 days/week) to address physical and functional deficits in the context of the above medical diagnosis(es)? Potentially Addressing deficits in the following areas: balance, endurance, locomotion, strength, transferring, bowel/bladder control,  bathing, dressing, feeding, grooming, toileting and psychosocial support 5. Can the patient actively participate in an intensive therapy program of at least 3 hrs of therapy per day at least 5 days per week? Yes 6. The potential for patient to make measurable gains while on inpatient rehab is fair 7. Anticipated functional outcomes upon discharge from inpatient rehab are modified independent  with PT, modified independent with OT, n/a with SLP. 8. Estimated rehab length of stay to reach the above functional goals is: TBD 9. Anticipated D/C setting: Home 10. Anticipated post D/C treatments: [place] therapy 11. Overall Rehab/Functional Prognosis: good  RECOMMENDATIONS: This patient's condition is appropriate for continued rehabilitative care in the following setting: Home health vs brief CIR stay Patient has agreed to  participate in recommended program. Yes Note that insurance prior authorization may be required for reimbursement for recommended care.  Comment: Pt moved remarkably well for me on exam this morning. She has a niece at home who will be helping. Depending how she does dynamically when she mobilizes with PT, we can decide upon brief CIR vs home. Rehab Admissions Coordinator to follow up.  Thanks,  Meredith Staggers, MD, Mellody Drown  I have personally performed a face to face diagnostic evaluation of this patient. Additionally, I have reviewed and concur with the physician assistant's documentation above.    Lavon Paganini Angiulli, PA-C 04/29/2018

## 2018-04-29 NOTE — Op Note (Signed)
NAMEMADDUX, FIRST MEDICAL RECORD [id number] ACCOUNT [id number] DATE OF BIRTH:1980/11/20 FAC[facility]: [facility] PHYSICIAN:Donzell Coller Chaya Jan, MD  OPERATIVE REPORT  DATE OF PROCEDURE:  04/28/2018  PREOPERATIVE DIAGNOSIS:  Pathologic fracture of thoracic vertebrae.  POSTOPERATIVE DIAGNOSIS:  Pathologic fracture of thoracic vertebrae.  PROCEDURE:  Closure of median sternotomy with left cervical extension.  SURGEON:  Modesto Charon, MD  ASSISTANT:  None.  ANESTHESIA:  General.  FINDINGS:  Unremarkable closure.  CLINICAL NOTE:  The patient is a 37 year old woman who had undergone fixation of a thoracic vertebral pathologic fracture by Dr. Newman Pies.  Dr. Tharon Aquas Trigt had done a partial sternotomy to provide access and I was asked to close the incision.  OPERATIVE NOTE:  The patient was present in the operating room.  Dr. Arnoldo Morale had completed his repair.  I inspected and there was good hemostasis.  A 15 [place] Blake drain was placed through a small stab incision in the neck and left in the anterior  mediastinal space.  A partial sternotomy to the fourth interspace had been performed.  The sternum was closed with heavy gauge stainless steel wires.  Sternum came together easily.  The wound was copiously irrigated again.  The pectoralis fascia was  closed in standard fashion as were the subcutaneous tissue and skin.  A dressing was applied.  The drain was placed to bulb suction.  It was secured to skin with a 2-0 silk suture.    There was no count preoperatively.  Scanning for sponges showed all clear.  A chest x-ray was done which showed no retained foreign bodies.  AN/NUANCE  D:04/28/2018 T:04/29/2018 JOB:[id number]

## 2018-04-29 NOTE — Evaluation (Signed)
Physical Therapy Evaluation Patient Details Name: Dawn Haynes MRN: [id number] DOB: December 24, 1980 Today's Date: 04/29/2018   History of Present Illness  pt is a 37 y/o female with h/o of stage IV metastatic breast CA, admitted with worsening pain/numbness/tingling and weakness in her lowe extremities and thorax.  MRI showed T3 pathologic fracture with ventral cord compression as well as posterior epidural tumor from T1-T8.  Pt s/p Sternotomy (Vantrigt), T3 corpectomy with interbody prosthesis, T2-3 and T3-4 anterior arthrodesis with bone graftin and anterior instrumentation from T2-T4.    Clinical Impression  Pt admitted with/for T3 pathological fx, s/p resection and fusion via sternotomy.  Pt moving remarkably well with use of the RW and min guard assist.  Have not fully addressed bed mobility.  Pt currently limited functionally due to the problems listed below.  (see problems list.)  Pt will benefit from PT to maximize function and safety to be able to get home safely with available assist.     Follow Up Recommendations Home health PT;Other (comment)(may end up with no follow up.)    Equipment Recommendations  Rolling walker with 5" wheels    Recommendations for Other Services       Precautions / Restrictions Precautions Precautions: Back      Mobility  Bed Mobility               General bed mobility comments: demonstrated, but not witness on eval.  Transfers Overall transfer level: Needs assistance   Transfers: Sit to/from Stand Sit to Stand: Supervision         General transfer comment: instructed in sternal precautions and pt demo'd sit to stand to sit without UE;s and supervision.  Ambulation/Gait Ambulation/Gait assistance: Min guard Gait Distance (Feet): 260 Feet Assistive device: Rolling walker (2 wheeled) Gait Pattern/deviations: Step-through pattern Gait velocity: slower Gait velocity interpretation: <1.8 ft/sec, indicate of risk for recurrent  falls General Gait Details: generally steady with episodes of mild unsteadiness due to mild LE weakness.  Stairs            Wheelchair Mobility    Modified Rankin (Stroke Patients Only)       Balance Overall balance assessment: Needs assistance   Sitting balance-Leahy Scale: Good       Standing balance-Leahy Scale: Fair                               Pertinent Vitals/Pain Pain Assessment: 0-10 Pain Score: 0-No pain    Home Living Family/patient expects to be discharged to:: Private residence Living Arrangements: Children Available Help at Discharge: Family Type of Home: House Home Access: Stairs to enter Entrance Stairs-Rails: Psychiatric nurse of Steps: several Home Layout: Two level;Bed/bath upstairs Home Equipment: None      Prior Function Level of Independence: Independent         Comments: drives for work, managed household     Journalist, newspaper        Extremity/Trunk Assessment                Communication   Communication: No difficulties  Cognition Arousal/Alertness: Awake/alert Behavior During Therapy: WFL for tasks assessed/performed Overall Cognitive Status: Within Functional Limits for tasks assessed                                        General Comments General comments (skin  integrity, edema, etc.): pt instructed in back care/prec, logroll, lifting restrictions, progression of activity and sternal precautions.    Exercises     Assessment/Plan    PT Assessment Patient needs continued PT services  PT Problem List Decreased strength;Decreased activity tolerance;Decreased balance;Decreased mobility;Decreased knowledge of use of DME;Decreased knowledge of precautions       PT Treatment Interventions Gait training;Functional mobility training;Therapeutic activities;Balance training;Patient/family education;Stair training    PT Goals (Current goals can be found in the Care Plan  section)  Acute Rehab PT Goals Patient Stated Goal: get home PT Goal Formulation: With patient Time For Goal Achievement: 05/06/18 Potential to Achieve Goals: Good    Frequency Min 5X/week   Barriers to discharge        Co-evaluation               AM-PAC PT "6 Clicks" Daily Activity  Outcome Measure Difficulty turning over in bed (including adjusting bedclothes, sheets and blankets)?: Unable Difficulty moving from lying on back to sitting on the side of the bed? : Unable Difficulty sitting down on and standing up from a chair with arms (e.g., wheelchair, bedside commode, etc,.)?: None Help needed moving to and from a bed to chair (including a wheelchair)?: A Little Help needed walking in hospital room?: A Little Help needed climbing 3-5 steps with a railing? : A Little 6 Click Score: 15    End of Session   Activity Tolerance: Patient tolerated treatment well Patient left: in chair;with call bell/phone within reach;with family/visitor present Nurse Communication: Mobility status PT Visit Diagnosis: Unsteadiness on feet (R26.81);Other abnormalities of gait and mobility (R26.89);Muscle weakness (generalized) (M62.81)    Time: 3295-1884 PT Time Calculation (min) (ACUTE ONLY): 27 min   Charges:   PT Evaluation $PT Eval Low Complexity: 1 Low PT Treatments $Gait Training: 8-22 mins        04/29/2018  Donnella Sham, PT Acute Rehabilitation Services [phone]  (pager) [phone]  (office)  Tessie Fass Kimorah Ridolfi 04/29/2018, 4:20 PM

## 2018-04-30 MED ORDER — OXYCODONE HCL 5 MG PO TABS: 5.0000 mg | ORAL_TABLET | ORAL | 0 refills | Status: DC | PRN

## 2018-04-30 MED ORDER — CYCLOBENZAPRINE HCL 10 MG PO TABS: 10.0000 mg | ORAL_TABLET | Freq: Three times a day (TID) | ORAL | 0 refills | Status: DC | PRN

## 2018-04-30 MED ORDER — DOCUSATE SODIUM 100 MG PO CAPS: 100.0000 mg | ORAL_CAPSULE | Freq: Two times a day (BID) | ORAL | 0 refills | Status: AC

## 2018-04-30 NOTE — Progress Notes (Signed)
Neurosurgery Service Progress Note  Subjective: No acute events overnight. Smiling and sitting up in bed this morning   Objective: Vitals:   04/30/18 0500 04/30/18 0600 04/30/18 0700 04/30/18 0800  BP: 107/71 101/69 101/72 114/77  Pulse: 85 80 84 82  Resp: (!) 21 (!) 22 (!) 22 (!) 25  Temp:    98.2 F (36.8 C)  TempSrc:    Oral  SpO2: 98% 98% 98% 98%   Temp (24hrs), Avg:98.3 F (36.8 C), Min:98.1 F (36.7 C), Max:98.4 F (36.9 C)  CBC Latest Ref Rng & Units 04/27/2018 01/05/2017 12/15/2016  WBC 4.0 - 10.5 K/uL 7.8 3.7(L) 3.6(L)  Hemoglobin 12.0 - 15.0 g/dL 12.1 11.7 12.2  Hematocrit 36.0 - 46.0 % 38.7 35.7 37.5  Platelets 150 - 400 K/uL 325 196 180   BMP Latest Ref Rng & Units 04/27/2018 07/23/2017 01/05/2017  Glucose 70 - 99 mg/dL 105(H) 100 83  BUN 6 - 20 mg/dL 12 12 10.6  Creatinine 0.44 - 1.00 mg/dL 0.88 0.96 0.9  Sodium 135 - 145 mmol/L 140 141 140  Potassium 3.5 - 5.1 mmol/L 4.3 3.6 4.3  Chloride 98 - 111 mmol/L 101 105 -  CO2 22 - 32 mmol/L 26 26 25   Calcium 8.9 - 10.3 mg/dL 10.0 9.5 9.5    Intake/Output Summary (Last 24 hours) at 04/30/2018 2595 Last data filed at 04/29/2018 2200 Gross per 24 hour  Intake 496.55 ml  Output -  Net 496.55 ml    Current Facility-Administered Medications:  .  0.9 %  sodium chloride infusion (Manually program via Guardrails IV Fluids), , Intravenous, Once, Roberts Gaudy, MD .  0.9 % NaCl with KCl 20 mEq/ L  infusion, , Intravenous, Continuous, Newman Pies, MD, Stopped at 04/29/18 0919 .  acetaminophen (TYLENOL) tablet 650 mg, 650 mg, Oral, Q4H PRN **OR** acetaminophen (TYLENOL) suppository 650 mg, 650 mg, Rectal, Q4H PRN, Newman Pies, MD .  alum & mag hydroxide-simeth (MAALOX/MYLANTA) 200-200-20 MG/5ML suspension 30 mL, 30 mL, Oral, Q6H PRN, Newman Pies, MD .  bisacodyl (DULCOLAX) suppository 10 mg, 10 mg, Rectal, Daily PRN, Newman Pies, MD .  cyclobenzaprine (FLEXERIL) tablet 10 mg, 10 mg, Oral, TID PRN, Newman Pies, MD .  docusate sodium (COLACE) capsule 100 mg, 100 mg, Oral, BID, Newman Pies, MD, 100 mg at 04/29/18 0953 .  lactated ringers infusion, , Intravenous, Continuous, Newman Pies, MD .  menthol-cetylpyridinium (CEPACOL) lozenge 3 mg, 1 lozenge, Oral, PRN **OR** phenol (CHLORASEPTIC) mouth spray 1 spray, 1 spray, Mouth/Throat, PRN, Newman Pies, MD .  morphine 4 MG/ML injection 4 mg, 4 mg, Intravenous, Q2H PRN, Newman Pies, MD .  ondansetron [facility]) tablet 4 mg, 4 mg, Oral, Q6H PRN **OR** ondansetron (ZOFRAN) injection 4 mg, 4 mg, Intravenous, Q6H PRN, Newman Pies, MD .  oxyCODONE (Oxy IR/ROXICODONE) immediate release tablet 10 mg, 10 mg, Oral, Q3H PRN, Newman Pies, MD, 10 mg at 04/28/18 2106 .  oxyCODONE (Oxy IR/ROXICODONE) immediate release tablet 5 mg, 5 mg, Oral, Q3H PRN, Newman Pies, MD .  pantoprazole (PROTONIX) EC tablet 20 mg, 20 mg, Oral, BID, Newman Pies, MD, 20 mg at 04/29/18 2158 .  zolpidem (AMBIEN) tablet 5 mg, 5 mg, Oral, QHS PRN, Newman Pies, MD   Physical Exam: AOx3, PERRL, EOMI, FS, TM, Strength 5/5 x4, SILTx4 Incisions c/d/i  Assessment & Plan: 37 y.o. woman s/p joint NSGY / CT surgery approach for T3 pathologic fracture / tumor, recovering very well. -f/u thoracic recs, from a neurosurgical standpoint, pt can be  discharged home when she is ambulating well and tolerating a regular diet -PT/OT rec home care -SCDs/TEDs, start [facility] tomorrow if pt will be in-house  Judith Part  04/30/18 9:23 AM

## 2018-04-30 NOTE — Progress Notes (Signed)
Physical Therapy Treatment Patient Details Name: Dawn Haynes MRN: [id number] DOB: [birth year] Today's Date: 04/30/2018    History of Present Illness pt is a 37 y/o female with h/o of stage IV metastatic breast CA, admitted with worsening pain/numbness/tingling and weakness in her lowe extremities and thorax.  MRI showed T3 pathologic fracture with ventral cord compression as well as posterior epidural tumor from T1-T8.  Pt s/p Sternotomy (Vantrigt), T3 corpectomy with interbody prosthesis, T2-3 and T3-4 anterior arthrodesis with bone graftin and anterior instrumentation from T2-T4.    PT Comments    Patient progressing well with mobility, ambulating without AD today. Modest instability due to gait deviations and LE deficits but overall doing well. Performed stair negotiation with use of rail. At this time, feel HHPT and intermittent supervision remain appropriate. Will continue to see and progress as tolerated.  Follow Up Recommendations  Home health PT;Other (comment) Intermittent supervision     Equipment Recommendations       Recommendations for Other Services       Precautions / Restrictions Precautions Precautions: Back    Mobility  Bed Mobility Overal bed mobility: Modified Independent             General bed mobility comments: increased time to perform  Transfers Overall transfer level: Needs assistance   Transfers: Sit to/from Stand Sit to Stand: Supervision         General transfer comment: instructed in sternal precautions and pt demo'd sit to stand to sit without UE;s and supervision.  Ambulation/Gait Ambulation/Gait assistance: Min guard Gait Distance (Feet): 300 Feet Assistive device: None Gait Pattern/deviations: Step-through pattern Gait velocity: slower Gait velocity interpretation: <1.31 ft/sec, indicative of household ambulator General Gait Details: some noted instability with ambulation, no physical assist, one noted LOB but able to  correct   Stairs Stairs: Yes Stairs assistance: Supervision Stair Management: One rail Right Number of Stairs: 5 General stair comments: Patient able to perform with use of rail and cues for increased LE elevation   Wheelchair Mobility    Modified Rankin (Stroke Patients Only)       Balance Overall balance assessment: Needs assistance   Sitting balance-Leahy Scale: Good     Standing balance support: During functional activity Standing balance-Leahy Scale: Fair                              Cognition Arousal/Alertness: Awake/alert Behavior During Therapy: WFL for tasks assessed/performed Overall Cognitive Status: Within Functional Limits for tasks assessed                                        Exercises      General Comments        Pertinent Vitals/Pain Pain Assessment: No/denies pain    Home Living                      Prior Function            PT Goals (current goals can now be found in the care plan section) Acute Rehab PT Goals Patient Stated Goal: get home PT Goal Formulation: With patient Time For Goal Achievement: 05/06/18 Potential to Achieve Goals: Good Progress towards PT goals: Progressing toward goals    Frequency    Min 5X/week      PT Plan Current plan remains appropriate  Co-evaluation              AM-PAC PT "6 Clicks" Daily Activity  Outcome Measure  Difficulty turning over in bed (including adjusting bedclothes, sheets and blankets)?: A Little Difficulty moving from lying on back to sitting on the side of the bed? : A Little Difficulty sitting down on and standing up from a chair with arms (e.g., wheelchair, bedside commode, etc,.)?: None Help needed moving to and from a bed to chair (including a wheelchair)?: A Little Help needed walking in hospital room?: A Little Help needed climbing 3-5 steps with a railing? : A Little 6 Click Score: 19    End of Session Equipment  Utilized During Treatment: Gait belt Activity Tolerance: Patient tolerated treatment well Patient left: in chair;with call bell/phone within reach Nurse Communication: Mobility status PT Visit Diagnosis: Unsteadiness on feet (R26.81);Other abnormalities of gait and mobility (R26.89);Muscle weakness (generalized) (M62.81)     Time: 8119-1478 PT Time Calculation (min) (ACUTE ONLY): 19 min  Charges:  $Gait Training: 8-22 mins                     Alben Deeds, PT DPT  Board Certified Neurologic Specialist [place] Pager [phone] Office [phone]    Duncan Dull 04/30/2018, 8:55 AM

## 2018-04-30 NOTE — Discharge Summary (Signed)
Discharge Summary  Date of Admission: 04/27/2018  Date of Discharge: 04/30/18  Attending Physician: Emelda Brothers, MD  Hospital Course: Patient was admitted following an uncomplicated T3 corpecotmy and anterior T2-4 instrumentation in a joint case with CT surgery, who performed a partial sternotomy for access. She was recovered in PACU and transferred to 4N. Her hospital course was uncomplicated and the patient was discharged home on 04/30/18. She will follow up in clinic with Dr. Arnoldo Morale in 2-3 weeks.  Neurologic exam at discharge:  AOx3, PERRL, EOMI, FS, TM Strength 5/5 x4, SILTx4  Judith Part, MD 04/30/18 5:29 PM

## 2018-04-30 NOTE — Discharge Instructions (Signed)
Discharge Instructions  No restriction in activities, slowly increase your activity back to normal.   Okay to shower on the day of discharge. Be gentle when cleaning your incision. Use regular soap and water. If that is uncomfortable, try using baby shampoo. Do not submerge the wound under water for 2 weeks after surgery. If you have a dressing in place, you can remove it on the day of discharge.  You should see Dr. Arnoldo Morale in clinic for a post-operative visit in 2-3 weeks. If you do not already have a visit scheduled, please call the office at [phone] to schedule an appointment.

## 2018-04-30 NOTE — Progress Notes (Signed)
Discharge instructions and education given to patient. No questions. Patient discharged home. Fransico Michael RN BSN.

## 2018-05-01 LAB — BPAM RBC
BLOOD PRODUCT EXPIRATION DATE: 201911142359
BLOOD PRODUCT EXPIRATION DATE: 201911142359
Unit Type and Rh: 6200
Unit Type and Rh: 6200

## 2018-05-01 LAB — TYPE AND SCREEN
ABO/RH(D): A POS
ANTIBODY SCREEN: NEGATIVE
UNIT DIVISION: 0
Unit division: 0

## 2018-05-03 ENCOUNTER — Ambulatory Visit: Payer: Medicaid Other | Admitting: Physical Therapy

## 2018-05-03 ENCOUNTER — Encounter (HOSPITAL_COMMUNITY): Payer: Self-pay | Admitting: Neurosurgery

## 2018-05-09 NOTE — Progress Notes (Signed)
Histology and Location of Primary Cancer:Breast cancer of upper-outer quadrant of left female breast 06-2015,now T3 pathologic fracture, thoracic epidural tumor, thoracic myelopathy, paraparesis.  Sites of Visceral and Bony Metastatic Disease:The MRI demonstrated a T3 pathologic fracture with epidural tumor    Location(s) of Symptomatic Metastases:Upper thoracic pain,  numbness tingling weakness in her thorax and lower extremities from approximately T3 distally.  T3 pathologic fracture. She feels her legs are weak and she has difficulty walking.       Neurosurgery       Dr. Newman Pies  04-27-18  Assessment/Plan: T3 pathologic fracture, thoracic epidural tumor, thoracic myelopathy, paraparesis: I have discussed the situation with the patient and and father.  I have also discussed the patient with Dr. Lanell Persons, radiation oncology.  She will need surgery.   I will transfer her to [facility] as we do not do neurosurgery at [facility].  I discussed this with the patient. He has both anterior and posterior tumor.  Most of her neurocompression appears to be from the pathologic fracture anteriorly at T3.  We have discussed the various treatment options.  I have recommended an anterior T3, and possibly T4 corpectomy with anterior instrumentation and fusion  04-28-18 THORACIC three corpectomy, excision of tumor, fusion and plating  IMPRESSION:04-28-18 Chest 1 View No radiopaque foreign bodies seen.  The tip of the endotracheal tube is obscured by orthopedic hardware at the level of T3.  Electronically Signed By: Fidela Salisbury M.D. On: 04/28/2018 18:08                                   Thoracic Spine 2 View 04-28-18                          IMPRESSION: Upper thoracic corpectomy and fusion.  Electronically Signed By: Nelson Chimes M.D. On: 04/28/2018 19:09  IMPRESSION:04-28-18 Cervical Spine 1 View Negative cervical spine. T3-T4 metastasis and compression  deformity is obscured by overlying soft tissues.   Electronically Signed By: Kristine Garbe M.D. On: 04/28/2018 05:09    Past/Anticipated chemotherapy by medical oncology, if any: Dr. [name]  04-27-18 I discussed the case with Dr. Isidore Moos with radiation oncology so that she is aware of the patient in case she needs urgent radiation  04-27-18 Thoracic Spine W WO Contrast IMPRESSION: Malignant cord compression at T3. Metastatic deposit diffusely infiltrates the T3 and T4 vertebrae with pathologic fracture of the T3 vertebra contributing to cord compression. Extensive epidural tumor spread seen dorsally from T1-T8 (see postcontrast sagittal imaging). T3-4 and T4-5 foraminal compromise by tumor. There is asymmetric left paravertebral extension which infiltrates the left third and fourth ribs.  Electronically Signed By: Monte Fantasia M.D. On: 04/27/2018 18:41   Pain on a scale of 0-10 is: 3/10 back not taking medication using a heating pad asked to be careful using the heating pad  If Spine Met(s), symptoms, if any, include:  Bowel/Bladder retention or incontinence (please describe):  Numbness or weakness in extremities (please describe): Yes numbness tingling weakness in her thorax and lower extremities from approximately T3 distally.  Current Decadron regimen, if applicable:No   Ambulatory status? Walker? Wheelchair?: Ambulatory  Having tingling in her legs and feet improving  SAFETY ISSUES:No  Prior radiation? :04-05-17 05-19-17 Left breast  Pacemaker/ICD? :No  Possible current pregnancy? :No  Is the patient on methotrexate? :No  Current Complaints / other details: Reports  that her abdomen feels hard since her surgery having bowel movements.   Re-Excision of breast lumpectomy left 10-2016  Wt Readings from Last 3 Encounters:  05/12/18 118 lb (53.5 kg)  04/27/18 120 lb 4.8 oz (54.6 kg)  11/10/17 134 lb (60.8 kg)  Pulse (!) 112   Temp 98.5 F  (36.9 C) (Oral)   Ht _0  (1.6 m)   Wt 118 lb (53.5 kg)   SpO2 99%   BMI 20.90 kg/m

## 2018-05-12 ENCOUNTER — Ambulatory Visit
Admission: RE | Admit: 2018-05-12 | Discharge: 2018-05-12 | Disposition: A | Discharge status: Home / Self Care | Payer: Medicaid Other | Source: Ambulatory Visit | Attending: Radiation Oncology | Admitting: Radiation Oncology

## 2018-05-12 ENCOUNTER — Encounter: Payer: Self-pay | Admitting: Hematology and Oncology

## 2018-05-12 ENCOUNTER — Other Ambulatory Visit: Payer: Self-pay | Admitting: Hematology and Oncology

## 2018-05-12 ENCOUNTER — Encounter: Payer: Self-pay | Admitting: Radiation Oncology

## 2018-05-12 ENCOUNTER — Other Ambulatory Visit: Payer: Self-pay

## 2018-05-12 VITALS — HR 112 | Temp 98.5°F | Ht 63.0 in | Wt 118.0 lb

## 2018-05-12 DIAGNOSIS — C7951 Secondary malignant neoplasm of bone: Secondary | ICD-10-CM | POA: Diagnosis not present

## 2018-05-12 DIAGNOSIS — C50912 Malignant neoplasm of unspecified site of left female breast: Secondary | ICD-10-CM | POA: Diagnosis not present

## 2018-05-12 DIAGNOSIS — C787 Secondary malignant neoplasm of liver and intrahepatic bile duct: Secondary | ICD-10-CM | POA: Insufficient documentation

## 2018-05-12 DIAGNOSIS — Z79899 Other long term (current) drug therapy: Secondary | ICD-10-CM | POA: Diagnosis not present

## 2018-05-12 DIAGNOSIS — Z171 Estrogen receptor negative status [ER-]: Secondary | ICD-10-CM | POA: Insufficient documentation

## 2018-05-12 DIAGNOSIS — Z923 Personal history of irradiation: Secondary | ICD-10-CM | POA: Insufficient documentation

## 2018-05-12 DIAGNOSIS — M8448XS Pathological fracture, other site, sequela: Secondary | ICD-10-CM

## 2018-05-12 DIAGNOSIS — C50412 Malignant neoplasm of upper-outer quadrant of left female breast: Secondary | ICD-10-CM

## 2018-05-12 NOTE — Addendum Note (Signed)
Encounter addended by: Malena Edman, RN on: 05/12/2018 2:45 PM  Actions taken: Charge Capture section accepted

## 2018-05-12 NOTE — Progress Notes (Signed)
We will obtain a PET CT scan and follow-up after that

## 2018-05-12 NOTE — Progress Notes (Addendum)
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 This was delivered using a 3-D conformal technique. The patient then received a boost to the seroma. This delivered an additional 10 Gy in 5 fractions using a 3-D technique. The total dose was 60.4 Gy.   Narrative:  Dawn Haynes is a 37 y.o.  female with a history of metastatic breast cancer. 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 On her diagnostic imaging her tumor was 5.3 x 5.2 x 2.7 cm with multiple nodes concerning for adenopathy. Biopsy confirmed disease in the lymph node as well and her original tumor. She had  staging scans revealing concerns for metastatic disease to the liver and peritoneum. She went on to proceed with neoadjuvant chemotherapy in January 2017 and continued this until October 2017. She had a great overall response systemically seen on her post treatment PET scan that revealed hypermetabolic activity along the breast and scarring in the liver with no active malignancy identified in the abdomen or pelvis. She was doing so well that discussion was had about options for local therapy.   She subsequently underwent left lumpectomy on 10/28/16 which revealed a 3.5 cm invasive and in situ ductal carcinoma. The invasive cancer involved all margins and a separate 3.3 cm tumor as well as a 2.1 cm similar tumor was also  noted. Of the 2 sentinel lymph nodes assessed, none contained disease. She went back for re-excision on 10/28/16 revealing residual invasive ductal carcinoma and high grade DCIS along the posterior and anterior margins, lateral and medial margins of the superior excision, and DCIS along the left lateral margin, residual DCIS and IDC was also seen and the medial and anterior margins of the left posterior resection. The patient had another re-excision on 11/09/16 and this still revealed residual disease at the medial, superior, lateral, inferior, and posterior margins.  The patient underwent left mastectomy with tissue expander placement on 12/23/16. Final pathology revealed invasive and in situ ductal carcinoma, grade 3, spanning about 6 cm, with all margins of resection negative for carcinoma. She went on to receive postmastectomy radiotherapy and recently was noted to have disease in the thoracic spine at T3. She underwent corpectomy with fusion and plating on 04/28/18 and comes today for discussion of postop radiotherapy to T3. Of note she is not on systemic therapy.  On review of systems, the patient reports that she is doing well overall but is having some progressive abdominal fullness which makes her feel like it's more difficult to take deep breaths. She denies any chest pain, cough, fevers, chills, night sweats, unintended weight changes. She denies any bowel or bladder disturbances, and denies abdominal pain, nausea or vomiting. She denies any new musculoskeletal or joint aches or pains, new skin lesions or concerns. A complete review of systems is obtained and is otherwise negative.  Past Medical History:  Past Medical  History:  Diagnosis Date  . Back ache   . Breast cancer ([place]) 07/09/2015   left breast  . Breast cancer of upper-outer quadrant of left female breast ([place]) 07/11/2015  . DDD (degenerative disc disease), lumbar    L1-2  . Genetic testing 01/12/2017   [name] underwent genetic  counseling and testing for hereditary cancer syndromes on 01/05/2017. Her results were negative for mutations in all 46 genes analyzed by [employer]'s 46-gene Common Hereditary Cancers Panel. Genes analyzed include: APC, ATM, AXIN2, BARD1, BMPR1A, BRCA1, BRCA2, BRIP1, CDH1, CDKN2A, CHEK2, CTNNA1, DICER1, EPCAM, GREM1, HOXB13, KIT, MEN1, MLH1, MSH2, MSH3, MSH6, MUTYH, NBN,   . Genetic testing of female    [facility] panel negative 01/2017  . History of blood transfusion    "when I lost alot of blood when I went into premature labor"; no abnormal reaction noted  . Weakness    numbness and tingling in both hands    Past Surgical History: Past Surgical History:  Procedure Laterality Date  . BREAST BIOPSY Left 2018  . BREAST LUMPECTOMY WITH RADIOACTIVE SEED AND SENTINEL LYMPH NODE BIOPSY Left 10/28/2016   Procedure: LEFT BREAST LUMPECTOMY WITH BRACKETED RADIOACTIVE SEED AND SENTINEL LYMPH NODE BIOPSY;  Surgeon: [name], MD;  Location: [place];  Service: General;  Laterality: Left;  . BREAST RECONSTRUCTION WITH PLACEMENT OF TISSUE EXPANDER AND FLEX HD (ACELLULAR HYDRATED DERMIS) Left 12/23/2016  . BREAST RECONSTRUCTION WITH PLACEMENT OF TISSUE EXPANDER AND FLEX HD (ACELLULAR HYDRATED DERMIS) Left 12/23/2016   Procedure: IMMEDIATE LEFT BREAST RECONSTRUCTION WITH PLACEMENT OF TISSUE EXPANDER AND FLEX HD (ACELLULAR HYDRATED DERMIS);  Surgeon: Wallace Going, DO;  Location: [place];  Service: Plastics;  Laterality: Left;  . CESAREAN SECTION N/A 01/22/2013   Procedure: primary CESAREAN SECTION of baby boy at 2001;  Surgeon: Cheri Fowler, MD;  Location: [facility];  Service: Obstetrics;  Laterality: N/A;  . DILATION AND CURETTAGE OF UTERUS    . HERNIA REPAIR     UHR  . INDUCED ABORTION    . LAMINECTOMY N/A 04/28/2018   Procedure: THORACIC three corpectomy, excision of tumor, fusion and plating;  Surgeon: Newman Pies, MD;  Location: [place] [facility];  Service: Neurosurgery;  Laterality: N/A;  . LAPAROSCOPIC  CHOLECYSTECTOMY  Aug 2000  . LIPOSUCTION WITH LIPOFILLING Left 11/10/2017   Procedure: FAT GRAFTING TO LEFT UPPER POLL CHEST;  Surgeon: Wallace Going, DO;  Location: [place];  Service: Plastics;  Laterality: Left;  Marland Kitchen MASTECTOMY Left 12/23/2016  . PARACENTESIS  07/17/2015   tube put in to drain fluid from near liver; US guided/notes 07/17/2015  . PLACEMENT OF BREAST IMPLANTS Right 11/10/2017   Procedure: PLACEMENT OF BREAST IMPLANT RIGHT;  Surgeon: Wallace Going, DO;  Location: [place];  Service: Plastics;  Laterality: Right;  . PORT-A-CATH REMOVAL Right 02/25/2017   Procedure: REMOVAL PORT-A-CATH;  Surgeon: Wallace Going, DO;  Location: [place];  Service: Plastics;  Laterality: Right;  . PORTACATH PLACEMENT Right 07/24/2015   Procedure: INSERTION PORT-A-CATH RIGHT SUBCLAVIAN;  Surgeon: [name], MD;  Location: [facility];  Service: General;  Laterality: Right;  . RE-EXCISION OF BREAST LUMPECTOMY Left 11/09/2016   Procedure: RE-EXCISION OF BREAST LUMPECTOMY;  Surgeon: [name], MD;  Location: [place];  Service: General;  Laterality: Left;  . REMOVAL OF TISSUE EXPANDER AND PLACEMENT OF IMPLANT Left 02/25/2017   Procedure: REMOVAL OF LEFT BREAST  TISSUE EXPANDER AND PLACEMENT OF LEFT SILICONE IMPLANT;  Surgeon: Marla Roe,  Loel Lofty, DO;  Location: [place];  Service: Plastics;  Laterality: Left;  . STERNOTOMY N/A 04/28/2018   Procedure: STERNOTOMY;  Surgeon: Prescott Gum, Collier Salina, MD;  Location: [employer];  Service: Open Heart Surgery;  Laterality: N/A;  . TOTAL MASTECTOMY Left 12/23/2016   Procedure: LEFT MASTECTOMY;  Surgeon: [name], MD;  Location: [place];  Service: General;  Laterality: Left;  . UMBILICAL HERNIA REPAIR  ~ 1983  . WISDOM TOOTH EXTRACTION  2002    Social History:  Social History   Socioeconomic History  . Marital status: Married    Spouse name: Not on file  . Number of  children: Not on file  . Years of education: Not on file  . Highest education level: Not on file  Occupational History  . Not on file  Social Needs  . Financial resource strain: Not on file  . Food insecurity:    Worry: Not on file    Inability: Not on file  . Transportation needs:    Medical: No    Non-medical: No  Tobacco Use  . Smoking status: Never Smoker  . Smokeless tobacco: Never Used  Substance and Sexual Activity  . Alcohol use: Yes    Comment: 12/23/2016 "might have a drink a few times/year"  . Drug use: No  . Sexual activity: Yes    Birth control/protection: Condom  Lifestyle  . Physical activity:    Days per week: Not on file    Minutes per session: Not on file  . Stress: Not on file  Relationships  . Social connections:    Talks on phone: Not on file    Gets together: Not on file    Attends religious service: Not on file    Active member of club or organization: Not on file    Attends meetings of clubs or organizations: Not on file    Relationship status: Not on file  . Intimate partner violence:    Fear of current or ex partner: No    Emotionally abused: No    Physically abused: No    Forced sexual activity: No  Other Topics Concern  . Not on file  Social History Narrative  . Not on file    Family History: Family History  Problem Relation Age of Onset  . Diabetes Maternal Grandmother     ALLERGIES:  has No Known Allergies.  Meds: Current Outpatient Medications  Medication Sig Dispense Refill  . ALPHA LIPOIC ACID PO Take 1 capsule by mouth daily.    . Multiple Vitamin (MULTIVITAMIN) tablet Take 1 tablet by mouth daily.    . TURMERIC PO Take 1 capsule by mouth daily.    . cyclobenzaprine (FLEXERIL) 10 MG tablet Take 1 tablet (10 mg total) by mouth 3 (three) times daily as needed for muscle spasms. (Patient not taking: Reported on 05/12/2018) 30 tablet 0  . docusate sodium (COLACE) 100 MG capsule Take 1 capsule (100 mg total) by mouth 2 (two)  times daily. (Patient not taking: Reported on 05/12/2018) 20 capsule 0  . oxyCODONE (OXY IR/ROXICODONE) 5 MG immediate release tablet Take 1 tablet (5 mg total) by mouth every 3 (three) hours as needed (pain). (Patient not taking: Reported on 05/12/2018) 30 tablet 0   No current facility-administered medications for this encounter.     Physical Findings:  height is '5\' 3"'$  (1.6 m) and weight is 118 lb (53.5 kg). Her oral temperature is 98.5 F (36.9 C). Her pulse is 112 (abnormal).  Her oxygen saturation is 99%.  Pain Assessment Pain Score: 3  Pain Frequency: Intermittent Pain Loc: Back/10 In general this is a well appearing African american in no acute distress. She's alert and oriented x4 and appropriate throughout the examination. Cardiopulmonary assessment is negative for acute distress and she exhibits normal effort. The midline incision site is well healed and no erythema is noted. She has fullness at the medial RUQ that is firm to palpation.  Lab Findings: Lab Results  Component Value Date   WBC 7.8 04/27/2018   HGB 12.1 04/27/2018   HCT 38.7 04/27/2018   MCV 93.5 04/27/2018   PLT 325 04/27/2018     Radiographic Findings: Dg Chest 1 View  Result Date: 04/28/2018 CLINICAL DATA:  T3 corpectomy.  Instrument count. EXAM: CHEST  1 VIEW COMPARISON:  Body CT 07/23/2017 FINDINGS: Endotracheal tube is partially visualized. Esophageal probe appears coiled on itself in the upper thorax. Partially visualized right internal jugular approach central venous catheter. Sternotomy wires are in place. Orthopedic hardware at the level of T3 seen. No radiopaque foreign bodies seen. Cardiomediastinal silhouette is normal. Mediastinal contours appear intact. There is no evidence of focal airspace consolidation, pleural effusion or pneumothorax. Osseous structures are without acute abnormality. Soft tissues are grossly normal. IMPRESSION: No radiopaque foreign bodies seen. The tip of the endotracheal tube  is obscured by orthopedic hardware at the level of T3. Electronically Signed   By: Fidela Salisbury M.D.   On: 04/28/2018 18:08   Dg Cervical Spine 1 View  Result Date: 04/28/2018 CLINICAL DATA:  36 y/o  F; cervicalgia. History of breast cancer. EXAM: DG CERVICAL SPINE - 1 VIEW COMPARISON:  08/20/2016 PET-CT. FINDINGS: There is no evidence of cervical spine fracture or prevertebral soft tissue swelling. Alignment is normal. T3-T4 metastasis and compression deformity is obscured by overlying soft tissues. IMPRESSION: Negative cervical spine. T3-T4 metastasis and compression deformity is obscured by overlying soft tissues. Electronically Signed   By: Kristine Garbe M.D.   On: 04/28/2018 05:09   Dg Thoracic Spine 2 View  Result Date: 04/28/2018 CLINICAL DATA:  Localization for T3 corpectomy. EXAM: THORACIC SPINE 2 VIEWS COMPARISON:  MRI yesterday. FINDINGS: Exact counting is not possible on these images. Initial film show localization in the upper thoracic region. Final film shows corpectomy with anterior plate and screws. Exact levels are indeterminate. IMPRESSION: Upper thoracic corpectomy and fusion. Electronically Signed   By: Nelson Chimes M.D.   On: 04/28/2018 19:09   Mr Thoracic Spine W Wo Contrast  Result Date: 04/27/2018 CLINICAL DATA:  Malignant neoplasm. Sensory level, evaluate for cord compression. EXAM: MRI THORACIC WITHOUT AND WITH CONTRAST TECHNIQUE: Multiplanar and multiecho pulse sequences of the thoracic spine were obtained without and with intravenous contrast. CONTRAST:  5 cc Gadavist intravenous COMPARISON:  None. FINDINGS: MRI THORACIC SPINE FINDINGS Alignment:  Kyphotic deformity due to T3 compression fracture. Vertebrae: There is infiltrative tumor appearance within the T3 and T4 bodies and posterior elements with extraosseous tumor infiltrating the left more than right T3-4 and T4-5 foramina. There is circumferential epidural tumor at the level of T3 with dorsal  epidural tumor extending to the level T1 to T8 (based on postcontrast sagittal images). Cord: Cord compression at T3 due to compression fracture with posterior bowing of the cortex and due to epidural tumor. No visible cord edema. Paraspinal and other soft tissues: Left paravertebral tumor extending into the left third and fourth ribs. Known metastatic disease to the liver. Disc levels: No degenerative changes. Critical  Value/emergent results were called by telephone at the time of interpretation on 04/27/2018 at 6:33 pm to Dr. Nicholas Haynes , who verbally acknowledged these results. Patient is being transferred to the ER, with who is been notified. 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 Metastatic deposit diffusely infiltrates the T3 and T4 vertebrae with pathologic fracture of the T3 vertebra contributing to cord compression. Extensive epidural tumor spread seen dorsally from T1-T8 (see postcontrast sagittal imaging). T3-4 and T4-5 foraminal compromise by tumor. There is asymmetric left paravertebral extension which infiltrates the left third and fourth ribs. Electronically Signed   By: Monte Fantasia M.D.   On: 04/27/2018 18:41    Impression/Plan: 1.   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 The patient's case is reviewed and she would benefit from postoperative radiotherapy. She has not seen Dr. [name] since surgery and I will reach out to see when he needs to see her back. We did review a palliative course to T3. We discussed the risks, benefits, short, and long term effects of radiotherapy, and the patient is interested in proceeding. Dr. Lisbeth Renshaw discusses the delivery and logistics of radiotherapy and anticipates a course of 2 weeks of radiotherapy. Written consent is obtained and placed in the chart, a copy was provided to the patient. She will simulate on Monday at 8 am.  2. Abdominal pain  and fullness with history of prior liver disease. I will reach out to Dr. [name] regarding this as she likely will need to begin systemic therapy.    In a visit lasting 30 minutes, greater than 50% of the time was spent face to face discussing her case, and coordinating the patient's care.  The above documentation reflects my direct findings during this shared patient visit. Please see the separate note by Dr. Lisbeth Renshaw on this date for the remainder of the patient's plan of care.     Carola Rhine, PAC

## 2018-05-13 ENCOUNTER — Telehealth: Payer: Self-pay

## 2018-05-13 ENCOUNTER — Ambulatory Visit (HOSPITAL_COMMUNITY)
Admission: RE | Admit: 2018-05-13 | Discharge: 2018-05-13 | Disposition: A | Discharge status: Home / Self Care | Payer: Medicaid Other | Source: Ambulatory Visit | Attending: Medical | Admitting: Medical

## 2018-05-13 ENCOUNTER — Inpatient Hospital Stay: Payer: Medicaid Other | Attending: Medical | Admitting: Medical

## 2018-05-13 ENCOUNTER — Ambulatory Visit: Payer: Medicaid Other | Admitting: Medical

## 2018-05-13 ENCOUNTER — Inpatient Hospital Stay (HOSPITAL_BASED_OUTPATIENT_CLINIC_OR_DEPARTMENT_OTHER): Payer: Medicaid Other | Admitting: Medical

## 2018-05-13 ENCOUNTER — Telehealth: Payer: Self-pay | Admitting: Hematology and Oncology

## 2018-05-13 VITALS — BP 109/85 | HR 113 | Temp 98.4°F | Resp 17 | Ht 63.0 in | Wt 119.1 lb

## 2018-05-13 DIAGNOSIS — R000 Tachycardia, unspecified: Secondary | ICD-10-CM | POA: Diagnosis present

## 2018-05-13 DIAGNOSIS — R0602 Shortness of breath: Secondary | ICD-10-CM

## 2018-05-13 DIAGNOSIS — R140 Abdominal distension (gaseous): Secondary | ICD-10-CM | POA: Diagnosis not present

## 2018-05-13 DIAGNOSIS — R180 Malignant ascites: Secondary | ICD-10-CM | POA: Insufficient documentation

## 2018-05-13 DIAGNOSIS — C50919 Malignant neoplasm of unspecified site of unspecified female breast: Secondary | ICD-10-CM

## 2018-05-13 DIAGNOSIS — Z923 Personal history of irradiation: Secondary | ICD-10-CM | POA: Diagnosis not present

## 2018-05-13 DIAGNOSIS — C7951 Secondary malignant neoplasm of bone: Secondary | ICD-10-CM | POA: Diagnosis not present

## 2018-05-13 DIAGNOSIS — Z9012 Acquired absence of left breast and nipple: Secondary | ICD-10-CM | POA: Diagnosis not present

## 2018-05-13 DIAGNOSIS — Z9221 Personal history of antineoplastic chemotherapy: Secondary | ICD-10-CM | POA: Insufficient documentation

## 2018-05-13 DIAGNOSIS — C50412 Malignant neoplasm of upper-outer quadrant of left female breast: Secondary | ICD-10-CM

## 2018-05-13 LAB — CBC WITH DIFFERENTIAL (CANCER CENTER ONLY)
Abs Immature Granulocytes: 0.05 10*3/uL (ref 0.00–0.07)
BASOS ABS: 0 10*3/uL (ref 0.0–0.1)
Basophils Relative: 0 %
EOS ABS: 0 10*3/uL (ref 0.0–0.5)
Eosinophils Relative: 0 %
HEMATOCRIT: 36.8 % (ref 36.0–46.0)
HEMOGLOBIN: 11.4 g/dL — AB (ref 12.0–15.0)
Immature Granulocytes: 0 %
LYMPHS ABS: 1.3 10*3/uL (ref 0.7–4.0)
Lymphocytes Relative: 12 %
MCH: 28.4 pg (ref 26.0–34.0)
MCHC: 31 g/dL (ref 30.0–36.0)
MCV: 91.5 fL (ref 80.0–100.0)
MONOS PCT: 11 %
Monocytes Absolute: 1.3 10*3/uL — ABNORMAL HIGH (ref 0.1–1.0)
NEUTROS PCT: 77 %
NRBC: 0 % (ref 0.0–0.2)
Neutro Abs: 8.6 10*3/uL — ABNORMAL HIGH (ref 1.7–7.7)
Platelet Count: 452 10*3/uL — ABNORMAL HIGH (ref 150–400)
RBC: 4.02 MIL/uL (ref 3.87–5.11)
RDW: 12.6 % (ref 11.5–15.5)
WBC Count: 11.3 10*3/uL — ABNORMAL HIGH (ref 4.0–10.5)

## 2018-05-13 LAB — CMP (CANCER CENTER ONLY)
ALBUMIN: 3.3 g/dL — AB (ref 3.5–5.0)
ALT: 82 U/L — ABNORMAL HIGH (ref 0–44)
ANION GAP: 14 (ref 5–15)
AST: 129 U/L — AB (ref 15–41)
Alkaline Phosphatase: 252 U/L — ABNORMAL HIGH (ref 38–126)
BILIRUBIN TOTAL: 1.2 mg/dL (ref 0.3–1.2)
BUN: 10 mg/dL (ref 6–20)
CO2: 26 mmol/L (ref 22–32)
Calcium: 10.3 mg/dL (ref 8.9–10.3)
Chloride: 99 mmol/L (ref 98–111)
Creatinine: 0.79 mg/dL (ref 0.44–1.00)
GFR, Est AFR Am: >60 mL/min (ref >60)
GFR, Estimated: >60 mL/min (ref >60)
Glucose, Bld: 76 mg/dL (ref 70–99)
POTASSIUM: 4.1 mmol/L (ref 3.5–5.1)
Sodium: 139 mmol/L (ref 135–145)
TOTAL PROTEIN: 8.8 g/dL — AB (ref 6.5–8.1)

## 2018-05-13 NOTE — Telephone Encounter (Signed)
Pt called reporting some palpitation and a hard stomach. Pt denies any dizziness, nausea, vomiting, and constipation. She denies any issues with eating. Pt is also complaining of constant sob. She would like to come in to see Dr.Gudena today.   Told pt that Dr.Gudena is out of the office today. Pt is a metastatic breast pt with recent MRI showing T3 pathologic fracture, with ventral cord compression as well as posterior epidural tumor from T1-T8. She had closure of median sternotomy with left cervical extension a few weeks ago.   Pt stated that she had been feeling sob/increased heart rate and abdominal distention for the past few days. Pt will be starting radiation next week. Made appt to have pt see SM today. Told pt to come with someone, just in case she will need assistance and to remain NPO. Pt has a Pet scan ordered, but currently awaiting approval prior to scheduling. Pt verbalized understanding and will be here this morning.

## 2018-05-13 NOTE — Telephone Encounter (Signed)
Scheduled appt per 10/31 sch message - pt is aware of appt date and time.

## 2018-05-13 NOTE — Patient Instructions (Signed)
Chest X-Ray A chest X-ray is a painless test that uses radiation to create images of the structures inside of your chest. Chest X-rays are used to look for many health conditions, including heart failure, pneumonia, tuberculosis, rib fractures, breathing disorders, and cancer. They may be used to diagnose chest pain, constant coughing, or trouble breathing. Tell a health care provider about:  Any allergies you have.  All medicines you are taking, including vitamins, herbs, eye drops, creams, and over-the-counter medicines.  Any surgeries you have had.  Any medical conditions you have.  Whether you are pregnant or may be pregnant. What are the risks? Getting a chest X-ray is a safe procedure. However, you will be exposed to a small amount of radiation. Being exposed to too much radiation over a lifetime can increase the risk of cancer. This risk is small, but it may occur if you have many X-rays throughout your life. What happens before the procedure?  You may be asked to remove glasses, jewelry, and any other metal objects.  You will be asked to undress from the waist up. You may be given a hospital gown to wear.  You may be asked to wear a protective lead apron to protect parts of your body from radiation. What happens during the procedure?  You will be asked to stand still as each picture is taken to get the best possible images.  You will be asked to take a deep breath and hold your breath for a few seconds.  The X-ray machine will create a picture of your chest using a tiny burst of radiation. This is painless.  More pictures may be taken from other angles. Typically, one picture will be taken while you face the X-ray camera, and another picture will be taken from the side while you stand. If you cannot stand, you may be asked to lie down. The procedure may vary among health care providers and hospitals. What happens after the procedure?  The X-ray(s) will be reviewed by your  health care provider or an X-ray (radiology) specialist.  It is up to you to get your test results. Ask your health care provider, or the department that is doing the test, when your results will be ready.  Your health care provider will tell you if you need more tests or a follow-up exam. Keep all follow-up visits as told by your health care provider. This is important. Summary  A chest X-ray is a safe, painless test that is used to examine the inside of the chest, heart, and lungs.  You will need to undress from the waist up and remove jewelry and metal objects before the procedure.  You will be exposed to a small amount of radiation during the procedure.  The X-ray machine will take one or more pictures of your chest while you remain as still as possible.  Later, a health care provider or specialist will review the test results with you. This information is not intended to replace advice given to you by your health care provider. Make sure you discuss any questions you have with your health care provider. Document Released: 08/25/2016 Document Revised: 08/25/2016 Document Reviewed: 08/25/2016 Elsevier Interactive Patient Education  2018 [employer] Inc[employer]  

## 2018-05-13 NOTE — Progress Notes (Signed)
Pt presents with elevated HR at rest that causes gen weakness and fatigue as well as DOE.  Denies CP or dizziness.  Upper abdomen firm and nontender to touch.  Pt denies changes in bowel habits, states that she is regular and has no issues.  Denies recent injury.  A&Ox4.  Ambulatory w/steady gait.  EKG performed by NTs Cheryl & Mel, given to [facility] for assessment.

## 2018-05-14 ENCOUNTER — Encounter: Payer: Self-pay | Admitting: Hematology and Oncology

## 2018-05-15 NOTE — Progress Notes (Signed)
These results were called to [name] and were reviewed with her . Her were answered. She expressed understanding.

## 2018-05-15 NOTE — Progress Notes (Signed)
These preliminary result these preliminary results were noted.  Awaiting final report.

## 2018-05-16 ENCOUNTER — Other Ambulatory Visit: Payer: Medicaid Other

## 2018-05-16 ENCOUNTER — Ambulatory Visit
Admission: RE | Admit: 2018-05-16 | Discharge: 2018-05-16 | Disposition: A | Discharge status: Home / Self Care | Payer: Medicaid Other | Source: Ambulatory Visit | Attending: Radiation Oncology | Admitting: Radiation Oncology

## 2018-05-16 DIAGNOSIS — C50912 Malignant neoplasm of unspecified site of left female breast: Secondary | ICD-10-CM | POA: Insufficient documentation

## 2018-05-16 DIAGNOSIS — Z510 Encounter for antineoplastic radiation therapy: Secondary | ICD-10-CM | POA: Diagnosis not present

## 2018-05-16 DIAGNOSIS — C7951 Secondary malignant neoplasm of bone: Secondary | ICD-10-CM | POA: Insufficient documentation

## 2018-05-17 ENCOUNTER — Telehealth: Payer: Self-pay

## 2018-05-17 ENCOUNTER — Other Ambulatory Visit: Payer: Self-pay

## 2018-05-17 DIAGNOSIS — R000 Tachycardia, unspecified: Secondary | ICD-10-CM

## 2018-05-17 MED ORDER — METOPROLOL TARTRATE 25 MG PO TABS: 25.0000 mg | ORAL_TABLET | Freq: Two times a day (BID) | ORAL | 0 refills | Status: DC

## 2018-05-17 NOTE — Progress Notes (Signed)
Per Dr.Gudena, referral to Dr.Bensimhon sent to evaluate persistent tachycardia and chest tightness.

## 2018-05-17 NOTE — Telephone Encounter (Signed)
Pt called to report that she is still having worsening of her tachycardia. She is unable to walk very much due to HR increasing in the 140's -150's, sob, chest tightness, and feeling of passing out. Pt states that this has been worse than last week, when she came to see symptom management. Her hr at rest is in the 110's-120's. She also complains of a lump below her breast on the left upper and mid quadrant of her abdomen, that feels very hard to touch.   Discussed with Dr.Gudena, and would like for pt to see Dr.Bensimhon for cardiology workup and evaluation. Meanwhile, pt to take metoprolol 25mg  BID, until seen by cardiology.   Referral sent to cardiology and will send escript for metoprolol, per MD orders.  Pt verbalized understanding.

## 2018-05-18 ENCOUNTER — Encounter: Payer: Self-pay | Admitting: Cardiology

## 2018-05-18 NOTE — Progress Notes (Signed)
Cardiology Office Note   Date:  05/19/2018   ID:  Dawn Haynes, DOB 1980/08/10, MRN [id number] ta PCP:  [name], PA  Cardiologist:   No primary care provider on file. Referring:  Harle Stanford., PA-C  Chief Complaint  Patient presents with  . Tachycardia      History of Present Illness: Dawn Haynes is a 37 y.o. female who is referred by Harle Stanford., PA-C for evaluation of tachycardia.  She unfortunately has had metastatic cancer.  However, this was performed initially very well to chemotherapy.  Unfortunately recently she is had abdominal pain and distention.  An ultrasound that was done recently did not demonstrate any ascites.  However, she is been very uncomfortable in her abdomen.  She had a lesion resected from her thoracic spine with probable metastasis and is having further imaging with plans for further therapy.  She is been noted to be tachycardic.  She actually had a syncopal episode because she was so uncomfortable and her heart was going fast.  She says she was walking to the store when this happened.  Her heart rate resting on her apple watch has been 114 and with any activity 146.  She was short of breath.  She was not describing PND or orthopnea.  She is not having any chest pressure, neck or arm discomfort other than where she is had incision in her sternum to get to her vertebrae.  She has not had any fevers or chills.  She has not had any prior cardiac history.  Of note she was treated with carboplatin and gemcitabine in 2017.   Past Medical History:  Diagnosis Date  . Breast cancer of upper-outer quadrant of left female breast ([place]) 07/11/2015  . DDD (degenerative disc disease), lumbar    L1-2  . Genetic testing 01/12/2017   [name] underwent genetic counseling and testing for hereditary cancer syndromes on 01/05/2017. Her results were negative for mutations in all 46 genes analyzed by [employer]'s 46-gene Common Hereditary Cancers Panel. Genes analyzed  include: APC, ATM, AXIN2, BARD1, BMPR1A, BRCA1, BRCA2, BRIP1, CDH1, CDKN2A, CHEK2, CTNNA1, DICER1, EPCAM, GREM1, HOXB13, KIT, MEN1, MLH1, MSH2, MSH3, MSH6, MUTYH, NBN,   . Genetic testing of female    [employer] panel negative 01/2017  . History of blood transfusion    "when I lost alot of blood when I went into premature labor"; no abnormal reaction noted    Past Surgical History:  Procedure Laterality Date  . BREAST BIOPSY Left 2018  . BREAST LUMPECTOMY WITH RADIOACTIVE SEED AND SENTINEL LYMPH NODE BIOPSY Left 10/28/2016   Procedure: LEFT BREAST LUMPECTOMY WITH BRACKETED RADIOACTIVE SEED AND SENTINEL LYMPH NODE BIOPSY;  Surgeon: [name], MD;  Location: [place];  Service: General;  Laterality: Left;  . BREAST RECONSTRUCTION WITH PLACEMENT OF TISSUE EXPANDER AND FLEX HD (ACELLULAR HYDRATED DERMIS) Left 12/23/2016  . BREAST RECONSTRUCTION WITH PLACEMENT OF TISSUE EXPANDER AND FLEX HD (ACELLULAR HYDRATED DERMIS) Left 12/23/2016   Procedure: IMMEDIATE LEFT BREAST RECONSTRUCTION WITH PLACEMENT OF TISSUE EXPANDER AND FLEX HD (ACELLULAR HYDRATED DERMIS);  Surgeon: Wallace Going, DO;  Location: [place];  Service: Plastics;  Laterality: Left;  . CESAREAN SECTION N/A 01/22/2013   Procedure: primary CESAREAN SECTION of baby boy at 2001;  Surgeon: Cheri Fowler, MD;  Location: [facility];  Service: Obstetrics;  Laterality: N/A;  . DILATION AND CURETTAGE OF UTERUS    . HERNIA REPAIR     UHR  . INDUCED ABORTION    .  LAMINECTOMY N/A 04/28/2018   Procedure: THORACIC three corpectomy, excision of tumor, fusion and plating;  Surgeon: Newman Pies, MD;  Location: [place];  Service: Neurosurgery;  Laterality: N/A;  . LAPAROSCOPIC CHOLECYSTECTOMY  Aug 2000  . LIPOSUCTION WITH LIPOFILLING Left 11/10/2017   Procedure: FAT GRAFTING TO LEFT UPPER POLL CHEST;  Surgeon: Wallace Going, DO;  Location: [place];  Service: Plastics;  Laterality: Left;  Marland Kitchen MASTECTOMY Left 12/23/2016  . PARACENTESIS   07/17/2015   tube put in to drain fluid from near liver; US guided/notes 07/17/2015  . PLACEMENT OF BREAST IMPLANTS Right 11/10/2017   Procedure: PLACEMENT OF BREAST IMPLANT RIGHT;  Surgeon: Wallace Going, DO;  Location: [place];  Service: Plastics;  Laterality: Right;  . PORT-A-CATH REMOVAL Right 02/25/2017   Procedure: REMOVAL PORT-A-CATH;  Surgeon: Wallace Going, DO;  Location: [place];  Service: Plastics;  Laterality: Right;  . PORTACATH PLACEMENT Right 07/24/2015   Procedure: INSERTION PORT-A-CATH RIGHT SUBCLAVIAN;  Surgeon: [name], MD;  Location: [facility];  Service: General;  Laterality: Right;  . RE-EXCISION OF BREAST LUMPECTOMY Left 11/09/2016   Procedure: RE-EXCISION OF BREAST LUMPECTOMY;  Surgeon: [name], MD;  Location: [place];  Service: General;  Laterality: Left;  . REMOVAL OF TISSUE EXPANDER AND PLACEMENT OF IMPLANT Left 02/25/2017   Procedure: REMOVAL OF LEFT BREAST  TISSUE EXPANDER AND PLACEMENT OF LEFT SILICONE IMPLANT;  Surgeon: Wallace Going, DO;  Location: [place];  Service: Plastics;  Laterality: Left;  . STERNOTOMY N/A 04/28/2018   Procedure: STERNOTOMY;  Surgeon: Prescott Gum, Collier Salina, MD;  Location: [place];  Service: Open Heart Surgery;  Laterality: N/A;  . TOTAL MASTECTOMY Left 12/23/2016   Procedure: LEFT MASTECTOMY;  Surgeon: [name], MD;  Location: [place];  Service: General;  Laterality: Left;  . UMBILICAL HERNIA REPAIR  ~ 1983  . WISDOM TOOTH EXTRACTION  2002     Current Outpatient Medications  Medication Sig Dispense Refill  . ALPHA LIPOIC ACID PO Take 1 capsule by mouth daily.    . cyclobenzaprine (FLEXERIL) 10 MG tablet Take 1 tablet (10 mg total) by mouth 3 (three) times daily as needed for muscle spasms. 30 tablet 0  . docusate sodium (COLACE) 100 MG capsule Take 1 capsule (100 mg total) by mouth 2 (two) times daily. 20 capsule 0  . metoprolol tartrate (LOPRESSOR)  25 MG tablet Take 1 tablet (25 mg total) by mouth 2 (two) times daily. 60 tablet 0  . Multiple Vitamin (MULTIVITAMIN) tablet Take 1 tablet by mouth daily.    Marland Kitchen oxyCODONE (OXY IR/ROXICODONE) 5 MG immediate release tablet Take 1 tablet (5 mg total) by mouth every 3 (three) hours as needed (pain). 30 tablet 0  . TURMERIC PO Take 1 capsule by mouth daily.     No current facility-administered medications for this visit.     Allergies:   Patient has no known allergies.    Social History:  The patient  reports that she has never smoked. She has never used smokeless tobacco. She reports that she drinks alcohol. She reports that she does not use drugs.   Family History:  The patient's family history includes Diabetes in her maternal grandmother; Hypertension in her mother.    ROS:  Please see the history of present illness.   Otherwise, review of systems are positive for none.   All other systems are reviewed and negative.    PHYSICAL EXAM: VS:  Ht _0  (1.6 m)   Wt 118 lb 12.8 oz (53.9 kg)   LMP  (LMP Unknown)   BMI 21.04 kg/m  , BMI Body mass index is 21.04 kg/m. GENERAL: Slightly chronically ill-appearing appearing HEENT:  Pupils equal round and reactive, fundi not visualized, oral mucosa unremarkable NECK:  No jugular venous distention, waveform within normal limits, carotid upstroke brisk and symmetric, no bruits, no thyromegaly LYMPHATICS:  No cervical, inguinal adenopathy LUNGS:  Clear to auscultation bilaterally BACK:  No CVA tenderness HEART:  PMI not displaced or sustained,S1 and S2 within normal limits, no S3, no S4, no clicks, no rubs, no murmurs ABD: Mildly distended, positive bowel sounds normal in frequency in pitch, no bruits, no rebound, no guarding, no midline pulsatile mass, no hepatomegaly, no splenomegaly EXT:  2 plus pulses throughout, no edema, no cyanosis no clubbing SKIN:  No rashes no nodules NEURO:  Cranial nerves II through XII grossly intact, motor grossly  intact throughout PSYCH:  Cognitively intact, oriented to person place and time    EKG:  EKG is not ordered today. The ekg ordered 05/13/18 demonstrates sinus tachycardia, rate 118, axis within normal limits, intervals normal no acute ST-T   Recent Labs: 05/13/2018: ALT 82; BUN 10; Creatinine 0.79; Hemoglobin 11.4; Platelet Count 452; Potassium 4.1; Sodium 139    Lipid Panel No results found for: CHOL, TRIG, HDL, CHOLHDL, VLDL, LDLCALC, LDLDIRECT    Wt Readings from Last 3 Encounters:  05/19/18 118 lb 12.8 oz (53.9 kg)  05/13/18 119 lb 1.6 oz (54 kg)  05/12/18 118 lb (53.5 kg)      Other studies Reviewed: Additional studies/ records that were reviewed today include: Extensive hospital records. Review of the above records demonstrates:  Please see elsewhere in the note.     ASSESSMENT AND PLAN:  TACHYCARDIA: I think this is probably related to pain, deconditioning.  There was no pulses paradox on exam or neck vein elevation.  However, given the shortness of breath and discomfort I am going to check an echocardiogram.  Some of her tachycardia also could be related to some dehydration as there was some orthostatic change.  I think is reasonable to use a very small dose of beta-blocker but is probably the underlying condition causing the problem.   Current medicines are reviewed at length with the patient today.  The patient does not have concerns regarding medicines.  The following changes have been made:  no change  Labs/ tests ordered today include: None  Orders Placed This Encounter  Procedures  . ECHOCARDIOGRAM COMPLETE     Disposition:   FU with me as needed     Signed, Minus Breeding, MD  05/19/2018 3:17 PM    [facility]

## 2018-05-19 ENCOUNTER — Ambulatory Visit (INDEPENDENT_AMBULATORY_CARE_PROVIDER_SITE_OTHER): Payer: Medicaid Other | Admitting: Cardiology

## 2018-05-19 ENCOUNTER — Encounter: Payer: Self-pay | Admitting: Cardiology

## 2018-05-19 VITALS — BP 100/67 | HR 115 | Ht 63.0 in | Wt 118.8 lb

## 2018-05-19 DIAGNOSIS — R000 Tachycardia, unspecified: Secondary | ICD-10-CM | POA: Diagnosis not present

## 2018-05-19 DIAGNOSIS — Z510 Encounter for antineoplastic radiation therapy: Secondary | ICD-10-CM | POA: Diagnosis not present

## 2018-05-19 NOTE — Patient Instructions (Signed)
Medication Instructions:  Continue current medications  If you need a refill on your cardiac medications before your next appointment, please call your pharmacy.  Labwork: None Ordered   If you have labs (blood work) drawn today and your tests are completely normal, you will receive your results only by: . MyChart Message (if you have MyChart) OR . A paper copy in the mail If you have any lab test that is abnormal or we need to change your treatment, we will call you to review the results.  Testing/Procedures: Your physician has requested that you have an echocardiogram. Echocardiography is a painless test that uses sound waves to create images of your heart. It provides your doctor with information about the size and shape of your heart and how well your heart's chambers and valves are working. This procedure takes approximately one hour. There are no restrictions for this procedure.  Follow-Up: . You will need a follow up appointment in As Needed.   At [facility], you and your health needs are our priority.  As part of our continuing mission to provide you with exceptional heart care, we have created designated Provider Care Teams.  These Care Teams include your primary Cardiologist (physician) and Advanced Practice Providers (APPs -  Physician Assistants and Nurse Practitioners) who all work together to provide you with the care you need, when you need it.   Thank you for choosing [facility] at [facility]!!       
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
## 2018-05-23 ENCOUNTER — Other Ambulatory Visit: Payer: Self-pay

## 2018-05-23 ENCOUNTER — Emergency Department (HOSPITAL_COMMUNITY): Payer: Medicaid Other

## 2018-05-23 ENCOUNTER — Inpatient Hospital Stay (HOSPITAL_COMMUNITY)
Admission: EM | Admit: 2018-05-23 | Discharge: 2018-05-31 | DRG: 271 | Disposition: A | Discharge status: Home / Self Care | Payer: Medicaid Other | Attending: Infectious Disease | Admitting: Infectious Disease

## 2018-05-23 ENCOUNTER — Encounter (HOSPITAL_COMMUNITY): Payer: Self-pay | Admitting: Radiology

## 2018-05-23 ENCOUNTER — Ambulatory Visit (HOSPITAL_COMMUNITY): Admission: RE | Admit: 2018-05-23 | Payer: Medicaid Other | Source: Ambulatory Visit

## 2018-05-23 ENCOUNTER — Encounter (HOSPITAL_COMMUNITY): Payer: Self-pay

## 2018-05-23 DIAGNOSIS — R079 Chest pain, unspecified: Secondary | ICD-10-CM | POA: Diagnosis not present

## 2018-05-23 DIAGNOSIS — C7951 Secondary malignant neoplasm of bone: Secondary | ICD-10-CM | POA: Diagnosis present

## 2018-05-23 DIAGNOSIS — C7800 Secondary malignant neoplasm of unspecified lung: Secondary | ICD-10-CM | POA: Diagnosis present

## 2018-05-23 DIAGNOSIS — Z171 Estrogen receptor negative status [ER-]: Secondary | ICD-10-CM

## 2018-05-23 DIAGNOSIS — C771 Secondary and unspecified malignant neoplasm of intrathoracic lymph nodes: Secondary | ICD-10-CM | POA: Diagnosis present

## 2018-05-23 DIAGNOSIS — G9529 Other cord compression: Secondary | ICD-10-CM | POA: Diagnosis present

## 2018-05-23 DIAGNOSIS — E876 Hypokalemia: Secondary | ICD-10-CM | POA: Diagnosis present

## 2018-05-23 DIAGNOSIS — C50412 Malignant neoplasm of upper-outer quadrant of left female breast: Secondary | ICD-10-CM | POA: Diagnosis present

## 2018-05-23 DIAGNOSIS — M5136 Other intervertebral disc degeneration, lumbar region: Secondary | ICD-10-CM | POA: Diagnosis present

## 2018-05-23 DIAGNOSIS — G9520 Unspecified cord compression: Secondary | ICD-10-CM | POA: Diagnosis not present

## 2018-05-23 DIAGNOSIS — R0602 Shortness of breath: Secondary | ICD-10-CM

## 2018-05-23 DIAGNOSIS — Z23 Encounter for immunization: Secondary | ICD-10-CM

## 2018-05-23 DIAGNOSIS — Z9221 Personal history of antineoplastic chemotherapy: Secondary | ICD-10-CM | POA: Diagnosis not present

## 2018-05-23 DIAGNOSIS — I313 Pericardial effusion (noninflammatory): Principal | ICD-10-CM | POA: Diagnosis present

## 2018-05-23 DIAGNOSIS — Z9049 Acquired absence of other specified parts of digestive tract: Secondary | ICD-10-CM

## 2018-05-23 DIAGNOSIS — Z9689 Presence of other specified functional implants: Secondary | ICD-10-CM

## 2018-05-23 DIAGNOSIS — M8458XA Pathological fracture in neoplastic disease, other specified site, initial encounter for fracture: Secondary | ICD-10-CM | POA: Diagnosis present

## 2018-05-23 DIAGNOSIS — E440 Moderate protein-calorie malnutrition: Secondary | ICD-10-CM | POA: Diagnosis present

## 2018-05-23 DIAGNOSIS — D62 Acute posthemorrhagic anemia: Secondary | ICD-10-CM | POA: Diagnosis not present

## 2018-05-23 DIAGNOSIS — E871 Hypo-osmolality and hyponatremia: Secondary | ICD-10-CM | POA: Diagnosis not present

## 2018-05-23 DIAGNOSIS — K5900 Constipation, unspecified: Secondary | ICD-10-CM | POA: Diagnosis not present

## 2018-05-23 DIAGNOSIS — Z170 Estrogen receptor positive status [ER+]: Secondary | ICD-10-CM | POA: Diagnosis not present

## 2018-05-23 DIAGNOSIS — R740 Nonspecific elevation of levels of transaminase and lactic acid dehydrogenase [LDH]: Secondary | ICD-10-CM | POA: Diagnosis present

## 2018-05-23 DIAGNOSIS — Z8249 Family history of ischemic heart disease and other diseases of the circulatory system: Secondary | ICD-10-CM

## 2018-05-23 DIAGNOSIS — I10 Essential (primary) hypertension: Secondary | ICD-10-CM | POA: Diagnosis present

## 2018-05-23 DIAGNOSIS — I3139 Other pericardial effusion (noninflammatory): Secondary | ICD-10-CM

## 2018-05-23 DIAGNOSIS — C787 Secondary malignant neoplasm of liver and intrahepatic bile duct: Secondary | ICD-10-CM | POA: Diagnosis present

## 2018-05-23 DIAGNOSIS — Z789 Other specified health status: Secondary | ICD-10-CM

## 2018-05-23 DIAGNOSIS — Z833 Family history of diabetes mellitus: Secondary | ICD-10-CM

## 2018-05-23 DIAGNOSIS — Z9012 Acquired absence of left breast and nipple: Secondary | ICD-10-CM

## 2018-05-23 DIAGNOSIS — Z5111 Encounter for antineoplastic chemotherapy: Secondary | ICD-10-CM

## 2018-05-23 LAB — I-STAT BETA HCG BLOOD, ED (NOT ORDERABLE)

## 2018-05-23 LAB — BASIC METABOLIC PANEL
Anion gap: 14 (ref 5–15)
BUN: 8 mg/dL (ref 6–20)
CALCIUM: 9.2 mg/dL (ref 8.9–10.3)
CHLORIDE: 94 mmol/L — AB (ref 98–111)
CO2: 28 mmol/L (ref 22–32)
CREATININE: 0.68 mg/dL (ref 0.44–1.00)
GFR calc non Af Amer: >60 mL/min (ref >60)
Glucose, Bld: 93 mg/dL (ref 70–99)
Potassium: 3.3 mmol/L — ABNORMAL LOW (ref 3.5–5.1)
SODIUM: 136 mmol/L (ref 135–145)

## 2018-05-23 LAB — CBC
HEMATOCRIT: 33.7 % — AB (ref 36.0–46.0)
Hemoglobin: 10.2 g/dL — ABNORMAL LOW (ref 12.0–15.0)
MCH: 27 pg (ref 26.0–34.0)
MCHC: 30.3 g/dL (ref 30.0–36.0)
MCV: 89.2 fL (ref 80.0–100.0)
NRBC: 0 % (ref 0.0–0.2)
PLATELETS: 554 10*3/uL — AB (ref 150–400)
RBC: 3.78 MIL/uL — ABNORMAL LOW (ref 3.87–5.11)
RDW: 13 % (ref 11.5–15.5)
WBC: 11.2 10*3/uL — ABNORMAL HIGH (ref 4.0–10.5)

## 2018-05-23 LAB — POCT I-STAT TROPONIN I: Troponin i, poc: 0 ng/mL (ref 0.00–0.08)

## 2018-05-23 MED ORDER — SODIUM CHLORIDE (PF) 0.9 % IJ SOLN
INTRAMUSCULAR | Status: AC
  Filled 2018-05-23: qty 50

## 2018-05-23 MED ORDER — IOPAMIDOL (ISOVUE-370) INJECTION 76%
INTRAVENOUS | Status: AC
  Filled 2018-05-23: qty 100

## 2018-05-23 MED ORDER — IOPAMIDOL (ISOVUE-370) INJECTION 76%
100.0000 mL | Freq: Once | INTRAVENOUS | Status: AC | PRN
  Administered 2018-05-23: 100 mL via INTRAVENOUS

## 2018-05-23 MED ORDER — POTASSIUM CHLORIDE CRYS ER 20 MEQ PO TBCR
40.0000 meq | EXTENDED_RELEASE_TABLET | Freq: Once | ORAL | Status: AC
  Administered 2018-05-23: 40 meq via ORAL
  Filled 2018-05-23: qty 2

## 2018-05-23 NOTE — ED Notes (Signed)
Patient transported to X-ray 

## 2018-05-23 NOTE — ED Notes (Signed)
Pt is alert an dorinted x 4 and is verbally responsive. PT denies pain at this time and states " the tightness in my chest has gone away". Pt request mother to room,  Request complied.

## 2018-05-23 NOTE — H&P (Addendum)
Dawn Haynes MMH:[id number] DOB: 1981/05/24 DOA: 05/23/2018     PCP: [name], PA   Outpatient Specialists:   CARDS Dr. Percival Spanish    Oncology   Dr. [name]  Rad/Onc Tammi Klippel  Patient arrived to ER on 05/23/18 at 1547  Patient coming from: home Lives With family    Chief Complaint:  Chief Complaint  Patient presents with  . Chest Pain    HPI: Dawn Haynes is a 37 y.o. female with medical history significant of metastatic  Breast CA , Thoracic tumor removal     Presented with   history of persistent chest pain and discomfort chest tightness to the right side of the chest no cough or hemoptysis no fevers or chills she has been persistently tachycardic that she noticed looking at her apple watch.  She had a CAT scan done today while laying down became increasingly dyspneic and was sent to emergency department for further work-up. Progressive DOE over few week to the point when she gets short of breath talking. NO fever or chills.  She has LEft abdominal pain that has been persistent   Breast cancer sp Left mastectomy plan for radiation therapy tomorrow    While in ER: Which she has done today showed new large pericardial effusion discussed with cardiology who recommended transfer to [facility] for pericardiocentesis The following Work up has been ordered so far:  Orders Placed This Encounter  Procedures  . DG Chest 2 View  . CT Angio Chest PE W/Cm &/Or Wo Cm  . CT ABDOMEN PELVIS W CONTRAST  . Basic metabolic panel  . CBC  . Cardiac monitoring  . Saline Lock IV, Maintain IV access  . Inpatient consult to Cardiology  . Consult to hospitalist  . Pulse oximetry, continuous  . I-stat troponin, ED  . I-Stat beta hCG blood, ED  . POCT i-Stat troponin I  . I-Stat beta hCG blood, ED  . ED EKG within 10 minutes     Following Medications were ordered in ER: Medications  iopamidol (ISOVUE-370) 76 % injection (has no administration in time range)  sodium chloride  (PF) 0.9 % injection (has no administration in time range)  iopamidol (ISOVUE-370) 76 % injection 100 mL (100 mLs Intravenous Contrast Given 05/23/18 1807)    Significant initial  Findings: Abnormal Labs Reviewed  BASIC METABOLIC PANEL - Abnormal; Notable for the following components:      Result Value   Potassium 3.3 (*)    Chloride 94 (*)    All other components within normal limits  CBC - Abnormal; Notable for the following components:   WBC 11.2 (*)    RBC 3.78 (*)    Hemoglobin 10.2 (*)    HCT 33.7 (*)    Platelets 554 (*)    All other components within normal limits     Na 136 K 3.3  Cr   Stable  Lab Results  Component Value Date   CREATININE 0.68 05/23/2018   CREATININE 0.79 05/13/2018   CREATININE 0.88 04/27/2018        HG/HCT  stable,       Component Value Date/Time   HGB 10.2 (L) 05/23/2018 1614   HGB 11.4 (L) 05/13/2018 1356   HGB 11.7 01/05/2017 1023   HCT 33.7 (L) 05/23/2018 1614   HCT 35.7 01/05/2017 1023      Troponin (Point of Care Test) Recent Labs    05/23/18 1620  TROPIPOC 0.00       Lactic Acid,  Venous    Component Value Date/Time   LATICACIDVEN 4.3 (HH) 07/19/2015 1054      UA not ordered     CT chest  -new large pericardial effusion  ECG:  Personally reviewed by me showing: HR : 105 Rhythm:   Sinus tachycardia     no evidence of ischemic changes QTC 381    ED Triage Vitals  Enc Vitals Group     BP 05/23/18 1552 108/74     Pulse Rate 05/23/18 1552 (!) 107     Resp 05/23/18 1552 (!) 36     Temp 05/23/18 1552 99.5 F (37.5 C)     Temp Source 05/23/18 1552 Oral     SpO2 05/23/18 1552 100 %     Weight --      Height --      Head Circumference --      Peak Flow --      Pain Score 05/23/18 1553 10     Pain Loc --      Pain Edu? --      Excl. in [place]? --   TMAX(24)@       Latest  Blood pressure 132/84, pulse (!) 103, temperature 99.5 F (37.5 C), temperature source Oral, resp. rate 18, last menstrual period  05/11/2018, SpO2 99 %.    ER Provider Called:  Cardiology    Dr.Carnecelli They Recommend admit to medicine and transfer to [facility] Will see in AM   Hospitalist was called for admission for Pericardial effusion   Review of Systems:    Pertinent positives include:  chest pain, shortness of breath at rest.  dyspnea on exertion, constipation   Constitutional:  No weight loss, night sweats, Fevers, chills, fatigue, weight loss  HEENT:  No headaches, Difficulty swallowing,Tooth/dental problems,Sore throat,  No sneezing, itching, ear ache, nasal congestion, post nasal drip,  Cardio-vascular:  NoOrthopnea, PND, anasarca, dizziness, palpitations.no Bilateral lower extremity swelling  GI:  No heartburn, indigestion, abdominal pain, nausea, vomiting, diarrhea, change in bowel habits, loss of appetite, melena, blood in stool, hematemesis Resp:  no , No excess mucus, no productive cough, No non-productive cough, No coughing up of blood.No change in color of mucus.No wheezing. Skin:  no rash or lesions. No jaundice GU:  no dysuria, change in color of urine, no urgency or frequency. No straining to urinate.  No flank pain.  Musculoskeletal:  No joint pain or no joint swelling. No decreased range of motion. No back pain.  Psych:  No change in mood or affect. No depression or anxiety. No memory loss.  Neuro: no localizing neurological complaints, no tingling, no weakness, no double vision, no gait abnormality, no slurred speech, no confusion  All systems reviewed and apart from [place] all are negative  Past Medical History:   Past Medical History:  Diagnosis Date  . Breast cancer of upper-outer quadrant of left female breast ([place]) 07/11/2015  . DDD (degenerative disc disease), lumbar    L1-2  . Genetic testing 01/12/2017   Ms. Bastedo underwent genetic counseling and testing for hereditary cancer syndromes on 01/05/2017. Her results were negative for mutations in all 46 genes analyzed by [employer]'s  46-gene Common Hereditary Cancers Panel. Genes analyzed include: APC, ATM, AXIN2, BARD1, BMPR1A, BRCA1, BRCA2, BRIP1, CDH1, CDKN2A, CHEK2, CTNNA1, DICER1, EPCAM, GREM1, HOXB13, KIT, MEN1, MLH1, MSH2, MSH3, MSH6, MUTYH, NBN,   . Genetic testing of female    [employer] panel negative 01/2017  . History of blood transfusion    "when I lost  alot of blood when I went into premature labor"; no abnormal reaction noted      Past Surgical History:  Procedure Laterality Date  . BREAST BIOPSY Left 2018  . BREAST LUMPECTOMY WITH RADIOACTIVE SEED AND SENTINEL LYMPH NODE BIOPSY Left 10/28/2016   Procedure: LEFT BREAST LUMPECTOMY WITH BRACKETED RADIOACTIVE SEED AND SENTINEL LYMPH NODE BIOPSY;  Surgeon: [name], MD;  Location: [place];  Service: General;  Laterality: Left;  . BREAST RECONSTRUCTION WITH PLACEMENT OF TISSUE EXPANDER AND FLEX HD (ACELLULAR HYDRATED DERMIS) Left 12/23/2016  . BREAST RECONSTRUCTION WITH PLACEMENT OF TISSUE EXPANDER AND FLEX HD (ACELLULAR HYDRATED DERMIS) Left 12/23/2016   Procedure: IMMEDIATE LEFT BREAST RECONSTRUCTION WITH PLACEMENT OF TISSUE EXPANDER AND FLEX HD (ACELLULAR HYDRATED DERMIS);  Surgeon: Wallace Going, DO;  Location: [place];  Service: Plastics;  Laterality: Left;  . CESAREAN SECTION N/A 01/22/2013   Procedure: primary CESAREAN SECTION of baby boy at 2001;  Surgeon: Cheri Fowler, MD;  Location: [facility];  Service: Obstetrics;  Laterality: N/A;  . DILATION AND CURETTAGE OF UTERUS    . HERNIA REPAIR     UHR  . INDUCED ABORTION    . LAMINECTOMY N/A 04/28/2018   Procedure: THORACIC three corpectomy, excision of tumor, fusion and plating;  Surgeon: Newman Pies, MD;  Location: [place];  Service: Neurosurgery;  Laterality: N/A;  . LAPAROSCOPIC CHOLECYSTECTOMY  Aug 2000  . LIPOSUCTION WITH LIPOFILLING Left 11/10/2017   Procedure: FAT GRAFTING TO LEFT UPPER POLL CHEST;  Surgeon: Wallace Going, DO;  Location: [place];  Service: Plastics;   Laterality: Left;  Marland Kitchen MASTECTOMY Left 12/23/2016  . PARACENTESIS  07/17/2015   tube put in to drain fluid from near liver; US guided/notes 07/17/2015  . PLACEMENT OF BREAST IMPLANTS Right 11/10/2017   Procedure: PLACEMENT OF BREAST IMPLANT RIGHT;  Surgeon: Wallace Going, DO;  Location: [place];  Service: Plastics;  Laterality: Right;  . PORT-A-CATH REMOVAL Right 02/25/2017   Procedure: REMOVAL PORT-A-CATH;  Surgeon: Wallace Going, DO;  Location: [place];  Service: Plastics;  Laterality: Right;  . PORTACATH PLACEMENT Right 07/24/2015   Procedure: INSERTION PORT-A-CATH RIGHT SUBCLAVIAN;  Surgeon: [name], MD;  Location: [facility];  Service: General;  Laterality: Right;  . RE-EXCISION OF BREAST LUMPECTOMY Left 11/09/2016   Procedure: RE-EXCISION OF BREAST LUMPECTOMY;  Surgeon: [name], MD;  Location: [place];  Service: General;  Laterality: Left;  . REMOVAL OF TISSUE EXPANDER AND PLACEMENT OF IMPLANT Left 02/25/2017   Procedure: REMOVAL OF LEFT BREAST  TISSUE EXPANDER AND PLACEMENT OF LEFT SILICONE IMPLANT;  Surgeon: Wallace Going, DO;  Location: [place];  Service: Plastics;  Laterality: Left;  . STERNOTOMY N/A 04/28/2018   Procedure: STERNOTOMY;  Surgeon: Prescott Gum, Collier Salina, MD;  Location: [place];  Service: Open Heart Surgery;  Laterality: N/A;  . TOTAL MASTECTOMY Left 12/23/2016   Procedure: LEFT MASTECTOMY;  Surgeon: [name], MD;  Location: [place];  Service: General;  Laterality: Left;  . UMBILICAL HERNIA REPAIR  ~ 1983  . WISDOM TOOTH EXTRACTION  2002    Social History:  Ambulatory   independently prior to this   reports that she has never smoked. She has never used smokeless tobacco. She reports that she drinks alcohol. She reports that she does not use drugs.     Family History:   Family History  Problem Relation Age of Onset  . Hypertension Mother   . Diabetes  Maternal Grandmother      Allergies: No Known Allergies   Prior to Admission medications   Medication Sig Start Date End Date Taking? Authorizing Provider  ALPHA LIPOIC ACID PO Take 1 capsule by mouth daily.   Yes [provider]  cyclobenzaprine (FLEXERIL) 10 MG tablet Take 1 tablet (10 mg total) by mouth 3 (three) times daily as needed for muscle spasms. 04/30/18  Yes Judith Part, MD  docusate sodium (COLACE) 100 MG capsule Take 1 capsule (100 mg total) by mouth 2 (two) times daily. 04/30/18  Yes Judith Part, MD  metoprolol tartrate (LOPRESSOR) 25 MG tablet Take 1 tablet (25 mg total) by mouth 2 (two) times daily. 05/17/18  Yes Nicholas Lose, MD  Multiple Vitamin (MULTIVITAMIN) tablet Take 1 tablet by mouth daily.   Yes [provider]  oxyCODONE (OXY IR/ROXICODONE) 5 MG immediate release tablet Take 1 tablet (5 mg total) by mouth every 3 (three) hours as needed (pain). 04/30/18  Yes Judith Part, MD  TURMERIC PO Take 1 capsule by mouth daily.   Yes [provider]   Physical Exam: Blood pressure 132/84, pulse (!) 103, temperature 99.5 F (37.5 C), temperature source Oral, resp. rate 18, last menstrual period 05/11/2018, SpO2 99 %. 1. General:  in No Acute distress   Chronically ill cachectic -appearing 2. Psychological: Alert and  Oriented 3. Head/ENT:     Dry Mucous Membranes                          Head Non traumatic, neck supple                          Normal   Dentition 4. SKIN:   decreased Skin turgor,  Skin clean Dry and intact no rash 5. Heart: Regular rate and rhythm no  Murmur, no Rub or gallop 6. Lungs:  no wheezes or crackles   7. Abdomen: Soft,  non-tender, Non distended  bowel sounds present 8. Lower extremities: no clubbing, cyanosis, or  edema 9. Neurologically Grossly intact, moving all 4 extremities equally   10. MSK: Normal range of motion   LABS:     Recent Labs  Lab 05/23/18 1614  WBC 11.2*  HGB 10.2*  HCT 33.7*  MCV 89.2   PLT 962*   Basic Metabolic Panel: Recent Labs  Lab 05/23/18 1614  NA 136  K 3.3*  CL 94*  CO2 28  GLUCOSE 93  BUN 8  CREATININE 0.68  CALCIUM 9.2      No results for input(s): AST, ALT, ALKPHOS, BILITOT, PROT, ALBUMIN in the last 168 hours. No results for input(s): LIPASE, AMYLASE in the last 168 hours. No results for input(s): AMMONIA in the last 168 hours.    HbA1C: No results for input(s): HGBA1C in the last 72 hours. CBG: No results for input(s): GLUCAP in the last 168 hours.    Urine analysis:    Component Value Date/Time   COLORURINE YELLOW 07/17/2015 2009   APPEARANCEUR CLEAR 07/17/2015 2009   LABSPEC >1.046 (H) 07/17/2015 2009   PHURINE 6.0 07/17/2015 2009   GLUCOSEU NEGATIVE 07/17/2015 2009   HGBUR NEGATIVE 07/17/2015 2009   BILIRUBINUR SMALL (A) 07/17/2015 2009   KETONESUR 40 (A) 07/17/2015 2009   PROTEINUR NEGATIVE 07/17/2015 2009   UROBILINOGEN 0.2 01/26/2013 0704   NITRITE NEGATIVE 07/17/2015 2009   LEUKOCYTESUR NEGATIVE 07/17/2015 2009   Cultures:  Component Value Date/Time   SDES URINE, CLEAN CATCH 07/17/2015 2009   [place] NONE 07/17/2015 2009   CULT  07/17/2015 2009    NO GROWTH 1 DAY Performed at [facility] 07/19/2015 FINAL 07/17/2015 2009     Radiological Exams on Admission: Dg Chest 2 View  Result Date: 05/23/2018 CLINICAL DATA:  Status post thoracic spine surgery on April 28, 2018. Onset of chest pain today associated with shortness of breath history of previous left breast malignancy with mastectomy. Currently has tachycardia. EXAM: CHEST - 2 VIEW COMPARISON:  Portable chest x-ray of May 13, 2018 FINDINGS: The lungs are mildly hyperinflated. There is no pleural effusion or pneumothorax. The cardiac silhouette is enlarged. The pulmonary vascularity is normal. The sternal wires are intact. The patient has undergone an anterior fusion procedure in the upper thoracic spine. IMPRESSION: Mild  hyperinflation may be voluntary or may reflect underlying reactive airway disease. No evidence of pneumonia, CHF, nor metastatic disease. Electronically Signed   By: David  Martinique M.D.   On: 05/23/2018 16:34   Ct Angio Chest Pe W/cm &/or Wo Cm  Addendum Date: 05/23/2018   ADDENDUM REPORT: 05/23/2018 19:31 ADDENDUM: Addendum created to address the impression. IMPRESSION: The second finding should read: New large pericardial effusion.  Recommend correlation with ECHO. Electronically Signed   By: Corrie Mckusick D.O.   On: 05/23/2018 19:31   Result Date: 05/23/2018 CLINICAL DATA:  37 year old female with a history of chest pain and shortness of breath. History of breast carcinoma with prior treatment. EXAM: CT ANGIOGRAPHY CHEST CT ABDOMEN AND PELVIS WITH CONTRAST TECHNIQUE: Multidetector CT imaging of the chest was performed using the standard protocol during bolus administration of intravenous contrast. Multiplanar CT image reconstructions and MIPs were obtained to evaluate the vascular anatomy. Multidetector CT imaging of the abdomen and pelvis was performed using the standard protocol during bolus administration of intravenous contrast. CONTRAST:  170m ISOVUE-370 IOPAMIDOL (ISOVUE-370) INJECTION 76% COMPARISON:  CT abdomen 07/23/2017 MR 04/27/2018 FINDINGS: CTA CHEST FINDINGS Cardiovascular: Heart: New pericardial effusion. No significant calcifications of the valves or a coronary arteries. Surgical changes of median sternotomy. Aorta: Unremarkable course, caliber, contour of the thoracic aorta. No aneurysm or dissection flap. No periaortic fluid. Pulmonary arteries: No central, lobar, segmental, or proximal subsegmental filling defects. Mediastinum/Nodes: No large lymph nodes identified. Streak artifact somewhat limits evaluation. There are small lymph nodes in the axillary regions. Surgical changes of left axilla. Surgical changes of bilateral breast reconstruction/augmentation. Unremarkable thyroid.  Lungs/Pleura: No confluent airspace disease. No endotracheal or endobronchial debris. No pneumothorax. There are new pulmonary nodules compared to the prior CT. These include peripheral nodules of the bilateral upper lobes image 37, 38, 42 of series 6. Lingular nodule on image 78. Thickening of the interlobular septa of the right upper lobe, with no confluent airspace disease. Atelectasis at the right lung base. Musculoskeletal: Surgical changes of T2-T3, with T3 corpectomy. No acute fracture. Since the prior CT there have been interval development of a erosive bony changes at the posterior aspect of the left third rib, fourth rib, left posterior elements of T3 and T4. Review of the MIP images confirms the above findings. CT ABDOMEN and PELVIS FINDINGS Hepatobiliary: Compared to the prior CT of January, there has been near replacement of the liver parenchyma with metastatic disease. Surgical changes of cholecystectomy. Pancreas: Unremarkable pancreas Spleen: Unremarkable spleen Adrenals/Urinary Tract: Unremarkable adrenal glands. Stomach/Bowel: Unremarkable stomach. Unremarkable small bowel. No abnormal distention. No transition point. Unremarkable colon with  moderate to large stool burden. Vascular/Lymphatic: Unremarkable aorta. Proximal femoral arteries remain patent. Engorged pelvic veins. 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 Left gonadal vein remains patent. Unremarkable IVC. Unremarkable appearance of the iliac veins. Reproductive: Unremarkable uterus.  Unremarkable adnexa. Other: Low-density ascites within the pelvis. No retroperitoneal or pelvic adenopathy. Musculoskeletal: No acute displaced fracture. No aggressive bony lesions of the spine and pelvis. Review of the MIP images confirms the above findings. IMPRESSION: CT is negative for pulmonary embolism. New large pleural effusion.  Recommend correlation with ECHO. Compared to the prior CT of January, there has been significant  progression of metastatic disease, with now near replacement of liver parenchyma, new pulmonary nodules, and new erosive bony changes of T2, T3, and the left second and third ribs. 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 Referral for oncologic follow-up recommended. Electronically Signed: By: Corrie Mckusick D.O. On: 05/23/2018 19:01   Ct Abdomen Pelvis W Contrast  Addendum Date: 05/23/2018   ADDENDUM REPORT: 05/23/2018 19:31 ADDENDUM: Addendum created to address the impression. IMPRESSION: The second finding should read: New large pericardial effusion.  Recommend correlation with ECHO. Electronically Signed   By: Corrie Mckusick D.O.   On: 05/23/2018 19:31   Result Date: 05/23/2018 CLINICAL DATA:  37 year old female with a history of chest pain and shortness of breath. History of breast carcinoma with prior treatment. EXAM: CT ANGIOGRAPHY CHEST CT ABDOMEN AND PELVIS WITH CONTRAST TECHNIQUE: Multidetector CT imaging of the chest was performed using the standard protocol during bolus administration of intravenous contrast. Multiplanar CT image reconstructions and MIPs were obtained to evaluate the vascular anatomy. Multidetector CT imaging of the abdomen and pelvis was performed using the standard protocol during bolus administration of intravenous contrast. CONTRAST:  112m ISOVUE-370 IOPAMIDOL (ISOVUE-370) INJECTION 76% COMPARISON:  CT abdomen 07/23/2017 MR 04/27/2018 FINDINGS: CTA CHEST FINDINGS Cardiovascular: Heart: New pericardial effusion. No significant calcifications of the valves or a coronary arteries. Surgical changes of median sternotomy. Aorta: Unremarkable course, caliber, contour of the thoracic aorta. No aneurysm or dissection flap. No periaortic fluid. Pulmonary arteries: No central, lobar, segmental, or proximal subsegmental filling defects. Mediastinum/Nodes: No large lymph nodes identified. Streak artifact somewhat limits evaluation. There are small  lymph nodes in the axillary regions. Surgical changes of left axilla. Surgical changes of bilateral breast reconstruction/augmentation. Unremarkable thyroid. Lungs/Pleura: No confluent airspace disease. No endotracheal or endobronchial debris. No pneumothorax. There are new pulmonary nodules compared to the prior CT. These include peripheral nodules of the bilateral upper lobes image 37, 38, 42 of series 6. Lingular nodule on image 78. Thickening of the interlobular septa of the right upper lobe, with no confluent airspace disease. Atelectasis at the right lung base. Musculoskeletal: Surgical changes of T2-T3, with T3 corpectomy. No acute fracture. Since the prior CT there have been interval development of a erosive bony changes at the posterior aspect of the left third rib, fourth rib, left posterior elements of T3 and T4. Review of the MIP images confirms the above findings. CT ABDOMEN and PELVIS FINDINGS Hepatobiliary: Compared to the prior CT of January, there has been near replacement of the liver parenchyma with metastatic disease. Surgical changes of cholecystectomy. Pancreas: Unremarkable pancreas Spleen: Unremarkable spleen Adrenals/Urinary Tract: Unremarkable adrenal glands. Stomach/Bowel: Unremarkable stomach. Unremarkable small bowel. No abnormal distention. No transition point. Unremarkable colon with moderate to large stool burden. Vascular/Lymphatic: Unremarkable aorta. Proximal femoral arteries remain patent. Engorged pelvic veins. There appears to be thrombosis of the right  gonadal vein. Left gonadal vein remains patent. Unremarkable IVC. Unremarkable appearance of the iliac veins. Reproductive: Unremarkable uterus.  Unremarkable adnexa. Other: Low-density ascites within the pelvis. No retroperitoneal or pelvic adenopathy. Musculoskeletal: No acute displaced fracture. No aggressive bony lesions of the spine and pelvis. Review of the MIP images confirms the above findings. IMPRESSION: CT is negative  for pulmonary embolism. New large pleural effusion.  Recommend correlation with ECHO. Compared to the prior CT of January, there has been significant progression of metastatic disease, with now near replacement of liver parenchyma, new pulmonary nodules, and new erosive bony changes of T2, T3, and the left second and third ribs. 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 Referral for oncologic follow-up recommended. Electronically Signed: By: Corrie Mckusick D.O. On: 05/23/2018 19:01    Chart has been reviewed    Assessment/Plan  37 y.o. female with medical history significant of metastatic  Breast CA , Thoracic tumor removal  Admitted for pericardial effusion requiring pericardiocentesis  Present on Admission: . Pericardial effusion -plan to transfer to [facility] stepdown bed when bed becomes available plan for paracentesis tomorrow morning continue to monitor overnight . Breast cancer of upper-outer quadrant of left female breast [facility]) to follow-up with oncology will notify oncology patient has been admitted . SOB (shortness of breath) in the setting of large pericardial effusion appreciate cardiology help . 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 Hypokalemia we will replace and check magnesium level  Of hypertension holding metoprolol given pericardial effusion Other plan as per orders.  DVT prophylaxis:  SCD    Code Status:  FULL CODE   as per patient  I had personally discussed CODE STATUS with patient   Family Communication:   Family not  at  Bedside    Disposition Plan:     To home once workup is complete and patient is stable                    Would benefit from PT/OT eval prior to DC  Ordered                   Consults called: Cardiology   Admission status:  inpatient     Expect 2 midnight stay secondary to severity of patient's current illness including hemodynamic instability despite optimal treatment  (tachycardia tachypnea)  Severe radiological  abnormalities including pericardial effusion and extensive comorbidities including:   malignancy,   That are currently affecting medical management.  I expect  patient to be hospitalized for 2 midnights requiring inpatient medical care.  Patient is at high risk for adverse outcome (such as loss of life or disability) if not treated.  Indication for inpatient stay as follows:  Hemodynamic instability despite maximal medical therapy,      inability to maintain oral hydration   persistent chest pain despite medical management Need for procedural  intervention      Level of care       SDU tele indefinitely please discontinue once patient no longer qualifies       Shabre Kreher 05/23/2018, 11:53 PM    [facility]  Pager [phone]   after 2 AM please page floor coverage PA If 7AM-7PM, please contact the day team taking care of the patient  [email or web address]  Password [email or web address]

## 2018-05-23 NOTE — ED Provider Notes (Signed)
[facility] DEPT Provider Note   CSN: [id number] Arrival date & time: 05/23/18  1547     History   Chief Complaint Chief Complaint  Patient presents with  . Chest Pain    HPI Dawn Haynes is a 37 y.o. female.  37 year old female with history of spinal tumor who presents with several week history of right-sided chest discomfort characterizes tightness and feels like she just cannot have adequate air exchange.  Denies any cough or hemoptysis.  No fever or chills.  Symptoms are worse with exertion and better with rest.  They do not appear to be anginal.  Patient scheduled to have a chest CT today but became increasingly dyspneic and was sent here for further management.  Patient is to start radiation treatment this week     Past Medical History:  Diagnosis Date  . Breast cancer of upper-outer quadrant of left female breast ([place]) 07/11/2015  . DDD (degenerative disc disease), lumbar    L1-2  . Genetic testing 01/12/2017   Ms. Tiberio underwent genetic counseling and testing for hereditary cancer syndromes on 01/05/2017. Her results were negative for mutations in all 46 genes analyzed by [employer]'s 46-gene Common Hereditary Cancers Panel. Genes analyzed include: APC, ATM, AXIN2, BARD1, BMPR1A, BRCA1, BRCA2, BRIP1, CDH1, CDKN2A, CHEK2, CTNNA1, DICER1, EPCAM, GREM1, HOXB13, KIT, MEN1, MLH1, MSH2, MSH3, MSH6, MUTYH, NBN,   . Genetic testing of female    [employer] panel negative 01/2017  . History of blood transfusion    "when I lost alot of blood when I went into premature labor"; no abnormal reaction noted    Patient Active Problem List   Diagnosis Date Noted  . Pathologic fracture of thoracic vertebrae 04/28/2018  . 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 Genetic testing 01/12/2017  . Breast cancer ([place]) 12/24/2016  . Acquired absence of breast and absent nipple, left 12/23/2016  . Hyponatremia 07/29/2015  .  Hyperkalemia 07/29/2015  . Transaminitis 07/29/2015  . Ascites, malignant   . Malignant ascites   . Port catheter in place   . Metastasis to liver ([place]) 07/24/2015  . Ascites 07/17/2015  . SBP (spontaneous bacterial peritonitis) ([place]) 07/17/2015  . Abdominal pain 07/17/2015  . SOB (shortness of breath) 07/17/2015  . DDD (degenerative disc disease), lumbar 07/17/2015  . Severe sepsis ([place])   . Breast cancer of upper-outer quadrant of left female breast ([place]) 07/11/2015    Past Surgical History:  Procedure Laterality Date  . BREAST BIOPSY Left 2018  . BREAST LUMPECTOMY WITH RADIOACTIVE SEED AND SENTINEL LYMPH NODE BIOPSY Left 10/28/2016   Procedure: LEFT BREAST LUMPECTOMY WITH BRACKETED RADIOACTIVE SEED AND SENTINEL LYMPH NODE BIOPSY;  Surgeon: [name], MD;  Location: [facility];  Service: General;  Laterality: Left;  . BREAST RECONSTRUCTION WITH PLACEMENT OF TISSUE EXPANDER AND FLEX HD (ACELLULAR HYDRATED DERMIS) Left 12/23/2016  . BREAST RECONSTRUCTION WITH PLACEMENT OF TISSUE EXPANDER AND FLEX HD (ACELLULAR HYDRATED DERMIS) Left 12/23/2016   Procedure: IMMEDIATE LEFT BREAST RECONSTRUCTION WITH PLACEMENT OF TISSUE EXPANDER AND FLEX HD (ACELLULAR HYDRATED DERMIS);  Surgeon: Wallace Going, DO;  Location: [facility];  Service: Plastics;  Laterality: Left;  . CESAREAN SECTION N/A 01/22/2013   Procedure: primary CESAREAN SECTION of baby boy at 2001;  Surgeon: Cheri Fowler, MD;  Location: [facility];  Service: Obstetrics;  Laterality: N/A;  . DILATION AND CURETTAGE OF UTERUS    . HERNIA REPAIR     UHR  . INDUCED ABORTION    .  LAMINECTOMY N/A 04/28/2018   Procedure: THORACIC three corpectomy, excision of tumor, fusion and plating;  Surgeon: Newman Pies, MD;  Location: [facility];  Service: Neurosurgery;  Laterality: N/A;  . LAPAROSCOPIC CHOLECYSTECTOMY  Aug 2000  . LIPOSUCTION WITH LIPOFILLING Left 11/10/2017   Procedure: FAT GRAFTING TO LEFT UPPER POLL CHEST;  Surgeon: Wallace Going, DO;   Location: [facility];  Service: Plastics;  Laterality: Left;  Marland Kitchen MASTECTOMY Left 12/23/2016  . PARACENTESIS  07/17/2015   tube put in to drain fluid from near liver; US guided/notes 07/17/2015  . PLACEMENT OF BREAST IMPLANTS Right 11/10/2017   Procedure: PLACEMENT OF BREAST IMPLANT RIGHT;  Surgeon: Wallace Going, DO;  Location: [facility];  Service: Plastics;  Laterality: Right;  . PORT-A-CATH REMOVAL Right 02/25/2017   Procedure: REMOVAL PORT-A-CATH;  Surgeon: Wallace Going, DO;  Location: [facility];  Service: Plastics;  Laterality: Right;  . PORTACATH PLACEMENT Right 07/24/2015   Procedure: INSERTION PORT-A-CATH RIGHT SUBCLAVIAN;  Surgeon: [name], MD;  Location: [facility];  Service: General;  Laterality: Right;  . RE-EXCISION OF BREAST LUMPECTOMY Left 11/09/2016   Procedure: RE-EXCISION OF BREAST LUMPECTOMY;  Surgeon: [name], MD;  Location: [facility];  Service: General;  Laterality: Left;  . REMOVAL OF TISSUE EXPANDER AND PLACEMENT OF IMPLANT Left 02/25/2017   Procedure: REMOVAL OF LEFT BREAST  TISSUE EXPANDER AND PLACEMENT OF LEFT SILICONE IMPLANT;  Surgeon: Wallace Going, DO;  Location: [facility];  Service: Plastics;  Laterality: Left;  . STERNOTOMY N/A 04/28/2018   Procedure: STERNOTOMY;  Surgeon: Prescott Gum, Collier Salina, MD;  Location: [facility];  Service: Open Heart Surgery;  Laterality: N/A;  . TOTAL MASTECTOMY Left 12/23/2016   Procedure: LEFT MASTECTOMY;  Surgeon: [name], MD;  Location: [facility];  Service: General;  Laterality: Left;  . UMBILICAL HERNIA REPAIR  ~ 1983  . WISDOM TOOTH EXTRACTION  2002     OB History    Gravida  15   Para  7   Term  4   Preterm  3   AB  7   Living  6     SAB  6   TAB  1   Ectopic      Multiple      Live Births  7            Home Medications    Prior to Admission medications   Medication Sig Start Date End Date Taking?  Authorizing Provider  ALPHA LIPOIC ACID PO Take 1 capsule by mouth daily.    [provider]  cyclobenzaprine (FLEXERIL) 10 MG tablet Take 1 tablet (10 mg total) by mouth 3 (three) times daily as needed for muscle spasms. 04/30/18   Judith Part, MD  docusate sodium (COLACE) 100 MG capsule Take 1 capsule (100 mg total) by mouth 2 (two) times daily. 04/30/18   Judith Part, MD  metoprolol tartrate (LOPRESSOR) 25 MG tablet Take 1 tablet (25 mg total) by mouth 2 (two) times daily. 05/17/18   Nicholas Lose, MD  Multiple Vitamin (MULTIVITAMIN) tablet Take 1 tablet by mouth daily.    [provider]  oxyCODONE (OXY IR/ROXICODONE) 5 MG immediate release tablet Take 1 tablet (5 mg total) by mouth every 3 (three) hours as needed (pain). 04/30/18   Judith Part, MD  TURMERIC PO Take 1 capsule by mouth daily.    [provider]    Family History Family  History  Problem Relation Age of Onset  . Hypertension Mother   . Diabetes Maternal Grandmother     Social History Social History   Tobacco Use  . Smoking status: Never Smoker  . Smokeless tobacco: Never Used  Substance Use Topics  . Alcohol use: Yes    Comment: 12/23/2016 "might have a drink a few times/year"  . Drug use: No     Allergies   Patient has no known allergies.   Review of Systems Review of Systems  All other systems reviewed and are negative.    Physical Exam Updated Vital Signs BP 108/74 (BP Location: Right Arm)   Pulse (!) 107   Temp 99.5 F (37.5 C) (Oral)   Resp (!) 36   LMP 05/11/2018 (Approximate)   SpO2 100%   Physical Exam  Constitutional: She is oriented to person, place, and time. She appears well-developed and well-nourished.  Non-toxic appearance. No distress.  HENT:  Head: Normocephalic and atraumatic.  Eyes: Pupils are equal, round, and reactive to light. Conjunctivae, EOM and lids are normal.  Neck: Normal range of motion. Neck supple. No tracheal  deviation present. No thyroid mass present.  Cardiovascular: Normal rate, regular rhythm and normal heart sounds. Exam reveals no gallop.  No murmur heard. Pulmonary/Chest: Effort normal and breath sounds normal. No stridor. No respiratory distress. She has no decreased breath sounds. She has no wheezes. She has no rhonchi. She has no rales.  Abdominal: Soft. Normal appearance and bowel sounds are normal. She exhibits no distension. There is no tenderness. There is no rebound and no CVA tenderness.  Musculoskeletal: Normal range of motion. She exhibits no edema or tenderness.  Neurological: She is alert and oriented to person, place, and time. She has normal strength. No cranial nerve deficit or sensory deficit. GCS eye subscore is 4. GCS verbal subscore is 5. GCS motor subscore is 6.  Skin: Skin is warm and dry. No abrasion and no rash noted.  Psychiatric: She has a normal mood and affect. Her speech is normal and behavior is normal.  Nursing note and vitals reviewed.    ED Treatments / Results  Labs (all labs ordered are listed, but only abnormal results are displayed) Labs Reviewed  CBC - Abnormal; Notable for the following components:      Result Value   WBC 11.2 (*)    RBC 3.78 (*)    Hemoglobin 10.2 (*)    HCT 33.7 (*)    Platelets 554 (*)    All other components within normal limits  BASIC METABOLIC PANEL  I-STAT TROPONIN, ED  I-STAT BETA HCG BLOOD, ED ([facility], [facility], [facility] ONLY)  POCT I-STAT TROPONIN I  I-STAT BETA HCG BLOOD, ED (NOT ORDERABLE)    EKG EKG Interpretation  Date/Time:  Monday May 23 2018 16:01:47 EST Ventricular Rate:  105 PR Interval:    QRS Duration: 74 QT Interval:  288 QTC Calculation: 381 R Axis:   81 Text Interpretation:  Sinus tachycardia Low voltage, precordial leads Borderline T abnormalities, diffuse leads Baseline wander in lead(s) I III aVL Confirmed by Lacretia Leigh ([id number]) on 05/23/2018 4:32:03 PM   Radiology Dg Chest 2 View  Result  Date: 05/23/2018 CLINICAL DATA:  Status post thoracic spine surgery on April 28, 2018. Onset of chest pain today associated with shortness of breath history of previous left breast malignancy with mastectomy. Currently has tachycardia. EXAM: CHEST - 2 VIEW COMPARISON:  Portable chest x-ray of May 13, 2018 FINDINGS: The lungs are mildly  hyperinflated. There is no pleural effusion or pneumothorax. The cardiac silhouette is enlarged. The pulmonary vascularity is normal. The sternal wires are intact. The patient has undergone an anterior fusion procedure in the upper thoracic spine. IMPRESSION: Mild hyperinflation may be voluntary or may reflect underlying reactive airway disease. No evidence of pneumonia, CHF, nor metastatic disease. Electronically Signed   By: David  Martinique M.D.   On: 05/23/2018 16:34    Procedures Procedures (including critical care time)  Medications Ordered in ED Medications - No data to display   Initial Impression / Assessment and Plan / ED Course  I have reviewed the triage vital signs and the nursing notes.  Pertinent labs & imaging results that were available during my care of the patient were reviewed by me and considered in my medical decision making (see chart for details).     Patient had a CT of her chest which showed a new large pericardial effusion.  The patient's heart rate and blood pressure stable.  Discussed with cardiology who wants patient over to [facility] and they will take patient to the catheterization lab tomorrow for pericardiocentesis.  Final Clinical Impressions(s) / ED Diagnoses   Final diagnoses:  None    ED Discharge Orders    None       Lacretia Leigh, MD 05/23/18 2020

## 2018-05-23 NOTE — ED Notes (Signed)
ED Provider at bedside. Dawn Haynes 

## 2018-05-23 NOTE — ED Triage Notes (Addendum)
Pt to ED from CT with nurse with c/o of Chest pain. Pt started to have chest pain on her way to the hospital for CT for her chest that was ordered from her PCP. Pt has surgery 10/18 to remove a tumor in her thoracic area. Pt is A&O x 4. Pt c/o of SOB and is ST

## 2018-05-24 ENCOUNTER — Inpatient Hospital Stay (HOSPITAL_COMMUNITY): Payer: Medicaid Other

## 2018-05-24 ENCOUNTER — Encounter (HOSPITAL_COMMUNITY): Payer: Self-pay

## 2018-05-24 ENCOUNTER — Ambulatory Visit: Payer: Medicaid Other | Admitting: Radiation Oncology

## 2018-05-24 DIAGNOSIS — R0602 Shortness of breath: Secondary | ICD-10-CM

## 2018-05-24 DIAGNOSIS — I313 Pericardial effusion (noninflammatory): Secondary | ICD-10-CM

## 2018-05-24 DIAGNOSIS — Z171 Estrogen receptor negative status [ER-]: Secondary | ICD-10-CM

## 2018-05-24 DIAGNOSIS — R079 Chest pain, unspecified: Secondary | ICD-10-CM

## 2018-05-24 DIAGNOSIS — C50412 Malignant neoplasm of upper-outer quadrant of left female breast: Secondary | ICD-10-CM

## 2018-05-24 LAB — TYPE AND SCREEN
ABO/RH(D): A POS
Antibody Screen: NEGATIVE

## 2018-05-24 LAB — URINALYSIS, ROUTINE W REFLEX MICROSCOPIC
Bacteria, UA: NONE SEEN
Bilirubin Urine: NEGATIVE
Glucose, UA: NEGATIVE mg/dL
Ketones, ur: 5 mg/dL — AB
Leukocytes, UA: NEGATIVE
Nitrite: NEGATIVE
Protein, ur: NEGATIVE mg/dL
Specific Gravity, Urine: 1.008 (ref 1.005–1.030)
pH: 7 (ref 5.0–8.0)

## 2018-05-24 LAB — MAGNESIUM
Magnesium: 1.8 mg/dL (ref 1.7–2.4)
Magnesium: 1.7 mg/dL (ref 1.7–2.4)

## 2018-05-24 LAB — COMPREHENSIVE METABOLIC PANEL
ALK PHOS: 188 U/L — AB (ref 38–126)
ALT: 53 U/L — AB (ref 0–44)
AST: 123 U/L — ABNORMAL HIGH (ref 15–41)
Albumin: 2.3 g/dL — ABNORMAL LOW (ref 3.5–5.0)
Anion gap: 9 (ref 5–15)
BUN: 6 mg/dL (ref 6–20)
CALCIUM: 8.3 mg/dL — AB (ref 8.9–10.3)
CHLORIDE: 97 mmol/L — AB (ref 98–111)
CO2: 25 mmol/L (ref 22–32)
CREATININE: 0.75 mg/dL (ref 0.44–1.00)
GFR calc Af Amer: >60 mL/min (ref >60)
Glucose, Bld: 96 mg/dL (ref 70–99)
Potassium: 3.9 mmol/L (ref 3.5–5.1)
Sodium: 131 mmol/L — ABNORMAL LOW (ref 135–145)
TOTAL PROTEIN: 6.6 g/dL (ref 6.5–8.1)
Total Bilirubin: 0.7 mg/dL (ref 0.3–1.2)

## 2018-05-24 LAB — CBC
HCT: 28.1 % — ABNORMAL LOW (ref 36.0–46.0)
HEMOGLOBIN: 8.4 g/dL — AB (ref 12.0–15.0)
MCH: 26.3 pg (ref 26.0–34.0)
MCHC: 29.9 g/dL — ABNORMAL LOW (ref 30.0–36.0)
MCV: 87.8 fL (ref 80.0–100.0)
NRBC: 0 % (ref 0.0–0.2)
PLATELETS: 442 10*3/uL — AB (ref 150–400)
RBC: 3.2 MIL/uL — AB (ref 3.87–5.11)
RDW: 13 % (ref 11.5–15.5)
WBC: 11.8 10*3/uL — AB (ref 4.0–10.5)

## 2018-05-24 LAB — TSH: TSH: 2.192 u[IU]/mL (ref 0.350–4.500)

## 2018-05-24 LAB — ECHOCARDIOGRAM COMPLETE
Height: 63 in
WEIGHTICAEL: 1943.58 [oz_av]

## 2018-05-24 LAB — PHOSPHORUS: Phosphorus: 3.6 mg/dL (ref 2.5–4.6)

## 2018-05-24 LAB — APTT: aPTT: 36 seconds (ref 24–36)

## 2018-05-24 MED ORDER — ACETAMINOPHEN 650 MG RE SUPP: 650.0000 mg | Freq: Four times a day (QID) | RECTAL | Status: DC | PRN

## 2018-05-24 MED ORDER — SODIUM CHLORIDE 0.9% FLUSH: 3.0000 mL | INTRAVENOUS | Status: DC | PRN

## 2018-05-24 MED ORDER — SODIUM CHLORIDE 0.9% FLUSH
3.0000 mL | Freq: Two times a day (BID) | INTRAVENOUS | Status: DC
  Administered 2018-05-24 – 2018-05-29 (×8): 3 mL via INTRAVENOUS

## 2018-05-24 MED ORDER — ONDANSETRON HCL 4 MG PO TABS: 4.0000 mg | ORAL_TABLET | Freq: Four times a day (QID) | ORAL | Status: DC | PRN

## 2018-05-24 MED ORDER — SODIUM CHLORIDE 0.9 % IV SOLN
INTRAVENOUS | Status: AC
  Administered 2018-05-24: 02:00:00 via INTRAVENOUS

## 2018-05-24 MED ORDER — ACETAMINOPHEN 325 MG PO TABS
650.0000 mg | ORAL_TABLET | Freq: Four times a day (QID) | ORAL | Status: DC | PRN
  Administered 2018-05-25 – 2018-05-26 (×3): 650 mg via ORAL
  Filled 2018-05-24 (×3): qty 2

## 2018-05-24 MED ORDER — POLYETHYLENE GLYCOL 3350 17 G PO PACK
17.0000 g | PACK | Freq: Every day | ORAL | Status: DC
  Administered 2018-05-25 – 2018-05-29 (×4): 17 g via ORAL
  Filled 2018-05-24 (×5): qty 1

## 2018-05-24 MED ORDER — PNEUMOCOCCAL VAC POLYVALENT 25 MCG/0.5ML IJ INJ
0.5000 mL | INJECTION | INTRAMUSCULAR | Status: AC
  Administered 2018-05-25: 0.5 mL via INTRAMUSCULAR
  Filled 2018-05-24: qty 0.5

## 2018-05-24 MED ORDER — BISACODYL 5 MG PO TBEC
10.0000 mg | DELAYED_RELEASE_TABLET | Freq: Once | ORAL | Status: AC
  Administered 2018-05-24: 10 mg via ORAL
  Filled 2018-05-24: qty 2

## 2018-05-24 MED ORDER — INFLUENZA VAC SPLIT QUAD 0.5 ML IM SUSY
0.5000 mL | PREFILLED_SYRINGE | INTRAMUSCULAR | Status: AC
  Administered 2018-05-25: 0.5 mL via INTRAMUSCULAR
  Filled 2018-05-24: qty 0.5

## 2018-05-24 MED ORDER — ONDANSETRON HCL 4 MG/2ML IJ SOLN: 4.0000 mg | Freq: Four times a day (QID) | INTRAMUSCULAR | Status: DC | PRN

## 2018-05-24 MED ORDER — HYDROCODONE-ACETAMINOPHEN 5-325 MG PO TABS
1.0000 | ORAL_TABLET | ORAL | Status: DC | PRN
  Administered 2018-05-24: 1 via ORAL
  Filled 2018-05-24: qty 2

## 2018-05-24 MED ORDER — OXYCODONE HCL 5 MG PO TABS
5.0000 mg | ORAL_TABLET | ORAL | Status: DC | PRN
  Administered 2018-05-25 – 2018-05-30 (×8): 5 mg via ORAL
  Filled 2018-05-24 (×8): qty 1

## 2018-05-24 MED ORDER — SODIUM CHLORIDE 0.9 % IV SOLN: 250.0000 mL | INTRAVENOUS | Status: DC | PRN

## 2018-05-24 NOTE — Progress Notes (Signed)
  Subjective: Patient examined, CT scan of chest and echocardiogram images of heart personally reviewed. Unfortunate 37 year old with widely metastatic carcinoma the breast and a new large pericardial effusion with associated shortness of breath.  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  The liver has been replaced with cancer.  There are metastatic lesions in multiple ribs.  The cancer in her liver is a large firm mass in the upper abdomen which is directly in the area of a incision for pericardial window. Because the patient's cancer so advanced she would be very poor candidate for general anesthesia and formal pericardial window.  Would recommend percutaneous drain placed by interventional cardiology.  This would help relieve her symptoms and allow end-of-life discussions and palliative care.  Objective: Vital signs in last 24 hours: Temp:  [99.2 F (37.3 C)-99.9 F (37.7 C)] 99.5 F (37.5 C) (11/12 0802) Pulse Rate:  [93-106] 103 (11/12 1300) Cardiac Rhythm: Normal sinus rhythm (11/12 1500) Resp:  [15-30] 19 (11/12 0802) BP: (95-157)/(66-94) 112/74 (11/12 0802) SpO2:  [97 %-100 %] 100 % (11/12 0802) Weight:  [55.1 kg] 55.1 kg (11/12 0146)  Hemodynamic parameters for last 24 hours:  Sinus tachycardia Saturation 98% on room air Intake/Output from previous day: 11/11 0701 - 11/12 0700 In: 156 [I.V.:156] Out: 650 [Urine:650] Intake/Output this shift: Total I/O In: 400 [I.V.:400] Out: 700 [Urine:700]  Blood pressure 112/74, pulse (!) 103, temperature 99.5 F (37.5 C), temperature source Oral, resp. rate 19, height 5\' 3"  (1.6 m), weight 55.1 kg, last menstrual period 05/11/2018, SpO2 100 %. Diminished heart sounds Large firm tender epigastric mass from metastatic cancer replacing the liver.  Lab Results: Recent Labs    05/23/18 1614 05/24/18 0313  WBC 11.2* 11.8*  HGB 10.2* 8.4*  HCT 33.7*  28.1*  PLT 554* 442*   BMET:  Recent Labs    05/23/18 1614 05/24/18 0313  NA 136 131*  K 3.3* 3.9  CL 94* 97*  CO2 28 25  GLUCOSE 93 96  BUN 8 6  CREATININE 0.68 0.75  CALCIUM 9.2 8.3*    PT/INR: No results for input(s): LABPROT, INR in the last 72 hours. ABG No results found for: PHART, HCO3, TCO2, ACIDBASEDEF, O2SAT CBG (last 3)  No results for input(s): GLUCAP in the last 72 hours.  Assessment/Plan: Patient would not tolerate general anesthesia and major surgery for pericardial window.  Would recommend percutaneous pigtail drainage by interventional cardiology for relief of symptoms.  Her expected survival is very limited by the widespread tumor.   LOS: 1 day    Tharon Aquas Trigt III 05/24/2018

## 2018-05-24 NOTE — Consult Note (Signed)
Cardiology Consultation:   Patient ID: Dawn Haynes; [id number]; 08-04-1980   Admit date: 05/23/2018 Date of Consult: 05/24/2018  Primary Care Provider: [name], PA Primary Cardiologist: Dr. Minus Breeding, MD  Patient Profile:   Dawn Haynes is a 37 y.o. female with a hx of metastatic breast cancer s/p left mastectomy treated with chemotherapy (plans to start radiation therapy) and recent thoracic tumor removal who is being seen today for the evaluation of chest pain at the request of Dr. Roel Cluck.  History of Present Illness:   Dawn Haynes is a 37 year old female with a history stated above who initially presented to [facility] on 05/23/2018 with complaints of mid-sternal, non radiating chest pain which began soon after her thoracic surgery 04/28/18. She states that since that time, she has been experiencing chest tightness, SOB and abdominal fullness. She states that the chest tightness would worsen with exertion and relieved with rest. She has no prior hx of CAD. She has been persistently tachycardic and was referred to Cardiology during this time. She denies orthopnea symptoms, LE swelling, dizziness, recent cough or hemoptysis. She denies fevers, chills, however reports abdominal pain and early satiety.   In the ED, she was noted to have mild hyperinflation possibly reflecting reactive airway disease with no evidence of pneumonia, CHF or metastatic disease on CXR.  Follow-up CT angio of the  chest with new large pericardial effusion noted. Given her recent abdominal pain and distention and thoracic tumor removal a CT abdomen and pelvis was completed which showed progressive metastatic disease with near replacement of liver parenchyma, new pulmonary nodules and new erosive bony changes of T2, T3 and left second and third ribs when compared to prior CT in January 2019.  I-STAT troponin was negative at 0.00.  Creatinine stable at 0.75.  Magnesium low normal at 1.7.  Liver enzymes were  noted to be elevated.  Admitting team discussed with cardiology for transfer to [facility] for possible paracentesis. She was noted to be hemodynamically stable with no evidence of tamponade.   She was last seen by her primary cardiologist as a referral from her PCP for the evaluation of tachycardia on 05/19/2018.  Of note, she initially was doing very well with chemotherapy for metastatic CA unfortunately, she began having abdominal pain and distention.  An ultrasound was completed that did not demonstrate ascites however a lesion was recently resected from her thoracic spine with probable metastasis and she is undergoing further imaging plans for further therapy.  She has been noted to be tachycardic, thought to be related to pain and deconditioning.  Upon exam during this visit, there was no pulses paradox or neck vein elevation.  She was started on a small dose of beta-blocker with plans to follow-up on an as-needed basis.  Past Medical History:  Diagnosis Date  . Breast cancer of upper-outer quadrant of left female breast ([place]) 07/11/2015  . DDD (degenerative disc disease), lumbar    L1-2  . Genetic testing 01/12/2017   Ms. Maille underwent genetic counseling and testing for hereditary cancer syndromes on 01/05/2017. Her results were negative for mutations in all 46 genes analyzed by [employer]'s 46-gene Common Hereditary Cancers Panel. Genes analyzed include: APC, ATM, AXIN2, BARD1, BMPR1A, BRCA1, BRCA2, BRIP1, CDH1, CDKN2A, CHEK2, CTNNA1, DICER1, EPCAM, GREM1, HOXB13, KIT, MEN1, MLH1, MSH2, MSH3, MSH6, MUTYH, NBN,   . Genetic testing of female    [employer] panel negative 01/2017  . History of blood transfusion    "when I lost alot of blood  when I went into premature labor"; no abnormal reaction noted    Past Surgical History:  Procedure Laterality Date  . BREAST BIOPSY Left 2018  . BREAST LUMPECTOMY WITH RADIOACTIVE SEED AND SENTINEL LYMPH NODE BIOPSY Left 10/28/2016   Procedure: LEFT BREAST LUMPECTOMY  WITH BRACKETED RADIOACTIVE SEED AND SENTINEL LYMPH NODE BIOPSY;  Surgeon: [name], MD;  Location: [place];  Service: General;  Laterality: Left;  . BREAST RECONSTRUCTION WITH PLACEMENT OF TISSUE EXPANDER AND FLEX HD (ACELLULAR HYDRATED DERMIS) Left 12/23/2016  . BREAST RECONSTRUCTION WITH PLACEMENT OF TISSUE EXPANDER AND FLEX HD (ACELLULAR HYDRATED DERMIS) Left 12/23/2016   Procedure: IMMEDIATE LEFT BREAST RECONSTRUCTION WITH PLACEMENT OF TISSUE EXPANDER AND FLEX HD (ACELLULAR HYDRATED DERMIS);  Surgeon: Wallace Going, DO;  Location: [place];  Service: Plastics;  Laterality: Left;  . CESAREAN SECTION N/A 01/22/2013   Procedure: primary CESAREAN SECTION of baby boy at 2001;  Surgeon: Cheri Fowler, MD;  Location: [facility];  Service: Obstetrics;  Laterality: N/A;  . DILATION AND CURETTAGE OF UTERUS    . HERNIA REPAIR     UHR  . INDUCED ABORTION    . LAMINECTOMY N/A 04/28/2018   Procedure: THORACIC three corpectomy, excision of tumor, fusion and plating;  Surgeon: Newman Pies, MD;  Location: [place];  Service: Neurosurgery;  Laterality: N/A;  . LAPAROSCOPIC CHOLECYSTECTOMY  Aug 2000  . LIPOSUCTION WITH LIPOFILLING Left 11/10/2017   Procedure: FAT GRAFTING TO LEFT UPPER POLL CHEST;  Surgeon: Wallace Going, DO;  Location: [place];  Service: Plastics;  Laterality: Left;  Marland Kitchen MASTECTOMY Left 12/23/2016  . PARACENTESIS  07/17/2015   tube put in to drain fluid from near liver; US guided/notes 07/17/2015  . PLACEMENT OF BREAST IMPLANTS Right 11/10/2017   Procedure: PLACEMENT OF BREAST IMPLANT RIGHT;  Surgeon: Wallace Going, DO;  Location: [place];  Service: Plastics;  Laterality: Right;  . PORT-A-CATH REMOVAL Right 02/25/2017   Procedure: REMOVAL PORT-A-CATH;  Surgeon: Wallace Going, DO;  Location: [place];  Service: Plastics;  Laterality: Right;  . PORTACATH PLACEMENT Right 07/24/2015   Procedure: INSERTION PORT-A-CATH RIGHT  SUBCLAVIAN;  Surgeon: [name], MD;  Location: [facility];  Service: General;  Laterality: Right;  . RE-EXCISION OF BREAST LUMPECTOMY Left 11/09/2016   Procedure: RE-EXCISION OF BREAST LUMPECTOMY;  Surgeon: [name], MD;  Location: [place];  Service: General;  Laterality: Left;  . REMOVAL OF TISSUE EXPANDER AND PLACEMENT OF IMPLANT Left 02/25/2017   Procedure: REMOVAL OF LEFT BREAST  TISSUE EXPANDER AND PLACEMENT OF LEFT SILICONE IMPLANT;  Surgeon: Wallace Going, DO;  Location: [place];  Service: Plastics;  Laterality: Left;  . STERNOTOMY N/A 04/28/2018   Procedure: STERNOTOMY;  Surgeon: Prescott Gum, Collier Salina, MD;  Location: [place];  Service: Open Heart Surgery;  Laterality: N/A;  . TOTAL MASTECTOMY Left 12/23/2016   Procedure: LEFT MASTECTOMY;  Surgeon: [name], MD;  Location: [place];  Service: General;  Laterality: Left;  . UMBILICAL HERNIA REPAIR  ~ 1983  . WISDOM TOOTH EXTRACTION  2002     Prior to Admission medications   Medication Sig Start Date End Date Taking? Authorizing Provider  ALPHA LIPOIC ACID PO Take 1 capsule by mouth daily.   Yes [provider]  cyclobenzaprine (FLEXERIL) 10 MG tablet Take 1 tablet (10 mg total) by mouth 3 (three) times daily as needed for muscle spasms. 04/30/18  Yes Judith Part, MD  docusate sodium (  COLACE) 100 MG capsule Take 1 capsule (100 mg total) by mouth 2 (two) times daily. 04/30/18  Yes Judith Part, MD  metoprolol tartrate (LOPRESSOR) 25 MG tablet Take 1 tablet (25 mg total) by mouth 2 (two) times daily. 05/17/18  Yes Nicholas Lose, MD  Multiple Vitamin (MULTIVITAMIN) tablet Take 1 tablet by mouth daily.   Yes [provider]  oxyCODONE (OXY IR/ROXICODONE) 5 MG immediate release tablet Take 1 tablet (5 mg total) by mouth every 3 (three) hours as needed (pain). 04/30/18  Yes Judith Part, MD  TURMERIC PO Take 1 capsule by mouth daily.   Yes [provider]     Inpatient Medications: Scheduled Meds: . [START ON 05/25/2018] Influenza vac split quadrivalent PF  0.5 mL Intramuscular Tomorrow-1000  . [START ON 05/25/2018] pneumococcal 23 valent vaccine  0.5 mL Intramuscular Tomorrow-1000  . sodium chloride flush  3 mL Intravenous Q12H   Continuous Infusions: . sodium chloride    . sodium chloride 50 mL/hr at 05/24/18 0400   PRN Meds: sodium chloride, acetaminophen **OR** acetaminophen, HYDROcodone-acetaminophen, ondansetron **OR** ondansetron (ZOFRAN) IV, oxyCODONE, sodium chloride flush  Allergies:   No Known Allergies  Social History:   Social History   Socioeconomic History  . Marital status: Legally Separated    Spouse name: Not on file  . Number of children: Not on file  . Years of education: Not on file  . Highest education level: Not on file  Occupational History  . Not on file  Social Needs  . Financial resource strain: Not on file  . Food insecurity:    Worry: Not on file    Inability: Not on file  . Transportation needs:    Medical: No    Non-medical: No  Tobacco Use  . Smoking status: Never Smoker  . Smokeless tobacco: Never Used  Substance and Sexual Activity  . Alcohol use: Yes    Comment: 12/23/2016 "might have a drink a few times/year"  . Drug use: No  . Sexual activity: Yes    Birth control/protection: Condom  Lifestyle  . Physical activity:    Days per week: Not on file    Minutes per session: Not on file  . Stress: Not on file  Relationships  . Social connections:    Talks on phone: Not on file    Gets together: Not on file    Attends religious service: Not on file    Active member of club or organization: Not on file    Attends meetings of clubs or organizations: Not on file    Relationship status: Not on file  . Intimate partner violence:    Fear of current or ex partner: No    Emotionally abused: No    Physically abused: No    Forced sexual activity: No  Other Topics Concern  . Not on file   Social History Narrative  . Not on file    Family History:   Family History  Problem Relation Age of Onset  . Hypertension Mother   . Diabetes Maternal Grandmother    Family Status:  Family Status  Relation Name Status  . Mother  Alive  . Father  Alive  . MGM  Deceased  . PGF  Deceased  . Sister  Alive  . MGF  Deceased  . PGM  Alive    ROS:  Please see the history of present illness.  All other ROS reviewed and negative.     Physical Exam/Data:  Vitals:   05/24/18 0030 05/24/18 0045 05/24/18 0146 05/24/18 0442  BP: 131/86 130/84 95/70 103/74  Pulse: 98 96 93 93  Resp: (!) 23 (!) 30    Temp:   99.6 F (37.6 C) 99.2 F (37.3 C)  TempSrc:   Oral Oral  SpO2: 99% 98% 100% 100%  Weight:   55.1 kg   Height:   '5\' 3"'$  (1.6 m)     Intake/Output Summary (Last 24 hours) at 05/24/2018 0651 Last data filed at 05/24/2018 0555 Gross per 24 hour  Intake 105.95 ml  Output 650 ml  Net -544.05 ml   Filed Weights   05/24/18 0146  Weight: 55.1 kg   Body mass index is 21.52 kg/m.   General: Frail,  NAD Skin: Warm, dry, intact  Head: Normocephalic, atraumatic, clear, moist mucus membranes. Neck: Negative for carotid bruits. No JVD Lungs:Clear to ausculation bilaterally. No wheezes, rales, or rhonchi. Breathing is unlabored. Cardiovascular: RRR with S1 S2. No murmurs, rubs, gallops, or LV heave appreciated. Abdomen: Soft, tender, distended with normoactive bowel sounds.  No obvious abdominal masses. MSK: Strength and tone appear normal for age. 5/5 in all extremities Extremities: No edema. No clubbing or cyanosis. DP/PT pulses 2+ bilaterally Neuro: Alert and oriented. No focal deficits. No facial asymmetry. MAE spontaneously. Psych: Responds to questions appropriately with normal affect.     EKG:  The EKG was personally reviewed and demonstrates: 05/24/18 NSR with no acute changes  Telemetry:  Telemetry was personally reviewed and demonstrates:  05/24/18 ST HR  105  Relevant CV Studies:  ECHO: Pending  CATH: None   Laboratory Data:  Chemistry Recent Labs  Lab 05/23/18 1614 05/24/18 0313  NA 136 131*  K 3.3* 3.9  CL 94* 97*  CO2 28 25  GLUCOSE 93 96  BUN 8 6  CREATININE 0.68 0.75  CALCIUM 9.2 8.3*  GFRNONAA >60 >60  GFRAA >60 >60  ANIONGAP 14 9    Total Protein  Date Value Ref Range Status  05/24/2018 6.6 6.5 - 8.1 g/dL Final  01/05/2017 7.3 6.4 - 8.3 g/dL Final   Albumin  Date Value Ref Range Status  05/24/2018 2.3 (L) 3.5 - 5.0 g/dL Final  01/05/2017 3.9 3.5 - 5.0 g/dL Final   AST  Date Value Ref Range Status  05/24/2018 123 (H) 15 - 41 U/L Final  05/13/2018 129 (H) 15 - 41 U/L Final  01/05/2017 23 5 - 34 U/L Final   ALT  Date Value Ref Range Status  05/24/2018 53 (H) 0 - 44 U/L Final  05/13/2018 82 (H) 0 - 44 U/L Final  01/05/2017 18 0 - 55 U/L Final   Alkaline Phosphatase  Date Value Ref Range Status  05/24/2018 188 (H) 38 - 126 U/L Final  01/05/2017 67 40 - 150 U/L Final   Total Bilirubin  Date Value Ref Range Status  05/24/2018 0.7 0.3 - 1.2 mg/dL Final  05/13/2018 1.2 0.3 - 1.2 mg/dL Final  01/05/2017 0.58 0.20 - 1.20 mg/dL Final   Hematology Recent Labs  Lab 05/23/18 1614 05/24/18 0313  WBC 11.2* 11.8*  RBC 3.78* 3.20*  HGB 10.2* 8.4*  HCT 33.7* 28.1*  MCV 89.2 87.8  MCH 27.0 26.3  MCHC 30.3 29.9*  RDW 13.0 13.0  PLT 554* 442*   Cardiac EnzymesNo results for input(s): TROPONINI in the last 168 hours.  Recent Labs  Lab 05/23/18 1620  TROPIPOC 0.00    BNPNo results for input(s): BNP, PROBNP in the last 168 hours.  DDimer No results for input(s): DDIMER in the last 168 hours. TSH:  Lab Results  Component Value Date   TSH 2.192 05/24/2018   Lipids:No results found for: CHOL, HDL, LDLCALC, LDLDIRECT, TRIG, CHOLHDL HgbA1c:No results found for: HGBA1C  Radiology/Studies:  Dg Chest 2 View  Result Date: 05/23/2018 CLINICAL DATA:  Status post thoracic spine surgery on April 28, 2018. Onset of chest pain today associated with shortness of breath history of previous left breast malignancy with mastectomy. Currently has tachycardia. EXAM: CHEST - 2 VIEW COMPARISON:  Portable chest x-ray of May 13, 2018 FINDINGS: The lungs are mildly hyperinflated. There is no pleural effusion or pneumothorax. The cardiac silhouette is enlarged. The pulmonary vascularity is normal. The sternal wires are intact. The patient has undergone an anterior fusion procedure in the upper thoracic spine. IMPRESSION: Mild hyperinflation may be voluntary or may reflect underlying reactive airway disease. No evidence of pneumonia, CHF, nor metastatic disease. Electronically Signed   By: David  Martinique M.D.   On: 05/23/2018 16:34   Ct Angio Chest Pe W/cm &/or Wo Cm  Addendum Date: 05/23/2018   ADDENDUM REPORT: 05/23/2018 19:31 ADDENDUM: Addendum created to address the impression. IMPRESSION: The second finding should read: New large pericardial effusion.  Recommend correlation with ECHO. Electronically Signed   By: Corrie Mckusick D.O.   On: 05/23/2018 19:31   Result Date: 05/23/2018 CLINICAL DATA:  37 year old female with a history of chest pain and shortness of breath. History of breast carcinoma with prior treatment. EXAM: CT ANGIOGRAPHY CHEST CT ABDOMEN AND PELVIS WITH CONTRAST TECHNIQUE: Multidetector CT imaging of the chest was performed using the standard protocol during bolus administration of intravenous contrast. Multiplanar CT image reconstructions and MIPs were obtained to evaluate the vascular anatomy. Multidetector CT imaging of the abdomen and pelvis was performed using the standard protocol during bolus administration of intravenous contrast. CONTRAST:  140m ISOVUE-370 IOPAMIDOL (ISOVUE-370) INJECTION 76% COMPARISON:  CT abdomen 07/23/2017 MR 04/27/2018 FINDINGS: CTA CHEST FINDINGS Cardiovascular: Heart: New pericardial effusion. No significant calcifications of the valves or a coronary arteries.  Surgical changes of median sternotomy. Aorta: Unremarkable course, caliber, contour of the thoracic aorta. No aneurysm or dissection flap. No periaortic fluid. Pulmonary arteries: No central, lobar, segmental, or proximal subsegmental filling defects. Mediastinum/Nodes: No large lymph nodes identified. Streak artifact somewhat limits evaluation. There are small lymph nodes in the axillary regions. Surgical changes of left axilla. Surgical changes of bilateral breast reconstruction/augmentation. Unremarkable thyroid. Lungs/Pleura: No confluent airspace disease. No endotracheal or endobronchial debris. No pneumothorax. There are new pulmonary nodules compared to the prior CT. These include peripheral nodules of the bilateral upper lobes image 37, 38, 42 of series 6. Lingular nodule on image 78. Thickening of the interlobular septa of the right upper lobe, with no confluent airspace disease. Atelectasis at the right lung base. Musculoskeletal: Surgical changes of T2-T3, with T3 corpectomy. No acute fracture. Since the prior CT there have been interval development of a erosive bony changes at the posterior aspect of the left third rib, fourth rib, left posterior elements of T3 and T4. Review of the MIP images confirms the above findings. CT ABDOMEN and PELVIS FINDINGS Hepatobiliary: Compared to the prior CT of January, there has been near replacement of the liver parenchyma with metastatic disease. Surgical changes of cholecystectomy. Pancreas: Unremarkable pancreas Spleen: Unremarkable spleen Adrenals/Urinary Tract: Unremarkable adrenal glands. Stomach/Bowel: Unremarkable stomach. Unremarkable small bowel. No abnormal distention. No transition point. Unremarkable colon with moderate to large stool burden. Vascular/Lymphatic: Unremarkable  aorta. Proximal femoral arteries remain patent. Engorged pelvic veins. 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 Left gonadal vein remains patent. Unremarkable IVC.  Unremarkable appearance of the iliac veins. Reproductive: Unremarkable uterus.  Unremarkable adnexa. Other: Low-density ascites within the pelvis. No retroperitoneal or pelvic adenopathy. Musculoskeletal: No acute displaced fracture. No aggressive bony lesions of the spine and pelvis. Review of the MIP images confirms the above findings. IMPRESSION: CT is negative for pulmonary embolism. New large pleural effusion.  Recommend correlation with ECHO. Compared to the prior CT of January, there has been significant progression of metastatic disease, with now near replacement of liver parenchyma, new pulmonary nodules, and new erosive bony changes of T2, T3, and the left second and third ribs. 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 Referral for oncologic follow-up recommended. Electronically Signed: By: Corrie Mckusick D.O. On: 05/23/2018 19:01   Ct Abdomen Pelvis W Contrast  Addendum Date: 05/23/2018   ADDENDUM REPORT: 05/23/2018 19:31 ADDENDUM: Addendum created to address the impression. IMPRESSION: The second finding should read: New large pericardial effusion.  Recommend correlation with ECHO. Electronically Signed   By: Corrie Mckusick D.O.   On: 05/23/2018 19:31   Result Date: 05/23/2018 CLINICAL DATA:  37 year old female with a history of chest pain and shortness of breath. History of breast carcinoma with prior treatment. EXAM: CT ANGIOGRAPHY CHEST CT ABDOMEN AND PELVIS WITH CONTRAST TECHNIQUE: Multidetector CT imaging of the chest was performed using the standard protocol during bolus administration of intravenous contrast. Multiplanar CT image reconstructions and MIPs were obtained to evaluate the vascular anatomy. Multidetector CT imaging of the abdomen and pelvis was performed using the standard protocol during bolus administration of intravenous contrast. CONTRAST:  138m ISOVUE-370 IOPAMIDOL (ISOVUE-370) INJECTION 76% COMPARISON:  CT abdomen 07/23/2017 MR  04/27/2018 FINDINGS: CTA CHEST FINDINGS Cardiovascular: Heart: New pericardial effusion. No significant calcifications of the valves or a coronary arteries. Surgical changes of median sternotomy. Aorta: Unremarkable course, caliber, contour of the thoracic aorta. No aneurysm or dissection flap. No periaortic fluid. Pulmonary arteries: No central, lobar, segmental, or proximal subsegmental filling defects. Mediastinum/Nodes: No large lymph nodes identified. Streak artifact somewhat limits evaluation. There are small lymph nodes in the axillary regions. Surgical changes of left axilla. Surgical changes of bilateral breast reconstruction/augmentation. Unremarkable thyroid. Lungs/Pleura: No confluent airspace disease. No endotracheal or endobronchial debris. No pneumothorax. There are new pulmonary nodules compared to the prior CT. These include peripheral nodules of the bilateral upper lobes image 37, 38, 42 of series 6. Lingular nodule on image 78. Thickening of the interlobular septa of the right upper lobe, with no confluent airspace disease. Atelectasis at the right lung base. Musculoskeletal: Surgical changes of T2-T3, with T3 corpectomy. No acute fracture. Since the prior CT there have been interval development of a erosive bony changes at the posterior aspect of the left third rib, fourth rib, left posterior elements of T3 and T4. Review of the MIP images confirms the above findings. CT ABDOMEN and PELVIS FINDINGS Hepatobiliary: Compared to the prior CT of January, there has been near replacement of the liver parenchyma with metastatic disease. Surgical changes of cholecystectomy. Pancreas: Unremarkable pancreas Spleen: Unremarkable spleen Adrenals/Urinary Tract: Unremarkable adrenal glands. Stomach/Bowel: Unremarkable stomach. Unremarkable small bowel. No abnormal distention. No transition point. Unremarkable colon with moderate to large stool burden. Vascular/Lymphatic: Unremarkable aorta. Proximal femoral  arteries remain patent. Engorged pelvic veins. 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 Left gonadal vein remains  patent. Unremarkable IVC. Unremarkable appearance of the iliac veins. Reproductive: Unremarkable uterus.  Unremarkable adnexa. Other: Low-density ascites within the pelvis. No retroperitoneal or pelvic adenopathy. Musculoskeletal: No acute displaced fracture. No aggressive bony lesions of the spine and pelvis. Review of the MIP images confirms the above findings. IMPRESSION: CT is negative for pulmonary embolism. New large pleural effusion.  Recommend correlation with ECHO. Compared to the prior CT of January, there has been significant progression of metastatic disease, with now near replacement of liver parenchyma, new pulmonary nodules, and new erosive bony changes of T2, T3, and the left second and third ribs. 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 Referral for oncologic follow-up recommended. Electronically Signed: By: Corrie Mckusick D.O. On: 05/23/2018 19:01   Assessment and Plan:   1.  Pericardial effusion with c/o SOB and hx of metastatic breast CA: -Pt presented to [facility] on [facility]18 with persistent SOB, DOE and chest pain/tightness which has been intermittent since approximately 04/28/18 after her thoracic tumor removal surgery. -CTA performed 05/23/18 with new large pericardial effusion. Primary team discussed with cardiology for transfer to [facility] for possible paracentesis. She was noted to be hemodynamically stable with no evidence of tamponade>>>likely causing development of acute symptoms  -Echocardiogram to be performed today, 05/24/18>>>will determine best treatment option  -BP stable at 103/74>95/70>130/84>131/86 -Resting comfortably now   2.  History of metastatic breast CA: -Initially diagnosed with metastatic breast CA in 2016>> now with concern for metastasis to lung and liver CA with new pulmonary nodules in bilateral  upper lobes, development of erosion bony changes in the posterior aspect of the third rib, T3/T4 and near replacement of the liver parenchyma with metastatic disease on chest and abdominal CT from 05/23/18 -Continues to have significant abdominal pain however at her baseline   3. HTN: -Stable, 103/74>95/70>130/84>131/86 -Recently seen in referral for tachycardia and started on low dose BB>>metoprolol '25mg'$  twice daily  -Currently on hold secondary to pericardial effusion  -Continue to monitor closely    For questions or updates, please contact [facility] Please consult [email or web address] for contact info under Cardiology/STEMI.   SignedKathyrn Drown NP-C [facility] Pager: [phone] 05/24/2018 6:51 AM

## 2018-05-24 NOTE — Progress Notes (Signed)
Dawn HOSPITALIST PROGRESS NOTE  Dawn [facility] DTO:[id number] DOB: 1980/11/16 DOA: 05/23/2018 PCP: Dawn Sic, PA   Narrative: 37 year old female Known history of triple negative breast cancer prior palliative chemotherapy carboplatin/gemcitabine status post mastectomy followed by Dr. Milas Haynes ongoing at this time [facility] from T3 pathological fracture, posterior epidural tumor T1-T8, thoracic epidural tumor status post surgery Dr. 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  Because of the finding she was seen at [facility] and decision was made to transfer over after discussion with cardiology for therapeutic pericardiocentesis  A & Plan Pericardial effusion-Cardiology input appreciated in advance-would NOT schedule physical therapy nor transport off unit for non emergent issues-have cancelled her XRT--she is SOB and tachy just on sitting up in bed and is physiologically compensated soley on the basis of her young  Left triple negative breast cancer status post Rx with mastectomy-previously carboplatin gemcitabine currently undergoing XRT--woul dresume once demonstrated stability for a couple of Days Rad-Onc aware Liver and lung metastases On no chemo agents at this time  Moderate malnutrition with BMI 21    DVT prophylaxis: lovenox  Code Status: full   Family Communication:  D/w Dawn [facility]nes briefly   Disposition Plan: inpatient    Dawn Au, MD  [facility]wn [facility] Direct contact: [phone] --Via amion app OR  --[email or web address];  password [email or web address]  7PM-7AM contact night coverage as above 05/24/2018, 8:17 AM  LOS: 1 day   Consultants:  cardiology  Procedures:    Antimicrobials:    Interval history/Subjective: Awake alert some abnd pain Asked for laxative earlier No cp No fever SOB just on sitting up and gets winded  Objective:  Vitals:  Vitals:   05/24/18 0700 05/24/18 0802  BP:  112/74  Pulse: 94 100  Resp:  19  Temp:  99.5 F (37.5 C)  SpO2: 100% 100%    Exam:  . Frail pleasant AAF, bald . s1 s 2tachy . cta b no adde dosund . abd soft mild tende rin LLQ . Neuro intact no defiit   I have personally reviewed the following:   Labs:  Sodium 131 alk phos 188 AST/ALT 123/53  WBC 11.8 hemoglobin 8.4 from hemoglobin of 10.29 platelet 442  Imaging studies:  As discussed in HPI  Medical tests:  No  Test discussed with performing physician:  No  Decision to obtain old records:  No  Review and summation of old records:  Yes  Scheduled Meds: . [START ON 05/25/2018] Influenza vac split quadrivalent PF  0.5 mL Intramuscular Tomorrow-1000  . [START ON 05/25/2018] pneumococcal 23 valent vaccine  0.5 mL Intramuscular Tomorrow-1000  . sodium chloride flush  3 mL Intravenous Q12H   Continuous Infusions: . sodium chloride    . sodium chloride 50 mL/hr at 05/24/18 0400    Active Problems:   Breast cancer of upper-outer quadrant of left female breast (HCC)   SOB (shortness of breath)   Metastasis to liver (HCC)   Pericardial effusion   Hypokalemia   LOS: 1 day

## 2018-05-24 NOTE — Plan of Care (Signed)

## 2018-05-24 NOTE — ED Notes (Signed)
ED TO INPATIENT HANDOFF REPORT  Name/Age/Gender Dawn Haynes 37 y.o. female  Code Status Code Status History    Date Active Date Inactive Code Status Order ID Comments User Context   04/27/2018 2218 04/30/2018 2108 Full Code [id number]  Newman Pies, MD ED   12/23/2016 1130 12/23/2016 1250 Full Code [id number]  Wallace Going, DO Inpatient   07/24/2015 1719 07/29/2015 2350 Full Code [id number]  [name], MD Inpatient   07/18/2015 0401 07/24/2015 1719 Full Code [id number]  Theressa Millard, MD Inpatient      Home/SNF/Other Home  Chief Complaint Intestinal Issue  Level of Care/Admitting Diagnosis ED Disposition    ED Disposition Condition [facility] Area: [place] [100100]  Level of Care: Stepdown [14]  Diagnosis: Pericardial effusion [332951]  Admitting Physician: Toy Baker [[id number]]  Attending Physician: Toy Baker [[id number]]  Estimated length of stay: 3 - 4 days  Certification:: I certify this patient will need inpatient services for at least 2 midnights  PT Class (Do Not Modify): Inpatient [101]  PT Acc Code (Do Not Modify): Private [1]       Medical History Past Medical History:  Diagnosis Date  . Breast cancer of upper-outer quadrant of left female breast ([place]) 07/11/2015  . DDD (degenerative disc disease), lumbar    L1-2  . Genetic testing 01/12/2017   Ms. Pehrson underwent genetic counseling and testing for hereditary cancer syndromes on 01/05/2017. Her results were negative for mutations in all 46 genes analyzed by [facility]'s 46-gene Common Hereditary Cancers Panel. Genes analyzed include: APC, ATM, AXIN2, BARD1, BMPR1A, BRCA1, BRCA2, BRIP1, CDH1, CDKN2A, CHEK2, CTNNA1, DICER1, EPCAM, GREM1, HOXB13, KIT, MEN1, MLH1, MSH2, MSH3, MSH6, MUTYH, NBN,   . Genetic testing of female    [facility] panel negative 01/2017  . History of blood transfusion    "when I lost alot of blood when I went into premature labor"; no  abnormal reaction noted    Allergies No Known Allergies  IV Location/Drains/Wounds Patient Lines/Drains/Airways Status   Active Line/Drains/Airways    Name:   Placement date:   Placement time:   Site:   Days:   Peripheral IV 05/23/18 Right Antecubital   05/23/18    1643    Antecubital   1   Incision (Closed) 04/28/18 Chest   04/28/18    1739     26   Incision - 1 Port   07/26/15    -     1033          Labs/Imaging Results for orders placed or performed during the hospital encounter of 05/23/18 (from the past 48 hour(s))  Basic metabolic panel     Status: Abnormal   Collection Time: 05/23/18  4:14 PM  Result Value Ref Range   Sodium 136 135 - 145 mmol/L   Potassium 3.3 (L) 3.5 - 5.1 mmol/L   Chloride 94 (L) 98 - 111 mmol/L   CO2 28 22 - 32 mmol/L   Glucose, Bld 93 70 - 99 mg/dL   BUN 8 6 - 20 mg/dL   Creatinine, Ser 0.68 0.44 - 1.00 mg/dL   Calcium 9.2 8.9 - 10.3 mg/dL   GFR calc non Af Amer >60 >60 mL/min   GFR calc Af Amer >60 >60 mL/min    Comment: (NOTE) The eGFR has been calculated using the CKD EPI equation. This calculation has not been validated in all clinical situations. eGFR's persistently <60 mL/min signify possible Chronic Kidney Disease.  Anion gap 14 5 - 15    Comment: Performed at [facility], [place] [address] [place], [place] [address]  CBC     Status: Abnormal   Collection Time: 05/23/18  4:14 PM  Result Value Ref Range   WBC 11.2 (H) 4.0 - 10.5 K/uL   RBC 3.78 (L) 3.87 - 5.11 MIL/uL   Hemoglobin 10.2 (L) 12.0 - 15.0 g/dL   HCT 33.7 (L) 36.0 - 46.0 %   MCV 89.2 80.0 - 100.0 fL   MCH 27.0 26.0 - 34.0 pg   MCHC 30.3 30.0 - 36.0 g/dL   RDW 13.0 11.5 - 15.5 %   Platelets 554 (H) 150 - 400 K/uL   nRBC 0.0 0.0 - 0.2 %    Comment: Performed at [facility], [place] [address], [place], [place] [address]  POCT i-Stat troponin I     Status: None   Collection Time: 05/23/18  4:20 PM  Result Value Ref Range   Troponin  i, poc 0.00 0.00 - 0.08 ng/mL   Comment 3            Comment: Due to the release kinetics of cTnI, a negative result within the first hours of the onset of symptoms does not rule out myocardial infarction with certainty. If myocardial infarction is still suspected, repeat the test at appropriate intervals.   I-Stat beta hCG blood, ED     Status: None   Collection Time: 05/23/18  4:30 PM  Result Value Ref Range   I-stat hCG, quantitative <5.0 <5 mIU/mL   Comment 3            Comment:   GEST. AGE      CONC.  (mIU/mL)   <=1 WEEK        5 - 50     2 WEEKS       50 - 500     3 WEEKS       100 - 10,000     4 WEEKS     1,000 - 30,000        FEMALE AND NON-PREGNANT FEMALE:     LESS THAN 5 mIU/mL    Dg Chest 2 View  Result Date: 05/23/2018 CLINICAL DATA:  Status post thoracic spine surgery on April 28, 2018. Onset of chest pain today associated with shortness of breath history of previous left breast malignancy with mastectomy. Currently has tachycardia. EXAM: CHEST - 2 VIEW COMPARISON:  Portable chest x-ray of May 13, 2018 FINDINGS: The lungs are mildly hyperinflated. There is no pleural effusion or pneumothorax. The cardiac silhouette is enlarged. The pulmonary vascularity is normal. The sternal wires are intact. The patient has undergone an anterior fusion procedure in the upper thoracic spine. IMPRESSION: Mild hyperinflation may be voluntary or may reflect underlying reactive airway disease. No evidence of pneumonia, CHF, nor metastatic disease. Electronically Signed   By: David  Martinique M.D.   On: 05/23/2018 16:34   Ct Angio Chest Pe W/cm &/or Wo Cm  Addendum Date: 05/23/2018   ADDENDUM REPORT: 05/23/2018 19:31 ADDENDUM: Addendum created to address the impression. IMPRESSION: The second finding should read: New large pericardial effusion.  Recommend correlation with ECHO. Electronically Signed   By: Corrie Mckusick D.O.   On: 05/23/2018 19:31   Result Date: 05/23/2018 CLINICAL DATA:   37 year old female with a history of chest pain and shortness of breath. History of breast carcinoma with prior treatment. EXAM: CT ANGIOGRAPHY CHEST CT ABDOMEN AND PELVIS  WITH CONTRAST TECHNIQUE: Multidetector CT imaging of the chest was performed using the standard protocol during bolus administration of intravenous contrast. Multiplanar CT image reconstructions and MIPs were obtained to evaluate the vascular anatomy. Multidetector CT imaging of the abdomen and pelvis was performed using the standard protocol during bolus administration of intravenous contrast. CONTRAST:  188m ISOVUE-370 IOPAMIDOL (ISOVUE-370) INJECTION 76% COMPARISON:  CT abdomen 07/23/2017 MR 04/27/2018 FINDINGS: CTA CHEST FINDINGS Cardiovascular: Heart: New pericardial effusion. No significant calcifications of the valves or a coronary arteries. Surgical changes of median sternotomy. Aorta: Unremarkable course, caliber, contour of the thoracic aorta. No aneurysm or dissection flap. No periaortic fluid. Pulmonary arteries: No central, lobar, segmental, or proximal subsegmental filling defects. Mediastinum/Nodes: No large lymph nodes identified. Streak artifact somewhat limits evaluation. There are small lymph nodes in the axillary regions. Surgical changes of left axilla. Surgical changes of bilateral breast reconstruction/augmentation. Unremarkable thyroid. Lungs/Pleura: No confluent airspace disease. No endotracheal or endobronchial debris. No pneumothorax. There are new pulmonary nodules compared to the prior CT. These include peripheral nodules of the bilateral upper lobes image 37, 38, 42 of series 6. Lingular nodule on image 78. Thickening of the interlobular septa of the right upper lobe, with no confluent airspace disease. Atelectasis at the right lung base. Musculoskeletal: Surgical changes of T2-T3, with T3 corpectomy. No acute fracture. Since the prior CT there have been interval development of a erosive bony changes at the posterior  aspect of the left third rib, fourth rib, left posterior elements of T3 and T4. Review of the MIP images confirms the above findings. CT ABDOMEN and PELVIS FINDINGS Hepatobiliary: Compared to the prior CT of January, there has been near replacement of the liver parenchyma with metastatic disease. Surgical changes of cholecystectomy. Pancreas: Unremarkable pancreas Spleen: Unremarkable spleen Adrenals/Urinary Tract: Unremarkable adrenal glands. Stomach/Bowel: Unremarkable stomach. Unremarkable small bowel. No abnormal distention. No transition point. Unremarkable colon with moderate to large stool burden. Vascular/Lymphatic: Unremarkable aorta. Proximal femoral arteries remain patent. Engorged pelvic veins. 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 Left gonadal vein remains patent. Unremarkable IVC. Unremarkable appearance of the iliac veins. Reproductive: Unremarkable uterus.  Unremarkable adnexa. Other: Low-density ascites within the pelvis. No retroperitoneal or pelvic adenopathy. Musculoskeletal: No acute displaced fracture. No aggressive bony lesions of the spine and pelvis. Review of the MIP images confirms the above findings. IMPRESSION: CT is negative for pulmonary embolism. New large pleural effusion.  Recommend correlation with ECHO. Compared to the prior CT of January, there has been significant progression of metastatic disease, with now near replacement of liver parenchyma, new pulmonary nodules, and new erosive bony changes of T2, T3, and the left second and third ribs. 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 Referral for oncologic follow-up recommended. Electronically Signed: By: JCorrie MckusickD.O. On: 05/23/2018 19:01   Ct Abdomen Pelvis W Contrast  Addendum Date: 05/23/2018   ADDENDUM REPORT: 05/23/2018 19:31 ADDENDUM: Addendum created to address the impression. IMPRESSION: The second finding should read: New large pericardial effusion.   Recommend correlation with ECHO. Electronically Signed   By: JCorrie MckusickD.O.   On: 05/23/2018 19:31   Result Date: 05/23/2018 CLINICAL DATA:  37year old female with a history of chest pain and shortness of breath. History of breast carcinoma with prior treatment. EXAM: CT ANGIOGRAPHY CHEST CT ABDOMEN AND PELVIS WITH CONTRAST TECHNIQUE: Multidetector CT imaging of the chest was performed using the standard protocol during bolus administration of intravenous contrast. Multiplanar CT image  reconstructions and MIPs were obtained to evaluate the vascular anatomy. Multidetector CT imaging of the abdomen and pelvis was performed using the standard protocol during bolus administration of intravenous contrast. CONTRAST:  143m ISOVUE-370 IOPAMIDOL (ISOVUE-370) INJECTION 76% COMPARISON:  CT abdomen 07/23/2017 MR 04/27/2018 FINDINGS: CTA CHEST FINDINGS Cardiovascular: Heart: New pericardial effusion. No significant calcifications of the valves or a coronary arteries. Surgical changes of median sternotomy. Aorta: Unremarkable course, caliber, contour of the thoracic aorta. No aneurysm or dissection flap. No periaortic fluid. Pulmonary arteries: No central, lobar, segmental, or proximal subsegmental filling defects. Mediastinum/Nodes: No large lymph nodes identified. Streak artifact somewhat limits evaluation. There are small lymph nodes in the axillary regions. Surgical changes of left axilla. Surgical changes of bilateral breast reconstruction/augmentation. Unremarkable thyroid. Lungs/Pleura: No confluent airspace disease. No endotracheal or endobronchial debris. No pneumothorax. There are new pulmonary nodules compared to the prior CT. These include peripheral nodules of the bilateral upper lobes image 37, 38, 42 of series 6. Lingular nodule on image 78. Thickening of the interlobular septa of the right upper lobe, with no confluent airspace disease. Atelectasis at the right lung base. Musculoskeletal: Surgical changes  of T2-T3, with T3 corpectomy. No acute fracture. Since the prior CT there have been interval development of a erosive bony changes at the posterior aspect of the left third rib, fourth rib, left posterior elements of T3 and T4. Review of the MIP images confirms the above findings. CT ABDOMEN and PELVIS FINDINGS Hepatobiliary: Compared to the prior CT of January, there has been near replacement of the liver parenchyma with metastatic disease. Surgical changes of cholecystectomy. Pancreas: Unremarkable pancreas Spleen: Unremarkable spleen Adrenals/Urinary Tract: Unremarkable adrenal glands. Stomach/Bowel: Unremarkable stomach. Unremarkable small bowel. No abnormal distention. No transition point. Unremarkable colon with moderate to large stool burden. Vascular/Lymphatic: Unremarkable aorta. Proximal femoral arteries remain patent. Engorged pelvic veins. 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 Left gonadal vein remains patent. Unremarkable IVC. Unremarkable appearance of the iliac veins. Reproductive: Unremarkable uterus.  Unremarkable adnexa. Other: Low-density ascites within the pelvis. No retroperitoneal or pelvic adenopathy. Musculoskeletal: No acute displaced fracture. No aggressive bony lesions of the spine and pelvis. Review of the MIP images confirms the above findings. IMPRESSION: CT is negative for pulmonary embolism. New large pleural effusion.  Recommend correlation with ECHO. Compared to the prior CT of January, there has been significant progression of metastatic disease, with now near replacement of liver parenchyma, new pulmonary nodules, and new erosive bony changes of T2, T3, and the left second and third ribs. 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 Referral for oncologic follow-up recommended. Electronically Signed: By: JCorrie MckusickD.O. On: 05/23/2018 19:01   EKG Interpretation  Date/Time:  Monday May 23 2018 16:01:47  EST Ventricular Rate:  105 PR Interval:    QRS Duration: 74 QT Interval:  288 QTC Calculation: 381 R Axis:   81 Text Interpretation:  Sinus tachycardia Low voltage, precordial leads Borderline T abnormalities, diffuse leads Baseline wander in lead(s) I III aVL Confirmed by ALacretia Leigh([id number]) on 05/23/2018 4:32:03 PM   Pending Labs Unresulted Labs (From admission, onward)    Start     Ordered   05/23/18 2125  Magnesium  Add-on,   R     05/23/18 2124   Signed and Held  Magnesium  Tomorrow morning,   R    Comments:  Call MD if <1.5    Signed and Held   Signed and Held  Phosphorus  Tomorrow morning,   R     Signed and Held   Signed and Held  TSH  Once,   R    Comments:  Cancel if already done within 1 month and notify MD    Signed and Held   Signed and Held  Comprehensive metabolic panel  Once,   R    Comments:  Cal MD for K<3.5 or >5.0    Signed and Held   Signed and Held  CBC  Once,   R    Comments:  Call for hg <8.0    Signed and Held          Vitals/Pain Today's Vitals   05/23/18 2315 05/23/18 2330 05/24/18 0000 05/24/18 0030  BP: 120/71 125/77 117/66 131/86  Pulse: 96 99 96 98  Resp: _0 (!) 23  Temp:      TempSrc:      SpO2: 98% 98% 98% 99%  PainSc:        Isolation Precautions No active isolations  Medications Medications  iopamidol (ISOVUE-370) 76 % injection (has no administration in time range)  sodium chloride (PF) 0.9 % injection (has no administration in time range)  iopamidol (ISOVUE-370) 76 % injection 100 mL (100 mLs Intravenous Contrast Given 05/23/18 1807)  potassium chloride SA (K-DUR,KLOR-CON) CR tablet 40 mEq (40 mEq Oral Given 05/23/18 2255)    Mobility walks

## 2018-05-24 NOTE — Progress Notes (Signed)
PT Cancellation Note  Patient Details Name: Dawn Haynes MRN: [id number] DOB: 10-08-1980   Cancelled Treatment:    Reason Eval/Treat Not Completed: Medical issues which prohibited therapy RN initially reported that patient is OK to participate in therapy, however while PT was organizing/preparing room for session, MD came to examine patient and requested that therapy hold until after procedure to address pericardial effusion. Hold PT for now per MD request and per patient's medical status. Will return when medically appropriate and safe to participate in therapy session.    Deniece Ree PT, DPT, CBIS  Supplemental Physical Therapist [facility]    Pager [phone] Acute Rehab Office [phone]

## 2018-05-24 NOTE — Progress Notes (Signed)
  Echocardiogram 2D Echocardiogram has been performed.  Darlina Sicilian M 05/24/2018, 12:42 PM

## 2018-05-25 ENCOUNTER — Inpatient Hospital Stay: Payer: Medicaid Other | Admitting: Hematology and Oncology

## 2018-05-25 ENCOUNTER — Ambulatory Visit: Payer: Medicaid Other

## 2018-05-25 ENCOUNTER — Ambulatory Visit: Payer: Medicaid Other | Admitting: Radiation Oncology

## 2018-05-25 DIAGNOSIS — G9520 Unspecified cord compression: Secondary | ICD-10-CM

## 2018-05-25 LAB — CBC WITH DIFFERENTIAL/PLATELET
Abs Immature Granulocytes: 0.13 10*3/uL — ABNORMAL HIGH (ref 0.00–0.07)
BASOS ABS: 0 10*3/uL (ref 0.0–0.1)
Basophils Relative: 0 %
EOS ABS: 0.1 10*3/uL (ref 0.0–0.5)
EOS PCT: 1 %
HCT: 29.9 % — ABNORMAL LOW (ref 36.0–46.0)
Hemoglobin: 9.2 g/dL — ABNORMAL LOW (ref 12.0–15.0)
Immature Granulocytes: 1 %
LYMPHS PCT: 11 %
Lymphs Abs: 1.4 10*3/uL (ref 0.7–4.0)
MCH: 26.7 pg (ref 26.0–34.0)
MCHC: 30.8 g/dL (ref 30.0–36.0)
MCV: 86.7 fL (ref 80.0–100.0)
Monocytes Absolute: 1.5 10*3/uL — ABNORMAL HIGH (ref 0.1–1.0)
Monocytes Relative: 11 %
NRBC: 0 % (ref 0.0–0.2)
Neutro Abs: 9.9 10*3/uL — ABNORMAL HIGH (ref 1.7–7.7)
Neutrophils Relative %: 76 %
Platelets: 454 10*3/uL — ABNORMAL HIGH (ref 150–400)
RBC: 3.45 MIL/uL — ABNORMAL LOW (ref 3.87–5.11)
RDW: 13.1 % (ref 11.5–15.5)
WBC: 13 10*3/uL — ABNORMAL HIGH (ref 4.0–10.5)

## 2018-05-25 LAB — RENAL FUNCTION PANEL
Albumin: 2.3 g/dL — ABNORMAL LOW (ref 3.5–5.0)
Anion gap: 8 (ref 5–15)
BUN: 6 mg/dL (ref 6–20)
CALCIUM: 8.6 mg/dL — AB (ref 8.9–10.3)
CHLORIDE: 99 mmol/L (ref 98–111)
CO2: 26 mmol/L (ref 22–32)
CREATININE: 0.64 mg/dL (ref 0.44–1.00)
GFR calc Af Amer: >60 mL/min (ref >60)
GFR calc non Af Amer: >60 mL/min (ref >60)
Glucose, Bld: 88 mg/dL (ref 70–99)
Phosphorus: 3.9 mg/dL (ref 2.5–4.6)
Potassium: 4 mmol/L (ref 3.5–5.1)
Sodium: 133 mmol/L — ABNORMAL LOW (ref 135–145)

## 2018-05-25 LAB — PROTIME-INR
INR: 1.08
Prothrombin Time: 13.9 seconds (ref 11.4–15.2)

## 2018-05-25 LAB — SURGICAL PCR SCREEN
MRSA, PCR: NEGATIVE
Staphylococcus aureus: NEGATIVE

## 2018-05-25 MED ORDER — BISACODYL 10 MG RE SUPP
10.0000 mg | Freq: Every day | RECTAL | Status: DC | PRN
  Administered 2018-05-25: 10 mg via RECTAL
  Filled 2018-05-25: qty 1

## 2018-05-25 NOTE — Progress Notes (Signed)
Progress Note  Patient Name: Dawn Haynes Date of Encounter: 05/25/2018  Primary Cardiologist: Minus Breeding, MD   Subjective   Denies any CP or SOB at rest.   Inpatient Medications    Scheduled Meds: . Influenza vac split quadrivalent PF  0.5 mL Intramuscular Tomorrow-1000  . pneumococcal 23 valent vaccine  0.5 mL Intramuscular Tomorrow-1000  . polyethylene glycol  17 g Oral Daily  . sodium chloride flush  3 mL Intravenous Q12H   Continuous Infusions: . sodium chloride     PRN Meds: sodium chloride, acetaminophen **OR** acetaminophen, bisacodyl, HYDROcodone-acetaminophen, ondansetron **OR** ondansetron (ZOFRAN) IV, oxyCODONE, sodium chloride flush   Vital Signs    Vitals:   05/24/18 1958 05/24/18 2302 05/25/18 0347 05/25/18 0749  BP: 104/74 99/69 110/79 118/73  Pulse: (!) 112 92 (!) 105 (!) 116  Resp:    18  Temp: 98.5 F (36.9 C) 99.1 F (37.3 C) 100.2 F (37.9 C) 98.7 F (37.1 C)  TempSrc: Oral Oral Oral Oral  SpO2: 100% 100% 100% 100%  Weight:      Height:        Intake/Output Summary (Last 24 hours) at 05/25/2018 0931 Last data filed at 05/25/2018 0500 Gross per 24 hour  Intake 300 ml  Output 750 ml  Net -450 ml   Filed Weights   05/24/18 0146  Weight: 55.1 kg    Telemetry    Mild sinus tach with HR 90-100 - Personally Reviewed  ECG    NSR - Personally Reviewed  Physical Exam   GEN: No acute distress.   Neck: No JVD Cardiac: RRR, no murmurs, rubs, or gallops.  Respiratory: Clear to auscultation bilaterally. GI: Soft, nontender, non-distended  MS: No edema; No deformity. Neuro:  Nonfocal  Psych: Normal affect   Labs    Chemistry Recent Labs  Lab 05/23/18 1614 05/24/18 0313 05/25/18 0344  NA 136 131* 133*  K 3.3* 3.9 4.0  CL 94* 97* 99  CO2 28 25 26   GLUCOSE 93 96 88  BUN 8 6 6   CREATININE 0.68 0.75 0.64  CALCIUM 9.2 8.3* 8.6*  PROT  --  6.6  --   ALBUMIN  --  2.3* 2.3*  AST  --  123*  --   ALT  --  53*  --   ALKPHOS   --  188*  --   BILITOT  --  0.7  --   GFRNONAA >60 >60 >60  GFRAA >60 >60 >60  ANIONGAP 14 9 8      Hematology Recent Labs  Lab 05/23/18 1614 05/24/18 0313 05/25/18 0344  WBC 11.2* 11.8* 13.0*  RBC 3.78* 3.20* 3.45*  HGB 10.2* 8.4* 9.2*  HCT 33.7* 28.1* 29.9*  MCV 89.2 87.8 86.7  MCH 27.0 26.3 26.7  MCHC 30.3 29.9* 30.8  RDW 13.0 13.0 13.1  PLT 554* 442* 454*    Cardiac EnzymesNo results for input(s): TROPONINI in the last 168 hours.  Recent Labs  Lab 05/23/18 1620  TROPIPOC 0.00     BNPNo results for input(s): BNP, PROBNP in the last 168 hours.   DDimer No results for input(s): DDIMER in the last 168 hours.   Radiology    Dg Chest 2 View  Result Date: 05/23/2018 CLINICAL DATA:  Status post thoracic spine surgery on April 28, 2018. Onset of chest pain today associated with shortness of breath history of previous left breast malignancy with mastectomy. Currently has tachycardia. EXAM: CHEST - 2 VIEW COMPARISON:  Portable chest x-ray of May 13, 2018 FINDINGS: The lungs are mildly hyperinflated. There is no pleural effusion or pneumothorax. The cardiac silhouette is enlarged. The pulmonary vascularity is normal. The sternal wires are intact. The patient has undergone an anterior fusion procedure in the upper thoracic spine. IMPRESSION: Mild hyperinflation may be voluntary or may reflect underlying reactive airway disease. No evidence of pneumonia, CHF, nor metastatic disease. Electronically Signed   By: David  Martinique M.D.   On: 05/23/2018 16:34   Ct Angio Chest Pe W/cm &/or Wo Cm  Addendum Date: 05/23/2018   ADDENDUM REPORT: 05/23/2018 19:31 ADDENDUM: Addendum created to address the impression. IMPRESSION: The second finding should read: New large pericardial effusion.  Recommend correlation with ECHO. Electronically Signed   By: Corrie Mckusick D.O.   On: 05/23/2018 19:31   Result Date: 05/23/2018 CLINICAL DATA:  37 year old female with a history of chest pain and  shortness of breath. History of breast carcinoma with prior treatment. EXAM: CT ANGIOGRAPHY CHEST CT ABDOMEN AND PELVIS WITH CONTRAST TECHNIQUE: Multidetector CT imaging of the chest was performed using the standard protocol during bolus administration of intravenous contrast. Multiplanar CT image reconstructions and MIPs were obtained to evaluate the vascular anatomy. Multidetector CT imaging of the abdomen and pelvis was performed using the standard protocol during bolus administration of intravenous contrast. CONTRAST:  137mL ISOVUE-370 IOPAMIDOL (ISOVUE-370) INJECTION 76% COMPARISON:  CT abdomen 07/23/2017 MR 04/27/2018 FINDINGS: CTA CHEST FINDINGS Cardiovascular: Heart: New pericardial effusion. No significant calcifications of the valves or a coronary arteries. Surgical changes of median sternotomy. Aorta: Unremarkable course, caliber, contour of the thoracic aorta. No aneurysm or dissection flap. No periaortic fluid. Pulmonary arteries: No central, lobar, segmental, or proximal subsegmental filling defects. Mediastinum/Nodes: No large lymph nodes identified. Streak artifact somewhat limits evaluation. There are small lymph nodes in the axillary regions. Surgical changes of left axilla. Surgical changes of bilateral breast reconstruction/augmentation. Unremarkable thyroid. Lungs/Pleura: No confluent airspace disease. No endotracheal or endobronchial debris. No pneumothorax. There are new pulmonary nodules compared to the prior CT. These include peripheral nodules of the bilateral upper lobes image 37, 38, 42 of series 6. Lingular nodule on image 78. Thickening of the interlobular septa of the right upper lobe, with no confluent airspace disease. Atelectasis at the right lung base. Musculoskeletal: Surgical changes of T2-T3, with T3 corpectomy. No acute fracture. Since the prior CT there have been interval development of a erosive bony changes at the posterior aspect of the left third rib, fourth rib, left  posterior elements of T3 and T4. Review of the MIP images confirms the above findings. CT ABDOMEN and PELVIS FINDINGS Hepatobiliary: Compared to the prior CT of January, there has been near replacement of the liver parenchyma with metastatic disease. Surgical changes of cholecystectomy. Pancreas: Unremarkable pancreas Spleen: Unremarkable spleen Adrenals/Urinary Tract: Unremarkable adrenal glands. Stomach/Bowel: Unremarkable stomach. Unremarkable small bowel. No abnormal distention. No transition point. Unremarkable colon with moderate to large stool burden. Vascular/Lymphatic: Unremarkable aorta. Proximal femoral arteries remain patent. Engorged pelvic veins. 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 Left gonadal vein remains patent. Unremarkable IVC. Unremarkable appearance of the iliac veins. Reproductive: Unremarkable uterus.  Unremarkable adnexa. Other: Low-density ascites within the pelvis. No retroperitoneal or pelvic adenopathy. Musculoskeletal: No acute displaced fracture. No aggressive bony lesions of the spine and pelvis. Review of the MIP images confirms the above findings. IMPRESSION: CT is negative for pulmonary embolism. New large pleural effusion.  Recommend correlation with ECHO. Compared to the prior CT of January, there has  been significant progression of metastatic disease, with now near replacement of liver parenchyma, new pulmonary nodules, and new erosive bony changes of T2, T3, and the left second and third ribs. 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 Referral for oncologic follow-up recommended. Electronically Signed: By: Corrie Mckusick D.O. On: 05/23/2018 19:01   Ct Abdomen Pelvis W Contrast  Addendum Date: 05/23/2018   ADDENDUM REPORT: 05/23/2018 19:31 ADDENDUM: Addendum created to address the impression. IMPRESSION: The second finding should read: New large pericardial effusion.  Recommend correlation with ECHO. Electronically  Signed   By: Corrie Mckusick D.O.   On: 05/23/2018 19:31   Result Date: 05/23/2018 CLINICAL DATA:  37 year old female with a history of chest pain and shortness of breath. History of breast carcinoma with prior treatment. EXAM: CT ANGIOGRAPHY CHEST CT ABDOMEN AND PELVIS WITH CONTRAST TECHNIQUE: Multidetector CT imaging of the chest was performed using the standard protocol during bolus administration of intravenous contrast. Multiplanar CT image reconstructions and MIPs were obtained to evaluate the vascular anatomy. Multidetector CT imaging of the abdomen and pelvis was performed using the standard protocol during bolus administration of intravenous contrast. CONTRAST:  122mL ISOVUE-370 IOPAMIDOL (ISOVUE-370) INJECTION 76% COMPARISON:  CT abdomen 07/23/2017 MR 04/27/2018 FINDINGS: CTA CHEST FINDINGS Cardiovascular: Heart: New pericardial effusion. No significant calcifications of the valves or a coronary arteries. Surgical changes of median sternotomy. Aorta: Unremarkable course, caliber, contour of the thoracic aorta. No aneurysm or dissection flap. No periaortic fluid. Pulmonary arteries: No central, lobar, segmental, or proximal subsegmental filling defects. Mediastinum/Nodes: No large lymph nodes identified. Streak artifact somewhat limits evaluation. There are small lymph nodes in the axillary regions. Surgical changes of left axilla. Surgical changes of bilateral breast reconstruction/augmentation. Unremarkable thyroid. Lungs/Pleura: No confluent airspace disease. No endotracheal or endobronchial debris. No pneumothorax. There are new pulmonary nodules compared to the prior CT. These include peripheral nodules of the bilateral upper lobes image 37, 38, 42 of series 6. Lingular nodule on image 78. Thickening of the interlobular septa of the right upper lobe, with no confluent airspace disease. Atelectasis at the right lung base. Musculoskeletal: Surgical changes of T2-T3, with T3 corpectomy. No acute  fracture. Since the prior CT there have been interval development of a erosive bony changes at the posterior aspect of the left third rib, fourth rib, left posterior elements of T3 and T4. Review of the MIP images confirms the above findings. CT ABDOMEN and PELVIS FINDINGS Hepatobiliary: Compared to the prior CT of January, there has been near replacement of the liver parenchyma with metastatic disease. Surgical changes of cholecystectomy. Pancreas: Unremarkable pancreas Spleen: Unremarkable spleen Adrenals/Urinary Tract: Unremarkable adrenal glands. Stomach/Bowel: Unremarkable stomach. Unremarkable small bowel. No abnormal distention. No transition point. Unremarkable colon with moderate to large stool burden. Vascular/Lymphatic: Unremarkable aorta. Proximal femoral arteries remain patent. Engorged pelvic veins. 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 Left gonadal vein remains patent. Unremarkable IVC. Unremarkable appearance of the iliac veins. Reproductive: Unremarkable uterus.  Unremarkable adnexa. Other: Low-density ascites within the pelvis. No retroperitoneal or pelvic adenopathy. Musculoskeletal: No acute displaced fracture. No aggressive bony lesions of the spine and pelvis. Review of the MIP images confirms the above findings. IMPRESSION: CT is negative for pulmonary embolism. New large pleural effusion.  Recommend correlation with ECHO. Compared to the prior CT of January, there has been significant progression of metastatic disease, with now near replacement of liver parenchyma, new pulmonary nodules, and new erosive bony changes of T2, T3,  and the left second and third ribs. 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 Referral for oncologic follow-up recommended. Electronically Signed: By: Corrie Mckusick D.O. On: 05/23/2018 19:01    Cardiac Studies   Echo 05/24/2018 LV EF: 60% -   65% Study Conclusions  - Left ventricle: The cavity size was  normal. Systolic function was   normal. The estimated ejection fraction was in the range of 60%   to 65%. Wall motion was normal; there were no regional wall   motion abnormalities. Doppler parameters are consistent with   abnormal left ventricular relaxation (grade 1 diastolic   dysfunction). - Pericardium, extracardiac: A large, free-flowing pericardial   effusion was identified circumferential to the heart. The fluid   had no internal echoes.There was no evidence of hemodynamic   compromise.  Impressions:  - Although the pericardial effusion is very large, there is no   evidence of impending tamponade: the inferior vena cava is not   dilated and collapses appropriately with inspiration, the degree   of mitral and tricuspid respiratory flow variation is in   physiological range and there is no right ventricular diastolic   collapse. There is questionable right atrial collapse, but the   right atrium is suboptimally seen.  Patient Profile     37 y.o. female with PMH of breast CA presented with large pericardial effusion and workup demonstrated multiple sites of metastasis. Turned down for pericardial window as she is a poor candidate for anesthesia and surgery.   Assessment & Plan    1. Pericardial effusion   - CTA 05/23/2018 showed new large pericardial effusion.   - Echo 05/24/2018 large pericardial effusion, no evidence of impending tamponade.   - hemodynamically stable. Turned down by CT surgery for pericardial window   - will discuss with MD and interventional team, plan for pericardiocentesis today to allow symptom control while discussing end of life. Ate 2 bites of breakfast, will make NPO for possible procedure.   2. Metastatic breast CA: diagnosed in 2016, now metastatis to lung, liver, bone and spinal cord. Due to spinal cord compression, will need palliative radiation.   3. HTN: off BP medication for now.        For questions or updates, please contact [place] Please consult [email or web address] for contact info under        Signed, Almyra Deforest, [place]  05/25/2018, 9:31 AM

## 2018-05-25 NOTE — Progress Notes (Signed)
Procedure(s) (LRB): SUBXYPHOID PERICARDIAL WINDOW (N/A) TRANSESOPHAGEAL ECHOCARDIOGRAM (TEE) (N/A) Subjective: 37 year old patient with metastatic breast cancer and a large pericardial effusion without evidence of tamponade.  She remains in sinus tachycardia, stable blood pressure and saturation 100% on room air.  I recommended pericardiocentesis for relief of symptoms and against general anesthesia and pericardial window because of the advanced stage of her malignancy and limited expected survival .  I discussed the best approach to drain the effusion today with Dr Radford Pax [Cardiology] for coordination of care The effusion is posteriorly located which would make pericardiocentesis unsafe. Will therefore schedule patient for pericardial window at increased risk in the first available OR opening- Friday am. Objective: Vital signs in last 24 hours: Temp:  [98.5 F (36.9 C)-100.2 F (37.9 C)] 99.4 F (37.4 C) (11/13 1248) Pulse Rate:  [92-116] 106 (11/13 1248) Cardiac Rhythm: Normal sinus rhythm (11/13 1100) Resp:  [18] 18 (11/13 1248) BP: (99-118)/(69-79) 107/74 (11/13 1248) SpO2:  [100 %] 100 % (11/13 1248)  Hemodynamic parameters for last 24 hours:  stable  Intake/Output from previous day: 11/12 0701 - 11/13 0700 In: 400 [I.V.:400] Out: 1050 [Urine:1050] Intake/Output this shift: No intake/output data recorded.    Lab Results: Recent Labs    05/24/18 0313 05/25/18 0344  WBC 11.8* 13.0*  HGB 8.4* 9.2*  HCT 28.1* 29.9*  PLT 442* 454*   BMET:  Recent Labs    05/24/18 0313 05/25/18 0344  NA 131* 133*  K 3.9 4.0  CL 97* 99  CO2 25 26  GLUCOSE 96 88  BUN 6 6  CREATININE 0.75 0.64  CALCIUM 8.3* 8.6*    PT/INR:  Recent Labs    05/25/18 0344  LABPROT 13.9  INR 1.08   ABG No results found for: PHART, HCO3, TCO2, ACIDBASEDEF, O2SAT CBG (last 3)  No results for input(s): GLUCAP in the last 72 hours.  Assessment/Plan: S/P Procedure(s) (LRB): SUBXYPHOID  PERICARDIAL WINDOW (N/A) TRANSESOPHAGEAL ECHOCARDIOGRAM (TEE) (N/A) Large symptomatic pericardial effusion in an unfortunate 37 yo with widely metastatic adenoca of breast. Plan subxyphoid window am 11-15   LOS: 2 days    Tharon Aquas Trigt III 05/25/2018

## 2018-05-25 NOTE — Progress Notes (Signed)
HEMATOLOGY-ONCOLOGY PROGRESS NOTE  SUBJECTIVE: Relapsed of metastatic triple negative breast cancer with cord compression and now admitted with pericardial effusion.  She has extensive metastatic disease replacing majority of her liver as well as lung nodules and potential lymphangitic spread into the lungs.  She has also spine metastases. She called in to our office complaining of tachycardia and we referred her to cardiology who diagnosed her with pericardial effusion and she has been admitted for pericardiocentesis.  It is suspected to be malignant.  She is in the works to undergo palliative radiation to the spine for the cord compression.  She is also expected to go on systemic chemotherapy soon as the radiation is complete.     Breast cancer of upper-outer quadrant of left female breast ([place])   07/02/2015 Mammogram    Left breast mass 5.3 x 5.2 x 2.7 cm at 1:30 position, multiple abnormal enlarged left axillary lymph nodes    07/09/2015 Initial Diagnosis    Left breast biopsy 1:30: IDC grade 3, ER PR 0%, HER-2 negative ratio 1.65, Ki-67 90%; left axillary lymph node biopsy: IDC grade 3 completely replacing the lymph node ER PR 0%, HER-2 negative ratio 1.22 Ki-67 90%    07/25/2015 -  Chemotherapy    Palliative chemotherapy: Carboplatin and gemcitabine day 1 and day 8 every 3 weeks ( metastatic breast cancer with extensive liver metastases and malignant recurrent ascites), switched to gemcitabine every 2 weeks    12/02/2015 Imaging    CT CAP: Decrease in the liver metastases now measuring 7 x 5 cm was previously 8.4 x 9.3 cm.    03/05/2016 Imaging    CT CAP: Interval decrease in the heterogeneous posterior right lower lobe mass from 6.5 x 6.1 cm to 6.2 x 4.8 cm    12/23/2016 Surgery    Status post lumpectomy and reexcision with positive margins 2; required mastectomy; IDC with DCIS grade 3, 6 cm (on prior lumpectomies there were 3.5 cm, 3.3 cm and 2.1 cm tumors); previously SLN negative      Breast cancer ([place])   12/24/2016 Initial Diagnosis    Breast cancer ([place])    01/10/2017 Genetic Testing    Genetic counseling and testing for hereditary cancer syndromes were performed on 01/05/2017. Results are negative for pathogenic mutations in 46 genes analyzed by [facility]'s Common Hereditary Cancers Panel. Results are dated 01/10/2017. Genes tested: APC, ATM, AXIN2, BARD1, BMPR1A, BRCA1, BRCA2, BRIP1, CDH1, CDKN2A, CHEK2, CTNNA1, DICER1, EPCAM, GREM1, HOXB13, KIT, MEN1, MLH1, MSH2, MSH3, MSH6, MUTYH, NBN, NF1, NTHL1, PALB2, PDGFRA, PMS2, POLD1, POLE, PTEN, RAD50, RAD51C, RAD51D, SDHA, SDHB, SDHC, SDHD, SMAD4, SMARCA4, STK11, TP53, TSC1, TSC2, and VHL.          OBJECTIVE: REVIEW OF SYSTEMS:   Constitutional: Generalized weakness Eyes: Denies blurriness of vision Ears, nose, mouth, throat, and face: Denies mucositis or sore throat Respiratory: Denies cough, dyspnea or wheezes Cardiovascular: Denies palpitation, chest discomfort Gastrointestinal:  Denies nausea, heartburn or change in bowel habits Skin: Denies abnormal skin rashes Lymphatics: Denies new lymphadenopathy or easy bruising Neurological: Lower extremity weakness although she is now able to move her feet as well as developed sensation back in the lower half of the body Behavioral/Psych: Very anxious and depressed Extremities: No lower extremity edema   All other systems were reviewed with the patient and are negative.  I have reviewed the past medical history, past surgical history, social history and family history with the patient and they are unchanged from previous note.   PHYSICAL EXAMINATION:  ECOG PERFORMANCE STATUS: 4 - Bedbound  Vitals:   05/25/18 0347 05/25/18 0749  BP: 110/79 118/73  Pulse: (!) 105 (!) 116  Resp:  18  Temp: 100.2 F (37.9 C) 98.7 F (37.1 C)  SpO2: 100% 100%   Filed Weights   05/24/18 0146  Weight: 121 lb 7.6 oz (55.1 kg)    GENERAL:alert, no distress and comfortable SKIN:  skin color, texture, turgor are normal, no rashes or significant lesions EYES: normal, Conjunctiva are pink and non-injected, sclera clear OROPHARYNX:no exudate, no erythema and lips, buccal mucosa, and tongue normal  NECK: supple, thyroid normal size, non-tender, without nodularity LYMPH:  no palpable lymphadenopathy in the cervical, axillary or inguinal LUNGS: clear to auscultation and percussion with normal breathing effort HEART: regular rate & rhythm and no murmurs and no lower extremity edema ABDOMEN: Massively enlarged liver from liver metastases NEURO: alert & oriented x 3 with fluent speech, lower extremity weakness  LABORATORY DATA:  I have reviewed the data as listed CMP Latest Ref Rng & Units 05/25/2018 05/24/2018 05/23/2018  Glucose 70 - 99 mg/dL 88 96 93  BUN 6 - 20 mg/dL '6 6 8  '$ Creatinine 0.44 - 1.00 mg/dL 0.64 0.75 0.68  Sodium 135 - 145 mmol/L 133(L) 131(L) 136  Potassium 3.5 - 5.1 mmol/L 4.0 3.9 3.3(L)  Chloride 98 - 111 mmol/L 99 97(L) 94(L)  CO2 22 - 32 mmol/L '26 25 28  '$ Calcium 8.9 - 10.3 mg/dL 8.6(L) 8.3(L) 9.2  Total Protein 6.5 - 8.1 g/dL - 6.6 -  Total Bilirubin 0.3 - 1.2 mg/dL - 0.7 -  Alkaline Phos 38 - 126 U/L - 188(H) -  AST 15 - 41 U/L - 123(H) -  ALT 0 - 44 U/L - 53(H) -    Lab Results  Component Value Date   WBC 13.0 (H) 05/25/2018   HGB 9.2 (L) 05/25/2018   HCT 29.9 (L) 05/25/2018   MCV 86.7 05/25/2018   PLT 454 (H) 05/25/2018   NEUTROABS 9.9 (H) 05/25/2018    ASSESSMENT AND PLAN: 1.  Metastatic breast cancer triple negative disease relapsed with extensive liver metastases and lung and bone and spinal cord involvement I discussed with her that her disease is extremely aggressive and she has poor prognosis unless she responds once again to systemic therapies. I also discussed with her that she needs to get her affairs in order in case her disease progresses before we are able to deliver systemic therapies.  2. spinal cord compression: Will  need palliative radiation 3.  Pericardial effusion: Today she may be undergoing a pericardiocentesis/window 4.  Metastatic breast cancer treatment plan: We will send the material obtained from the back surgery for molecular testing.  We will assess PD-L1 status to determine if she is a candidate for immunotherapy.  If not she will receive systemic chemotherapy with [place]

## 2018-05-25 NOTE — Progress Notes (Signed)
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 [name], spinal cord compression/T3 pathological fracture/posterior epidural tumor T1-T8 status post surgery by Dr. [name] from neurosurgery 04/28/18.  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  Because of this finding, will was admitted at [facility] and decision was made to transfer her after further discussion with the cardiology for possible therapeutic pericardiocentesis. Patient seen by cardiac risk, who advised pericardial drain by interventional cardiology.  Assessment & Plan:  Pericardial effusion: Likely malignant.  CT 05/23/2018 with pericardial effusion,TTE 05/24/2018 with large pericardial effusion, no evidence of impending tamponade. I discussed with the CT surgery, consulted interventional cardiology for pericardial drain. Currently vitals are stable, on room but tachycardic in low 100.    Breast cancer of upper-outer quadrant of left female breast, triple negative, with metastasis to liver, lung: status post mastectomy, previous chemotherapy undergoing radiotherapy.  Done by me, radiation oncology, holding off on radio therapy at this time.  Overall poor prognosis-has been informed by hematology to get her affairs in order as she has a extremely aggressive metastatic disease.    Spinal cord compression patient will need palliative radiation once more stable Transaminitis from her  mets. Moderate protein Calorie malnutrition, augment nutrition Hypokalemia: Repeat CMP in the morning Hypertension off BP meds.  Blood pressure stable.  DVT prophylaxis: CAD.  Unable to use chemical prophylaxis due to need for procedure Code Status: Full Family Communication: Discussed plan in detail with patient and patient's husband at the bedside Disposition Plan: On hold awaiting procedure   Consultants: Surgery, cardiology  Subjective: Patient resting, anxious to undergo procedure.  At the bedside.  Denies chest pain, shortness of breath, nausea vomiting.  Able to ambulate to the bathroom this morning. Remains tachycardic.  Objective: Vitals:   05/24/18 1958 05/24/18 2302 05/25/18 0347 05/25/18 0749  BP: 104/74 99/69 110/79 118/73  Pulse: (!) 112 92 (!) 105 (!) 116  Resp:    18  Temp: 98.5 F (36.9 C) 99.1 F (37.3 C) 100.2 F (37.9 C) 98.7 F (37.1 C)  TempSrc: Oral Oral Oral Oral  SpO2: 100% 100% 100% 100%  Weight:      Height:        Intake/Output Summary (Last 24 hours) at 05/25/2018 1017 Last data filed at 05/25/2018 0500 Gross per 24 hour  Intake 250 ml  Output 750 ml  Net -500 ml   Filed Weights   05/24/18 0146  Weight: 55.1 kg   Weight change:   Body mass index is 21.52 kg/m.  Examination:  General exam: Appears calm and comfortable, anxious, cachectic, HEENT:PERRL,Oral mucosa moist, Ear/Nose normal on gross exam Respiratory system: Bilateral equal air entry, normal vesicular breath sounds, no wheezes or crackles  Cardiovascular system: Faint but audible S1 & S2 heard, RRR. No JVD Gastrointestinal system: Abdomen is nondistended, soft and nontender. No organomegaly or masses felt. Normal bowel sounds heard. Nervous System:Alert and oriented. No focal neurological deficits. Extremities: No edema, no clubbing ,no cyanosis, distal peripheral pulses palpable.  Skin: No rashes, lesions or ulcers,no icterus ,no pallor MSK: Normal muscle bulk,tone  ,power  Data Reviewed: I have personally reviewed following labs and imaging studies  CBC: Recent Labs  Lab 05/23/18 1614 05/24/18 0313 05/25/18 0344  WBC 11.2* 11.8* 13.0*  NEUTROABS  --   --  9.9*  HGB 10.2* 8.4* 9.2*  HCT 33.7* 28.1* 29.9*  MCV 89.2 87.8 86.7  PLT 554* 442* 732*   Basic Metabolic Panel: Recent Labs  Lab 05/23/18 1614 05/24/18 0313 05/25/18 0344  NA 136 131* 133*  K 3.3* 3.9 4.0  CL 94* 97* 99  CO2 28 25 26   GLUCOSE 93 96 88  BUN 8 6 6   CREATININE 0.68 0.75 0.64  CALCIUM 9.2 8.3* 8.6*  MG 1.8 1.7  --   PHOS  --  3.6 3.9   GFR: Estimated Creatinine Clearance: 78.9 mL/min (by C-G formula based on SCr of 0.64 mg/dL). Liver Function Tests: Recent Labs  Lab 05/24/18 0313 05/25/18 0344  AST 123*  --   ALT 53*  --   ALKPHOS 188*  --   BILITOT 0.7  --   PROT 6.6  --   ALBUMIN 2.3* 2.3*   No results for input(s): LIPASE, AMYLASE in the last 168 hours. No results for input(s): AMMONIA in the last 168 hours. Coagulation Profile: Recent Labs  Lab 05/25/18 0344  INR 1.08   Cardiac Enzymes: No results for input(s): CKTOTAL, CKMB, CKMBINDEX, TROPONINI in the last 168 hours. BNP (last 3 results) No results for input(s): PROBNP in the last 8760 hours. HbA1C: No results for input(s): HGBA1C in the last 72 hours. CBG: No results for input(s): GLUCAP in the last 168 hours. Lipid Profile: No results for input(s): CHOL, HDL, LDLCALC, TRIG, CHOLHDL, LDLDIRECT in the last 72 hours. Thyroid Function Tests: Recent Labs    05/24/18 0313  TSH 2.192   Anemia Panel: No results for input(s): VITAMINB12, FOLATE, FERRITIN, TIBC, IRON, RETICCTPCT in the last 72 hours. Sepsis Labs: No results for input(s): PROCALCITON, LATICACIDVEN in the last 168 hours.  Recent Results (from the past 240 hour(s))  Surgical pcr screen     Status: None   Collection Time: 05/25/18  5:21 AM  Result Value Ref Range Status   MRSA, PCR NEGATIVE NEGATIVE Final    Staphylococcus aureus NEGATIVE NEGATIVE Final    Comment: (NOTE) The Xpert SA Assay (FDA approved for NASAL specimens in patients 90 years of age and older), is one component of a comprehensive surveillance program. It is not intended to diagnose infection nor to guide or monitor treatment. Performed at [facility], [place] [address] [place], [place] [address]       Radiology Studies: Dg Chest 2 View  Result Date: 05/23/2018 CLINICAL DATA:  Status post thoracic spine surgery on April 28, 2018. Onset of chest pain today associated with shortness of breath history of previous left breast malignancy with mastectomy. Currently has tachycardia. EXAM: CHEST - 2 VIEW COMPARISON:  Portable chest x-ray of May 13, 2018 FINDINGS: The lungs are mildly hyperinflated. There is no pleural effusion or pneumothorax. The cardiac silhouette is enlarged. The pulmonary vascularity is normal. The sternal wires are intact. The patient has undergone an anterior fusion procedure in the upper thoracic spine. IMPRESSION: Mild hyperinflation may be voluntary or may reflect underlying reactive airway disease. No evidence of pneumonia, CHF, nor metastatic disease. Electronically Signed   By: David  Martinique M.D.   On: 05/23/2018 16:34   Ct Angio Chest  Pe W/cm &/or Wo Cm  Addendum Date: 05/23/2018   ADDENDUM REPORT: 05/23/2018 19:31 ADDENDUM: Addendum created to address the impression. IMPRESSION: The second finding should read: New large pericardial effusion.  Recommend correlation with ECHO. Electronically Signed   By: Corrie Mckusick D.O.   On: 05/23/2018 19:31   Result Date: 05/23/2018 CLINICAL DATA:  37 year old female with a history of chest pain and shortness of breath. History of breast carcinoma with prior treatment. EXAM: CT ANGIOGRAPHY CHEST CT ABDOMEN AND PELVIS WITH CONTRAST TECHNIQUE: Multidetector CT imaging of the chest was performed using the standard protocol during bolus administration of  intravenous contrast. Multiplanar CT image reconstructions and MIPs were obtained to evaluate the vascular anatomy. Multidetector CT imaging of the abdomen and pelvis was performed using the standard protocol during bolus administration of intravenous contrast. CONTRAST:  144mL ISOVUE-370 IOPAMIDOL (ISOVUE-370) INJECTION 76% COMPARISON:  CT abdomen 07/23/2017 MR 04/27/2018 FINDINGS: CTA CHEST FINDINGS Cardiovascular: Heart: New pericardial effusion. No significant calcifications of the valves or a coronary arteries. Surgical changes of median sternotomy. Aorta: Unremarkable course, caliber, contour of the thoracic aorta. No aneurysm or dissection flap. No periaortic fluid. Pulmonary arteries: No central, lobar, segmental, or proximal subsegmental filling defects. Mediastinum/Nodes: No large lymph nodes identified. Streak artifact somewhat limits evaluation. There are small lymph nodes in the axillary regions. Surgical changes of left axilla. Surgical changes of bilateral breast reconstruction/augmentation. Unremarkable thyroid. Lungs/Pleura: No confluent airspace disease. No endotracheal or endobronchial debris. No pneumothorax. There are new pulmonary nodules compared to the prior CT. These include peripheral nodules of the bilateral upper lobes image 37, 38, 42 of series 6. Lingular nodule on image 78. Thickening of the interlobular septa of the right upper lobe, with no confluent airspace disease. Atelectasis at the right lung base. Musculoskeletal: Surgical changes of T2-T3, with T3 corpectomy. No acute fracture. Since the prior CT there have been interval development of a erosive bony changes at the posterior aspect of the left third rib, fourth rib, left posterior elements of T3 and T4. Review of the MIP images confirms the above findings. CT ABDOMEN and PELVIS FINDINGS Hepatobiliary: Compared to the prior CT of January, there has been near replacement of the liver parenchyma with metastatic disease. Surgical  changes of cholecystectomy. Pancreas: Unremarkable pancreas Spleen: Unremarkable spleen Adrenals/Urinary Tract: Unremarkable adrenal glands. Stomach/Bowel: Unremarkable stomach. Unremarkable small bowel. No abnormal distention. No transition point. Unremarkable colon with moderate to large stool burden. Vascular/Lymphatic: Unremarkable aorta. Proximal femoral arteries remain patent. Engorged pelvic veins. 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 Left gonadal vein remains patent. Unremarkable IVC. Unremarkable appearance of the iliac veins. Reproductive: Unremarkable uterus.  Unremarkable adnexa. Other: Low-density ascites within the pelvis. No retroperitoneal or pelvic adenopathy. Musculoskeletal: No acute displaced fracture. No aggressive bony lesions of the spine and pelvis. Review of the MIP images confirms the above findings. IMPRESSION: CT is negative for pulmonary embolism. New large pleural effusion.  Recommend correlation with ECHO. Compared to the prior CT of January, there has been significant progression of metastatic disease, with now near replacement of liver parenchyma, new pulmonary nodules, and new erosive bony changes of T2, T3, and the left second and third ribs. 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 Referral for oncologic follow-up recommended. Electronically Signed: By: Corrie Mckusick D.O. On: 05/23/2018 19:01   Ct Abdomen Pelvis W Contrast  Addendum Date: 05/23/2018   ADDENDUM REPORT: 05/23/2018 19:31 ADDENDUM: Addendum created to address the impression. IMPRESSION: The  second finding should read: New large pericardial effusion.  Recommend correlation with ECHO. Electronically Signed   By: Corrie Mckusick D.O.   On: 05/23/2018 19:31   Result Date: 05/23/2018 CLINICAL DATA:  37 year old female with a history of chest pain and shortness of breath. History of breast carcinoma with prior treatment. EXAM: CT ANGIOGRAPHY CHEST CT ABDOMEN  AND PELVIS WITH CONTRAST TECHNIQUE: Multidetector CT imaging of the chest was performed using the standard protocol during bolus administration of intravenous contrast. Multiplanar CT image reconstructions and MIPs were obtained to evaluate the vascular anatomy. Multidetector CT imaging of the abdomen and pelvis was performed using the standard protocol during bolus administration of intravenous contrast. CONTRAST:  127mL ISOVUE-370 IOPAMIDOL (ISOVUE-370) INJECTION 76% COMPARISON:  CT abdomen 07/23/2017 MR 04/27/2018 FINDINGS: CTA CHEST FINDINGS Cardiovascular: Heart: New pericardial effusion. No significant calcifications of the valves or a coronary arteries. Surgical changes of median sternotomy. Aorta: Unremarkable course, caliber, contour of the thoracic aorta. No aneurysm or dissection flap. No periaortic fluid. Pulmonary arteries: No central, lobar, segmental, or proximal subsegmental filling defects. Mediastinum/Nodes: No large lymph nodes identified. Streak artifact somewhat limits evaluation. There are small lymph nodes in the axillary regions. Surgical changes of left axilla. Surgical changes of bilateral breast reconstruction/augmentation. Unremarkable thyroid. Lungs/Pleura: No confluent airspace disease. No endotracheal or endobronchial debris. No pneumothorax. There are new pulmonary nodules compared to the prior CT. These include peripheral nodules of the bilateral upper lobes image 37, 38, 42 of series 6. Lingular nodule on image 78. Thickening of the interlobular septa of the right upper lobe, with no confluent airspace disease. Atelectasis at the right lung base. Musculoskeletal: Surgical changes of T2-T3, with T3 corpectomy. No acute fracture. Since the prior CT there have been interval development of a erosive bony changes at the posterior aspect of the left third rib, fourth rib, left posterior elements of T3 and T4. Review of the MIP images confirms the above findings. CT ABDOMEN and PELVIS  FINDINGS Hepatobiliary: Compared to the prior CT of January, there has been near replacement of the liver parenchyma with metastatic disease. Surgical changes of cholecystectomy. Pancreas: Unremarkable pancreas Spleen: Unremarkable spleen Adrenals/Urinary Tract: Unremarkable adrenal glands. Stomach/Bowel: Unremarkable stomach. Unremarkable small bowel. No abnormal distention. No transition point. Unremarkable colon with moderate to large stool burden. Vascular/Lymphatic: Unremarkable aorta. Proximal femoral arteries remain patent. Engorged pelvic veins. 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 Left gonadal vein remains patent. Unremarkable IVC. Unremarkable appearance of the iliac veins. Reproductive: Unremarkable uterus.  Unremarkable adnexa. Other: Low-density ascites within the pelvis. No retroperitoneal or pelvic adenopathy. Musculoskeletal: No acute displaced fracture. No aggressive bony lesions of the spine and pelvis. Review of the MIP images confirms the above findings. IMPRESSION: CT is negative for pulmonary embolism. New large pleural effusion.  Recommend correlation with ECHO. Compared to the prior CT of January, there has been significant progression of metastatic disease, with now near replacement of liver parenchyma, new pulmonary nodules, and new erosive bony changes of T2, T3, and the left second and third ribs. 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 Referral for oncologic follow-up recommended. Electronically Signed: By: Corrie Mckusick D.O. On: 05/23/2018 19:01        Scheduled Meds: . polyethylene glycol  17 g Oral Daily  . sodium chloride flush  3 mL Intravenous Q12H   Continuous Infusions: . sodium chloride       LOS: 2 days    Time spent: More than  50% of that time was spent in counseling and/or coordination of care.      Antonieta Pert, MD [facility] Pager [phone]  If 7PM-7AM, please contact  night-coverage [email or web address] Password [email or web address] 05/25/2018, 10:17 AM   Please Note: This patient record was dictated using Editor, commissioning. Chart creation errors have been sought, but may not always have been located. Such creation errors do not reflect on the Standard of Medical Care.

## 2018-05-26 ENCOUNTER — Ambulatory Visit: Payer: Medicaid Other

## 2018-05-26 ENCOUNTER — Ambulatory Visit (HOSPITAL_COMMUNITY): Payer: Medicaid Other

## 2018-05-26 DIAGNOSIS — Z170 Estrogen receptor positive status [ER+]: Secondary | ICD-10-CM

## 2018-05-26 LAB — CBC
HEMATOCRIT: 29.5 % — AB (ref 36.0–46.0)
HEMOGLOBIN: 9.2 g/dL — AB (ref 12.0–15.0)
MCH: 26.8 pg (ref 26.0–34.0)
MCHC: 31.2 g/dL (ref 30.0–36.0)
MCV: 86 fL (ref 80.0–100.0)
PLATELETS: 445 10*3/uL — AB (ref 150–400)
RBC: 3.43 MIL/uL — ABNORMAL LOW (ref 3.87–5.11)
RDW: 13.2 % (ref 11.5–15.5)
WBC: 13.7 10*3/uL — AB (ref 4.0–10.5)
nRBC: 0 % (ref 0.0–0.2)

## 2018-05-26 LAB — COMPREHENSIVE METABOLIC PANEL
ALK PHOS: 189 U/L — AB (ref 38–126)
ALT: 53 U/L — ABNORMAL HIGH (ref 0–44)
ANION GAP: 13 (ref 5–15)
AST: 128 U/L — ABNORMAL HIGH (ref 15–41)
Albumin: 2.3 g/dL — ABNORMAL LOW (ref 3.5–5.0)
BUN: 7 mg/dL (ref 6–20)
CALCIUM: 8.5 mg/dL — AB (ref 8.9–10.3)
CHLORIDE: 98 mmol/L (ref 98–111)
CO2: 23 mmol/L (ref 22–32)
Creatinine, Ser: 0.69 mg/dL (ref 0.44–1.00)
GFR calc non Af Amer: >60 mL/min (ref >60)
Glucose, Bld: 92 mg/dL (ref 70–99)
Potassium: 4 mmol/L (ref 3.5–5.1)
SODIUM: 134 mmol/L — AB (ref 135–145)
Total Bilirubin: 0.9 mg/dL (ref 0.3–1.2)
Total Protein: 6.9 g/dL (ref 6.5–8.1)

## 2018-05-26 LAB — URINALYSIS, ROUTINE W REFLEX MICROSCOPIC
Bilirubin Urine: NEGATIVE
Glucose, UA: NEGATIVE mg/dL
Hgb urine dipstick: NEGATIVE
Ketones, ur: NEGATIVE mg/dL
Leukocytes, UA: NEGATIVE
Nitrite: NEGATIVE
Protein, ur: NEGATIVE mg/dL
Specific Gravity, Urine: 1.02 (ref 1.005–1.030)
pH: 6 (ref 5.0–8.0)

## 2018-05-26 LAB — PREPARE RBC (CROSSMATCH)

## 2018-05-26 MED ORDER — CEFAZOLIN SODIUM-DEXTROSE 2-4 GM/100ML-% IV SOLN
2.0000 g | INTRAVENOUS | Status: AC
  Administered 2018-05-27: 2 g via INTRAVENOUS
  Filled 2018-05-26: qty 100

## 2018-05-26 NOTE — Progress Notes (Addendum)
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 [name], spinal cord compression/T3 pathological fracture/posterior epidural tumor T1-T8 status post surgery by Dr. [name] from neurosurgery 04/28/18.  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  Because of this finding, will was admitted at [facility] and decision was made to transfer her after further discussion with the cardiology for possible therapeutic pericardiocentesis. Patient seen by cardiac risk, who advised pericardial drain by interventional cardiology.  Assessment & Plan:  Pericardial effusion: Likely malignant.  CT 05/23/2018 with pericardial effusion,TTE 05/24/2018 with large pericardial effusion, no evidence of impending tamponade. I discussed with the CT surgery, consulted interventional cardiology for pericardial drain-however at this time given the risky nature with posterior effusion more than the anterior, cardiology and CT surgery decided on pericardial window on Friday.  Patient remains hemodynamically stable, no chest pain, no hypotensive shortness of breath.   Breast cancer of upper-outer quadrant of left female breast, triple negative, with metastasis to liver, lung: status post mastectomy, previous chemotherapy undergoing radiotherapy.  Done by me, radiation oncology, holding off on radio therapy at this time.  Overall poor prognosis-has been informed by hematology to get her affairs  in order as she has a extremely aggressive metastatic disease. Onco Planning for  Port-A-Cath placement today.    Spinal cord compression patient will need palliative radiation once more stable Transaminitis from her mets. Stable. Moderate protein Calorie malnutrition-cont to augment nutrition Hypokalemia: Improved. Hypertension off BP meds.  Blood pressure stable. Mild hyponatremia: monitor.  DVT prophylaxis: CAD.  Unable to use chemical prophylaxis due to need for procedure Code Status: Full Family Communication: Discussed plan in detail with patient.  No family at the bedside today.  Disposition Plan: On hold awaiting procedure   Consultants: Surgery, cardiology  Subjective: Resting comfortably no acute events overnight.  No chest pain nausea vomiting or shortness of breath.  Objective: Vitals:   05/25/18 1945 05/25/18 2213 05/26/18 0516 05/26/18 0600  BP: 102/72  105/76 115/75  Pulse: (!) 118 99 (!) 110 (!) 108  Resp: 19  19   Temp: 100.3 F (37.9 C) 99.1 F (37.3 C) 99.6 F (37.6 C) 100 F (37.8 C)  TempSrc: Oral Axillary Oral Oral  SpO2: 100% 100% 100% 100%  Weight:      Height:        Intake/Output Summary (Last 24 hours) at 05/26/2018 1120 Last data filed at 05/26/2018 0900 Gross per 24 hour  Intake 160 ml  Output 700 ml  Net -540 ml   Filed Weights   05/24/18 0146  Weight: 55.1 kg   Weight change:   Body mass index is 21.52 kg/m.  Examination: General exam: Calm, comfortable not in acute distress, thin build, sick looking HEENT:Oral mucosa moist, Ear/Nose WNL grossly Respiratory system: Bilateral equal air entry, no crackles and wheezing Cardiovascular system: S1 & S2 heard, No JVD/murmurs.No pedal edema. Gastrointestinal system: Abdomen soft, nontender non-distended, BS +. Nervous System:Alert/awake/oriented at baseline.  Able to move UE and LE Extremities: No  edema,distal peripheral pulses palpable. Skin: No rashes,no icterus. MSK: Normal muscle  bulk,tone ,power  Data Reviewed: I have personally reviewed following labs and imaging studies  CBC: Recent Labs  Lab 05/23/18 1614 05/24/18 0313 05/25/18 0344 05/26/18 0434  WBC 11.2* 11.8* 13.0* 13.7*  NEUTROABS  --   --  9.9*  --   HGB 10.2* 8.4* 9.2* 9.2*  HCT 33.7* 28.1* 29.9* 29.5*  MCV 89.2 87.8 86.7 86.0  PLT 554* 442* 454* 086*   Basic Metabolic Panel: Recent Labs  Lab 05/23/18 1614 05/24/18 0313 05/25/18 0344 05/26/18 0434  NA 136 131* 133* 134*  K 3.3* 3.9 4.0 4.0  CL 94* 97* 99 98  CO2 28 25 26 23   GLUCOSE 93 96 88 92  BUN 8 6 6 7   CREATININE 0.68 0.75 0.64 0.69  CALCIUM 9.2 8.3* 8.6* 8.5*  MG 1.8 1.7  --   --   PHOS  --  3.6 3.9  --    GFR: Estimated Creatinine Clearance: 78.9 mL/min (by C-G formula based on SCr of 0.69 mg/dL). Liver Function Tests: Recent Labs  Lab 05/24/18 0313 05/25/18 0344 05/26/18 0434  AST 123*  --  128*  ALT 53*  --  53*  ALKPHOS 188*  --  189*  BILITOT 0.7  --  0.9  PROT 6.6  --  6.9  ALBUMIN 2.3* 2.3* 2.3*   No results for input(s): LIPASE, AMYLASE in the last 168 hours. No results for input(s): AMMONIA in the last 168 hours. Coagulation Profile: Recent Labs  Lab 05/25/18 0344  INR 1.08   Cardiac Enzymes: No results for input(s): CKTOTAL, CKMB, CKMBINDEX, TROPONINI in the last 168 hours. BNP (last 3 results) No results for input(s): PROBNP in the last 8760 hours. HbA1C: No results for input(s): HGBA1C in the last 72 hours. CBG: No results for input(s): GLUCAP in the last 168 hours. Lipid Profile: No results for input(s): CHOL, HDL, LDLCALC, TRIG, CHOLHDL, LDLDIRECT in the last 72 hours. Thyroid Function Tests: Recent Labs    05/24/18 0313  TSH 2.192   Anemia Panel: No results for input(s): VITAMINB12, FOLATE, FERRITIN, TIBC, IRON, RETICCTPCT in the last 72 hours. Sepsis Labs: No results for input(s): PROCALCITON, LATICACIDVEN in the last 168 hours.  Recent Results (from the past 240 hour(s))    Surgical pcr screen     Status: None   Collection Time: 05/25/18  5:21 AM  Result Value Ref Range Status   MRSA, PCR NEGATIVE NEGATIVE Final   Staphylococcus aureus NEGATIVE NEGATIVE Final    Comment: (NOTE) The Xpert SA Assay (FDA approved for NASAL specimens in patients 36 years of age and older), is one component of a comprehensive surveillance program. It is not intended to diagnose infection nor to guide or monitor treatment. Performed at [facility], [place] [address] [place], [place] [address]       Radiology Studies: No results found.      Scheduled Meds: . polyethylene glycol  17 g Oral Daily  . sodium chloride flush  3 mL Intravenous Q12H   Continuous Infusions: . sodium chloride    . [START ON 05/27/2018]  ceFAZolin (ANCEF) IV       LOS: 3 days    Time spent: More than 50% of that time was spent in counseling and/or coordination of care.      Antonieta Pert, MD [facility] Pager [phone]  If 7PM-7AM, please contact night-coverage [email or web address] Password [email or web address] 05/26/2018, 11:20 AM   Please Note:  This patient record was dictated using Editor, commissioning. Chart creation errors have been sought, but may not always have been located. Such creation errors do not reflect on the Standard of Medical Care.

## 2018-05-26 NOTE — Progress Notes (Signed)
PT Cancellation Note  Patient Details Name: Susanne Baumgarner MRN: [id number] DOB: 05/02/1981   Cancelled Treatment:    Reason Eval/Treat Not Completed: Other (comment). Will defer until after pericardial window.    Shary Decamp [place] 05/26/2018, 4:20 PM Suanne Marker PT Acute Rehabilitation Services Pager [phone] Office [phone]

## 2018-05-26 NOTE — Progress Notes (Signed)
Progress Note  Patient Name: Dawn Haynes Date of Encounter: 05/26/2018  Primary Cardiologist: Minus Breeding, MD   Subjective   She reports feeling good this morning. No complaints of chest pain, SOB, or palpitations. No new issues overnight.   Inpatient Medications    Scheduled Meds: . polyethylene glycol  17 g Oral Daily  . sodium chloride flush  3 mL Intravenous Q12H   Continuous Infusions: . sodium chloride    . [START ON 05/27/2018]  ceFAZolin (ANCEF) IV     PRN Meds: sodium chloride, acetaminophen **OR** acetaminophen, bisacodyl, HYDROcodone-acetaminophen, ondansetron **OR** ondansetron (ZOFRAN) IV, oxyCODONE, sodium chloride flush   Vital Signs    Vitals:   05/25/18 1945 05/25/18 2213 05/26/18 0516 05/26/18 0600  BP: 102/72  105/76 115/75  Pulse: (!) 118 99 (!) 110 (!) 108  Resp: 19  19   Temp: 100.3 F (37.9 C) 99.1 F (37.3 C) 99.6 F (37.6 C) 100 F (37.8 C)  TempSrc: Oral Axillary Oral Oral  SpO2: 100% 100% 100% 100%  Weight:      Height:        Intake/Output Summary (Last 24 hours) at 05/26/2018 0910 Last data filed at 05/26/2018 0447 Gross per 24 hour  Intake -  Output 700 ml  Net -700 ml   Filed Weights   05/24/18 0146  Weight: 55.1 kg    Telemetry    Sinus tachycardia/ NSR - Personally Reviewed  Physical Exam   GEN: Pleasant AAF sitting upright in bed in no acute distress.   Neck: No JVD, no carotid bruits Cardiac: mildly tachycardic, regular rhythm, no murmurs, rubs, or gallops.  Respiratory: Clear to auscultation bilaterally, no wheezes/ rales/ rhonchi GI: NABS, Soft, nontender, non-distended  MS: No edema; No deformity. Neuro:  Nonfocal, moving all extremities spontaneously Psych: Normal affect   Labs    Chemistry Recent Labs  Lab 05/24/18 0313 05/25/18 0344 05/26/18 0434  NA 131* 133* 134*  K 3.9 4.0 4.0  CL 97* 99 98  CO2 25 26 23   GLUCOSE 96 88 92  BUN 6 6 7   CREATININE 0.75 0.64 0.69  CALCIUM 8.3* 8.6* 8.5*    PROT 6.6  --  6.9  ALBUMIN 2.3* 2.3* 2.3*  AST 123*  --  128*  ALT 53*  --  53*  ALKPHOS 188*  --  189*  BILITOT 0.7  --  0.9  GFRNONAA >60 >60 >60  GFRAA >60 >60 >60  ANIONGAP 9 8 13      Hematology Recent Labs  Lab 05/24/18 0313 05/25/18 0344 05/26/18 0434  WBC 11.8* 13.0* 13.7*  RBC 3.20* 3.45* 3.43*  HGB 8.4* 9.2* 9.2*  HCT 28.1* 29.9* 29.5*  MCV 87.8 86.7 86.0  MCH 26.3 26.7 26.8  MCHC 29.9* 30.8 31.2  RDW 13.0 13.1 13.2  PLT 442* 454* 445*    Cardiac EnzymesNo results for input(s): TROPONINI in the last 168 hours.  Recent Labs  Lab 05/23/18 1620  TROPIPOC 0.00     BNPNo results for input(s): BNP, PROBNP in the last 168 hours.   DDimer No results for input(s): DDIMER in the last 168 hours.   Radiology    No results found.  Cardiac Studies   Echocardiogram 05/24/18: Study Conclusions  - Left ventricle: The cavity size was normal. Systolic function was   normal. The estimated ejection fraction was in the range of 60%   to 65%. Wall motion was normal; there were no regional wall   motion abnormalities. Doppler parameters are  consistent with   abnormal left ventricular relaxation (grade 1 diastolic   dysfunction). - Pericardium, extracardiac: A large, free-flowing pericardial   effusion was identified circumferential to the heart. The fluid   had no internal echoes.There was no evidence of hemodynamic   compromise.  Impressions:  - Although the pericardial effusion is very large, there is no   evidence of impending tamponade: the inferior vena cava is not   dilated and collapses appropriately with inspiration, the degree   of mitral and tricuspid respiratory flow variation is in   physiological range and there is no right ventricular diastolic   collapse. There is questionable right atrial collapse, but the   right atrium is suboptimally seen.  Patient Profile     37 y.o. female with PMH of breast CA presented with large pericardial effusion  and workup demonstrated multiple sites of metastasis. Turned down for pericardial window as she is a poor candidate for anesthesia and surgery.   Assessment & Plan    1. Pericardial effusion: Seen on CTA and echocardiogram this admission. No evidence of tamponade on echo. Likely malignant effusion 2/2 metastatic breast cancer. Initially turned down for pericardial window given elevated risk, however given posterior location she is not a candidate for pericardiocentesis. CT surgery re-evaluated and is planning for pericardial window 05/27/18. No evidence of tamponade today.  - NPO after MN for pericardial window 05/27/18 with CT surgery - Continue close monitoring for signs of tamponade  2. Metastatic breast cancer: initially diagnosed in 2016. Now with mets to lung, liver, bone, and spinal cord. Heme/Onc following and planning for palliative radiation to spine given spinal cord compression. Pending molecular testing to determine candidacy for immunotherapy vs systemic therapy. Poor prognosis.  - Continue management per Heme/Onc and primary team  3. HTN: no longer on medications with stable BPs - Continue to monitor   For questions or updates, please contact [place] Please consult [email or web address] for contact info under Cardiology/STEMI.      Signed, Abigail Butts, PA-C  05/26/2018, 9:10 AM   [phone]

## 2018-05-26 NOTE — Progress Notes (Signed)
IR aware of request for port placement. Chart reviewed. Pt scheduled for pericardial window tomorrow. Also to have radiation therapy prior to chemo/immuno. Can tentatively plan for port early next week. If chemo to be started imminently, consider PICC line.  Ascencion Dike PA-C Interventional Radiology 05/26/2018 2:08 PM

## 2018-05-26 NOTE — Progress Notes (Signed)
Patient seen and independently examined with Cherlynn Polo, PA. We discussed all aspects of the encounter. I agree with the assessment and plan as stated above.  She is doing well this morning.  She denies any chest pain or shortness of breath.  She has not felt any racing of her heartbeat.  GEN: Well nourished, well developed in no acute distress HEENT: Normal NECK: No JVD; No carotid bruits LYMPHATICS: No lymphadenopathy CARDIAC:RRR, no murmurs, rubs, gallops RESPIRATORY:  Clear to auscultation without rales, wheezing or rhonchi  ABDOMEN: Soft, non-tender, non-distended MUSCULOSKELETAL:  No edema; No deformity  SKIN: Warm and dry NEUROLOGIC:  Alert and oriented x 3 PSYCHIATRIC:  Normal affect   Reviewed echo films with Dr. Fletcher Anon yesterday.  Most of her effusion is more posterior than anterior and given her metastatic disease in her upper abdomen would make subxiphoid pericardiocentesis extremely difficult and high risk.  There is also a high likelihood that her effusion would reaccumulate.  I discussed this with Dr. Darcey Nora CVTS surgery and he is in agreement to proceed with pericardial window on Friday.  He is hemodynamically stable today without any evidence on exam of tamponade.

## 2018-05-27 ENCOUNTER — Ambulatory Visit: Payer: Medicaid Other

## 2018-05-27 ENCOUNTER — Encounter (HOSPITAL_COMMUNITY): Payer: Self-pay | Admitting: Certified Registered"

## 2018-05-27 ENCOUNTER — Inpatient Hospital Stay (HOSPITAL_COMMUNITY): Payer: Medicaid Other | Admitting: Anesthesiology

## 2018-05-27 ENCOUNTER — Encounter (HOSPITAL_COMMUNITY)
Admission: EM | Disposition: A | Discharge status: Home / Self Care | Payer: Self-pay | Source: Home / Self Care | Attending: Internal Medicine

## 2018-05-27 ENCOUNTER — Inpatient Hospital Stay (HOSPITAL_COMMUNITY): Payer: Medicaid Other

## 2018-05-27 ENCOUNTER — Ambulatory Visit: Admit: 2018-05-27 | Payer: Medicaid Other | Admitting: Cardiothoracic Surgery

## 2018-05-27 DIAGNOSIS — I313 Pericardial effusion (noninflammatory): Secondary | ICD-10-CM

## 2018-05-27 HISTORY — PX: SUBXYPHOID PERICARDIAL WINDOW: SHX5075

## 2018-05-27 HISTORY — PX: TEE WITHOUT CARDIOVERSION: SHX5443

## 2018-05-27 SURGERY — CREATION, PERICARDIAL WINDOW, SUBXIPHOID APPROACH
Anesthesia: General

## 2018-05-27 MED ORDER — ACETAMINOPHEN 10 MG/ML IV SOLN: 1000.0000 mg | Freq: Once | INTRAVENOUS | Status: DC | PRN

## 2018-05-27 MED ORDER — BISACODYL 5 MG PO TBEC
10.0000 mg | DELAYED_RELEASE_TABLET | Freq: Every day | ORAL | Status: DC
  Administered 2018-05-27 – 2018-05-30 (×4): 10 mg via ORAL
  Filled 2018-05-27 (×5): qty 2

## 2018-05-27 MED ORDER — EPHEDRINE 5 MG/ML INJ
INTRAVENOUS | Status: AC
  Filled 2018-05-27: qty 10

## 2018-05-27 MED ORDER — DEXAMETHASONE SODIUM PHOSPHATE 10 MG/ML IJ SOLN
INTRAMUSCULAR | Status: DC | PRN
  Administered 2018-05-27: 8 mg via INTRAVENOUS

## 2018-05-27 MED ORDER — MIDAZOLAM HCL 2 MG/2ML IJ SOLN
INTRAMUSCULAR | Status: AC
  Filled 2018-05-27: qty 2

## 2018-05-27 MED ORDER — DEXTROSE-NACL 5-0.9 % IV SOLN
INTRAVENOUS | Status: DC
  Administered 2018-05-28 – 2018-05-30 (×5): via INTRAVENOUS

## 2018-05-27 MED ORDER — CHLORHEXIDINE GLUCONATE CLOTH 2 % EX PADS: 6.0000 | MEDICATED_PAD | Freq: Every day | CUTANEOUS | Status: DC

## 2018-05-27 MED ORDER — ACETAMINOPHEN 500 MG PO TABS
1000.0000 mg | ORAL_TABLET | Freq: Four times a day (QID) | ORAL | Status: DC
  Administered 2018-05-27 – 2018-05-29 (×7): 1000 mg via ORAL
  Filled 2018-05-27 (×10): qty 2

## 2018-05-27 MED ORDER — PHENYLEPHRINE HCL 10 MG/ML IJ SOLN: INTRAMUSCULAR | Status: DC | PRN

## 2018-05-27 MED ORDER — LACTATED RINGERS IV SOLN
INTRAVENOUS | Status: DC | PRN
  Administered 2018-05-27: 06:00:00 via INTRAVENOUS

## 2018-05-27 MED ORDER — PROPOFOL 10 MG/ML IV BOLUS
INTRAVENOUS | Status: AC
  Filled 2018-05-27: qty 20

## 2018-05-27 MED ORDER — ACETAMINOPHEN 160 MG/5ML PO SOLN: 1000.0000 mg | Freq: Once | ORAL | Status: DC | PRN

## 2018-05-27 MED ORDER — LACTATED RINGERS IV SOLN
INTRAVENOUS | Status: DC | PRN
  Administered 2018-05-27: 07:00:00 via INTRAVENOUS

## 2018-05-27 MED ORDER — SODIUM CHLORIDE 0.9% FLUSH: 10.0000 mL | INTRAVENOUS | Status: DC | PRN

## 2018-05-27 MED ORDER — FENTANYL CITRATE (PF) 100 MCG/2ML IJ SOLN: 25.0000 ug | INTRAMUSCULAR | Status: DC | PRN

## 2018-05-27 MED ORDER — DEXAMETHASONE SODIUM PHOSPHATE 10 MG/ML IJ SOLN
INTRAMUSCULAR | Status: AC
  Filled 2018-05-27: qty 1

## 2018-05-27 MED ORDER — 0.9 % SODIUM CHLORIDE (POUR BTL) OPTIME
TOPICAL | Status: DC | PRN
  Administered 2018-05-27: 2000 mL

## 2018-05-27 MED ORDER — ACETAMINOPHEN 500 MG PO TABS: 1000.0000 mg | ORAL_TABLET | Freq: Once | ORAL | Status: DC | PRN

## 2018-05-27 MED ORDER — SUGAMMADEX SODIUM 200 MG/2ML IV SOLN
INTRAVENOUS | Status: AC
  Filled 2018-05-27: qty 2

## 2018-05-27 MED ORDER — LIDOCAINE 2% (20 MG/ML) 5 ML SYRINGE
INTRAMUSCULAR | Status: AC
  Filled 2018-05-27: qty 5

## 2018-05-27 MED ORDER — LIDOCAINE 2% (20 MG/ML) 5 ML SYRINGE
INTRAMUSCULAR | Status: DC | PRN
  Administered 2018-05-27: 40 mg via INTRAVENOUS

## 2018-05-27 MED ORDER — PHENYLEPHRINE 40 MCG/ML (10ML) SYRINGE FOR IV PUSH (FOR BLOOD PRESSURE SUPPORT)
PREFILLED_SYRINGE | INTRAVENOUS | Status: AC
  Filled 2018-05-27: qty 10

## 2018-05-27 MED ORDER — PROPOFOL 10 MG/ML IV BOLUS
INTRAVENOUS | Status: DC | PRN
  Administered 2018-05-27: 20 mg via INTRAVENOUS
  Administered 2018-05-27: 50 mg via INTRAVENOUS
  Administered 2018-05-27: 20 mg via INTRAVENOUS

## 2018-05-27 MED ORDER — POTASSIUM CHLORIDE 10 MEQ/50ML IV SOLN
10.0000 meq | Freq: Every day | INTRAVENOUS | Status: DC | PRN
  Filled 2018-05-27: qty 50

## 2018-05-27 MED ORDER — ONDANSETRON HCL 4 MG/2ML IJ SOLN
INTRAMUSCULAR | Status: DC | PRN
  Administered 2018-05-27: 4 mg via INTRAVENOUS

## 2018-05-27 MED ORDER — SODIUM CHLORIDE 0.9 % IV SOLN
INTRAVENOUS | Status: DC | PRN
  Administered 2018-05-27: 60 ug/min via INTRAVENOUS

## 2018-05-27 MED ORDER — OXYCODONE HCL 5 MG PO TABS: 5.0000 mg | ORAL_TABLET | Freq: Once | ORAL | Status: DC | PRN

## 2018-05-27 MED ORDER — CEFAZOLIN SODIUM-DEXTROSE 1-4 GM/50ML-% IV SOLN
1.0000 g | Freq: Three times a day (TID) | INTRAVENOUS | Status: AC
  Administered 2018-05-27 – 2018-05-28 (×2): 1 g via INTRAVENOUS
  Filled 2018-05-27 (×2): qty 50

## 2018-05-27 MED ORDER — ONDANSETRON HCL 4 MG/2ML IJ SOLN: 4.0000 mg | Freq: Four times a day (QID) | INTRAMUSCULAR | Status: DC | PRN

## 2018-05-27 MED ORDER — FENTANYL CITRATE (PF) 100 MCG/2ML IJ SOLN
INTRAMUSCULAR | Status: AC
  Filled 2018-05-27: qty 2

## 2018-05-27 MED ORDER — SENNOSIDES-DOCUSATE SODIUM 8.6-50 MG PO TABS
1.0000 | ORAL_TABLET | Freq: Every day | ORAL | Status: DC
  Administered 2018-05-27 – 2018-05-29 (×3): 1 via ORAL
  Filled 2018-05-27 (×3): qty 1

## 2018-05-27 MED ORDER — OXYCODONE HCL 5 MG/5ML PO SOLN: 5.0000 mg | Freq: Once | ORAL | Status: DC | PRN

## 2018-05-27 MED ORDER — ROCURONIUM BROMIDE 50 MG/5ML IV SOSY
PREFILLED_SYRINGE | INTRAVENOUS | Status: AC
  Filled 2018-05-27: qty 5

## 2018-05-27 MED ORDER — MIDAZOLAM HCL 5 MG/5ML IJ SOLN
INTRAMUSCULAR | Status: DC | PRN
  Administered 2018-05-27 (×2): 1 mg via INTRAVENOUS

## 2018-05-27 MED ORDER — FENTANYL CITRATE (PF) 250 MCG/5ML IJ SOLN
INTRAMUSCULAR | Status: AC
  Filled 2018-05-27: qty 5

## 2018-05-27 MED ORDER — KETOROLAC TROMETHAMINE 15 MG/ML IJ SOLN
15.0000 mg | Freq: Four times a day (QID) | INTRAMUSCULAR | Status: AC
  Administered 2018-05-27 – 2018-05-28 (×4): 15 mg via INTRAVENOUS
  Filled 2018-05-27 (×3): qty 1

## 2018-05-27 MED ORDER — ACETAMINOPHEN 160 MG/5ML PO SOLN
1000.0000 mg | Freq: Four times a day (QID) | ORAL | Status: DC
  Administered 2018-05-29 – 2018-05-31 (×8): 1000 mg via ORAL
  Filled 2018-05-27 (×8): qty 40.6

## 2018-05-27 MED ORDER — FENTANYL CITRATE (PF) 100 MCG/2ML IJ SOLN
25.0000 ug | INTRAMUSCULAR | Status: DC | PRN
  Administered 2018-05-27: 25 ug via INTRAVENOUS
  Administered 2018-05-27: 50 ug via INTRAVENOUS
  Administered 2018-05-27: 25 ug via INTRAVENOUS
  Administered 2018-05-27: 50 ug via INTRAVENOUS

## 2018-05-27 MED ORDER — ONDANSETRON HCL 4 MG/2ML IJ SOLN
INTRAMUSCULAR | Status: AC
  Filled 2018-05-27: qty 2

## 2018-05-27 MED ORDER — KETOROLAC TROMETHAMINE 15 MG/ML IJ SOLN
INTRAMUSCULAR | Status: AC
  Filled 2018-05-27: qty 1

## 2018-05-27 MED ORDER — PHENYLEPHRINE 40 MCG/ML (10ML) SYRINGE FOR IV PUSH (FOR BLOOD PRESSURE SUPPORT)
PREFILLED_SYRINGE | INTRAVENOUS | Status: DC | PRN
  Administered 2018-05-27 (×2): 80 ug via INTRAVENOUS
  Administered 2018-05-27 (×2): 40 ug via INTRAVENOUS

## 2018-05-27 MED ORDER — CEFAZOLIN SODIUM 1 G IJ SOLR
INTRAMUSCULAR | Status: AC
  Filled 2018-05-27: qty 20

## 2018-05-27 MED ORDER — SUGAMMADEX SODIUM 200 MG/2ML IV SOLN
INTRAVENOUS | Status: DC | PRN
  Administered 2018-05-27: 150 mg via INTRAVENOUS

## 2018-05-27 MED ORDER — FENTANYL CITRATE (PF) 100 MCG/2ML IJ SOLN
INTRAMUSCULAR | Status: DC | PRN
  Administered 2018-05-27 (×4): 50 ug via INTRAVENOUS

## 2018-05-27 MED ORDER — ROCURONIUM BROMIDE 50 MG/5ML IV SOSY
PREFILLED_SYRINGE | INTRAVENOUS | Status: DC | PRN
  Administered 2018-05-27: 10 mg via INTRAVENOUS
  Administered 2018-05-27: 40 mg via INTRAVENOUS

## 2018-05-27 SURGICAL SUPPLY — 58 items
BENZOIN TINCTURE PRP APPL 2/3 (GAUZE/BANDAGES/DRESSINGS) ×3 IMPLANT
CANISTER SUCT 3000ML PPV (MISCELLANEOUS) ×3 IMPLANT
CATH THORACIC 28FR (CATHETERS) IMPLANT
CATH THORACIC 28FR RT ANG (CATHETERS) IMPLANT
CATH THORACIC 36FR (CATHETERS) IMPLANT
CATH THORACIC 36FR RT ANG (CATHETERS) IMPLANT
CLOSURE WOUND 1/2 X4 (GAUZE/BANDAGES/DRESSINGS) ×1
CONN ST 1/4X3/8  BEN (MISCELLANEOUS) ×2
CONN ST 1/4X3/8 BEN (MISCELLANEOUS) ×1 IMPLANT
CONT SPEC 4OZ CLIKSEAL STRL BL (MISCELLANEOUS) IMPLANT
COVER SURGICAL LIGHT HANDLE (MISCELLANEOUS) IMPLANT
COVER WAND RF STERILE (DRAPES) ×3 IMPLANT
DRAIN CHANNEL 28F RND 3/8 FF (WOUND CARE) IMPLANT
DRAPE HALF SHEET 40X57 (DRAPES) ×3 IMPLANT
DRAPE LAPAROSCOPIC ABDOMINAL (DRAPES) ×3 IMPLANT
ELECT BLADE 4.0 EZ CLEAN MEGAD (MISCELLANEOUS) ×3
ELECT BLADE 6.5 EXT (BLADE) ×3 IMPLANT
ELECT REM PT RETURN 9FT ADLT (ELECTROSURGICAL) ×3
ELECTRODE BLDE 4.0 EZ CLN MEGD (MISCELLANEOUS) ×1 IMPLANT
ELECTRODE REM PT RTRN 9FT ADLT (ELECTROSURGICAL) ×1 IMPLANT
GAUZE SPONGE 4X4 12PLY STRL (GAUZE/BANDAGES/DRESSINGS) ×3 IMPLANT
GAUZE SPONGE 4X4 12PLY STRL LF (GAUZE/BANDAGES/DRESSINGS) ×3 IMPLANT
GLOVE BIO SURGEON STRL SZ7.5 (GLOVE) ×6 IMPLANT
GLOVE BIOGEL M 6.5 STRL (GLOVE) ×6 IMPLANT
GLOVE BIOGEL PI IND STRL 6.5 (GLOVE) ×1 IMPLANT
GLOVE BIOGEL PI IND STRL 7.5 (GLOVE) ×1 IMPLANT
GLOVE BIOGEL PI INDICATOR 6.5 (GLOVE) ×2
GLOVE BIOGEL PI INDICATOR 7.5 (GLOVE) ×2
GOWN STRL REUS W/ TWL LRG LVL3 (GOWN DISPOSABLE) ×3 IMPLANT
GOWN STRL REUS W/TWL LRG LVL3 (GOWN DISPOSABLE) ×6
HEMOSTAT POWDER SURGIFOAM 1G (HEMOSTASIS) IMPLANT
KIT BASIN OR (CUSTOM PROCEDURE TRAY) ×3 IMPLANT
KIT TURNOVER KIT B (KITS) ×3 IMPLANT
NS IRRIG 1000ML POUR BTL (IV SOLUTION) ×3 IMPLANT
PACK CHEST (CUSTOM PROCEDURE TRAY) ×3 IMPLANT
PAD ARMBOARD 7.5X6 YLW CONV (MISCELLANEOUS) ×6 IMPLANT
PAD ELECT DEFIB RADIOL ZOLL (MISCELLANEOUS) ×3 IMPLANT
STRIP CLOSURE SKIN 1/2X4 (GAUZE/BANDAGES/DRESSINGS) ×2 IMPLANT
SUT SILK  1 MH (SUTURE) ×2
SUT SILK 1 MH (SUTURE) ×1 IMPLANT
SUT SILK 2 0 SH CR/8 (SUTURE) ×3 IMPLANT
SUT VIC AB 1 CTX 18 (SUTURE) ×3 IMPLANT
SUT VIC AB 1 CTX 36 (SUTURE) ×2
SUT VIC AB 1 CTX36XBRD ANBCTR (SUTURE) ×1 IMPLANT
SUT VIC AB 2-0 CTX 27 (SUTURE) ×3 IMPLANT
SUT VIC AB 3-0 X1 27 (SUTURE) ×3 IMPLANT
SWAB COLLECTION DEVICE MRSA (MISCELLANEOUS) IMPLANT
SWAB CULTURE ESWAB REG 1ML (MISCELLANEOUS) IMPLANT
SYR 10ML LL (SYRINGE) IMPLANT
SYR 50ML SLIP (SYRINGE) IMPLANT
SYSTEM SAHARA CHEST DRAIN ATS (WOUND CARE) IMPLANT
TAPE CLOTH SURG 4X10 WHT LF (GAUZE/BANDAGES/DRESSINGS) ×3 IMPLANT
TOWEL GREEN STERILE (TOWEL DISPOSABLE) ×3 IMPLANT
TOWEL GREEN STERILE FF (TOWEL DISPOSABLE) ×3 IMPLANT
TRAP SPECIMEN MUCOUS 40CC (MISCELLANEOUS) IMPLANT
TRAY FOLEY MTR SLVR 14FR STAT (SET/KITS/TRAYS/PACK) IMPLANT
TRAY FOLEY SLVR 16FR TEMP STAT (SET/KITS/TRAYS/PACK) ×3 IMPLANT
WATER STERILE IRR 1000ML POUR (IV SOLUTION) ×6 IMPLANT

## 2018-05-27 NOTE — Anesthesia Preprocedure Evaluation (Signed)
Anesthesia Evaluation  Patient identified by MRN, date of birth, ID band Patient awake    Reviewed: Allergy & Precautions, NPO status , Patient's Chart, lab work & pertinent test results  History of Anesthesia Complications Negative for: history of anesthetic complications  Airway Mallampati: II  TM Distance: >3 FB Neck ROM: Full    Dental  (+) Teeth Intact, Dental Advisory Given   Pulmonary neg pulmonary ROS,    breath sounds clear to auscultation       Cardiovascular  Rhythm:Regular Rate:Tachycardia  Pericardial effusion   Neuro/Psych    GI/Hepatic negative GI ROS, Neg liver ROS,   Endo/Other  negative endocrine ROS  Renal/GU negative Renal ROS     Musculoskeletal  (+) Arthritis ,   Abdominal   Peds  Hematology  (+) anemia ,   Anesthesia Other Findings Metastatic Left breast cancer 2016. Mastectomy with chemo/radiation.  Reproductive/Obstetrics                             Anesthesia Physical Anesthesia Plan  ASA: IV  Anesthesia Plan: General   Post-op Pain Management:    Induction: Intravenous  PONV Risk Score and Plan: 3 and Ondansetron and Dexamethasone  Airway Management Planned: Oral ETT  Additional Equipment: Arterial line, CVP, Ultrasound Guidance Line Placement and TEE  Intra-op Plan:   Post-operative Plan: Extubation in OR  Informed Consent: I have reviewed the patients History and Physical, chart, labs and discussed the procedure including the risks, benefits and alternatives for the proposed anesthesia with the patient or authorized representative who has indicated his/her understanding and acceptance.   Dental advisory given  Plan Discussed with: CRNA and Surgeon  Anesthesia Plan Comments:         Anesthesia Quick Evaluation

## 2018-05-27 NOTE — Transfer of Care (Signed)
Immediate Anesthesia Transfer of Care Note  Patient: Dawn Haynes  Procedure(s) Performed: SUBXYPHOID PERICARDIAL WINDOW (N/A ) TRANSESOPHAGEAL ECHOCARDIOGRAM (TEE) (N/A )  Patient Location: PACU  Anesthesia Type:General  Level of Consciousness: awake and patient cooperative  Airway & Oxygen Therapy: Patient Spontanous Breathing and Patient connected to face mask oxygen  Post-op Assessment: Report given to RN and Post -op Vital signs reviewed and stable  Post vital signs: Reviewed and stable  Last Vitals:  Vitals Value Taken Time  BP 114/78 05/27/2018  9:08 AM  Temp    Pulse 117 05/27/2018  9:10 AM  Resp 20 05/27/2018  9:10 AM  SpO2 100 % 05/27/2018  9:10 AM  Vitals shown include unvalidated device data.  Last Pain:  Vitals:   05/27/18 0416  TempSrc: Oral  PainSc:       Patients Stated Pain Goal: 0 (26/33/35 4562)  Complications: No apparent anesthesia complications

## 2018-05-27 NOTE — Op Note (Signed)
Dawn Haynes, Dawn Haynes MEDICAL RECORD [id number] ACCOUNT [id number] DATE OF BIRTH:05-10-1981 FACILITY: [facility] LOCATION: [facility] PHYSICIAN:Karlos Scadden VAN TRIGT III, MD  OPERATIVE REPORT  DATE OF PROCEDURE:  05/27/2018  OPERATION:  Subxiphoid pericardial window and drainage of pericardial effusion 450 mL of clear fluid.  SURGEON:  Len Childs, MD  ASSISTANT:  Alcide Evener, RNFA.  ANESTHESIA:  General by Dr. Oleta Mouse.   PREOPERATIVE DIAGNOSIS:  Large pericardial effusion, history of metastatic breast cancer, history of radiation to the thoracic spine.  POSTOPERATIVE DIAGNOSIS:  Large pericardial effusion, history of metastatic breast cancer, history of radiation to the thoracic spine.  CLINICAL NOTE:  The patient is an unfortunate, but very nice 37 year old female with a history of metastatic breast cancer.  Last month, she underwent partial sternotomy and resection of tumor in her thoracic spine with reconstruction of the thoracic  spine by Dr. Arnoldo Morale.  She recently was admitted with shortness of breath and found to have a large pericardial effusion.  LV function was preserved.  There was no evidence of tamponade.  Thoracic surgical evaluation for pericardial window was placed.  I  agreed that a pericardial window would be the best long-term therapy of her pericardial effusion, but would be at high risk because of the large amount of tumor in her chest and abdomen.  I discussed the procedure in detail with the patient and her  family including the increased risk of this procedure under general anesthesia because of her advanced-stage cancer.  She understood the procedure, the incision, the use of general anesthesia and the expected recovery and agreed to proceed with surgery  under what I felt was informed consent.  OPERATIVE FINDINGS: 1.  450 mL of fluid removed clear. 2.  No significant thickening of the pericardium. 3.  Epicardium without evidence of tumor or nodularity. 4.   Frozen section of a section of the pericardial tissue was negative for malignancy.  DESCRIPTION OF PROCEDURE:  The patient was brought from the preop holding area where informed consent was documented and all final questions addressed.  She was placed supine on the operating table and general anesthesia was induced.  She remained  stable.  A transesophageal echo probe was placed by the anesthesia team, which documented the large pericardial effusion.  The chest and abdomen were prepped and draped as a sterile field and a proper time-out was performed.  A small incision was made on  the area of the xiphoid process, which was then excised.  The soft tissue into the distal sternum was dissected, and the sternum was elevated with an elevating retractor.  The anterior pericardium was identified.  An incision was made with a #15 blade  and fluid under pressure was drained.  This was clear and totaled 450 mL.  A section of the anterior pericardium measuring approximately 3 cm was removed -- pericardial window.  This was sent for pathology.  The anterior surface of the epicardium was identified and there was no evidence of nodularity or cancer.  After the fluid was completely drained, a 28 French soft Bard catheter was placed in the dependent portion of the pericardium and brought out  through a separate incision and secured to the skin.  The retractor was then removed.  Hemostasis was achieved.  The fascia was closed with interrupted #1 Vicryl.  Subcutaneous and skin layers were closed with running Vicryl and sterile dressings were applied.  The chest tube was connected to underwater seal Pleur-Evac drainage system.   The patient  was reversed from anesthesia and returned to recovery room in stable condition.  TN/NUANCE  D:05/27/2018 T:05/27/2018 JOB:[id number]

## 2018-05-27 NOTE — Brief Op Note (Signed)
05/27/2018  9:12 AM  PATIENT:  Dawn Haynes  37 y.o. female  PRE-OPERATIVE DIAGNOSIS:  PERICARDIAL EFFUSION, hx breast cancer  POST-OPERATIVE DIAGNOSIS:  PERICARDIAL EFFUSION  PROCEDURE:  Procedure(s): SUBXYPHOID PERICARDIAL WINDOW (N/A) TRANSESOPHAGEAL ECHOCARDIOGRAM (TEE) (N/A)  Drainage of 450 cc pericardial fluid - clear Frozen section of pericardial tissue-- inflammation, no malignancy  SURGEON:  Surgeon(s) and Role:    Ivin Poot, MD - Primary  PHYSICIAN ASSISTANT:   ASSISTANTS: Jonetta Speak , RNFA  ANESTHESIA:   general  EBL:  10 cc  BLOOD ADMINISTERED:none  DRAINS: 17 F Blake drain in pericardium   LOCAL MEDICATIONS USED:  NONE  SPECIMEN:  Source of Specimen:  pericardial tissue and fluid  DISPOSITION OF SPECIMEN:  PATHOLOGY  COUNTS:  YES  TOURNIQUET:  * No tourniquets in log *  DICTATION: .Dragon Dictation  PLAN OF CARE:  to ICU from PACU  PATIENT DISPOSITION:  PACU - hemodynamically stable.   Delay start of Pharmacological VTE agent (>24hrs) due to surgical blood loss or risk of bleeding: yes

## 2018-05-27 NOTE — Anesthesia Procedure Notes (Signed)
Procedure Name: Intubation Date/Time: 05/27/2018 7:50 AM Performed by: Orlie Dakin, CRNA Pre-anesthesia Checklist: Patient identified, Emergency Drugs available, Suction available and Patient being monitored Patient Re-evaluated:Patient Re-evaluated prior to induction Oxygen Delivery Method: Circle system utilized Preoxygenation: Pre-oxygenation with 100% oxygen Induction Type: IV induction Ventilation: Mask ventilation without difficulty Laryngoscope Size: Miller and 3 Grade View: Grade II Tube type: Oral Tube size: 7.0 mm Number of attempts: 1 Airway Equipment and Method: Stylet Placement Confirmation: ETT inserted through vocal cords under direct vision,  positive ETCO2 and breath sounds checked- equal and bilateral Secured at: 22 cm Tube secured with: Tape Dental Injury: Teeth and Oropharynx as per pre-operative assessment  Comments: 4x4s bite block used at end of case prior to extubation.

## 2018-05-27 NOTE — Progress Notes (Signed)
  Echocardiogram Echocardiogram Transesophageal has been performed.  Dawn Haynes 05/27/2018, 8:17 AM

## 2018-05-27 NOTE — Progress Notes (Signed)
Pre Procedure note for inpatients:   Dawn Haynes has been scheduled for Procedure(s): SUBXYPHOID PERICARDIAL WINDOW (N/A) TRANSESOPHAGEAL ECHOCARDIOGRAM (TEE) (N/A) today. The various methods of treatment have been discussed with the patient. After consideration of the risks, benefits and treatment options the patient has consented to the planned procedure.   The patient has been seen and labs reviewed. There are no changes in the patient's condition to prevent proceeding with the planned procedure today.  Recent labs:  Lab Results  Component Value Date   WBC 13.7 (H) 05/26/2018   HGB 9.2 (L) 05/26/2018   HCT 29.5 (L) 05/26/2018   PLT 445 (H) 05/26/2018   GLUCOSE 92 05/26/2018   ALT 53 (H) 05/26/2018   AST 128 (H) 05/26/2018   NA 134 (L) 05/26/2018   K 4.0 05/26/2018   CL 98 05/26/2018   CREATININE 0.69 05/26/2018   BUN 7 05/26/2018   CO2 23 05/26/2018   TSH 2.192 05/24/2018   INR 1.08 05/25/2018    Ivin Poot III, MD 05/27/2018 7:07 AM

## 2018-05-27 NOTE — Anesthesia Procedure Notes (Signed)
Arterial Line Insertion Start/End11/15/2019 7:15 AM, 05/27/2018 7:20 AM Performed by: Lavell Luster, CRNA, CRNA  Patient location: Pre-op. Preanesthetic checklist: patient identified, IV checked, site marked, risks and benefits discussed, surgical consent, monitors and equipment checked and pre-op evaluation Lidocaine 1% used for infiltration and patient sedated Right, radial was placed Catheter size: 20 G Hand hygiene performed  and maximum sterile barriers used   Attempts: 3 Procedure performed using ultrasound guided technique. Ultrasound Notes:anatomy identified, needle tip was noted to be adjacent to the nerve/plexus identified and no ultrasound evidence of intravascular and/or intraneural injection Following insertion, dressing applied and Biopatch. Post procedure assessment: normal  Patient tolerated the procedure well with no immediate complications. Additional procedure comments: Right radial arterial line attempted x2 Consepcion Utt, CRNA. Successful placement with ultrasound guidance per Leonides Sake, CRNA.Marland Kitchen

## 2018-05-27 NOTE — Progress Notes (Signed)
Patient went for OR today early morning prior to be seen. She is in ICU post op. I discussed with cardiothoracic surgeon Dr Prescott Gum- patient will be in ICU status for few days postop and he will be the attending. Hospitalist team will sign off. Please call if needed.

## 2018-05-27 NOTE — Progress Notes (Signed)
Dr. Ermalene Postin aware of increased HR in the 120s. No new orders given.

## 2018-05-27 NOTE — Progress Notes (Signed)
Patient ID: Dawn Haynes, female   DOB: 01-20-1981, 37 y.o.   MRN: [id number] EVENING ROUNDS NOTE :     [address]       [place] [address]             [phone]                 Day of Surgery Procedure(s) (LRB): SUBXYPHOID PERICARDIAL WINDOW (N/A) TRANSESOPHAGEAL ECHOCARDIOGRAM (TEE) (N/A)  Total Length of Stay:  LOS: 4 days  BP 98/70   Pulse (!) 102   Temp 98.4 F (36.9 C)   Resp (!) 32   Ht 5\' 3"  (1.6 m)   Wt 55.1 kg   LMP 05/11/2018 (Approximate)   SpO2 100%   BMI 21.52 kg/m   .Intake/Output      11/14 0701 - 11/15 0700 11/15 0701 - 11/16 0700   P.O. 400    I.V. (mL/kg)  875.1 (15.9)   Other  75   Total Intake(mL/kg) 400 (7.3) 950.1 (17.2)   Urine (mL/kg/hr) 200 (0.2) 235 (0.5)   Other  450   Chest Tube  45   Total Output 200 730   Net +200 +220.1          . sodium chloride    .  ceFAZolin (ANCEF) IV    . dextrose 5 % and 0.9% NaCl 40 mL/hr at 05/27/18 1400  . potassium chloride       Lab Results  Component Value Date   WBC 13.7 (H) 05/26/2018   HGB 9.2 (L) 05/26/2018   HCT 29.5 (L) 05/26/2018   PLT 445 (H) 05/26/2018   GLUCOSE 92 05/26/2018   ALT 53 (H) 05/26/2018   AST 128 (H) 05/26/2018   NA 134 (L) 05/26/2018   K 4.0 05/26/2018   CL 98 05/26/2018   CREATININE 0.69 05/26/2018   BUN 7 05/26/2018   CO2 23 05/26/2018   TSH 2.192 05/24/2018   INR 1.08 05/25/2018   Drainage today pericardium Patient notes improvement  Able to talk without being SOB   [place] [phone] Office [phone] 05/27/2018 4:07 PM

## 2018-05-28 ENCOUNTER — Inpatient Hospital Stay (HOSPITAL_COMMUNITY): Payer: Medicaid Other

## 2018-05-28 ENCOUNTER — Encounter (HOSPITAL_COMMUNITY): Payer: Self-pay | Admitting: Cardiothoracic Surgery

## 2018-05-28 LAB — BASIC METABOLIC PANEL
Anion gap: 7 (ref 5–15)
BUN: 10 mg/dL (ref 6–20)
CO2: 27 mmol/L (ref 22–32)
Calcium: 8.4 mg/dL — ABNORMAL LOW (ref 8.9–10.3)
Chloride: 101 mmol/L (ref 98–111)
Creatinine, Ser: <0.3 mg/dL — ABNORMAL LOW (ref 0.44–1.00)
Glucose, Bld: 93 mg/dL (ref 70–99)
Potassium: 4.1 mmol/L (ref 3.5–5.1)
Sodium: 135 mmol/L (ref 135–145)

## 2018-05-28 LAB — POCT I-STAT 3, ART BLOOD GAS (G3+)
Acid-Base Excess: 1 mmol/L (ref 0.0–2.0)
Bicarbonate: 25.7 mmol/L (ref 20.0–28.0)
O2 SAT: 98 %
PCO2 ART: 38.5 mmHg (ref 32.0–48.0)
PO2 ART: 106 mmHg (ref 83.0–108.0)
Patient temperature: 98.6
TCO2: 27 mmol/L (ref 22–32)
pH, Arterial: 7.433 (ref 7.350–7.450)

## 2018-05-28 LAB — CBC
HCT: 29.4 % — ABNORMAL LOW (ref 36.0–46.0)
Hemoglobin: 8.6 g/dL — ABNORMAL LOW (ref 12.0–15.0)
MCH: 25.8 pg — ABNORMAL LOW (ref 26.0–34.0)
MCHC: 29.3 g/dL — ABNORMAL LOW (ref 30.0–36.0)
MCV: 88.3 fL (ref 80.0–100.0)
Platelets: 497 10*3/uL — ABNORMAL HIGH (ref 150–400)
RBC: 3.33 MIL/uL — ABNORMAL LOW (ref 3.87–5.11)
RDW: 13.2 % (ref 11.5–15.5)
WBC: 19.1 10*3/uL — ABNORMAL HIGH (ref 4.0–10.5)
nRBC: 0 % (ref 0.0–0.2)

## 2018-05-28 NOTE — Progress Notes (Signed)
Patient ID: Dawn Haynes, female   DOB: 11/16/1980, 37 y.o.   MRN: [id number] [facility]CTS DAILY ICU PROGRESS NOTE                   [address]            [place] [address]          [phone]   1 Day Post-Op Procedure(s) (LRB): SUBXYPHOID PERICARDIAL WINDOW (N/A) TRANSESOPHAGEAL ECHOCARDIOGRAM (TEE) (N/A)  Total Length of Stay:  LOS: 5 days   Subjective: Patient walked around the unit this morning  Objective: Vital signs in last 24 hours: Temp:  [97.5 F (36.4 C)-99.1 F (37.3 C)] 98.6 F (37 C) (11/16 0900) Pulse Rate:  [80-120] 89 (11/16 0900) Cardiac Rhythm: Normal sinus rhythm (11/16 0800) Resp:  [14-32] 27 (11/16 0900) BP: (92-115)/(58-85) 103/79 (11/16 0900) SpO2:  [98 %-100 %] 100 % (11/16 0900) Weight:  [52.8 kg] 52.8 kg (11/16 0600)  Filed Weights   05/24/18 0146 05/28/18 0600  Weight: 55.1 kg 52.8 kg    Weight change:    Hemodynamic parameters for last 24 hours:    Intake/Output from previous day: 11/15 0701 - 11/16 0700 In: 1728.4 [I.V.:1553.5; IV Piggyback:99.9] Out: 1562 [Urine:1045; Chest Tube:67]  Intake/Output this shift: Total I/O In: 280 [P.O.:240; I.V.:40] Out: 40 [Urine:30; Chest Tube:10]  Current Meds: Scheduled Meds: . acetaminophen  1,000 mg Oral Q6H   Or  . acetaminophen (TYLENOL) oral liquid 160 mg/5 mL  1,000 mg Oral Q6H  . bisacodyl  10 mg Oral Daily  . Chlorhexidine Gluconate Cloth  6 each Topical Daily  . polyethylene glycol  17 g Oral Daily  . senna-docusate  1 tablet Oral QHS  . sodium chloride flush  3 mL Intravenous Q12H   Continuous Infusions: . sodium chloride    . dextrose 5 % and 0.9% NaCl 40 mL/hr at 05/28/18 0800  . potassium chloride     PRN Meds:.sodium chloride, acetaminophen **OR** [DISCONTINUED] acetaminophen, fentaNYL (SUBLIMAZE) injection, HYDROcodone-acetaminophen, ondansetron **OR** ondansetron (ZOFRAN) IV, oxyCODONE, potassium chloride, sodium chloride flush, sodium chloride flush  General  appearance: alert, cooperative and no distress Neurologic: intact Heart: regular rate and rhythm, S1, S2 normal, no murmur, click, rub or gallop Lungs: diminished breath sounds bibasilar Abdomen: soft, non-tender; bowel sounds normal; no masses,  no organomegaly Extremities: extremities normal, atraumatic, no cyanosis or edema and Homans sign is negative, no sign of DVT Wound: Drain in place minimal output since initially placed  Lab Results: CBC: Recent Labs    05/26/18 0434 05/28/18 0426  WBC 13.7* 19.1*  HGB 9.2* 8.6*  HCT 29.5* 29.4*  PLT 445* 497*   BMET:  Recent Labs    05/26/18 0434 05/28/18 0426  NA 134* 135  K 4.0 4.1  CL 98 101  CO2 23 27  GLUCOSE 92 93  BUN 7 10  CREATININE 0.69 <0.30*  CALCIUM 8.5* 8.4*    CMET: Lab Results  Component Value Date   WBC 19.1 (H) 05/28/2018   HGB 8.6 (L) 05/28/2018   HCT 29.4 (L) 05/28/2018   PLT 497 (H) 05/28/2018   GLUCOSE 93 05/28/2018   ALT 53 (H) 05/26/2018   AST 128 (H) 05/26/2018   NA 135 05/28/2018   K 4.1 05/28/2018   CL 101 05/28/2018   CREATININE <0.30 (L) 05/28/2018   BUN 10 05/28/2018   CO2 27 05/28/2018   TSH 2.192 05/24/2018   INR 1.08 05/25/2018      PT/INR: No  results for input(s): LABPROT, INR in the last 72 hours. Radiology: Dg Chest Port 1 View  Result Date: 05/27/2018 CLINICAL DATA:  Pericardial effusion EXAM: PORTABLE CHEST 1 VIEW COMPARISON:  05/23/2018 FINDINGS: Right IJ central line tip SVC RA junction. Pericardial drain in place. Improvement in the cardiac silhouette. No focal airspace process, collapse or consolidation. No edema, effusion or large pneumothorax. Postop changes from previous median sternotomy and upper thoracic fusion. Breast implants noted bilaterally. IMPRESSION: Improvement in the cardiac silhouette following pericardial drain for the large pericardial effusion. Normal heart size and vascularity. No developing acute airspace process or large effusion Electronically Signed    By: Jerilynn Mages.  Shick M.D.   On: 05/27/2018 09:53     Assessment/Plan: S/P Procedure(s) (LRB): SUBXYPHOID PERICARDIAL WINDOW (N/A) TRANSESOPHAGEAL ECHOCARDIOGRAM (TEE) (N/A) Mobilize d/c tubes/lines Plan removal of pericardial tube, transfer to stepdown    Grace Isaac 05/28/2018 9:25 AM

## 2018-05-28 NOTE — Evaluation (Signed)
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  She was seen recently in cardiology office 05/19/2018 1 to be tachycardic short of breath in setting of pericardial effusion. Patient presents now s/p subxyphoid pericardial window.  Clinical Impression  Orders received for PT evaluation. Patient demonstrates deficits in functional mobility as indicated below. Will benefit from continued skilled PT to address deficits and maximize function. Will see as indicated and progress as tolerated.  Patient mobilizing well with stable VS on room air during mobility. Anticipate patient will progress well, encouraged continued mobility with nsg staff.    Follow Up Recommendations No PT follow up;Supervision - Intermittent    Equipment Recommendations  None recommended by PT    Recommendations for Other Services       Precautions / Restrictions Precautions Precautions: (chest tube in place) Precaution Comments: recent sternal and back      Mobility  Bed Mobility               General bed mobility comments: received in chair  Transfers Overall transfer level: Needs assistance Equipment used: Ambulation equipment used(eva walker) Transfers: Sit to/from Stand Sit to Stand: Supervision         General transfer comment: supervision for line management  Ambulation/Gait Ambulation/Gait assistance: Supervision Gait Distance (Feet): 380 Feet Assistive device: (eval walker) Gait Pattern/deviations: WFL(Within Functional Limits);Step-through pattern   Gait velocity interpretation: 1.31 - 2.62 ft/sec, indicative of limited community  ambulator General Gait Details: patient with good speed and activity tolerance on room air  Stairs            Wheelchair Mobility    Modified Rankin (Stroke Patients Only)       Balance Overall balance assessment: Mild deficits observed, not formally tested                                           Pertinent Vitals/Pain Pain Assessment: No/denies pain    Home Living Family/patient expects to be discharged to:: Private residence Living Arrangements: Children Available Help at Discharge: Family Type of Home: House Home Access: Stairs to enter Entrance Stairs-Rails: Psychiatric nurse of Steps: several Home Layout: Two level;Bed/bath upstairs Home Equipment: None      Prior Function Level of Independence: Independent         Comments: drives for work, managed household     Hand Dominance   Dominant Hand: Right    Extremity/Trunk Assessment   Upper Extremity Assessment Upper Extremity Assessment: Overall WFL for tasks assessed    Lower Extremity Assessment Lower Extremity Assessment: Overall WFL for tasks assessed       Communication   Communication: No difficulties  Cognition Arousal/Alertness: Awake/alert Behavior During Therapy: WFL for tasks assessed/performed Overall Cognitive Status: Within Functional Limits for tasks assessed                                        General Comments      Exercises     Assessment/Plan    PT Assessment Patient needs continued PT  services  PT Problem List Decreased strength;Decreased activity tolerance;Decreased mobility;Decreased balance       PT Treatment Interventions DME instruction;Gait training;Stair training;Functional mobility training;Therapeutic activities;Therapeutic exercise;Balance training;Patient/family education    PT Goals (Current goals can be found in the Care Plan section)  Acute Rehab PT Goals Patient Stated Goal: to go home PT  Goal Formulation: With patient Time For Goal Achievement: 06/11/18 Potential to Achieve Goals: Good    Frequency Min 3X/week   Barriers to discharge        Co-evaluation               AM-PAC PT "6 Clicks" Daily Activity  Outcome Measure Difficulty turning over in bed (including adjusting bedclothes, sheets and blankets)?: A Little Difficulty moving from lying on back to sitting on the side of the bed? : A Little Difficulty sitting down on and standing up from a chair with arms (e.g., wheelchair, bedside commode, etc,.)?: A Little Help needed moving to and from a bed to chair (including a wheelchair)?: A Little Help needed walking in hospital room?: A Little Help needed climbing 3-5 steps with a railing? : A Little 6 Click Score: 18    End of Session   Activity Tolerance: Patient tolerated treatment well Patient left: in chair;with call bell/phone within reach Nurse Communication: Mobility status PT Visit Diagnosis: Difficulty in walking, not elsewhere classified (R26.2)    Time: 1216-2446 PT Time Calculation (min) (ACUTE ONLY): 17 min   Charges:   PT Evaluation $PT Eval Moderate Complexity: 1 Mod          Alben Deeds, PT DPT  Board Certified Neurologic Specialist Acute Rehabilitation Services Pager [phone] Office [phone]   Duncan Dull 05/28/2018, 9:44 AM

## 2018-05-28 NOTE — Progress Notes (Signed)
I received a request from the nurse to visit the patient and her family. I visited the patient's room with her husband and three children present. I introduced myself as the Chaplain who serves as a part of her medical team and that I was available for spiritual care as needed or requested. I shared that I would be returning to visit with them at a later date and also whenever they needed the Chaplain.    05/28/18 1800  Clinical Encounter Type  Visited With Patient and family together  Visit Type Initial;Spiritual support  Referral From Nurse  Consult/Referral To Chaplain  Stress Factors  Patient Stress Factors Exhausted  Family Stress Factors Exhausted    Chaplain Dr Redgie Grayer

## 2018-05-29 ENCOUNTER — Inpatient Hospital Stay (HOSPITAL_COMMUNITY): Payer: Medicaid Other

## 2018-05-29 LAB — COMPREHENSIVE METABOLIC PANEL
ALT: 46 U/L — ABNORMAL HIGH (ref 0–44)
AST: 133 U/L — ABNORMAL HIGH (ref 15–41)
Albumin: 2.1 g/dL — ABNORMAL LOW (ref 3.5–5.0)
Alkaline Phosphatase: 254 U/L — ABNORMAL HIGH (ref 38–126)
Anion gap: 10 (ref 5–15)
BUN: 13 mg/dL (ref 6–20)
CO2: 25 mmol/L (ref 22–32)
Calcium: 8.5 mg/dL — ABNORMAL LOW (ref 8.9–10.3)
Chloride: 101 mmol/L (ref 98–111)
Creatinine, Ser: 0.64 mg/dL (ref 0.44–1.00)
GFR calc Af Amer: >60 mL/min (ref >60)
GFR calc non Af Amer: >60 mL/min (ref >60)
Glucose, Bld: 85 mg/dL (ref 70–99)
Potassium: 4.2 mmol/L (ref 3.5–5.1)
Sodium: 136 mmol/L (ref 135–145)
Total Bilirubin: 0.8 mg/dL (ref 0.3–1.2)
Total Protein: 6.4 g/dL — ABNORMAL LOW (ref 6.5–8.1)

## 2018-05-29 LAB — CBC
HCT: 30.3 % — ABNORMAL LOW (ref 36.0–46.0)
Hemoglobin: 8.7 g/dL — ABNORMAL LOW (ref 12.0–15.0)
MCH: 25.4 pg — ABNORMAL LOW (ref 26.0–34.0)
MCHC: 28.7 g/dL — ABNORMAL LOW (ref 30.0–36.0)
MCV: 88.6 fL (ref 80.0–100.0)
Platelets: 471 10*3/uL — ABNORMAL HIGH (ref 150–400)
RBC: 3.42 MIL/uL — ABNORMAL LOW (ref 3.87–5.11)
RDW: 13.5 % (ref 11.5–15.5)
WBC: 15.2 10*3/uL — ABNORMAL HIGH (ref 4.0–10.5)
nRBC: 0 % (ref 0.0–0.2)

## 2018-05-29 MED ORDER — ENSURE ENLIVE PO LIQD: 237.0000 mL | Freq: Two times a day (BID) | ORAL | Status: DC

## 2018-05-29 MED ORDER — BISACODYL 10 MG RE SUPP: 10.0000 mg | Freq: Every day | RECTAL | Status: DC | PRN

## 2018-05-29 NOTE — Progress Notes (Signed)
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 [name], spinal cord compression/T3 pathological fracture/posterior epidural tumor T1-T8 status post surgery by Dr. [name] from neurosurgery 04/28/18.  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  Because of this finding, will was admitted at [facility] and decision was made to transfer her after further discussion with the cardiology for possible therapeutic pericardiocentesis. Patient seen by cardiac risk, who advised pericardial drain by interventional cardiology.  Assessment & Plan:  Pericardial effusion: Likely malignant.  CT 05/23/2018 with pericardial effusion,TTE 05/24/2018 with large pericardial effusion, no evidence of impending tamponade. I discussed with the CT surgery, consulted interventional cardiology for pericardial drain-however at this time given the risky nature with posterior effusion more than the anterior, cardiology and CT surgery decided on pericardial window Tolerated the procedure very well.  Patient was in the ICU postprocedure and now on stepdown unit.   Breast cancer of upper-outer quadrant of left female breast, triple negative, with metastasis to liver, lung: status post mastectomy, previous chemotherapy undergoing radiotherapy.   radiation oncology, holding off on radio therapy at this time.  Overall poor prognosis-has been informed by hematology to get her affairs in order as  she has a extremely aggressive metastatic disease. Onco Planning for  Port-A-Cath times this week. Patient also has requirement for radiation we will follow-up on recommendation from the cardiothoracic surgery regarding duration of the stay in the hospital  Spinal cord compression patient will need palliative radiation once more stable Transaminitis from her mets. Stable. Moderate protein Calorie malnutrition-cont to augment nutrition Hypokalemia: Improved. Hypertension off BP meds.  Blood pressure stable. Mild hyponatremia: monitor.  DVT prophylaxis: CAD.  Unable to use chemical prophylaxis due to need for procedure Code Status: Full Family Communication: Discussed plan in detail with patient.  No family at the bedside today.  Disposition Plan: On hold awaiting procedure   Consultants: Surgery, cardiology  Subjective: No acute complaint.  Reports constipation although getting better.  No nausea no vomiting.  Minimal oral intake.  Objective: Vitals:   05/29/18 1400 05/29/18 1500 05/29/18 1554 05/29/18 1600  BP: 113/77 110/79  97/67  Pulse: 91 (!) 102  95  Resp: (!) 22 19  (!) 22  Temp:   97.9 F (36.6 C)   TempSrc:   Oral   SpO2: 100% 100%  100%  Weight:      Height:        Intake/Output Summary (Last 24 hours) at 05/29/2018 1901 Last data filed at 05/29/2018 1700 Gross per 24 hour  Intake 2277.92 ml  Output 102 ml  Net 2175.92 ml   Filed Weights   05/24/18 0146 05/28/18 0600  Weight: 55.1 kg 52.8 kg   Weight change:   Body mass index is 20.64 kg/m.  Examination: General exam: Calm, comfortable not in acute distress, thin build, sick looking HEENT:Oral mucosa moist, Ear/Nose WNL grossly Respiratory system: Bilateral equal air entry, no crackles and wheezing Cardiovascular system: S1 & S2 heard, No JVD/murmurs.No pedal edema. Gastrointestinal system:  Abdomen soft, nontender non-distended, BS +. Nervous System:Alert/awake/oriented at baseline.  Able to move UE  and LE Extremities: No edema,distal peripheral pulses palpable. Skin: No rashes,no icterus. MSK: Normal muscle bulk,tone ,power  Data Reviewed: I have personally reviewed following labs and imaging studies  CBC: Recent Labs  Lab 05/24/18 0313 05/25/18 0344 05/26/18 0434 05/28/18 0426 05/29/18 0312  WBC 11.8* 13.0* 13.7* 19.1* 15.2*  NEUTROABS  --  9.9*  --   --   --   HGB 8.4* 9.2* 9.2* 8.6* 8.7*  HCT 28.1* 29.9* 29.5* 29.4* 30.3*  MCV 87.8 86.7 86.0 88.3 88.6  PLT 442* 454* 445* 497* 818*   Basic Metabolic Panel: Recent Labs  Lab 05/23/18 1614 05/24/18 0313 05/25/18 0344 05/26/18 0434 05/28/18 0426 05/29/18 0312  NA 136 131* 133* 134* 135 136  K 3.3* 3.9 4.0 4.0 4.1 4.2  CL 94* 97* 99 98 101 101  CO2 28 25 26 23 27 25   GLUCOSE 93 96 88 92 93 85  BUN 8 6 6 7 10 13   CREATININE 0.68 0.75 0.64 0.69 <0.30* 0.64  CALCIUM 9.2 8.3* 8.6* 8.5* 8.4* 8.5*  MG 1.8 1.7  --   --   --   --   PHOS  --  3.6 3.9  --   --   --    GFR: Estimated Creatinine Clearance: 78.9 mL/min (by C-G formula based on SCr of 0.64 mg/dL). Liver Function Tests: Recent Labs  Lab 05/24/18 0313 05/25/18 0344 05/26/18 0434 05/29/18 0312  AST 123*  --  128* 133*  ALT 53*  --  53* 46*  ALKPHOS 188*  --  189* 254*  BILITOT 0.7  --  0.9 0.8  PROT 6.6  --  6.9 6.4*  ALBUMIN 2.3* 2.3* 2.3* 2.1*   No results for input(s): LIPASE, AMYLASE in the last 168 hours. No results for input(s): AMMONIA in the last 168 hours. Coagulation Profile: Recent Labs  Lab 05/25/18 0344  INR 1.08   Cardiac Enzymes: No results for input(s): CKTOTAL, CKMB, CKMBINDEX, TROPONINI in the last 168 hours. BNP (last 3 results) No results for input(s): PROBNP in the last 8760 hours. HbA1C: No results for input(s): HGBA1C in the last 72 hours. CBG: No results for input(s): GLUCAP in the last 168 hours. Lipid Profile: No results for input(s): CHOL, HDL, LDLCALC, TRIG, CHOLHDL, LDLDIRECT in the last 72 hours. Thyroid  Function Tests: No results for input(s): TSH, T4TOTAL, FREET4, T3FREE, THYROIDAB in the last 72 hours. Anemia Panel: No results for input(s): VITAMINB12, FOLATE, FERRITIN, TIBC, IRON, RETICCTPCT in the last 72 hours. Sepsis Labs: No results for input(s): PROCALCITON, LATICACIDVEN in the last 168 hours.  Recent Results (from the past 240 hour(s))  Surgical pcr screen     Status: None   Collection Time: 05/25/18  5:21 AM  Result Value Ref Range Status   MRSA, PCR NEGATIVE NEGATIVE Final   Staphylococcus aureus NEGATIVE NEGATIVE Final    Comment: (NOTE) The Xpert SA Assay (FDA approved for NASAL specimens in patients 12 years of age and older), is one component of a comprehensive surveillance program. It is not intended to diagnose infection nor to guide or monitor treatment. Performed at [facility], [place] [address] [place], [place] [address]       Radiology Studies: Dg Chest Port 1 View  Result Date: 05/29/2018 CLINICAL DATA:  Follow-up pericardial catheter EXAM: PORTABLE CHEST 1 VIEW COMPARISON:  05/28/2018 FINDINGS: Cardiac shadow is stable. The previously seen pericardial  drain appears to have been removed. Right jugular central line is again seen. The lungs are well aerated bilaterally. No focal infiltrate or effusion is seen. No pneumothorax is noted. Postsurgical changes are noted in the upper thoracic spine. IMPRESSION: Interval removal of pericardial drain.  No acute abnormality noted. Electronically Signed   By: Inez Catalina M.D.   On: 05/29/2018 08:37   Dg Chest Port 1 View  Result Date: 05/28/2018 CLINICAL DATA:  Shortness of breath EXAM: PORTABLE CHEST 1 VIEW COMPARISON:  05/27/2018 FINDINGS: Cardiac shadow is stable. Postoperative changes are again seen. Right jugular central line and pericardial drain are noted in satisfactory position. Bilateral breast implants are noted. No focal infiltrate or sizable effusion is seen. No pneumothorax is noted. IMPRESSION:  Pericardial drain in place. No acute abnormality noted. Electronically Signed   By: Inez Catalina M.D.   On: 05/28/2018 09:46        Scheduled Meds: . acetaminophen  1,000 mg Oral Q6H   Or  . acetaminophen (TYLENOL) oral liquid 160 mg/5 mL  1,000 mg Oral Q6H  . bisacodyl  10 mg Oral Daily  . Chlorhexidine Gluconate Cloth  6 each Topical Daily  . [START ON 05/30/2018] feeding supplement (ENSURE ENLIVE)  237 mL Oral BID BM  . polyethylene glycol  17 g Oral Daily  . senna-docusate  1 tablet Oral QHS  . sodium chloride flush  3 mL Intravenous Q12H   Continuous Infusions: . sodium chloride Stopped (05/29/18 1555)  . dextrose 5 % and 0.9% NaCl 40 mL/hr at 05/29/18 1644  . potassium chloride       LOS: 6 days    Time spent: More than 50% of that time was spent in counseling and/or coordination of care.      Berle Mull, MD [facility]  If 7PM-7AM, please contact night-coverage [email or web address] Password [facility] 05/29/2018, 7:01 PM   Please Note: This patient record was dictated using Editor, commissioning. Chart creation errors have been sought, but may not always have been located. Such creation errors do not reflect on the Standard of Medical Care.

## 2018-05-29 NOTE — Progress Notes (Signed)
Patient ID: Dawn Haynes, female   DOB: 01-01-1981, 37 y.o.   MRN: [id number] TCTS DAILY ICU PROGRESS NOTE                   [address]            [place] [address]          [phone]   2 Days Post-Op Procedure(s) (LRB): SUBXYPHOID PERICARDIAL WINDOW (N/A) TRANSESOPHAGEAL ECHOCARDIOGRAM (TEE) (N/A)  Total Length of Stay:  LOS: 6 days   Subjective: No complaints this morning  Objective: Vital signs in last 24 hours: Temp:  [98.1 F (36.7 C)-98.8 F (37.1 C)] 98.2 F (36.8 C) (11/17 0400) Pulse Rate:  [81-151] 104 (11/17 0800) Cardiac Rhythm: Normal sinus rhythm (11/17 0800) Resp:  [15-34] 29 (11/17 0800) BP: (84-105)/(56-79) 96/65 (11/17 0800) SpO2:  [100 %] 100 % (11/17 0800)  Filed Weights   05/24/18 0146 05/28/18 0600  Weight: 55.1 kg 52.8 kg    Weight change:    Hemodynamic parameters for last 24 hours:    Intake/Output from previous day: 11/16 0701 - 11/17 0700 In: 1799.1 [P.O.:840; I.V.:959.1] Out: 41 [Urine:31; Chest Tube:10]  Intake/Output this shift: Total I/O In: 400 [P.O.:360; I.V.:40] Out: -   Current Meds: Scheduled Meds: . acetaminophen  1,000 mg Oral Q6H   Or  . acetaminophen (TYLENOL) oral liquid 160 mg/5 mL  1,000 mg Oral Q6H  . bisacodyl  10 mg Oral Daily  . Chlorhexidine Gluconate Cloth  6 each Topical Daily  . polyethylene glycol  17 g Oral Daily  . senna-docusate  1 tablet Oral QHS  . sodium chloride flush  3 mL Intravenous Q12H   Continuous Infusions: . sodium chloride    . dextrose 5 % and 0.9% NaCl 40 mL/hr at 05/29/18 0600  . potassium chloride     PRN Meds:.sodium chloride, acetaminophen **OR** [DISCONTINUED] acetaminophen, fentaNYL (SUBLIMAZE) injection, HYDROcodone-acetaminophen, ondansetron **OR** ondansetron (ZOFRAN) IV, oxyCODONE, potassium chloride, sodium chloride flush, sodium chloride flush  General appearance: alert and cooperative Neurologic: intact Heart: regular rate and rhythm, S1, S2  normal, no murmur, click, rub or gallop Lungs: dullness to percussion bibasilar Wound: Intact  Lab Results: CBC: Recent Labs    05/28/18 0426 05/29/18 0312  WBC 19.1* 15.2*  HGB 8.6* 8.7*  HCT 29.4* 30.3*  PLT 497* 471*   BMET:  Recent Labs    05/28/18 0426 05/29/18 0312  NA 135 136  K 4.1 4.2  CL 101 101  CO2 27 25  GLUCOSE 93 85  BUN 10 13  CREATININE <0.30* 0.64  CALCIUM 8.4* 8.5*    CMET: Lab Results  Component Value Date   WBC 15.2 (H) 05/29/2018   HGB 8.7 (L) 05/29/2018   HCT 30.3 (L) 05/29/2018   PLT 471 (H) 05/29/2018   GLUCOSE 85 05/29/2018   ALT 46 (H) 05/29/2018   AST 133 (H) 05/29/2018   NA 136 05/29/2018   K 4.2 05/29/2018   CL 101 05/29/2018   CREATININE 0.64 05/29/2018   BUN 13 05/29/2018   CO2 25 05/29/2018   TSH 2.192 05/24/2018   INR 1.08 05/25/2018      PT/INR: No results for input(s): LABPROT, INR in the last 72 hours. Radiology: Dg Chest Port 1 View  Result Date: 05/29/2018 CLINICAL DATA:  Follow-up pericardial catheter EXAM: PORTABLE CHEST 1 VIEW COMPARISON:  05/28/2018 FINDINGS: Cardiac shadow is stable. The previously seen pericardial drain appears to have been removed. Right jugular central line  is again seen. The lungs are well aerated bilaterally. No focal infiltrate or effusion is seen. No pneumothorax is noted. Postsurgical changes are noted in the upper thoracic spine. IMPRESSION: Interval removal of pericardial drain.  No acute abnormality noted. Electronically Signed   By: Inez Catalina M.D.   On: 05/29/2018 08:37     Assessment/Plan: S/P Procedure(s) (LRB): SUBXYPHOID PERICARDIAL WINDOW (N/A) TRANSESOPHAGEAL ECHOCARDIOGRAM (TEE) (N/A) Mobilize Patient stable without complaints this morning Waiting for transfer out of ICU, orders written yesterday DC central line   Grace Isaac 05/29/2018 9:29 AM

## 2018-05-29 NOTE — Consult Note (Signed)
Spoke with Nira Conn RN, she is aware of DC central line orders

## 2018-05-29 NOTE — Plan of Care (Signed)

## 2018-05-30 ENCOUNTER — Ambulatory Visit: Payer: Medicaid Other

## 2018-05-30 ENCOUNTER — Ambulatory Visit: Payer: Medicaid Other | Admitting: Radiation Oncology

## 2018-05-30 ENCOUNTER — Telehealth: Payer: Self-pay | Admitting: *Deleted

## 2018-05-30 LAB — TYPE AND SCREEN
ABO/RH(D): A POS
Antibody Screen: NEGATIVE
Unit division: 0

## 2018-05-30 LAB — CBC WITH DIFFERENTIAL/PLATELET
ABS IMMATURE GRANULOCYTES: 0.11 10*3/uL — AB (ref 0.00–0.07)
BASOS PCT: 0 %
Basophils Absolute: 0 10*3/uL (ref 0.0–0.1)
Eosinophils Absolute: 0.1 10*3/uL (ref 0.0–0.5)
Eosinophils Relative: 1 %
HCT: 29 % — ABNORMAL LOW (ref 36.0–46.0)
Hemoglobin: 8.5 g/dL — ABNORMAL LOW (ref 12.0–15.0)
IMMATURE GRANULOCYTES: 1 %
LYMPHS ABS: 1.8 10*3/uL (ref 0.7–4.0)
Lymphocytes Relative: 12 %
MCH: 25.8 pg — ABNORMAL LOW (ref 26.0–34.0)
MCHC: 29.3 g/dL — ABNORMAL LOW (ref 30.0–36.0)
MCV: 88.1 fL (ref 80.0–100.0)
MONOS PCT: 10 %
Monocytes Absolute: 1.4 10*3/uL — ABNORMAL HIGH (ref 0.1–1.0)
NEUTROS PCT: 76 %
Neutro Abs: 11.4 10*3/uL — ABNORMAL HIGH (ref 1.7–7.7)
PLATELETS: 466 10*3/uL — AB (ref 150–400)
RBC: 3.29 MIL/uL — ABNORMAL LOW (ref 3.87–5.11)
RDW: 13.6 % (ref 11.5–15.5)
WBC: 14.9 10*3/uL — ABNORMAL HIGH (ref 4.0–10.5)
nRBC: 0 % (ref 0.0–0.2)

## 2018-05-30 LAB — BPAM RBC
Blood Product Expiration Date: 201912072359
Unit Type and Rh: 6200

## 2018-05-30 LAB — COMPREHENSIVE METABOLIC PANEL
ALK PHOS: 290 U/L — AB (ref 38–126)
ALT: 49 U/L — ABNORMAL HIGH (ref 0–44)
AST: 132 U/L — AB (ref 15–41)
Albumin: 2 g/dL — ABNORMAL LOW (ref 3.5–5.0)
Anion gap: 9 (ref 5–15)
BUN: 9 mg/dL (ref 6–20)
CHLORIDE: 100 mmol/L (ref 98–111)
CO2: 23 mmol/L (ref 22–32)
CREATININE: 0.61 mg/dL (ref 0.44–1.00)
Calcium: 8.1 mg/dL — ABNORMAL LOW (ref 8.9–10.3)
GFR calc Af Amer: >60 mL/min (ref >60)
Glucose, Bld: 96 mg/dL (ref 70–99)
Potassium: 3.7 mmol/L (ref 3.5–5.1)
SODIUM: 132 mmol/L — AB (ref 135–145)
Total Bilirubin: 0.5 mg/dL (ref 0.3–1.2)
Total Protein: 6.3 g/dL — ABNORMAL LOW (ref 6.5–8.1)

## 2018-05-30 LAB — MAGNESIUM: Magnesium: 1.7 mg/dL (ref 1.7–2.4)

## 2018-05-30 NOTE — Progress Notes (Signed)
Patient arrived to unit 2 Heart. She is alert and oriented. Surgical area and dressing are clean dry and intact. Patient denies pain at this time. She has been oriented to the room and equipment. Call bell placed within reach. Patient is resting comfortably in bed.

## 2018-05-30 NOTE — Progress Notes (Signed)
I completed a follow up visit with the patient with her husband present. I provided spiritual support by sharing words of encouragement and prayer. I shared that the Chaplain is available for additional support as needed or requested.    05/30/18 1100  Clinical Encounter Type  Visited With Patient and family together  Visit Type Follow-up;Spiritual support    Chaplain Dr Redgie Grayer

## 2018-05-30 NOTE — Telephone Encounter (Signed)
Medical records faxed to [place]; release [phone]

## 2018-05-30 NOTE — Anesthesia Postprocedure Evaluation (Signed)
Anesthesia Post Note  Patient: Dawn Haynes  Procedure(s) Performed: SUBXYPHOID PERICARDIAL WINDOW (N/A ) TRANSESOPHAGEAL ECHOCARDIOGRAM (TEE) (N/A )     Patient location during evaluation: SICU Anesthesia Type: General Level of consciousness: awake and patient cooperative Pain management: pain level controlled Vital Signs Assessment: post-procedure vital signs reviewed and stable Respiratory status: spontaneous breathing, patient connected to nasal cannula oxygen and respiratory function stable Cardiovascular status: stable Postop Assessment: no apparent nausea or vomiting Anesthetic complications: no    Last Vitals:  Vitals:   05/30/18 0800 05/30/18 1149  BP: 105/72 114/81  Pulse: 90   Resp: 20   Temp: 37.2 C 36.8 C  SpO2: 100% 100%    Last Pain:  Vitals:   05/30/18 1149  TempSrc: Oral  PainSc: 0-No pain                 Mckenzi Buonomo

## 2018-05-30 NOTE — Progress Notes (Signed)
3 Days Post-Op Procedure(s) (LRB): SUBXYPHOID PERICARDIAL WINDOW (N/A) TRANSESOPHAGEAL ECHOCARDIOGRAM (TEE) (N/A) Subjective: VSS  Drain out Path pending comfortable on room air tx to tele bed  Objective: Vital signs in last 24 hours: Temp:  [97.9 F (36.6 C)-99 F (37.2 C)] 99 F (37.2 C) (11/18 0420) Pulse Rate:  [84-104] 90 (11/18 0700) Cardiac Rhythm: Normal sinus rhythm (11/18 0400) Resp:  [16-29] 25 (11/18 0700) BP: (96-115)/(62-82) 112/80 (11/18 0700) SpO2:  [100 %] 100 % (11/18 0700)  Hemodynamic parameters for last 24 hours:    Intake/Output from previous day: 11/17 0701 - 11/18 0700 In: 2322.7 [P.O.:1320; I.V.:1002.7] Out: 801 [Urine:800; Stool:1] Intake/Output this shift: No intake/output data recorded.  Lungs clear incision dry  Lab Results: Recent Labs    05/29/18 0312 05/30/18 0307  WBC 15.2* 14.9*  HGB 8.7* 8.5*  HCT 30.3* 29.0*  PLT 471* 466*   BMET:  Recent Labs    05/29/18 0312 05/30/18 0307  NA 136 132*  K 4.2 3.7  CL 101 100  CO2 25 23  GLUCOSE 85 96  BUN 13 9  CREATININE 0.64 0.61  CALCIUM 8.5* 8.1*    PT/INR: No results for input(s): LABPROT, INR in the last 72 hours. ABG    Component Value Date/Time   PHART 7.433 05/28/2018 0619   HCO3 25.7 05/28/2018 0619   TCO2 27 05/28/2018 0619   O2SAT 98.0 05/28/2018 0619   CBG (last 3)  No results for input(s): GLUCAP in the last 72 hours.  Assessment/Plan: S/P Procedure(s) (LRB): SUBXYPHOID PERICARDIAL WINDOW (N/A) TRANSESOPHAGEAL ECHOCARDIOGRAM (TEE) (N/A) tx to tele bed Can go home tomorrow   LOS: 7 days    Tharon Aquas Trigt III 05/30/2018

## 2018-05-30 NOTE — Progress Notes (Signed)
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 [name], spinal cord compression/T3 pathological fracture/posterior epidural tumor T1-T8 status post surgery by Dr. [name] from neurosurgery 04/28/18.  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  Patient underwent cardiac window creation by cardio thoracic surgery.   Assessment & Plan:  Pericardial effusion: malignant. CT 05/23/2018 with pericardial effusion,TTE 05/24/2018 with large pericardial effusion, no evidence of impending tamponade.  CT surgery was consulted, consulted interventional cardiology for pericardial drain-however at this time given the risky nature with posterior effusion more than the anterior, cardiology and CT surgery decided on pericardial window Tolerated the procedure very well.   Patient was in the ICU postprocedure and now on stepdown unit.   Breast cancer of upper-outer quadrant of left female breast, triple negative, with metastasis to liver, lung: status post mastectomy, previous chemotherapy undergoing radiotherapy.   Overall poor prognosis-has been informed by hematology to get her affairs in order as she has a extremely aggressive metastatic disease. Onco Planning for  Port-A-Cath times this week.  Can be performed outpatient per my discussion with cardio thoracic surgery. Patient also has requirement for radiation him I discussed with cardiothoracic surgery regarding radiation.  They  recommended as long as the patient remained stable overnight she can be discharged from their perspective tomorrow morning and as long as the radiation does not involve the surgical incision patient can undergo radiation tomorrow   Spinal cord compression  Underwent surgical debulking on October 2019. Patient will need palliative radiation once more stable  Transaminitis from her mets. Stable.  Moderate protein Calorie malnutrition-cont to augment nutrition  Hypokalemia: Improved.  Hypertension off BP meds.  Blood pressure stable.  Mild hyponatremia: monitor.  Constipation. Resolved.  DVT prophylaxis: SCD Code Status: Full Family Communication: Discussed plan in detail with patient.  family at the bedside today.  Disposition Plan: On hold awaiting procedure   Consultants: Surgery, cardiology  Subjective: Feeling better.  No fever no chills.  No chest pain.  Constipation resolved.  Pain well controlled.  Objective: Vitals:   05/30/18 0700 05/30/18 0800 05/30/18 1149 05/30/18 1555  BP: 112/80 105/72 114/81 102/65  Pulse: 90 90  (!) 109  Resp: (!) 25 20    Temp:  99 F (37.2 C) 98.3 F (36.8 C) 99.1 F (37.3 C)  TempSrc:  Oral Oral Oral  SpO2: 100% 100% 100% 100%  Weight:      Height:        Intake/Output Summary (Last 24 hours) at 05/30/2018 1710 Last data filed at 05/30/2018 1700 Gross per 24 hour  Intake 1402.12 ml  Output 1300 ml  Net 102.12 ml   Filed Weights   05/24/18 0146 05/28/18 0600  Weight: 55.1 kg 52.8 kg   Weight change:   Body mass index is 20.64 kg/m.  Examination: General exam: Calm, comfortable not in acute distress, thin build, sick looking HEENT:Oral mucosa moist, Ear/Nose WNL grossly Respiratory system: Bilateral equal air entry, no crackles and wheezing Cardiovascular system: S1 & S2 heard, No  JVD/murmurs.No pedal edema. Gastrointestinal system: Abdomen soft, nontender non-distended, BS +. Nervous System:Alert/awake/oriented at  baseline.  Able to move UE and LE Extremities: No edema,distal peripheral pulses palpable. Skin: No rashes,no icterus. MSK: Normal muscle bulk,tone ,power  Data Reviewed: I have personally reviewed following labs and imaging studies  CBC: Recent Labs  Lab 05/25/18 0344 05/26/18 0434 05/28/18 0426 05/29/18 0312 05/30/18 0307  WBC 13.0* 13.7* 19.1* 15.2* 14.9*  NEUTROABS 9.9*  --   --   --  11.4*  HGB 9.2* 9.2* 8.6* 8.7* 8.5*  HCT 29.9* 29.5* 29.4* 30.3* 29.0*  MCV 86.7 86.0 88.3 88.6 88.1  PLT 454* 445* 497* 471* 993*   Basic Metabolic Panel: Recent Labs  Lab 05/24/18 0313 05/25/18 0344 05/26/18 0434 05/28/18 0426 05/29/18 0312 05/30/18 0307  NA 131* 133* 134* 135 136 132*  K 3.9 4.0 4.0 4.1 4.2 3.7  CL 97* 99 98 101 101 100  CO2 25 26 23 27 25 23   GLUCOSE 96 88 92 93 85 96  BUN 6 6 7 10 13 9   CREATININE 0.75 0.64 0.69 <0.30* 0.64 0.61  CALCIUM 8.3* 8.6* 8.5* 8.4* 8.5* 8.1*  MG 1.7  --   --   --   --  1.7  PHOS 3.6 3.9  --   --   --   --    GFR: Estimated Creatinine Clearance: 78.9 mL/min (by C-G formula based on SCr of 0.61 mg/dL). Liver Function Tests: Recent Labs  Lab 05/24/18 0313 05/25/18 0344 05/26/18 0434 05/29/18 0312 05/30/18 0307  AST 123*  --  128* 133* 132*  ALT 53*  --  53* 46* 49*  ALKPHOS 188*  --  189* 254* 290*  BILITOT 0.7  --  0.9 0.8 0.5  PROT 6.6  --  6.9 6.4* 6.3*  ALBUMIN 2.3* 2.3* 2.3* 2.1* 2.0*   No results for input(s): LIPASE, AMYLASE in the last 168 hours. No results for input(s): AMMONIA in the last 168 hours. Coagulation Profile: Recent Labs  Lab 05/25/18 0344  INR 1.08   Cardiac Enzymes: No results for input(s): CKTOTAL, CKMB, CKMBINDEX, TROPONINI in the last 168 hours. BNP (last 3 results) No results for input(s): PROBNP in the last 8760 hours. HbA1C: No results for input(s): HGBA1C in the last 72 hours. CBG: No results for input(s): GLUCAP in the last 168 hours. Lipid Profile: No results for input(s): CHOL,  HDL, LDLCALC, TRIG, CHOLHDL, LDLDIRECT in the last 72 hours. Thyroid Function Tests: No results for input(s): TSH, T4TOTAL, FREET4, T3FREE, THYROIDAB in the last 72 hours. Anemia Panel: No results for input(s): VITAMINB12, FOLATE, FERRITIN, TIBC, IRON, RETICCTPCT in the last 72 hours. Sepsis Labs: No results for input(s): PROCALCITON, LATICACIDVEN in the last 168 hours.  Recent Results (from the past 240 hour(s))  Surgical pcr screen     Status: None   Collection Time: 05/25/18  5:21 AM  Result Value Ref Range Status   MRSA, PCR NEGATIVE NEGATIVE Final   Staphylococcus aureus NEGATIVE NEGATIVE Final    Comment: (NOTE) The Xpert SA Assay (FDA approved for NASAL specimens in patients 91 years of age and older), is one component of a comprehensive surveillance program. It is not intended to diagnose infection nor to guide or monitor treatment. Performed at [facility], [place] [address], [place], [place] [address]       Radiology Studies: Dg Chest Port 1 View  Result Date: 05/29/2018 CLINICAL DATA:  Follow-up pericardial catheter EXAM: PORTABLE CHEST 1 VIEW COMPARISON:  05/28/2018 FINDINGS: Cardiac shadow is stable. The previously seen pericardial drain appears to have been removed. Right jugular central line is again seen. The lungs are well aerated bilaterally. No focal infiltrate or effusion is seen. No pneumothorax is noted. Postsurgical changes are noted in the upper thoracic spine. IMPRESSION: Interval removal of pericardial drain.  No acute abnormality noted. Electronically Signed   By: Inez Catalina M.D.   On: 05/29/2018 08:37        Scheduled Meds: . acetaminophen  1,000 mg Oral Q6H   Or  . acetaminophen (TYLENOL) oral liquid 160 mg/5 mL  1,000 mg Oral Q6H  . bisacodyl  10 mg Oral Daily  . feeding supplement (ENSURE ENLIVE)  237 mL Oral BID BM  . polyethylene glycol  17 g Oral Daily  . senna-docusate  1 tablet Oral QHS  . sodium chloride flush  3 mL Intravenous  Q12H   Continuous Infusions: . sodium chloride Stopped (05/29/18 2001)  . dextrose 5 % and 0.9% NaCl 40 mL/hr at 05/30/18 1700  . potassium chloride       LOS: 7 days    Time spent: 35 minutes  More than 50% of that time was spent in counseling and/or coordination of care.      Berle Mull, MD [facility]  If 7PM-7AM, please contact night-coverage [email or web address] Password [email or web address] 05/30/2018, 5:10 PM

## 2018-05-30 NOTE — Progress Notes (Signed)
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  She was seen recently in cardiology office 05/19/2018 1 to be tachycardic short of breath in setting of pericardial effusion. Patient presents now s/p subxyphoid pericardial window.    PT Comments    Patient seen for activity progression. Mobilizing well. Ambulating without device and tolerated stair negotiation with use of rail. Anticipate patient is safe for d/c home with family assist. No further acute PT needs.   Follow Up Recommendations  No PT follow up;Supervision - Intermittent     Equipment Recommendations  None recommended by PT    Recommendations for Other Services       Precautions / Restrictions Precautions Precautions: (chest tube in place) Precaution Comments: recent sternal and back Restrictions Weight Bearing Restrictions: Yes LUE Weight Bearing: Non weight bearing    Mobility  Bed Mobility               General bed mobility comments: received in chair  Transfers Overall transfer level: Needs assistance Equipment used: None Transfers: Sit to/from Stand Sit to Stand: Supervision         General transfer comment: supervision for line management  Ambulation/Gait Ambulation/Gait assistance: Supervision Gait Distance (Feet): 380 Feet Assistive device: None Gait Pattern/deviations: WFL(Within Functional Limits);Step-through pattern   Gait velocity interpretation: <1.8 ft/sec, indicate of risk for recurrent falls General Gait Details: some modest instability without device but no difficulty   Stairs Stairs: Yes Stairs assistance: Supervision Stair  Management: One rail Left Number of Stairs: 6 General stair comments: supervision for safety   Wheelchair Mobility    Modified Rankin (Stroke Patients Only)       Balance Overall balance assessment: Mild deficits observed, not formally tested                                          Cognition Arousal/Alertness: Awake/alert Behavior During Therapy: WFL for tasks assessed/performed Overall Cognitive Status: Within Functional Limits for tasks assessed                                        Exercises      General Comments        Pertinent Vitals/Pain      Home Living                      Prior Function            PT Goals (current goals can now be found in the care plan section) Acute Rehab PT Goals Patient Stated Goal: to go home PT Goal Formulation: With patient Time For Goal Achievement: 06/11/18 Potential to Achieve Goals: Good Progress towards PT goals: Progressing toward goals    Frequency    Min 3X/week      PT Plan Current plan remains appropriate    Co-evaluation              AM-PAC PT "6 Clicks" Daily Activity  Outcome Measure  Difficulty turning over in bed (including adjusting bedclothes, sheets and blankets)?:  None Difficulty moving from lying on back to sitting on the side of the bed? : None Difficulty sitting down on and standing up from a chair with arms (e.g., wheelchair, bedside commode, etc,.)?: A Little Help needed moving to and from a bed to chair (including a wheelchair)?: A Little Help needed walking in hospital room?: A Little Help needed climbing 3-5 steps with a railing? : A Little 6 Click Score: 20    End of Session   Activity Tolerance: Patient tolerated treatment well Patient left: in bed;with call bell/phone within reach Nurse Communication: Mobility status PT Visit Diagnosis: Difficulty in walking, not elsewhere classified (R26.2)     Time: 1416-1430 PT Time  Calculation (min) (ACUTE ONLY): 14 min  Charges:  $Gait Training: 8-22 mins                     Alben Deeds, PT DPT  Board Certified Neurologic Specialist [place] Pager [phone] Office [place] 05/30/2018, 3:56 PM

## 2018-05-30 NOTE — Plan of Care (Signed)

## 2018-05-31 ENCOUNTER — Ambulatory Visit: Payer: Medicaid Other

## 2018-05-31 ENCOUNTER — Ambulatory Visit
Admission: RE | Admit: 2018-05-31 | Discharge: 2018-05-31 | Disposition: A | Discharge status: Home / Self Care | Payer: Medicaid Other | Source: Ambulatory Visit | Attending: Radiation Oncology | Admitting: Radiation Oncology

## 2018-05-31 ENCOUNTER — Inpatient Hospital Stay (HOSPITAL_BASED_OUTPATIENT_CLINIC_OR_DEPARTMENT_OTHER): Payer: Medicaid Other | Admitting: Hematology and Oncology

## 2018-05-31 VITALS — BP 117/83 | HR 104 | Temp 100.1°F | Resp 17 | Ht 63.0 in | Wt 125.2 lb

## 2018-05-31 DIAGNOSIS — C787 Secondary malignant neoplasm of liver and intrahepatic bile duct: Secondary | ICD-10-CM | POA: Diagnosis not present

## 2018-05-31 DIAGNOSIS — Z171 Estrogen receptor negative status [ER-]: Secondary | ICD-10-CM | POA: Diagnosis not present

## 2018-05-31 DIAGNOSIS — Z515 Encounter for palliative care: Secondary | ICD-10-CM | POA: Insufficient documentation

## 2018-05-31 DIAGNOSIS — C50412 Malignant neoplasm of upper-outer quadrant of left female breast: Secondary | ICD-10-CM | POA: Diagnosis not present

## 2018-05-31 DIAGNOSIS — Z9221 Personal history of antineoplastic chemotherapy: Secondary | ICD-10-CM | POA: Diagnosis not present

## 2018-05-31 DIAGNOSIS — Z923 Personal history of irradiation: Secondary | ICD-10-CM

## 2018-05-31 DIAGNOSIS — Z79899 Other long term (current) drug therapy: Secondary | ICD-10-CM

## 2018-05-31 DIAGNOSIS — Z7189 Other specified counseling: Secondary | ICD-10-CM

## 2018-05-31 MED ORDER — LIDOCAINE-PRILOCAINE 2.5-2.5 % EX CREA: TOPICAL_CREAM | CUTANEOUS | 3 refills | Status: AC

## 2018-05-31 MED ORDER — PROCHLORPERAZINE MALEATE 10 MG PO TABS: 10.0000 mg | ORAL_TABLET | Freq: Four times a day (QID) | ORAL | 1 refills | Status: DC | PRN

## 2018-05-31 MED ORDER — MAGNESIUM 400 MG PO TABS: 400.0000 mg | ORAL_TABLET | Freq: Two times a day (BID) | ORAL | 0 refills | Status: DC

## 2018-05-31 MED ORDER — LORAZEPAM 0.5 MG PO TABS: 0.5000 mg | ORAL_TABLET | Freq: Every evening | ORAL | 0 refills | Status: AC | PRN

## 2018-05-31 MED ORDER — ONDANSETRON HCL 8 MG PO TABS: 8.0000 mg | ORAL_TABLET | Freq: Two times a day (BID) | ORAL | 1 refills | Status: DC | PRN

## 2018-05-31 NOTE — Progress Notes (Signed)
Order received to discharge patient.  Telemetry monitor removed and CCMD notified.  PIV access removed.  Discharge instructions, follow up, medications and instructions for their use discussed with patient. 

## 2018-05-31 NOTE — Discharge Instructions (Signed)
Discharge Instructions:  You may shower, please wash incisions daily with soap and water and keep dry.  If you wish to cover wounds with dressing you may do so but please keep clean and change daily.  No tub baths or swimming until incisions have completely healed.  If your incisions become red or develop any drainage please call our office at [phone] If any questions or concerns arise, please do not hesitate to contact our office at [phone]

## 2018-05-31 NOTE — Discharge Summary (Signed)
Physician Discharge Summary  Dawn Haynes DJM:[id number] DOB: [birth year] DOA: 05/23/2018  PCP: Dawn Sic, PA  Admit date: 05/23/2018 Discharge date: 05/31/2018  Admitted From: Home Disposition:  Home  Recommendations for Outpatient Follow-up:  1. Follow up with PCP in 1-2 weeks 2. Please obtain BMP/CBC in one week 3. 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 [name]- Idell Haynes, spinal cord compression/T3 pathological fracture/posterior epidural tumor T1-T8 status post surgery by Dr. Earle Haynes from neurosurgery 04/28/18.  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  Patient underwent cardiac window creation by cardio thoracic surgery.  Following is her problem list:  Pericardial effusion: malignant. CT 05/23/2018 with pericardial effusion,TTE 05/24/2018 with large pericardial effusion, no evidence of impending tamponade.  CT surgery was consulted, consulted interventional cardiology for pericardial drain-however at this time given the risky nature with posterior effusion more than the anterior, cardiology and CT surgery decided on pericardial window. Tolerated the procedure very well.    Breast cancer of upper-outer quadrant of left female breast, triple negative, with metastasis to liver, lung: status post mastectomy, previous chemotherapy undergoing radiotherapy.    Overall poor prognosis-has been informed by hematology to get her affairs in order as she has a extremely aggressive metastatic disease. Onco Planning for  Port-A-Cath times this week.  Can be performed outpatient per cardio thoracic surgery.  Spinal cord compression  Underwent surgical debulking on October 2019. Patient will need palliative radiation once more stable  Transaminitis from her mets. Stable.  Moderate protein Calorie malnutrition-cont to augment nutrition  Hypokalemia: Improved.  Hypomagnesemia: Patient discharged with magnesium replacement.  Hypertension off BP meds.  Blood pressure stable.  Constipation. Resolved.  Discharge Diagnoses:  Active Problems:   Breast cancer of upper-outer quadrant of left female breast (HCC)   SOB (shortness of breath)   Metastasis to liver [facility])   Pericardial effusion   Hypokalemia   Chest pain    Discharge Instructions Patient at this time says she feels a lot better and is insisting on being discharged home today.  She has an appointment with oncology this afternoon.  Vitals, physical examination at the time of discharge fairly stable.  Discharge plan of care discussed with the patient.  She verbalized understanding.  Answered all of her questions appropriately to the best of my knowledge.   Allergies as of 05/31/2018   No Known Allergies     Medication List    TAKE these medications   ALPHA LIPOIC ACID PO Take 1 capsule by mouth daily.   cyclobenzaprine 10 MG tablet Commonly known as:  FLEXERIL Take 1 tablet (10 mg total) by mouth 3 (three) times daily as needed for muscle spasms.   docusate sodium 100 MG capsule Commonly known as:  COLACE Take 1 capsule (100 mg total) by mouth 2 (two) times daily.   Magnesium 400 MG Tabs Take 400 mg by mouth 2 (two) times daily.   metoprolol tartrate 25 MG tablet Commonly known as:  LOPRESSOR  Take 1 tablet (25 mg total) by mouth 2 (two) times daily.   multivitamin  tablet Take 1 tablet by mouth daily.   oxyCODONE 5 MG immediate release tablet Commonly known as:  Oxy IR/ROXICODONE Take 1 tablet (5 mg total) by mouth every 3 (three) hours as needed (pain).   TURMERIC PO Take 1 capsule by mouth daily.      Follow-up Information    Dawn Poot, MD Follow up.   Specialty:  Cardiothoracic Surgery Why:  PA/LAT CXR to be taken (at [facility] which is in the same building as Dr. Lucianne Lei Trigt's office) one hour prior to office appointment;Office will call with appointment date and time Contact information: [address] [place] [address] [phone]          No Known Allergies    Procedures/Studies: Dg Chest 1 View  Result Date: 05/13/2018 CLINICAL DATA:  Shortness of breath and history of breast cancer. EXAM: CHEST  1 VIEW COMPARISON:  None. FINDINGS: The heart size and mediastinal contours are within normal limits. Both lungs are clear. The visualized skeletal structures are unremarkable. Prior median sternotomy, left axillary nodal dissection and cholecystectomy. IMPRESSION: No active disease. Electronically Signed   By: Dawn Haynes M.D.   On: 05/13/2018 16:08   Dg Chest 2 View  Result Date: 05/23/2018 CLINICAL DATA:  Status post thoracic spine surgery on April 28, 2018. Onset of chest pain today associated with shortness of breath history of previous left breast malignancy with mastectomy. Currently has tachycardia. EXAM: CHEST - 2 VIEW COMPARISON:  Portable chest x-ray of May 13, 2018 FINDINGS: The lungs are mildly hyperinflated. There is no pleural effusion or pneumothorax. The cardiac silhouette is enlarged. The pulmonary vascularity is normal. The sternal wires are intact. The patient has undergone an anterior fusion procedure in the upper thoracic spine. IMPRESSION: Mild hyperinflation may be voluntary or may reflect underlying reactive airway disease. No evidence of pneumonia, CHF, nor metastatic  disease. Electronically Signed   By: Dawn  Haynes M.D.   On: 05/23/2018 16:34   Dg Abd 1 View  Result Date: 05/13/2018 CLINICAL DATA:  Shortness of breath and abdominal discomfort. EXAM: ABDOMEN - 1 VIEW COMPARISON:  CT abdomen pelvis dated July 23, 2017. FINDINGS: The bowel gas pattern is normal. No radio-opaque calculi or other significant radiographic abnormality are seen. Prior cholecystectomy. No acute osseous abnormality. IMPRESSION: Negative. Electronically Signed   By: Titus Dubin M.D.   On: 05/13/2018 16:15   Ct Angio Chest Pe W/cm &/or Wo Cm  Addendum Date: 05/23/2018   ADDENDUM REPORT: 05/23/2018 19:31 ADDENDUM: Addendum created to address the impression. IMPRESSION: The second finding should read: New large pericardial effusion.  Recommend correlation with ECHO. Electronically Signed   By: Corrie Mckusick D.O.   On: 05/23/2018 19:31   Result Date: 05/23/2018 CLINICAL DATA:  37 year old female with a history of chest pain and shortness of breath. History of breast carcinoma with prior treatment. EXAM: CT ANGIOGRAPHY CHEST CT ABDOMEN AND PELVIS WITH CONTRAST TECHNIQUE: Multidetector CT imaging of the chest was performed using the standard protocol during bolus administration of intravenous contrast. Multiplanar CT image reconstructions and MIPs were obtained to evaluate the vascular anatomy. Multidetector CT imaging of the abdomen and pelvis was performed using the standard protocol during bolus administration of intravenous contrast. CONTRAST:  12mL ISOVUE-370 IOPAMIDOL (ISOVUE-370) INJECTION 76% COMPARISON:  CT abdomen 07/23/2017 MR 04/27/2018 FINDINGS: CTA CHEST FINDINGS Cardiovascular: Heart: New pericardial effusion. No significant calcifications of  the valves or a coronary arteries. Surgical changes of median sternotomy. Aorta: Unremarkable course, caliber, contour of the thoracic aorta. No aneurysm or dissection flap. No periaortic fluid. Pulmonary arteries: No central, lobar,  segmental, or proximal subsegmental filling defects. Mediastinum/Nodes: No large lymph nodes identified. Streak artifact somewhat limits evaluation. There are small lymph nodes in the axillary regions. Surgical changes of left axilla. Surgical changes of bilateral breast reconstruction/augmentation. Unremarkable thyroid. Lungs/Pleura: No confluent airspace disease. No endotracheal or endobronchial debris. No pneumothorax. There are new pulmonary nodules compared to the prior CT. These include peripheral nodules of the bilateral upper lobes image 37, 38, 42 of series 6. Lingular nodule on image 78. Thickening of the interlobular septa of the right upper lobe, with no confluent airspace disease. Atelectasis at the right lung base. Musculoskeletal: Surgical changes of T2-T3, with T3 corpectomy. No acute fracture. Since the prior CT there have been interval development of a erosive bony changes at the posterior aspect of the left third rib, fourth rib, left posterior elements of T3 and T4. Review of the MIP images confirms the above findings. CT ABDOMEN and PELVIS FINDINGS Hepatobiliary: Compared to the prior CT of January, there has been near replacement of the liver parenchyma with metastatic disease. Surgical changes of cholecystectomy. Pancreas: Unremarkable pancreas Spleen: Unremarkable spleen Adrenals/Urinary Tract: Unremarkable adrenal glands. Stomach/Bowel: Unremarkable stomach. Unremarkable small bowel. No abnormal distention. No transition point. Unremarkable colon with moderate to large stool burden. Vascular/Lymphatic: Unremarkable aorta. Proximal femoral arteries remain patent. Engorged pelvic veins. 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 Left gonadal vein remains patent. Unremarkable IVC. Unremarkable appearance of the iliac veins. Reproductive: Unremarkable uterus.  Unremarkable adnexa. Other: Low-density ascites within the pelvis. No retroperitoneal or pelvic adenopathy.  Musculoskeletal: No acute displaced fracture. No aggressive bony lesions of the spine and pelvis. Review of the MIP images confirms the above findings. IMPRESSION: CT is negative for pulmonary embolism. New large pleural effusion.  Recommend correlation with ECHO. Compared to the prior CT of January, there has been significant progression of metastatic disease, with now near replacement of liver parenchyma, new pulmonary nodules, and new erosive bony changes of T2, T3, and the left second and third ribs. 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 Referral for oncologic follow-up recommended. Electronically Signed: By: Corrie Mckusick D.O. On: 05/23/2018 19:01   Ct Abdomen Pelvis W Contrast  Addendum Date: 05/23/2018   ADDENDUM REPORT: 05/23/2018 19:31 ADDENDUM: Addendum created to address the impression. IMPRESSION: The second finding should read: New large pericardial effusion.  Recommend correlation with ECHO. Electronically Signed   By: Corrie Mckusick D.O.   On: 05/23/2018 19:31   Result Date: 05/23/2018 CLINICAL DATA:  37 year old female with a history of chest pain and shortness of breath. History of breast carcinoma with prior treatment. EXAM: CT ANGIOGRAPHY CHEST CT ABDOMEN AND PELVIS WITH CONTRAST TECHNIQUE: Multidetector CT imaging of the chest was performed using the standard protocol during bolus administration of intravenous contrast. Multiplanar CT image reconstructions and MIPs were obtained to evaluate the vascular anatomy. Multidetector CT imaging of the abdomen and pelvis was performed using the standard protocol during bolus administration of intravenous contrast. CONTRAST:  145mL ISOVUE-370 IOPAMIDOL (ISOVUE-370) INJECTION 76% COMPARISON:  CT abdomen 07/23/2017 MR 04/27/2018 FINDINGS: CTA CHEST FINDINGS Cardiovascular: Heart: New pericardial effusion. No significant calcifications of the valves or a coronary arteries. Surgical changes of median sternotomy.  Aorta: Unremarkable course, caliber, contour of the thoracic aorta. No aneurysm or  dissection flap. No periaortic fluid. Pulmonary arteries: No central, lobar, segmental, or proximal subsegmental filling defects. Mediastinum/Nodes: No large lymph nodes identified. Streak artifact somewhat limits evaluation. There are small lymph nodes in the axillary regions. Surgical changes of left axilla. Surgical changes of bilateral breast reconstruction/augmentation. Unremarkable thyroid. Lungs/Pleura: No confluent airspace disease. No endotracheal or endobronchial debris. No pneumothorax. There are new pulmonary nodules compared to the prior CT. These include peripheral nodules of the bilateral upper lobes image 37, 38, 42 of series 6. Lingular nodule on image 78. Thickening of the interlobular septa of the right upper lobe, with no confluent airspace disease. Atelectasis at the right lung base. Musculoskeletal: Surgical changes of T2-T3, with T3 corpectomy. No acute fracture. Since the prior CT there have been interval development of a erosive bony changes at the posterior aspect of the left third rib, fourth rib, left posterior elements of T3 and T4. Review of the MIP images confirms the above findings. CT ABDOMEN and PELVIS FINDINGS Hepatobiliary: Compared to the prior CT of January, there has been near replacement of the liver parenchyma with metastatic disease. Surgical changes of cholecystectomy. Pancreas: Unremarkable pancreas Spleen: Unremarkable spleen Adrenals/Urinary Tract: Unremarkable adrenal glands. Stomach/Bowel: Unremarkable stomach. Unremarkable small bowel. No abnormal distention. No transition point. Unremarkable colon with moderate to large stool burden. Vascular/Lymphatic: Unremarkable aorta. Proximal femoral arteries remain patent. Engorged pelvic veins. 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 Left gonadal vein remains patent. Unremarkable IVC. Unremarkable appearance of the iliac  veins. Reproductive: Unremarkable uterus.  Unremarkable adnexa. Other: Low-density ascites within the pelvis. No retroperitoneal or pelvic adenopathy. Musculoskeletal: No acute displaced fracture. No aggressive bony lesions of the spine and pelvis. Review of the MIP images confirms the above findings. IMPRESSION: CT is negative for pulmonary embolism. New large pleural effusion.  Recommend correlation with ECHO. Compared to the prior CT of January, there has been significant progression of metastatic disease, with now near replacement of liver parenchyma, new pulmonary nodules, and new erosive bony changes of T2, T3, and the left second and third ribs. 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 Referral for oncologic follow-up recommended. Electronically Signed: By: Corrie Mckusick D.O. On: 05/23/2018 19:01   US Abdomen Limited  Result Date: 05/13/2018 CLINICAL DATA:  Abdominal distention. History of metastatic breast cancer and abdominal ascites. EXAM: LIMITED ABDOMEN ULTRASOUND FOR ASCITES TECHNIQUE: Limited ultrasound survey for ascites was performed in all four abdominal quadrants. COMPARISON:  CT abdomen pelvis dated July 23, 2017. FINDINGS: No free fluid in the abdomen. IMPRESSION: No ascites. Electronically Signed   By: Titus Dubin M.D.   On: 05/13/2018 16:17   Dg Chest Port 1 View  Result Date: 05/29/2018 CLINICAL DATA:  Follow-up pericardial catheter EXAM: PORTABLE CHEST 1 VIEW COMPARISON:  05/28/2018 FINDINGS: Cardiac shadow is stable. The previously seen pericardial drain appears to have been removed. Right jugular central line is again seen. The lungs are well aerated bilaterally. No focal infiltrate or effusion is seen. No pneumothorax is noted. Postsurgical changes are noted in the upper thoracic spine. IMPRESSION: Interval removal of pericardial drain.  No acute abnormality noted. Electronically Signed   By: Inez Catalina M.D.   On: 05/29/2018 08:37    Dg Chest Port 1 View  Result Date: 05/28/2018 CLINICAL DATA:  Shortness of breath EXAM: PORTABLE CHEST 1 VIEW COMPARISON:  05/27/2018 FINDINGS: Cardiac shadow is stable. Postoperative changes are again seen. Right jugular central line and pericardial drain are noted in  satisfactory position. Bilateral breast implants are noted. No focal infiltrate or sizable effusion is seen. No pneumothorax is noted. IMPRESSION: Pericardial drain in place. No acute abnormality noted. Electronically Signed   By: Inez Catalina M.D.   On: 05/28/2018 09:46   Dg Chest Port 1 View  Result Date: 05/27/2018 CLINICAL DATA:  Pericardial effusion EXAM: PORTABLE CHEST 1 VIEW COMPARISON:  05/23/2018 FINDINGS: Right IJ central line tip SVC RA junction. Pericardial drain in place. Improvement in the cardiac silhouette. No focal airspace process, collapse or consolidation. No edema, effusion or large pneumothorax. Postop changes from previous median sternotomy and upper thoracic fusion. Breast implants noted bilaterally. IMPRESSION: Improvement in the cardiac silhouette following pericardial drain for the large pericardial effusion. Normal heart size and vascularity. No developing acute airspace process or large effusion Electronically Signed   By: Jerilynn Mages.  Shick M.D.   On: 05/27/2018 09:53      Subjective: Patient understands feels better.  She denies having any complaints at this time and is wanting to be discharged home.  Discharge Exam: Vitals:   05/30/18 1940 05/31/18 0400  BP: 102/74 107/85  Pulse: 92 90  Resp: (!) 23 (!) 22  Temp: 99.2 F (37.3 C) 98.5 F (36.9 C)  SpO2: 100% 100%   Vitals:   05/30/18 1149 05/30/18 1555 05/30/18 1940 05/31/18 0400  BP: 114/81 102/65 102/74 107/85  Pulse:  (!) 109 92 90  Resp:   (!) 23 (!) 22  Temp: 98.3 F (36.8 C) 99.1 F (37.3 C) 99.2 F (37.3 C) 98.5 F (36.9 C)  TempSrc: Oral Oral Oral Oral  SpO2: 100% 100% 100% 100%  Weight:      Height:        General: Pt is  alert, awake, not in acute distress Cardiovascular: RRR, S1/S2 +, no rubs, no gallops Respiratory: CTA bilaterally, no wheezing, no rhonchi Abdominal: Soft, NT, ND, bowel sounds + Extremities: no edema, no cyanosis    The results of significant diagnostics from this hospitalization (including imaging, microbiology, ancillary and laboratory) are listed below for reference.     Microbiology: Recent Results (from the past 240 hour(s))  Surgical pcr screen     Status: None   Collection Time: 05/25/18  5:21 AM  Result Value Ref Range Status   MRSA, PCR NEGATIVE NEGATIVE Final   Staphylococcus aureus NEGATIVE NEGATIVE Final    Comment: (NOTE) The Xpert SA Assay (FDA approved for NASAL specimens in patients 23 years of age and older), is one component of a comprehensive surveillance program. It is not intended to diagnose infection nor to guide or monitor treatment. Performed at [facility], [place] [address] [place], [place] [address]      Labs: BNP (last 3 results) No results for input(s): BNP in the last 8760 hours. Basic Metabolic Panel: Recent Labs  Lab 05/25/18 0344 05/26/18 0434 05/28/18 0426 05/29/18 0312 05/30/18 0307  NA 133* 134* 135 136 132*  K 4.0 4.0 4.1 4.2 3.7  CL 99 98 101 101 100  CO2 26 23 27 25 23   GLUCOSE 88 92 93 85 96  BUN 6 7 10 13 9   CREATININE 0.64 0.69 <0.30* 0.64 0.61  CALCIUM 8.6* 8.5* 8.4* 8.5* 8.1*  MG  --   --   --   --  1.7  PHOS 3.9  --   --   --   --    Liver Function Tests: Recent Labs  Lab 05/25/18 0344 05/26/18 0434 05/29/18 0312 05/30/18 6144  AST  --  128* 133* 132*  ALT  --  53* 46* 49*  ALKPHOS  --  189* 254* 290*  BILITOT  --  0.9 0.8 0.5  PROT  --  6.9 6.4* 6.3*  ALBUMIN 2.3* 2.3* 2.1* 2.0*   No results for input(s): LIPASE, AMYLASE in the last 168 hours. No results for input(s): AMMONIA in the last 168 hours. CBC: Recent Labs  Lab 05/25/18 0344 05/26/18 0434 05/28/18 0426 05/29/18 0312 05/30/18 0307   WBC 13.0* 13.7* 19.1* 15.2* 14.9*  NEUTROABS 9.9*  --   --   --  11.4*  HGB 9.2* 9.2* 8.6* 8.7* 8.5*  HCT 29.9* 29.5* 29.4* 30.3* 29.0*  MCV 86.7 86.0 88.3 88.6 88.1  PLT 454* 445* 497* 471* 466*   Cardiac Enzymes: No results for input(s): CKTOTAL, CKMB, CKMBINDEX, TROPONINI in the last 168 hours. BNP: Invalid input(s): POCBNP CBG: No results for input(s): GLUCAP in the last 168 hours. D-Dimer No results for input(s): DDIMER in the last 72 hours. Hgb A1c No results for input(s): HGBA1C in the last 72 hours. Lipid Profile No results for input(s): CHOL, HDL, LDLCALC, TRIG, CHOLHDL, LDLDIRECT in the last 72 hours. Thyroid function studies No results for input(s): TSH, T4TOTAL, T3FREE, THYROIDAB in the last 72 hours.  Invalid input(s): FREET3 Anemia work up No results for input(s): VITAMINB12, FOLATE, FERRITIN, TIBC, IRON, RETICCTPCT in the last 72 hours. Urinalysis    Component Value Date/Time   COLORURINE AMBER (A) 05/26/2018 1354   APPEARANCEUR CLEAR 05/26/2018 1354   LABSPEC 1.020 05/26/2018 1354   PHURINE 6.0 05/26/2018 1354   GLUCOSEU NEGATIVE 05/26/2018 1354   HGBUR NEGATIVE 05/26/2018 1354   BILIRUBINUR NEGATIVE 05/26/2018 1354   KETONESUR NEGATIVE 05/26/2018 1354   PROTEINUR NEGATIVE 05/26/2018 1354   UROBILINOGEN 0.2 01/26/2013 0704   NITRITE NEGATIVE 05/26/2018 1354   LEUKOCYTESUR NEGATIVE 05/26/2018 1354   Sepsis Labs Invalid input(s): PROCALCITONIN,  WBC,  LACTICIDVEN Microbiology Recent Results (from the past 240 hour(s))  Surgical pcr screen     Status: None   Collection Time: 05/25/18  5:21 AM  Result Value Ref Range Status   MRSA, PCR NEGATIVE NEGATIVE Final   Staphylococcus aureus NEGATIVE NEGATIVE Final    Comment: (NOTE) The Xpert SA Assay (FDA approved for NASAL specimens in patients 27 years of age and older), is one component of a comprehensive surveillance program. It is not intended to diagnose infection nor to guide or monitor  treatment. Performed at [facility], [place] [address] [place], [place] [address]      Time coordinating discharge: Over 30 minutes  SIGNED:   Beverely Pace, MD  [facility] 05/31/2018, 10:35 AM  If 7PM-7AM, please contact night-coverage [email or web address] Password [email or web address]

## 2018-05-31 NOTE — Assessment & Plan Note (Signed)
Metastatic triple negative breast cancer 1.  Cord compression status post surgery: She will need palliative radiation 2. pericardial effusion: Status post pericardial window placement 05/26/2018  Treatment plan: 1.  Palliative radiation therapy   2.  Followed by palliative chemotherapy with [place] days 1 and 8 every 3 weeks I counseled her regarding risks and benefits of follow-up and she understands this completely.  The goal of treatment: Palliation Return to clinic to begin chemotherapy after radiation is complete.

## 2018-05-31 NOTE — Progress Notes (Signed)
START OFF PATHWAY REGIMEN - Breast   [id number]:Eribulin 1.4 mg/m2 D1, 8 q21 Days:   A cycle is every 21 days:     Eribulin mesylate   **Always confirm dose/schedule in your pharmacy ordering system**  Patient Characteristics: Distant Metastases or Locoregional Recurrent Disease - Unresected or Locally Advanced Unresectable Disease Progressing after Neoadjuvant and Local Therapies, HER2 Negative/Unknown/Equivocal, ER Negative/Unknown, Chemotherapy, Second Line Therapeutic Status: Distant Metastases BRCA Mutation Status: Absent ER Status: Negative (-) HER2 Status: Negative (-) PR Status: Negative (-) Line of Therapy: Second Line Intent of Therapy: Non-Curative / Palliative Intent, Discussed with Patient

## 2018-05-31 NOTE — Progress Notes (Addendum)
      [id number]       [place] [address]             [phone]        4 Days Post-Op Procedure(s) (LRB): SUBXYPHOID PERICARDIAL WINDOW (N/A) TRANSESOPHAGEAL ECHOCARDIOGRAM (TEE) (N/A)  Subjective: Patient hopes to get discharged this am as has to see oncologist today.  Objective: Vital signs in last 24 hours: Temp:  [98.3 F (36.8 C)-99.2 F (37.3 C)] 98.5 F (36.9 C) (11/19 0400) Pulse Rate:  [90-109] 90 (11/19 0400) Cardiac Rhythm: Normal sinus rhythm (11/18 1941) Resp:  [20-23] 22 (11/19 0400) BP: (102-114)/(65-85) 107/85 (11/19 0400) SpO2:  [100 %] 100 % (11/19 0400)   Current Weight  05/28/18 52.8 kg       Intake/Output from previous day: 11/18 0701 - 11/19 0700 In: 998.3 [P.O.:600; I.V.:398.3] Out: 600 [Urine:600]   Physical Exam:  Cardiovascular: RRR Pulmonary: Clear to auscultation bilaterally Abdomen: Soft, non tender, bowel sounds present. Wound: Clean and dry.  No erythema or signs of infection.  Lab Results: CBC: Recent Labs    05/29/18 0312 05/30/18 0307  WBC 15.2* 14.9*  HGB 8.7* 8.5*  HCT 30.3* 29.0*  PLT 471* 466*   BMET:  Recent Labs    05/29/18 0312 05/30/18 0307  NA 136 132*  K 4.2 3.7  CL 101 100  CO2 25 23  GLUCOSE 85 96  BUN 13 9  CREATININE 0.64 0.61  CALCIUM 8.5* 8.1*    PT/INR:  Lab Results  Component Value Date   INR 1.08 05/25/2018   INR 1.00 04/27/2018   INR 1.24 07/23/2015   ABG:  INR: Will add last result for INR, ABG once components are confirmed Will add last 4 CBG results once components are confirmed  Assessment/Plan:  1. CV - SR. Pathology showed: Pericardium, biopsy - DENSE FIBROUS TISSUE, NEGATIVE FOR CARCINOMA. Pericardium, biopsy - DENSE FIBROUS TISSUE, NEGATIVE FOR CARCINOMA. 2.  Pulmonary - On room air. Encourage incentive spirometer. 3.  Acute blood loss anemia - H and H yesterday 8.5 and 29 4. Will make arrangements for follow up 5. Management per  primary  Donielle M ZimmermanPA-C 05/31/2018,7:26 AM [phone] \ Incision clean, cxr clear Ok to DC home  patient examined and medical record reviewed,agree with above note. Tharon Aquas Trigt III 05/31/2018

## 2018-05-31 NOTE — Progress Notes (Signed)
Patient Care Team: [name], [place] as PCP - General (Family Medicine) Minus Breeding, MD as PCP - Cardiology (Cardiology) Nicholas Lose, MD as Consulting Physician (Hematology and Oncology) Kyung Rudd, MD as Consulting Physician (Radiation Oncology)  DIAGNOSIS:  Encounter Diagnoses  Name Primary?  . Malignant neoplasm of upper-outer quadrant of left breast in female, estrogen receptor negative ([place])   . Goals of care, counseling/discussion     SUMMARY OF ONCOLOGIC HISTORY:   Breast cancer of upper-outer quadrant of left female breast ([place])   07/02/2015 Mammogram    Left breast mass 5.3 x 5.2 x 2.7 cm at 1:30 position, multiple abnormal enlarged left axillary lymph nodes    07/09/2015 Initial Diagnosis    Left breast biopsy 1:30: IDC grade 3, ER PR 0%, HER-2 negative ratio 1.65, Ki-67 90%; left axillary lymph node biopsy: IDC grade 3 completely replacing the lymph node ER PR 0%, HER-2 negative ratio 1.22 Ki-67 90%    07/25/2015 -  Chemotherapy    Palliative chemotherapy: Carboplatin and gemcitabine day 1 and day 8 every 3 weeks ( metastatic breast cancer with extensive liver metastases and malignant recurrent ascites), switched to gemcitabine every 2 weeks    12/02/2015 Imaging    CT CAP: Decrease in the liver metastases now measuring 7 x 5 cm was previously 8.4 x 9.3 cm.    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 Metastatic deposit diffusely infiltrates the T3 and T4 vertebrae with pathologic fracture of the T3 vertebra contributing to cord compression. Extensive epidural tumor  spread seen dorsally from T1-T8 (see postcontrast sagittal imaging). T3-4 and T4-5 foraminal compromise by tumor. There is asymmetric left paravertebral extension which infiltrates the left third and fourth ribs.    04/29/2018 Surgery    T3 corpecotmy and anterior T2-4 instrumentation, pathology metastatic breast cancer CK7 is strong and diffusely positive. CK20, TTF-1, GATA 3, and Qualitative ER are negative    05/23/2018 Imaging    CT CAP: New large pericardial effusion, significant progression of metastatic disease with near replacement of liver with metastatic cancer, new pulmonary nodules, erosive bony changes T2-T3 left second and third ribs, right upper lobe?  Lymphangitic carcinomatosis    05/26/2018 Surgery    Pericardial window, pathology negative for malignancy     Breast cancer ([place])   12/24/2016 Initial Diagnosis    Breast cancer ([place])    01/10/2017 Genetic Testing    Genetic counseling and testing for hereditary cancer syndromes were performed on 01/05/2017. Results are negative for pathogenic mutations in 46 genes analyzed by [facility]'s Common Hereditary Cancers Panel. Results are dated 01/10/2017. Genes tested: APC, ATM, AXIN2, BARD1, BMPR1A, BRCA1, BRCA2, BRIP1, CDH1, CDKN2A, CHEK2, CTNNA1, DICER1, EPCAM, GREM1, HOXB13, KIT, MEN1, MLH1, MSH2, MSH3, MSH6, MUTYH, NBN, NF1, NTHL1, PALB2, PDGFRA, PMS2, POLD1, POLE, PTEN, RAD50, RAD51C, RAD51D, SDHA, SDHB, SDHC, SDHD, SMAD4, SMARCA4, STK11, TP53, TSC1, TSC2, and VHL.         CHIEF COMPLIANT: Follow-up after recent hospitalization for pericardial window  INTERVAL HISTORY: Dawn Haynes is a 37 year old with above-mentioned history of metastatic  relapsed negative breast cancer who presented to the hospital for shortness of breath and tachycardia and was found to have very large pericardial effusion.  She underwent pericardial window and has been discharged this morning.  She is here today to see me as well as radiation oncology.   She is in a wheelchair but apparently has been able to walk for short distances and climb up even stairs.  REVIEW OF SYSTEMS:   Constitutional: Denies fevers, chills or abnormal weight loss Eyes: Denies blurriness of vision Ears, nose, mouth, throat, and face: Denies mucositis or sore throat Respiratory: Denies cough, dyspnea or wheezes Cardiovascular: Denies palpitation, chest discomfort Gastrointestinal: Abdominal distention and enlarged liver Skin: Denies abnormal skin rashes Lymphatics: Denies new lymphadenopathy or easy bruising Neurological: Lower extremity weakness Behavioral/Psych: Mood is stable, no new changes  Extremities: No lower extremity edema   All other systems were reviewed with the patient and are negative.  I have reviewed the past medical history, past surgical history, social history and family history with the patient and they are unchanged from previous note.  ALLERGIES:  has No Known Allergies.  MEDICATIONS:  Current Outpatient Medications  Medication Sig Dispense Refill  . ALPHA LIPOIC ACID PO Take 1 capsule by mouth daily.    . cyclobenzaprine (FLEXERIL) 10 MG tablet Take 1 tablet (10 mg total) by mouth 3 (three) times daily as needed for muscle spasms. 30 tablet 0  . docusate sodium (COLACE) 100 MG capsule Take 1 capsule (100 mg total) by mouth 2 (two) times daily. 20 capsule 0  . Magnesium 400 MG TABS Take 400 mg by mouth 2 (two) times daily. 4 tablet 0  . metoprolol tartrate (LOPRESSOR) 25 MG tablet Take 1 tablet (25 mg total) by mouth 2 (two) times daily. 60 tablet 0  . Multiple Vitamin (MULTIVITAMIN) tablet Take 1 tablet by mouth daily.    Marland Kitchen oxyCODONE (OXY IR/ROXICODONE) 5 MG immediate release tablet Take 1 tablet (5 mg total) by mouth every 3 (three) hours as needed (pain). 30 tablet 0  . TURMERIC PO Take 1 capsule by mouth daily.     No current facility-administered medications for this visit.     PHYSICAL EXAMINATION: ECOG PERFORMANCE STATUS: 2  - Symptomatic, <50% confined to bed  Vitals:   05/31/18 1543  BP: 117/83  Pulse: (!) 104  Resp: 17  Temp: 100.1 F (37.8 C)  SpO2: 100%   Filed Weights   05/31/18 1543  Weight: 125 lb 4 oz (56.8 kg)    GENERAL:alert, no distress and comfortable SKIN: skin color, texture, turgor are normal, no rashes or significant lesions EYES: normal, Conjunctiva are pink and non-injected, sclera clear OROPHARYNX:no exudate, no erythema and lips, buccal mucosa, and tongue normal  NECK: supple, thyroid normal size, non-tender, without nodularity LYMPH:  no palpable lymphadenopathy in the cervical, axillary or inguinal LUNGS: clear to auscultation and percussion with normal breathing effort HEART: regular rate & rhythm and no murmurs and no lower extremity edema ABDOMEN: Hepatomegaly MUSCULOSKELETAL:no cyanosis of digits and no clubbing  NEURO: alert & oriented x 3 with fluent speech, lower extremity weakness due to cord compression, strength 3 out of 5 EXTREMITIES: No lower extremity edema   LABORATORY DATA:  I have reviewed the data as listed CMP Latest Ref Rng & Units 05/30/2018 05/29/2018 05/28/2018  Glucose 70 - 99 mg/dL 96 85 93  BUN 6 - 20 mg/dL '9 13 10  '$ Creatinine 0.44 - 1.00 mg/dL 0.61 0.64 <0.30(L)  Sodium 135 -  145 mmol/L 132(L) 136 135  Potassium 3.5 - 5.1 mmol/L 3.7 4.2 4.1  Chloride 98 - 111 mmol/L 100 101 101  CO2 22 - 32 mmol/L '23 25 27  '$ Calcium 8.9 - 10.3 mg/dL 8.1(L) 8.5(L) 8.4(L)  Total Protein 6.5 - 8.1 g/dL 6.3(L) 6.4(L) -  Total Bilirubin 0.3 - 1.2 mg/dL 0.5 0.8 -  Alkaline Phos 38 - 126 U/L 290(H) 254(H) -  AST 15 - 41 U/L 132(H) 133(H) -  ALT 0 - 44 U/L 49(H) 46(H) -    Lab Results  Component Value Date   WBC 14.9 (H) 05/30/2018   HGB 8.5 (L) 05/30/2018   HCT 29.0 (L) 05/30/2018   MCV 88.1 05/30/2018   PLT 466 (H) 05/30/2018   NEUTROABS 11.4 (H) 05/30/2018    ASSESSMENT & PLAN:  Breast cancer of upper-outer quadrant of left female breast  ([place]) Metastatic triple negative breast cancer 1.  Cord compression status post surgery: She will need palliative radiation 2. pericardial effusion: Status post pericardial window placement 05/26/2018  Treatment plan: 1.  Palliative radiation therapy   2.  Followed by palliative chemotherapy with [place]aven days 1 and 8 every 3 weeks I counseled her regarding risks and benefits of follow-up and she understands this completely.  The goal of treatment: Palliation Return to clinic to begin chemotherapy after radiation is complete.    Goals of care, counseling/discussion Patient understands that her cancer is very aggressive and that she has poor prognosis.  If she does not respond to chemotherapy then it is very likely that she will die within the next 6 months.  She has very young kids and she wants to fight this cancer as long and as hard as possible.    No orders of the defined types were placed in this encounter.  The patient has a good understanding of the overall plan. she agrees with it. she will call with any problems that may develop before the next visit here.   Harriette Ohara, MD 05/31/18

## 2018-05-31 NOTE — Assessment & Plan Note (Addendum)
Patient understands that her cancer is very aggressive and that she has poor prognosis.  If she does not respond to chemotherapy then it is very likely that she will die within the next 6 months.  She has very young kids and she wants to fight this cancer as long and as hard as possible.

## 2018-06-01 ENCOUNTER — Ambulatory Visit: Payer: Medicaid Other

## 2018-06-01 ENCOUNTER — Ambulatory Visit
Admission: RE | Admit: 2018-06-01 | Discharge: 2018-06-01 | Disposition: A | Discharge status: Home / Self Care | Payer: Medicaid Other | Source: Ambulatory Visit | Attending: Radiation Oncology | Admitting: Radiation Oncology

## 2018-06-01 ENCOUNTER — Telehealth: Payer: Self-pay | Admitting: Hematology and Oncology

## 2018-06-01 DIAGNOSIS — C7951 Secondary malignant neoplasm of bone: Secondary | ICD-10-CM | POA: Diagnosis not present

## 2018-06-01 DIAGNOSIS — C50912 Malignant neoplasm of unspecified site of left female breast: Secondary | ICD-10-CM | POA: Diagnosis not present

## 2018-06-01 DIAGNOSIS — Z510 Encounter for antineoplastic radiation therapy: Secondary | ICD-10-CM | POA: Diagnosis not present

## 2018-06-01 NOTE — Telephone Encounter (Signed)
No 11/19 los orders. °

## 2018-06-02 ENCOUNTER — Ambulatory Visit: Payer: Medicaid Other

## 2018-06-02 ENCOUNTER — Ambulatory Visit
Admission: RE | Admit: 2018-06-02 | Discharge: 2018-06-02 | Disposition: A | Discharge status: Home / Self Care | Payer: Medicaid Other | Source: Ambulatory Visit | Attending: Radiation Oncology | Admitting: Radiation Oncology

## 2018-06-02 DIAGNOSIS — Z510 Encounter for antineoplastic radiation therapy: Secondary | ICD-10-CM | POA: Diagnosis not present

## 2018-06-03 ENCOUNTER — Ambulatory Visit: Payer: Medicaid Other

## 2018-06-03 ENCOUNTER — Ambulatory Visit
Admission: RE | Admit: 2018-06-03 | Discharge: 2018-06-03 | Disposition: A | Discharge status: Home / Self Care | Payer: Medicaid Other | Source: Ambulatory Visit | Attending: Radiation Oncology | Admitting: Radiation Oncology

## 2018-06-03 DIAGNOSIS — Z510 Encounter for antineoplastic radiation therapy: Secondary | ICD-10-CM | POA: Diagnosis not present

## 2018-06-04 DIAGNOSIS — C7951 Secondary malignant neoplasm of bone: Secondary | ICD-10-CM | POA: Insufficient documentation

## 2018-06-04 NOTE — Progress Notes (Signed)
  Radiation Oncology         [phone] ________________________________  Name: Dawn Haynes MRN: [id number]  Date: 05/16/2018  DOB: Sep 30, 1980  SIMULATION AND TREATMENT PLANNING NOTE  DIAGNOSIS:     ICD-10-CM   1. Bone metastasis ([place]) C79.51      Site:  T-spine  NARRATIVE:  The patient was brought to the [facility].  Identity was confirmed.  All relevant records and images related to the planned course of therapy were reviewed.   Written consent to proceed with treatment was confirmed which was freely given after reviewing the details related to the planned course of therapy had been reviewed with the patient.  Then, the patient was set-up in a stable reproducible  supine position for radiation therapy.  CT images were obtained.  Surface markings were placed.     The CT images were loaded into the planning software.  Then the target and avoidance structures were contoured.  Treatment planning then occurred.  The radiation prescription was entered and confirmed.  A total of 3 complex treatment devices were fabricated which relate to the designed radiation treatment fields. Each of these customized fields/ complex treatment devices will be used on a daily basis during the radiation course. I have requested : 3D Simulation  I have requested a DVH of the following structures: GTV, lungs, heart, cord.   PLAN:  The patient will receive 30 Gy in 10 fractions.  ________________________________   Jodelle Gross, MD, PhD

## 2018-06-05 ENCOUNTER — Ambulatory Visit
Admission: RE | Admit: 2018-06-05 | Discharge: 2018-06-05 | Disposition: A | Discharge status: Home / Self Care | Payer: Medicaid Other | Source: Ambulatory Visit | Attending: Radiation Oncology | Admitting: Radiation Oncology

## 2018-06-05 DIAGNOSIS — Z510 Encounter for antineoplastic radiation therapy: Secondary | ICD-10-CM | POA: Diagnosis not present

## 2018-06-06 ENCOUNTER — Ambulatory Visit: Payer: Medicaid Other

## 2018-06-06 ENCOUNTER — Ambulatory Visit
Admission: RE | Admit: 2018-06-06 | Discharge: 2018-06-06 | Disposition: A | Discharge status: Home / Self Care | Payer: Medicaid Other | Source: Ambulatory Visit | Attending: Radiation Oncology | Admitting: Radiation Oncology

## 2018-06-06 DIAGNOSIS — Z510 Encounter for antineoplastic radiation therapy: Secondary | ICD-10-CM | POA: Diagnosis not present

## 2018-06-07 ENCOUNTER — Ambulatory Visit: Payer: Medicaid Other

## 2018-06-07 ENCOUNTER — Ambulatory Visit
Admission: RE | Admit: 2018-06-07 | Discharge: 2018-06-07 | Disposition: A | Discharge status: Home / Self Care | Payer: Medicaid Other | Source: Ambulatory Visit | Attending: Radiation Oncology | Admitting: Radiation Oncology

## 2018-06-07 DIAGNOSIS — Z510 Encounter for antineoplastic radiation therapy: Secondary | ICD-10-CM | POA: Diagnosis not present

## 2018-06-08 ENCOUNTER — Other Ambulatory Visit: Payer: Self-pay | Admitting: Radiation Oncology

## 2018-06-08 ENCOUNTER — Other Ambulatory Visit: Payer: Self-pay | Admitting: *Deleted

## 2018-06-08 ENCOUNTER — Ambulatory Visit
Admission: RE | Admit: 2018-06-08 | Discharge: 2018-06-08 | Disposition: A | Discharge status: Home / Self Care | Payer: Medicaid Other | Source: Ambulatory Visit | Attending: Radiation Oncology | Admitting: Radiation Oncology

## 2018-06-08 ENCOUNTER — Ambulatory Visit: Payer: Medicaid Other

## 2018-06-08 DIAGNOSIS — Z510 Encounter for antineoplastic radiation therapy: Secondary | ICD-10-CM | POA: Diagnosis not present

## 2018-06-08 DIAGNOSIS — I313 Pericardial effusion (noninflammatory): Secondary | ICD-10-CM

## 2018-06-08 DIAGNOSIS — I3139 Other pericardial effusion (noninflammatory): Secondary | ICD-10-CM

## 2018-06-08 MED ORDER — SUCRALFATE 1 G PO TABS: 1.0000 g | ORAL_TABLET | Freq: Three times a day (TID) | ORAL | 2 refills | Status: DC

## 2018-06-08 MED ORDER — OXYCODONE HCL 5 MG PO TABS: 5.0000 mg | ORAL_TABLET | ORAL | 0 refills | Status: DC | PRN

## 2018-06-09 ENCOUNTER — Ambulatory Visit: Payer: Medicaid Other

## 2018-06-10 ENCOUNTER — Ambulatory Visit: Payer: Medicaid Other

## 2018-06-13 ENCOUNTER — Other Ambulatory Visit: Payer: Self-pay

## 2018-06-13 ENCOUNTER — Ambulatory Visit
Admission: RE | Admit: 2018-06-13 | Discharge: 2018-06-13 | Disposition: A | Discharge status: Home / Self Care | Payer: Medicaid Other | Source: Ambulatory Visit | Attending: Cardiothoracic Surgery | Admitting: Cardiothoracic Surgery

## 2018-06-13 ENCOUNTER — Telehealth: Payer: Self-pay

## 2018-06-13 ENCOUNTER — Ambulatory Visit (INDEPENDENT_AMBULATORY_CARE_PROVIDER_SITE_OTHER): Payer: Self-pay | Admitting: Physician Assistant

## 2018-06-13 ENCOUNTER — Encounter: Payer: Self-pay | Admitting: Radiation Oncology

## 2018-06-13 ENCOUNTER — Ambulatory Visit
Admission: RE | Admit: 2018-06-13 | Discharge: 2018-06-13 | Disposition: A | Discharge status: Home / Self Care | Payer: Medicaid Other | Source: Ambulatory Visit | Attending: Radiation Oncology | Admitting: Radiation Oncology

## 2018-06-13 ENCOUNTER — Other Ambulatory Visit: Payer: Self-pay | Admitting: Radiation Oncology

## 2018-06-13 VITALS — BP 108/78 | HR 110 | Resp 18 | Ht 63.0 in | Wt 115.8 lb

## 2018-06-13 DIAGNOSIS — I3139 Other pericardial effusion (noninflammatory): Secondary | ICD-10-CM

## 2018-06-13 DIAGNOSIS — C50912 Malignant neoplasm of unspecified site of left female breast: Secondary | ICD-10-CM | POA: Insufficient documentation

## 2018-06-13 DIAGNOSIS — C7951 Secondary malignant neoplasm of bone: Secondary | ICD-10-CM | POA: Insufficient documentation

## 2018-06-13 DIAGNOSIS — Z510 Encounter for antineoplastic radiation therapy: Secondary | ICD-10-CM | POA: Insufficient documentation

## 2018-06-13 DIAGNOSIS — I313 Pericardial effusion (noninflammatory): Secondary | ICD-10-CM

## 2018-06-13 MED ORDER — LIDOCAINE VISCOUS HCL 2 % MT SOLN: 15.0000 mL | OROMUCOSAL | 1 refills | Status: AC | PRN

## 2018-06-13 NOTE — Progress Notes (Signed)
  HPI: Patient returns for routine postoperative follow-up having undergone Sub Xiphoid Pericardial Window on 05/27/2018.  She is currently being treated for Breast Cancer.. Cytology and Pathology were both negative for malignant cells.  The patient's early postoperative recovery while in the hospital was as expected.  Since hospital discharge the patient reports no complaints from her procedure.  She states she continues to feel poorly due to her other medical issues.   Current Outpatient Medications  Medication Sig Dispense Refill  . ALPHA LIPOIC ACID PO Take 1 capsule by mouth daily.    . cyclobenzaprine (FLEXERIL) 10 MG tablet Take 1 tablet (10 mg total) by mouth 3 (three) times daily as needed for muscle spasms. 30 tablet 0  . docusate sodium (COLACE) 100 MG capsule Take 1 capsule (100 mg total) by mouth 2 (two) times daily. 20 capsule 0  . lidocaine-prilocaine (EMLA) cream Apply to affected area once 30 g 3  . LORazepam (ATIVAN) 0.5 MG tablet Take 1 tablet (0.5 mg total) by mouth at bedtime as needed for sleep. 30 tablet 0  . Magnesium 400 MG TABS Take 400 mg by mouth 2 (two) times daily. 4 tablet 0  . metoprolol tartrate (LOPRESSOR) 25 MG tablet Take 1 tablet (25 mg total) by mouth 2 (two) times daily. 60 tablet 0  . Multiple Vitamin (MULTIVITAMIN) tablet Take 1 tablet by mouth daily.    . ondansetron (ZOFRAN) 8 MG tablet Take 1 tablet (8 mg total) by mouth 2 (two) times daily as needed (Nausea or vomiting). 30 tablet 1  . oxyCODONE (OXY IR/ROXICODONE) 5 MG immediate release tablet Take 1 tablet (5 mg total) by mouth every 3 (three) hours as needed (pain). 30 tablet 0  . prochlorperazine (COMPAZINE) 10 MG tablet Take 1 tablet (10 mg total) by mouth every 6 (six) hours as needed (Nausea or vomiting). 30 tablet 1  . sucralfate (CARAFATE) 1 g tablet Take 1 tablet (1 g total) by mouth 4 (four) times daily -  with meals and at bedtime. 5 min before meals for radiation induced esophagitis 120 tablet  2  . TURMERIC PO Take 1 capsule by mouth daily.     No current facility-administered medications for this visit.     Physical Exam:  BP 108/78 (BP Location: Right Arm, Patient Position: Sitting, Cuff Size: Small)   Pulse (!) 110   Resp 18   Ht 5\' 3"  (1.6 m)   Wt 115 lb 12.8 oz (52.5 kg)   SpO2 100% Comment: RA  BMI 20.51 kg/m   Gen: no apparent distress, she is jaundiced Heart: RRR Lungs: CTA bilaterally Incisions: well healed  Diagnostic Tests:  CXR: stable, no significant pleural effusions present   1. S/P Subxiphoid pericardial window- cytology/pathology negative for malignancy.  Incision is well healed 2. Breast Cancer per Oncology 3. RTC prn   Ellwood Handler, PA-C [facility] [phone]

## 2018-06-13 NOTE — Telephone Encounter (Signed)
Returned patient's call.  Patient left voicemail with concerns of pain and medication not being effective.   Nurse reviewed - pain medication was prescribed by Dr. Tammi Klippel.  Patient was seen by PA in radiation today and lidocaine solution was prescribed.   Nurse returned called.  No answer, left voicemail for patient to return call to follow up.

## 2018-06-13 NOTE — Progress Notes (Signed)
Dawn Haynes was seen today for a work in visit due to increasing abdominal pain, and esophageal pain.  She is currently receiving palliative radiation to T1-T8 thoracic spine.  She has tried Carafate infrequently without success, and is currently taking oxycodone 5 mg tablets 1 every 3 hours as needed pain.  She states that this is hardly taking care of any of her abdominal pain which is due to her known advanced liver disease from her breast cancer.  The plan is for her to proceed with palliative radiotherapy followed by systemic treatment under the care of Dr. [name].  Unfortunately her case has been recently complicated by development of a malignant pericardial effusion.  This was treated surgically with AP window about a week ago.  The patient has 1 additional fraction of radiotherapy to complete tomorrow.  In the clinic her pulse rate is 112 which is improved compared to prior to her AP window procedure.  Her blood pressure is 103/78, she is saturating at 100% on room air, respiratory rate is 20, and temperature is 98.5 degrees.  She appears to be a cachectic, chronically ill-appearing African-American female in no acute distress.  In discussing her case, I am concerned that she is starting to show signs of dehydration with her blood pressure and pulse rate, versus continued issues with her pericardial tissues.  While attempting to offer IV fluid hydration, given the time of day, and the need to contact her cardiothoracic surgeon, and medical oncologist, I have encouraged her to try and increase oral intake in the next 12 hours.  I have prescribed viscous lidocaine 2% to be utilized and given her precautions against aspiration.  She will also increase her oxycodone to 10 mg every 3-4 hours as needed pain.  I am suspicious that she might need long-acting pain relief given her scenario, but I am hesitant to make adjustments currently based on her other ongoing medical issues.  I will reach out to CT surgery and  medical oncology to determine how they would like to move forward.     Carola Rhine, PAC

## 2018-06-14 ENCOUNTER — Telehealth: Payer: Self-pay | Admitting: Hematology and Oncology

## 2018-06-14 ENCOUNTER — Inpatient Hospital Stay: Payer: Medicaid Other | Attending: Hematology and Oncology | Admitting: Hematology and Oncology

## 2018-06-14 ENCOUNTER — Ambulatory Visit
Admission: RE | Admit: 2018-06-14 | Discharge: 2018-06-14 | Disposition: A | Discharge status: Home / Self Care | Payer: Medicaid Other | Source: Ambulatory Visit | Attending: Radiation Oncology | Admitting: Radiation Oncology

## 2018-06-14 ENCOUNTER — Telehealth: Payer: Self-pay

## 2018-06-14 ENCOUNTER — Other Ambulatory Visit: Payer: Self-pay

## 2018-06-14 DIAGNOSIS — Z923 Personal history of irradiation: Secondary | ICD-10-CM | POA: Insufficient documentation

## 2018-06-14 DIAGNOSIS — C7951 Secondary malignant neoplasm of bone: Secondary | ICD-10-CM

## 2018-06-14 DIAGNOSIS — C50412 Malignant neoplasm of upper-outer quadrant of left female breast: Secondary | ICD-10-CM | POA: Diagnosis present

## 2018-06-14 DIAGNOSIS — Z171 Estrogen receptor negative status [ER-]: Secondary | ICD-10-CM

## 2018-06-14 DIAGNOSIS — C787 Secondary malignant neoplasm of liver and intrahepatic bile duct: Secondary | ICD-10-CM

## 2018-06-14 DIAGNOSIS — Z79899 Other long term (current) drug therapy: Secondary | ICD-10-CM | POA: Diagnosis not present

## 2018-06-14 DIAGNOSIS — Z9221 Personal history of antineoplastic chemotherapy: Secondary | ICD-10-CM | POA: Diagnosis not present

## 2018-06-14 DIAGNOSIS — Z510 Encounter for antineoplastic radiation therapy: Secondary | ICD-10-CM | POA: Diagnosis not present

## 2018-06-14 NOTE — Progress Notes (Signed)
Per Dr.Gudena, referral to palliative care for home. Will call [facility] for referral.

## 2018-06-14 NOTE — Assessment & Plan Note (Addendum)
Metastatic triple negative breast cancer 1.  Cord compression status post surgery: She will need palliative radiation 2. pericardial effusion: Status post pericardial window placement 05/26/2018 3. Palliative radiation therapy completed 06/14/2018  Treatment plan: Palliative chemotherapy with [place] days 1 and 8 every 3 weeks to start next week.  I counseled her about chemotherapy related adverse effects. Patient will need a port insertion to start chemotherapy next week.  I counseled her regarding risks and benefits of follow-up and she understands this completely.  Radiation-induced esophagitis: Much improved with lidocaine Severe right upper quadrant abdominal pain due to massive liver metastases, patient is on narcotic pain medication The goal of treatment: Palliation Will consult palliative care

## 2018-06-14 NOTE — Progress Notes (Signed)
Patient Care Team: [name], [place] as PCP - General (Family Medicine) Minus Breeding, MD as PCP - Cardiology (Cardiology) Nicholas Lose, MD as Consulting Physician (Hematology and Oncology) Kyung Rudd, MD as Consulting Physician (Radiation Oncology)  DIAGNOSIS:    ICD-10-CM   1. Metastasis to liver ([place]) C78.7 IR Perc Tun Perit Cath W/Port  2. Malignant neoplasm of upper-outer quadrant of left breast in female, estrogen receptor negative ([name]) C50.412 IR Perc Tun Perit Cath W/Port   Z17.1     SUMMARY OF ONCOLOGIC HISTORY:   Breast cancer of upper-outer quadrant of left female breast ([place])   07/02/2015 Mammogram    Left breast mass 5.3 x 5.2 x 2.7 cm at 1:30 position, multiple abnormal enlarged left axillary lymph nodes    07/09/2015 Initial Diagnosis    Left breast biopsy 1:30: IDC grade 3, ER PR 0%, HER-2 negative ratio 1.65, Ki-67 90%; left axillary lymph node biopsy: IDC grade 3 completely replacing the lymph node ER PR 0%, HER-2 negative ratio 1.22 Ki-67 90%    07/25/2015 -  Chemotherapy    Palliative chemotherapy: Carboplatin and gemcitabine day 1 and day 8 every 3 weeks ( metastatic breast cancer with extensive liver metastases and malignant recurrent ascites), switched to gemcitabine every 2 weeks    12/02/2015 Imaging    CT CAP: Decrease in the liver metastases now measuring 7 x 5 cm was previously 8.4 x 9.3 cm.    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 Metastatic deposit diffusely infiltrates the T3 and T4 vertebrae with pathologic fracture of the T3 vertebra  contributing to cord compression. Extensive epidural tumor spread seen dorsally from T1-T8 (see postcontrast sagittal imaging). T3-4 and T4-5 foraminal compromise by tumor. There is asymmetric left paravertebral extension which infiltrates the left third and fourth ribs.    04/29/2018 Surgery    T3 corpecotmy and anterior T2-4 instrumentation, pathology metastatic breast cancer CK7 is strong and diffusely positive. CK20, TTF-1, GATA 3, and Qualitative ER are negative    05/23/2018 Imaging    CT CAP: New large pericardial effusion, significant progression of metastatic disease with near replacement of liver with metastatic cancer, new pulmonary nodules, erosive bony changes T2-T3 left second and third ribs, right upper lobe?  Lymphangitic carcinomatosis    05/26/2018 Surgery    Pericardial window, pathology negative for malignancy    05/31/2018 -  Chemotherapy    The patient had eriBULin mesylate (HALAVEN) 2.25 mg in sodium chloride 0.9 % 100 mL chemo infusion, 1.4 mg/m2 = 2.25 mg, Intravenous,  Once, 0 of 6 cycles  for chemotherapy treatment.      Breast cancer ([place])   12/24/2016 Initial Diagnosis    Breast cancer ([place])    01/10/2017 Genetic Testing    Genetic counseling and testing for hereditary cancer syndromes were performed on 01/05/2017. Results are negative for pathogenic mutations in 46 genes analyzed by [facility]'s Common Hereditary Cancers Panel. Results are dated 01/10/2017. Genes tested: APC, ATM, AXIN2, BARD1, BMPR1A, BRCA1, BRCA2, BRIP1, CDH1, CDKN2A, CHEK2, CTNNA1, DICER1, EPCAM, GREM1, HOXB13, KIT, MEN1,  MLH1, MSH2, MSH3, MSH6, MUTYH, NBN, NF1, NTHL1, PALB2, PDGFRA, PMS2, POLD1, POLE, PTEN, RAD50, RAD51C, RAD51D, SDHA, SDHB, SDHC, SDHD, SMAD4, SMARCA4, STK11, TP53, TSC1, TSC2, and VHL.         CHIEF COMPLIANT: Follow-up for left breast cancer following radiation  INTERVAL HISTORY: Dawn Haynes is a 37 y.o. with above-mentioned history of metastatic relapsed negative  breast cancer. She presents to the clinic today with her family. She is using a wheelchair to get around. She has been mostly eating protein drinks but has had decreased appetite. She notes her pain is a 1 on a scale of 1-10, and that she has been taking Lorezepam, and that she can sleep well without aches on the left side of her abdomen. She notes her abdomen is distended to the same degree as previously. She reports normal bowel movements.  She has massive hepatomegaly with liver capsular pain.  REVIEW OF SYSTEMS:   Constitutional: Denies fevers, chills or abnormal weight loss (+) decreased appetite Eyes: Denies blurriness of vision Ears, nose, mouth, throat, and face: Esophagitis due to radiation Respiratory: Denies cough, dyspnea or wheezes Cardiovascular: Denies palpitation, chest discomfort Gastrointestinal: Abdominal pain Skin: Denies abnormal skin rashes Lymphatics: Denies new lymphadenopathy or easy bruising Neurological: Cord compression with lower extremity weakness Behavioral/Psych: Weak and frail appearing Extremities: No lower extremity edema All other systems were reviewed with the patient and are negative.  I have reviewed the past medical history, past surgical history, social history and family history with the patient and they are unchanged from previous note.  ALLERGIES:  has No Known Allergies.  MEDICATIONS:  Current Outpatient Medications  Medication Sig Dispense Refill  . ALPHA LIPOIC ACID PO Take 1 capsule by mouth daily.    Marland Kitchen docusate sodium (COLACE) 100 MG capsule Take 1 capsule (100 mg total) by mouth 2 (two) times daily. 20 capsule 0  . lidocaine (XYLOCAINE) 2 % solution Use as directed 15 mLs in the mouth or throat every 4 (four) hours as needed (for esophagitis). 200 mL 1  . lidocaine-prilocaine (EMLA) cream Apply to affected area once 30 g 3  . LORazepam (ATIVAN) 0.5 MG tablet Take 1 tablet (0.5 mg total) by mouth at bedtime as needed for sleep. 30 tablet 0    . Magnesium 400 MG TABS Take 400 mg by mouth 2 (two) times daily. 4 tablet 0  . metoprolol tartrate (LOPRESSOR) 25 MG tablet Take 1 tablet (25 mg total) by mouth 2 (two) times daily. 60 tablet 0  . Multiple Vitamin (MULTIVITAMIN) tablet Take 1 tablet by mouth daily.    . ondansetron (ZOFRAN) 8 MG tablet Take 1 tablet (8 mg total) by mouth 2 (two) times daily as needed (Nausea or vomiting). 30 tablet 1  . oxyCODONE (OXY IR/ROXICODONE) 5 MG immediate release tablet Take 1 tablet (5 mg total) by mouth every 3 (three) hours as needed (pain). 30 tablet 0  . prochlorperazine (COMPAZINE) 10 MG tablet Take 1 tablet (10 mg total) by mouth every 6 (six) hours as needed (Nausea or vomiting). 30 tablet 1  . sucralfate (CARAFATE) 1 g tablet Take 1 tablet (1 g total) by mouth 4 (four) times daily -  with meals and at bedtime. 5 min before meals for radiation induced esophagitis 120 tablet 2  . TURMERIC PO Take 1 capsule by mouth daily.     No current facility-administered medications for this visit.     PHYSICAL EXAMINATION: ECOG PERFORMANCE STATUS: 3 - Symptomatic, >50% confined to bed  There were no vitals filed for this visit. There were no vitals filed for this visit.  GENERAL:alert, no distress and comfortable SKIN: skin color, texture, turgor are normal, no rashes or significant lesions EYES: normal, Conjunctiva are pink and non-injected, sclera clear OROPHARYNX:no exudate, no erythema and lips, buccal mucosa, and tongue normal  NECK: supple, thyroid normal size, non-tender, without nodularity LYMPH:  no palpable lymphadenopathy in the cervical, axillary or inguinal LUNGS: clear to auscultation and percussion with normal breathing effort HEART: regular rate & rhythm and no murmurs and no lower extremity edema ABDOMEN:abdomen soft, non-tender and normal bowel sounds MUSCULOSKELETAL:no cyanosis of digits and no clubbing  NEURO: alert & oriented x 3 with fluent speech, no focal motor/sensory  deficits EXTREMITIES: No lower extremity edema  LABORATORY DATA:  I have reviewed the data as listed CMP Latest Ref Rng & Units 05/30/2018 05/29/2018 05/28/2018  Glucose 70 - 99 mg/dL 96 85 93  BUN 6 - 20 mg/dL '9 13 10  '$ Creatinine 0.44 - 1.00 mg/dL 0.61 0.64 <0.30(L)  Sodium 135 - 145 mmol/L 132(L) 136 135  Potassium 3.5 - 5.1 mmol/L 3.7 4.2 4.1  Chloride 98 - 111 mmol/L 100 101 101  CO2 22 - 32 mmol/L '23 25 27  '$ Calcium 8.9 - 10.3 mg/dL 8.1(L) 8.5(L) 8.4(L)  Total Protein 6.5 - 8.1 g/dL 6.3(L) 6.4(L) -  Total Bilirubin 0.3 - 1.2 mg/dL 0.5 0.8 -  Alkaline Phos 38 - 126 U/L 290(H) 254(H) -  AST 15 - 41 U/L 132(H) 133(H) -  ALT 0 - 44 U/L 49(H) 46(H) -    Lab Results  Component Value Date   WBC 14.9 (H) 05/30/2018   HGB 8.5 (L) 05/30/2018   HCT 29.0 (L) 05/30/2018   MCV 88.1 05/30/2018   PLT 466 (H) 05/30/2018   NEUTROABS 11.4 (H) 05/30/2018    ASSESSMENT & PLAN:  Breast cancer of upper-outer quadrant of left female breast ([place]) Metastatic triple negative breast cancer 1.  Cord compression status post surgery: She will need palliative radiation 2. pericardial effusion: Status post pericardial window placement 05/26/2018 3. Palliative radiation therapy completed 06/14/2018  Treatment plan: Palliative chemotherapy with [place] days 1 and 8 every 3 weeks to start next week.  I counseled her about chemotherapy related adverse effects. Patient will need a port insertion to start chemotherapy next week.  I counseled her regarding risks and benefits of follow-up and she understands this completely.  Radiation-induced esophagitis: Much improved with lidocaine Severe right upper quadrant abdominal pain due to massive liver metastases, patient is on narcotic pain medication The goal of treatment: Palliation Will consult palliative care    Orders Placed This Encounter  Procedures  . IR Perc Tun Perit Cath W/Port    Standing Status:   Future    Standing Expiration Date:    08/16/2019    Order Specific Question:   Reason for exam:    Answer:   Metastatic breast cancer need for IV access for chemotherapy    Order Specific Question:   Is the patient pregnant?    Answer:   No    Order Specific Question:   Preferred Imaging Location?    Answer:   [facility]   The patient has a good understanding of the overall plan. she agrees with it. she will call with any problems that may develop before the next visit here.  Nicholas Lose, MD 06/14/2018   Julious Oka Dorshimer, am acting as scribe for Nicholas Lose, MD.  I  have reviewed the above documentation for accuracy and completeness, and I agree with the above.

## 2018-06-14 NOTE — Telephone Encounter (Signed)
Gave patient avs report and appointments for December/January. [facility] will call re port placement.

## 2018-06-14 NOTE — Telephone Encounter (Signed)
na

## 2018-06-15 ENCOUNTER — Observation Stay (HOSPITAL_COMMUNITY): Payer: Medicaid Other

## 2018-06-15 ENCOUNTER — Inpatient Hospital Stay (HOSPITAL_COMMUNITY)
Admission: EM | Admit: 2018-06-15 | Discharge: 2018-06-19 | DRG: 947 | Disposition: A | Discharge status: Hospice | Payer: Medicaid Other | Attending: Internal Medicine | Admitting: Internal Medicine

## 2018-06-15 ENCOUNTER — Telehealth: Payer: Self-pay

## 2018-06-15 ENCOUNTER — Encounter (HOSPITAL_COMMUNITY): Payer: Self-pay | Admitting: Emergency Medicine

## 2018-06-15 ENCOUNTER — Emergency Department (HOSPITAL_COMMUNITY): Payer: Medicaid Other

## 2018-06-15 ENCOUNTER — Other Ambulatory Visit: Payer: Self-pay

## 2018-06-15 DIAGNOSIS — Z6821 Body mass index (BMI) 21.0-21.9, adult: Secondary | ICD-10-CM

## 2018-06-15 DIAGNOSIS — I313 Pericardial effusion (noninflammatory): Secondary | ICD-10-CM | POA: Diagnosis present

## 2018-06-15 DIAGNOSIS — E162 Hypoglycemia, unspecified: Secondary | ICD-10-CM | POA: Diagnosis present

## 2018-06-15 DIAGNOSIS — R109 Unspecified abdominal pain: Secondary | ICD-10-CM | POA: Diagnosis present

## 2018-06-15 DIAGNOSIS — B3781 Candidal esophagitis: Secondary | ICD-10-CM | POA: Diagnosis present

## 2018-06-15 DIAGNOSIS — Z8249 Family history of ischemic heart disease and other diseases of the circulatory system: Secondary | ICD-10-CM

## 2018-06-15 DIAGNOSIS — C7951 Secondary malignant neoplasm of bone: Secondary | ICD-10-CM | POA: Diagnosis present

## 2018-06-15 DIAGNOSIS — E440 Moderate protein-calorie malnutrition: Secondary | ICD-10-CM

## 2018-06-15 DIAGNOSIS — C50412 Malignant neoplasm of upper-outer quadrant of left female breast: Secondary | ICD-10-CM | POA: Diagnosis present

## 2018-06-15 DIAGNOSIS — Z923 Personal history of irradiation: Secondary | ICD-10-CM

## 2018-06-15 DIAGNOSIS — I9589 Other hypotension: Secondary | ICD-10-CM | POA: Diagnosis present

## 2018-06-15 DIAGNOSIS — R945 Abnormal results of liver function studies: Secondary | ICD-10-CM | POA: Diagnosis not present

## 2018-06-15 DIAGNOSIS — R188 Other ascites: Secondary | ICD-10-CM | POA: Diagnosis present

## 2018-06-15 DIAGNOSIS — R180 Malignant ascites: Secondary | ICD-10-CM | POA: Diagnosis not present

## 2018-06-15 DIAGNOSIS — Z9049 Acquired absence of other specified parts of digestive tract: Secondary | ICD-10-CM

## 2018-06-15 DIAGNOSIS — E871 Hypo-osmolality and hyponatremia: Secondary | ICD-10-CM | POA: Diagnosis present

## 2018-06-15 DIAGNOSIS — R64 Cachexia: Secondary | ICD-10-CM | POA: Diagnosis present

## 2018-06-15 DIAGNOSIS — I959 Hypotension, unspecified: Secondary | ICD-10-CM | POA: Diagnosis present

## 2018-06-15 DIAGNOSIS — C50919 Malignant neoplasm of unspecified site of unspecified female breast: Secondary | ICD-10-CM

## 2018-06-15 DIAGNOSIS — Z801 Family history of malignant neoplasm of trachea, bronchus and lung: Secondary | ICD-10-CM

## 2018-06-15 DIAGNOSIS — M5136 Other intervertebral disc degeneration, lumbar region: Secondary | ICD-10-CM | POA: Diagnosis present

## 2018-06-15 DIAGNOSIS — R1084 Generalized abdominal pain: Secondary | ICD-10-CM | POA: Diagnosis not present

## 2018-06-15 DIAGNOSIS — K5900 Constipation, unspecified: Secondary | ICD-10-CM | POA: Diagnosis present

## 2018-06-15 DIAGNOSIS — Z79891 Long term (current) use of opiate analgesic: Secondary | ICD-10-CM

## 2018-06-15 DIAGNOSIS — R000 Tachycardia, unspecified: Secondary | ICD-10-CM | POA: Diagnosis present

## 2018-06-15 DIAGNOSIS — E860 Dehydration: Secondary | ICD-10-CM | POA: Diagnosis present

## 2018-06-15 DIAGNOSIS — Z993 Dependence on wheelchair: Secondary | ICD-10-CM

## 2018-06-15 DIAGNOSIS — G9529 Other cord compression: Secondary | ICD-10-CM | POA: Diagnosis present

## 2018-06-15 DIAGNOSIS — R740 Nonspecific elevation of levels of transaminase and lactic acid dehydrogenase [LDH]: Secondary | ICD-10-CM

## 2018-06-15 DIAGNOSIS — Z79899 Other long term (current) drug therapy: Secondary | ICD-10-CM

## 2018-06-15 DIAGNOSIS — Z171 Estrogen receptor negative status [ER-]: Secondary | ICD-10-CM

## 2018-06-15 DIAGNOSIS — K7689 Other specified diseases of liver: Secondary | ICD-10-CM

## 2018-06-15 DIAGNOSIS — C7800 Secondary malignant neoplasm of unspecified lung: Secondary | ICD-10-CM | POA: Diagnosis present

## 2018-06-15 DIAGNOSIS — R7401 Elevation of levels of liver transaminase levels: Secondary | ICD-10-CM | POA: Diagnosis present

## 2018-06-15 DIAGNOSIS — G893 Neoplasm related pain (acute) (chronic): Principal | ICD-10-CM | POA: Diagnosis present

## 2018-06-15 DIAGNOSIS — Z833 Family history of diabetes mellitus: Secondary | ICD-10-CM

## 2018-06-15 DIAGNOSIS — E875 Hyperkalemia: Secondary | ICD-10-CM | POA: Diagnosis present

## 2018-06-15 DIAGNOSIS — K7200 Acute and subacute hepatic failure without coma: Secondary | ICD-10-CM | POA: Diagnosis present

## 2018-06-15 DIAGNOSIS — Z66 Do not resuscitate: Secondary | ICD-10-CM | POA: Diagnosis not present

## 2018-06-15 DIAGNOSIS — E872 Acidosis: Secondary | ICD-10-CM | POA: Diagnosis present

## 2018-06-15 DIAGNOSIS — R160 Hepatomegaly, not elsewhere classified: Secondary | ICD-10-CM | POA: Diagnosis present

## 2018-06-15 DIAGNOSIS — Z7401 Bed confinement status: Secondary | ICD-10-CM

## 2018-06-15 DIAGNOSIS — I10 Essential (primary) hypertension: Secondary | ICD-10-CM | POA: Diagnosis present

## 2018-06-15 DIAGNOSIS — C787 Secondary malignant neoplasm of liver and intrahepatic bile duct: Secondary | ICD-10-CM | POA: Diagnosis present

## 2018-06-15 DIAGNOSIS — K0889 Other specified disorders of teeth and supporting structures: Secondary | ICD-10-CM | POA: Diagnosis present

## 2018-06-15 DIAGNOSIS — Z8619 Personal history of other infectious and parasitic diseases: Secondary | ICD-10-CM

## 2018-06-15 DIAGNOSIS — Z515 Encounter for palliative care: Secondary | ICD-10-CM

## 2018-06-15 DIAGNOSIS — R7989 Other specified abnormal findings of blood chemistry: Secondary | ICD-10-CM | POA: Diagnosis not present

## 2018-06-15 DIAGNOSIS — E861 Hypovolemia: Secondary | ICD-10-CM

## 2018-06-15 DIAGNOSIS — Z9012 Acquired absence of left breast and nipple: Secondary | ICD-10-CM

## 2018-06-15 LAB — PHOSPHORUS: Phosphorus: 3.4 mg/dL (ref 2.5–4.6)

## 2018-06-15 LAB — BASIC METABOLIC PANEL
Anion gap: 16 — ABNORMAL HIGH (ref 5–15)
BUN: 26 mg/dL — ABNORMAL HIGH (ref 6–20)
CHLORIDE: 92 mmol/L — AB (ref 98–111)
CO2: 23 mmol/L (ref 22–32)
Calcium: 8.4 mg/dL — ABNORMAL LOW (ref 8.9–10.3)
Creatinine, Ser: 0.48 mg/dL (ref 0.44–1.00)
GFR calc Af Amer: >60 mL/min (ref >60)
GFR calc non Af Amer: >60 mL/min (ref >60)
Glucose, Bld: 87 mg/dL (ref 70–99)
Potassium: 5 mmol/L (ref 3.5–5.1)
Sodium: 131 mmol/L — ABNORMAL LOW (ref 135–145)

## 2018-06-15 LAB — CBC WITH DIFFERENTIAL/PLATELET
Abs Immature Granulocytes: 0.2 10*3/uL — ABNORMAL HIGH (ref 0.00–0.07)
Basophils Absolute: 0 10*3/uL (ref 0.0–0.1)
Basophils Relative: 0 %
Eosinophils Absolute: 0 10*3/uL (ref 0.0–0.5)
Eosinophils Relative: 0 %
HCT: 34.5 % — ABNORMAL LOW (ref 36.0–46.0)
Hemoglobin: 10.6 g/dL — ABNORMAL LOW (ref 12.0–15.0)
Immature Granulocytes: 1 %
Lymphocytes Relative: 2 %
Lymphs Abs: 0.4 10*3/uL — ABNORMAL LOW (ref 0.7–4.0)
MCH: 25.1 pg — ABNORMAL LOW (ref 26.0–34.0)
MCHC: 30.7 g/dL (ref 30.0–36.0)
MCV: 81.8 fL (ref 80.0–100.0)
Monocytes Absolute: 1.8 10*3/uL — ABNORMAL HIGH (ref 0.1–1.0)
Monocytes Relative: 11 %
Neutro Abs: 13.6 10*3/uL — ABNORMAL HIGH (ref 1.7–7.7)
Neutrophils Relative %: 86 %
Platelets: 231 10*3/uL (ref 150–400)
RBC: 4.22 MIL/uL (ref 3.87–5.11)
RDW: 17.5 % — ABNORMAL HIGH (ref 11.5–15.5)
WBC: 16 10*3/uL — AB (ref 4.0–10.5)
nRBC: 0.3 % — ABNORMAL HIGH (ref 0.0–0.2)

## 2018-06-15 LAB — COMPREHENSIVE METABOLIC PANEL
ALT: 191 U/L — AB (ref 0–44)
AST: 674 U/L — AB (ref 15–41)
Albumin: 2.6 g/dL — ABNORMAL LOW (ref 3.5–5.0)
Alkaline Phosphatase: 768 U/L — ABNORMAL HIGH (ref 38–126)
Anion gap: 17 — ABNORMAL HIGH (ref 5–15)
BUN: 32 mg/dL — ABNORMAL HIGH (ref 6–20)
CO2: 26 mmol/L (ref 22–32)
Calcium: 9.6 mg/dL (ref 8.9–10.3)
Chloride: 86 mmol/L — ABNORMAL LOW (ref 98–111)
Creatinine, Ser: 0.44 mg/dL (ref 0.44–1.00)
Glucose, Bld: 68 mg/dL — ABNORMAL LOW (ref 70–99)
Potassium: 5.6 mmol/L — ABNORMAL HIGH (ref 3.5–5.1)
Sodium: 129 mmol/L — ABNORMAL LOW (ref 135–145)
Total Bilirubin: 6.6 mg/dL — ABNORMAL HIGH (ref 0.3–1.2)
Total Protein: 8.2 g/dL — ABNORMAL HIGH (ref 6.5–8.1)

## 2018-06-15 LAB — URINALYSIS, ROUTINE W REFLEX MICROSCOPIC
Bacteria, UA: NONE SEEN
Bilirubin Urine: NEGATIVE
Glucose, UA: 50 mg/dL — AB
Ketones, ur: NEGATIVE mg/dL
Leukocytes, UA: NEGATIVE
Nitrite: NEGATIVE
Protein, ur: NEGATIVE mg/dL
Specific Gravity, Urine: 1.018 (ref 1.005–1.030)
pH: 6 (ref 5.0–8.0)

## 2018-06-15 LAB — PROTIME-INR
INR: 1.68
Prothrombin Time: 19.6 seconds — ABNORMAL HIGH (ref 11.4–15.2)

## 2018-06-15 LAB — SODIUM, URINE, RANDOM: Sodium, Ur: <10 mmol/L

## 2018-06-15 LAB — LACTATE DEHYDROGENASE: LDH: 1812 U/L — AB (ref 98–192)

## 2018-06-15 LAB — MAGNESIUM: MAGNESIUM: 2.8 mg/dL — AB (ref 1.7–2.4)

## 2018-06-15 LAB — URIC ACID: Uric Acid, Serum: 6.5 mg/dL (ref 2.5–7.1)

## 2018-06-15 LAB — I-STAT BETA HCG BLOOD, ED (MC, WL, AP ONLY): I-stat hCG, quantitative: 6.5 m[IU]/mL — ABNORMAL HIGH (ref <5)

## 2018-06-15 LAB — LIPASE, BLOOD: Lipase: 35 U/L (ref 11–51)

## 2018-06-15 LAB — CBG MONITORING, ED
Glucose-Capillary: 48 mg/dL — ABNORMAL LOW (ref 70–99)
Glucose-Capillary: 81 mg/dL (ref 70–99)

## 2018-06-15 LAB — CREATININE, URINE, RANDOM: Creatinine, Urine: 66.42 mg/dL

## 2018-06-15 LAB — AMMONIA: Ammonia: 25 umol/L (ref 9–35)

## 2018-06-15 MED ORDER — SODIUM CHLORIDE 0.9 % IV BOLUS
1000.0000 mL | Freq: Once | INTRAVENOUS | Status: AC
  Administered 2018-06-15: 1000 mL via INTRAVENOUS

## 2018-06-15 MED ORDER — DEXTROSE 50 % IV SOLN
1.0000 | Freq: Once | INTRAVENOUS | Status: AC
  Administered 2018-06-15: 50 mL via INTRAVENOUS
  Filled 2018-06-15: qty 50

## 2018-06-15 MED ORDER — FENTANYL CITRATE (PF) 100 MCG/2ML IJ SOLN
50.0000 ug | INTRAMUSCULAR | Status: DC | PRN
  Administered 2018-06-15 – 2018-06-17 (×12): 50 ug via INTRAVENOUS
  Filled 2018-06-15 (×13): qty 2

## 2018-06-15 MED ORDER — THIAMINE HCL 100 MG/ML IJ SOLN
100.0000 mg | Freq: Once | INTRAMUSCULAR | Status: AC
  Administered 2018-06-15: 100 mg via INTRAVENOUS
  Filled 2018-06-15: qty 2

## 2018-06-15 MED ORDER — MAGIC MOUTHWASH
5.0000 mL | Freq: Three times a day (TID) | ORAL | Status: DC | PRN
  Filled 2018-06-15: qty 5

## 2018-06-15 NOTE — H&P (Addendum)
Dawn Haynes LKG:[id number] DOB: [birth year] DOA: 06/15/2018       PCP: [name], PA   Outpatient Specialists:   CAR[facility] Dr. Percival Spanish    Oncology   Dr. [name]  Rad/Onc Tammi Klippel   Patient arrived to ER on 06/15/18 at [place]  Patient coming from: home Lives With family    Chief Complaint:  Chief Complaint  Patient presents with  . Weakness    HPI: Dawn Haynes is a 37 y.o. female with medical history significant of metastatic  Breast CA , Thoracic tumor removal     Presented with a weakness no associated nausea vomiting. Patient started to have worsening pain which is severe and does not improve with home narcotics.  Mainly in the left lateral abdomen no fevers no associated chest pain or shortness of breath Patient have had decreased appetite has been started on lorazepam to help her sleep continues to have pain on the left side of her abdomen and has distended abdomen which is similar to prior she still continues to have bowel movements.  She has chronic abdominal pain secondary to massive   hepatomegaly and capsular pain Which of care has been consulted she is scheduled to see them tomorrow.  She has been more lethargic lately.   Regarding pertinent Chronic problems: pericardial effusion: Status post pericardial window placement 05/26/2018   Palliative radiation therapy completed 06/14/2018  While in ER: Noted to have worsening liver function  hypoglycemia Hyperkalemia   IV thiamin was administered followed by D50  The following Work up has been ordered so far:  Orders Placed This Encounter  Procedures  . DG Chest 2 View  . Comprehensive metabolic panel  . Lipase, blood  . CBC with Differential  . Urinalysis, Routine w reflex microscopic  . Consult to hospitalist     Following Medications were ordered in ER: Medications  fentaNYL (SUBLIMAZE) injection 50 mcg (50 mcg Intravenous Given 06/15/18 1756)  sodium chloride 0.9 % bolus 1,000 mL (1,000 mLs  Intravenous New Bag/Given 06/15/18 1726)    Significant initial  Findings: Abnormal Labs Reviewed  COMPREHENSIVE METABOLIC PANEL - Abnormal; Notable for the following components:      Result Value   Sodium 129 (*)    Potassium 5.6 (*)    Chloride 86 (*)    Glucose, Bld 68 (*)    BUN 32 (*)    Total Protein 8.2 (*)    Albumin 2.6 (*)    AST 674 (*)    ALT 191 (*)    Alkaline Phosphatase 768 (*)    Total Bilirubin 6.6 (*)    Anion gap 17 (*)    All other components within normal limits  CBC WITH DIFFERENTIAL/PLATELET - Abnormal; Notable for the following components:   WBC 16.0 (*)    Hemoglobin 10.6 (*)    HCT 34.5 (*)    MCH 25.1 (*)    RDW 17.5 (*)    nRBC 0.3 (*)    Neutro Abs 13.6 (*)    Lymphs Abs 0.4 (*)    Monocytes Absolute 1.8 (*)    Abs Immature Granulocytes 0.20 (*)    All other components within normal limits     Lactic Acid, Venous    Component Value Date/Time   LATICACIDVEN 4.3 (HH) 07/19/2015 1054    Na 129 K 5.6  LFT increased to AST 674 ALT 191 total bili 6.6 Lipase 35 Cr stable Lab Results  Component Value Date   CREATININE 0.44 06/15/2018  CREATININE 0.61 05/30/2018   CREATININE 0.64 05/29/2018      WBC  16  HG/HCT   stable,       Component Value Date/Time   HGB 10.6 (L) 06/15/2018 1707   HGB 11.4 (L) 05/13/2018 1356   HGB 11.7 01/05/2017 1023   HCT 34.5 (L) 06/15/2018 1707   HCT 35.7 01/05/2017 1023        UA  not ordered    CXR -  NON acute        ECG: HR 106 sinus tachy no ischemic changes QTC 469     ED Triage Vitals  Enc Vitals Group     BP 06/15/18 1651 97/74     Pulse Rate 06/15/18 1651 (!) 124     Resp 06/15/18 1651 13     Temp 06/15/18 1651 98 F (36.7 C)     Temp Source 06/15/18 1651 Oral     SpO2 06/15/18 1651 96 %     Weight 06/15/18 1649 115 lb (52.2 kg)     Height 06/15/18 1649 '5\' 3"'$  (1.6 m)     Head Circumference --      Peak Flow --      Pain Score 06/15/18 1649 6     Pain Loc --      Pain Edu?  --      Excl. in [place]? --   TMAX(24)@       Latest  Blood pressure 105/81, pulse (!) 108, temperature 98 F (36.7 C), temperature source Oral, resp. rate 15, height '5\' 3"'$  (1.6 m), weight 52.2 kg, SpO2 100 %.      Hospitalist was called for admission for uncontrolled pain related to cancer and worsening liver function   Review of Systems:    Pertinent positives include:  fatigue, weight loss    Constitutional:  No weight loss, night sweats, Fevers, chills abdominal pain,   HEENT:  No headaches, Difficulty swallowing,Tooth/dental problems,Sore throat,  No sneezing, itching, ear ache, nasal congestion, post nasal drip,  Cardio-vascular:  No chest pain, Orthopnea, PND, anasarca, dizziness, palpitations.no Bilateral lower extremity swelling  GI:  No heartburn, indigestion,, vomiting, diarrhea, change in bowel habits, loss of appetite, melena, blood in stool, hematemesis Resp:  no shortness of breath at rest. No dyspnea on exertion, No excess mucus, no productive cough, No non-productive cough, No coughing up of blood.No change in color of mucus.No wheezing. Skin:  no rash or lesions. No jaundice GU:  no dysuria, change in color of urine, no urgency or frequency. No straining to urinate.  No flank pain.  Musculoskeletal:  No joint pain or no joint swelling. No decreased range of motion. No back pain.  Psych:  No change in mood or affect. No depression or anxiety. No memory loss.  Neuro: no localizing neurological complaints, no tingling, no weakness, no double vision, no gait abnormality, no slurred speech, no confusion  All systems reviewed and apart from [place] all are negative  Past Medical History:   Past Medical History:  Diagnosis Date  . Breast cancer of upper-outer quadrant of left female breast ([place]) 07/11/2015  . DDD (degenerative disc disease), lumbar    L1-2  . Genetic testing 01/12/2017   Ms. Iannaccone underwent genetic counseling and testing for hereditary cancer  syndromes on 01/05/2017. Her results were negative for mutations in all 46 genes analyzed by [employer]'s 46-gene Common Hereditary Cancers Panel. Genes analyzed include: APC, ATM, AXIN2, BARD1, BMPR1A, BRCA1, BRCA2, BRIP1, CDH1, CDKN2A, CHEK2, CTNNA1, DICER1, EPCAM,  GREM1, HOXB13, KIT, MEN1, MLH1, MSH2, MSH3, MSH6, MUTYH, NBN,   . Genetic testing of female    [facility] panel negative 01/2017  . History of blood transfusion    "when I lost alot of blood when I went into premature labor"; no abnormal reaction noted      Past Surgical History:  Procedure Laterality Date  . BREAST BIOPSY Left 2018  . BREAST LUMPECTOMY WITH RADIOACTIVE SEED AND SENTINEL LYMPH NODE BIOPSY Left 10/28/2016   Procedure: LEFT BREAST LUMPECTOMY WITH BRACKETED RADIOACTIVE SEED AND SENTINEL LYMPH NODE BIOPSY;  Surgeon: [name], MD;  Location: [facility];  Service: General;  Laterality: Left;  . BREAST RECONSTRUCTION WITH PLACEMENT OF TISSUE EXPANDER AND FLEX HD (ACELLULAR HYDRATED DERMIS) Left 12/23/2016  . BREAST RECONSTRUCTION WITH PLACEMENT OF TISSUE EXPANDER AND FLEX HD (ACELLULAR HYDRATED DERMIS) Left 12/23/2016   Procedure: IMMEDIATE LEFT BREAST RECONSTRUCTION WITH PLACEMENT OF TISSUE EXPANDER AND FLEX HD (ACELLULAR HYDRATED DERMIS);  Surgeon: Wallace Going, DO;  Location: [facility];  Service: Plastics;  Laterality: Left;  . CESAREAN SECTION N/A 01/22/2013   Procedure: primary CESAREAN SECTION of baby boy at 2001;  Surgeon: Cheri Fowler, MD;  Location: [facility];  Service: Obstetrics;  Laterality: N/A;  . DILATION AND CURETTAGE OF UTERUS    . HERNIA REPAIR     UHR  . INDUCED ABORTION    . LAMINECTOMY N/A 04/28/2018   Procedure: THORACIC three corpectomy, excision of tumor, fusion and plating;  Surgeon: Newman Pies, MD;  Location: [facility];  Service: Neurosurgery;  Laterality: N/A;  . LAPAROSCOPIC CHOLECYSTECTOMY  Aug 2000  . LIPOSUCTION WITH LIPOFILLING Left 11/10/2017   Procedure: FAT GRAFTING TO LEFT UPPER POLL CHEST;   Surgeon: Wallace Going, DO;  Location: [facility];  Service: Plastics;  Laterality: Left;  Marland Kitchen MASTECTOMY Left 12/23/2016  . PARACENTESIS  07/17/2015   tube put in to drain fluid from near liver; US guided/notes 07/17/2015  . PLACEMENT OF BREAST IMPLANTS Right 11/10/2017   Procedure: PLACEMENT OF BREAST IMPLANT RIGHT;  Surgeon: Wallace Going, DO;  Location: [place] [facility];  Service: Plastics;  Laterality: Right;  . PORT-A-CATH REMOVAL Right 02/25/2017   Procedure: REMOVAL PORT-A-CATH;  Surgeon: Wallace Going, DO;  Location: [facility];  Service: Plastics;  Laterality: Right;  . PORTACATH PLACEMENT Right 07/24/2015   Procedure: INSERTION PORT-A-CATH RIGHT SUBCLAVIAN;  Surgeon: [name], MD;  Location: [facility];  Service: General;  Laterality: Right;  . RE-EXCISION OF BREAST LUMPECTOMY Left 11/09/2016   Procedure: RE-EXCISION OF BREAST LUMPECTOMY;  Surgeon: [name], MD;  Location: [place];  Service: General;  Laterality: Left;  . REMOVAL OF TISSUE EXPANDER AND PLACEMENT OF IMPLANT Left 02/25/2017   Procedure: REMOVAL OF LEFT BREAST  TISSUE EXPANDER AND PLACEMENT OF LEFT SILICONE IMPLANT;  Surgeon: Wallace Going, DO;  Location: [place];  Service: Plastics;  Laterality: Left;  . STERNOTOMY N/A 04/28/2018   Procedure: STERNOTOMY;  Surgeon: Prescott Gum, Collier Salina, MD;  Location: [facility];  Service: Open Heart Surgery;  Laterality: N/A;  . SUBXYPHOID PERICARDIAL WINDOW N/A 05/27/2018   Procedure: SUBXYPHOID PERICARDIAL WINDOW;  Surgeon: Ivin Poot, MD;  Location: [facility];  Service: Thoracic;  Laterality: N/A;  . TEE WITHOUT CARDIOVERSION N/A 05/27/2018   Procedure: TRANSESOPHAGEAL ECHOCARDIOGRAM (TEE);  Surgeon: Prescott Gum, Collier Salina, MD;  Location: [facility];  Service: Thoracic;  Laterality: N/A;  . TOTAL MASTECTOMY Left 12/23/2016   Procedure: LEFT MASTECTOMY;  Surgeon: [name], MD;  Location: [facility] [place];   Service: General;  Laterality: Left;  . UMBILICAL HERNIA REPAIR  ~ 1983  . WISDOM TOOTH EXTRACTION  2002    Social History:  Ambulatory  wheelchair bound,       reports that she has never smoked. She has never used smokeless tobacco. She reports that she drinks alcohol. She reports that she does not use drugs.     Family History:   Family History  Problem Relation Age of Onset  . Hypertension Mother   . Diabetes Maternal Grandmother     Allergies: No Known Allergies   Prior to Admission medications   Medication Sig Start Date End Date Taking? Authorizing Provider  ALPHA LIPOIC ACID PO Take 1 capsule by mouth daily.   Yes [provider]  docusate sodium (COLACE) 100 MG capsule Take 1 capsule (100 mg total) by mouth 2 (two) times daily. Patient taking differently: Take 100 mg by mouth 2 (two) times daily as needed for mild constipation.  04/30/18  Yes OstergardJoyice Faster, MD  lidocaine (XYLOCAINE) 2 % solution Use as directed 15 mLs in the mouth or throat every 4 (four) hours as needed (for esophagitis). 06/13/18  Yes Hayden Pedro, PA-C  Multiple Vitamin (MULTIVITAMIN) tablet Take 1 tablet by mouth daily.   Yes [provider]  oxyCODONE (OXY IR/ROXICODONE) 5 MG immediate release tablet Take 1 tablet (5 mg total) by mouth every 3 (three) hours as needed (pain). Patient taking differently: Take 1-2 mg by mouth every 3 (three) hours as needed for severe pain (pain).  06/08/18  Yes Tyler Pita, MD  sucralfate (CARAFATE) 1 g tablet Take 1 tablet (1 g total) by mouth 4 (four) times daily -  with meals and at bedtime. 5 min before meals for radiation induced esophagitis Patient taking differently: Take 0.5-1 g by mouth 3 (three) times daily as needed (indigestion). 5 min before meals for radiation induced esophagitis 06/08/18  Yes Tyler Pita, MD  TURMERIC PO Take 1 capsule by mouth daily.   Yes [provider]  lidocaine-prilocaine (EMLA)  cream Apply to affected area once Patient not taking: Reported on 06/15/2018 05/31/18   Nicholas Lose, MD  LORazepam (ATIVAN) 0.5 MG tablet Take 1 tablet (0.5 mg total) by mouth at bedtime as needed for sleep. Patient not taking: Reported on 06/15/2018 05/31/18   Nicholas Lose, MD  Magnesium 400 MG TABS Take 400 mg by mouth 2 (two) times daily. Patient not taking: Reported on 06/15/2018 05/31/18   Yaakov Guthrie, MD  metoprolol tartrate (LOPRESSOR) 25 MG tablet Take 1 tablet (25 mg total) by mouth 2 (two) times daily. Patient not taking: Reported on 06/15/2018 05/17/18   Nicholas Lose, MD  ondansetron (ZOFRAN) 8 MG tablet Take 1 tablet (8 mg total) by mouth 2 (two) times daily as needed (Nausea or vomiting). Patient not taking: Reported on 06/15/2018 05/31/18   Nicholas Lose, MD  prochlorperazine (COMPAZINE) 10 MG tablet Take 1 tablet (10 mg total) by mouth every 6 (six) hours as needed (Nausea or vomiting). Patient not taking: Reported on 06/15/2018 05/31/18   Nicholas Lose, MD   Physical Exam: Blood pressure 105/81, pulse (!) 108, temperature 98 F (36.7 C), temperature source Oral, resp. rate 15, height '5\' 3"'$  (1.6 m), weight 52.2 kg, SpO2 100 %. 1. General:  in No Acute distress   Chronically ill  cachectic  -appearing 2. Psychological: somnolent  and   Oriented to self and situation 3. Head/ENT:  Dry Mucous Membranes                          Head Non traumatic, neck supple                           Poor Dentition 4. SKIN:   decreased Skin turgor,  Skin clean Dry and intact no rash 5. Heart: rapid but Regular rate and rhythm no   Murmur, no Rub or gallop 6. Lungs:  Clear to auscultation bilaterally, no wheezes or crackles   7. Abdomen: Soft, severe tenderness diffuse,   distended     bowel sounds present 8. Lower extremities: no clubbing, cyanosis, or  1+ edema 9. Neurologically Grossly intact, moving all 4 extremities equally   10. MSK: Normal range of motion   LABS:     Recent Labs    Lab 06/15/18 1707  WBC 16.0*  NEUTROABS 13.6*  HGB 10.6*  HCT 34.5*  MCV 81.8  PLT 468   Basic Metabolic Panel: Recent Labs  Lab 06/15/18 1707  NA 129*  K 5.6*  CL 86*  CO2 26  GLUCOSE 68*  BUN 32*  CREATININE 0.44  CALCIUM 9.6     Recent Labs  Lab 06/15/18 1707  AST 674*  ALT 191*  ALKPHOS 768*  BILITOT 6.6*  PROT 8.2*  ALBUMIN 2.6*   Recent Labs  Lab 06/15/18 1707  LIPASE 35   No results for input(s): AMMONIA in the last 168 hours.    HbA1C: No results for input(s): HGBA1C in the last 72 hours. CBG: No results for input(s): GLUCAP in the last 168 hours.    Urine analysis:    Component Value Date/Time   COLORURINE AMBER (A) 05/26/2018 1354   APPEARANCEUR CLEAR 05/26/2018 1354   LABSPEC 1.020 05/26/2018 1354   PHURINE 6.0 05/26/2018 1354   GLUCOSEU NEGATIVE 05/26/2018 1354   HGBUR NEGATIVE 05/26/2018 1354   BILIRUBINUR NEGATIVE 05/26/2018 1354   KETONESUR NEGATIVE 05/26/2018 1354   PROTEINUR NEGATIVE 05/26/2018 1354   UROBILINOGEN 0.2 01/26/2013 0704   NITRITE NEGATIVE 05/26/2018 1354   LEUKOCYTESUR NEGATIVE 05/26/2018 1354     Cultures:    Component Value Date/Time   SDES URINE, CLEAN CATCH 07/17/2015 2009   [employer] NONE 07/17/2015 2009   CULT  07/17/2015 2009    NO GROWTH 1 DAY Performed at [facility] 07/19/2015 FINAL 07/17/2015 2009     Radiological Exams on Admission: Dg Chest 2 View  Result Date: 06/15/2018 CLINICAL DATA:  Weakness. EXAM: CHEST - 2 VIEW COMPARISON:  Multiple chest x-rays since May 23, 2018 FINDINGS: Haziness over the left base is consistent with overlapping soft tissues. The heart, hila, mediastinum, lungs, and pleura are otherwise unchanged with no acute interval change. IMPRESSION: No acute interval change. Electronically Signed   By: Dorise Bullion III M.D   On: 06/15/2018 18:41    Chart has been reviewed    Assessment/Plan  37 y.o. female with medical history significant  of metastatic  Breast CA , Thoracic tumor removal   Admitted foruncontrolled pain related to cancer and worsening liver function   Present on Admission:  . Abdominal pain likely related to underlying malignancy.\ Pain management.  Palliative care consult given elevated LFTs will obtain ultrasound to evaluate for any evidence of biliary obstruction that could be amendable to treatment discussed with oncology who will see in consult tomorrow. Given constipation  will obtain KUB to evaluate for stool burden as this can be also treatable cause of pain . Metastasis to liver [facility]) -most likely cause of elevated LFTs.  Evidence of progression worrisome for decompensation will check INR . Hyponatremia in the setting of severe liver disease and dehydration we will gently rehydrate and follow check urine electrolytes . Hyperkalemia -suspect possibility of cellular breakdown.  Will rehydrate obtain EKG showing no evidence of changes.  Monitor on telemetry repeat after rehydration if continues to stay elevated will need to treat.  If patient able to tolerate we will give a dose of p.o. Kayexalate . Transaminitis -in the setting of widespread liver metastases will evaluate for any evidence of biliary obstruction that could be reversible treatable . Ascites, malignant -chronic some abdominal distention obtain ultrasound to evaluate degree of ascites . Hypotension in the setting of dehydration and liver disease.  Will rehydrate and follow . Hypoglycemia symptomatic with acute encephalopathy which have improved after D50 administration in the setting of decreased p.o. disease intake and liver disease we will treat and continue to follow frequent CBG throughout the night hypoglycemia protocol . Elevated lactic acid level in the setting of decreased liver function at this point no evidence of infectious process leukocytosis is chronic patient is also chronically tachycardic no fevers Rehydrate and follow  Other plan  as per orders.  DVT prophylaxis:  SCD      Code Status:  FULL CODE  as per patient   I had personally discussed CODE STATUS with patient   Very poor prognosis patient is agreeable to palliative care consult  Family Communication:   Family not  at  Bedside    Disposition Plan:         To home once workup is complete and patient is stable                    Would benefit from PT/OT eval prior to DC  Ordered                                      Nutrition    consulted                                    Palliative care    consulted                       Consults called: oncology     Admission status:     Obs    Level of care       SDU tele indefinitely please discontinue once patient no longer qualifies      Adarsh Mundorf 06/15/2018, 8:56 PM    [facility]  Pager [phone]   after 2 AM please page floor coverage PA If 7AM-7PM, please contact the day team taking care of the patient  [email or web address]  Password [email or web address]

## 2018-06-15 NOTE — ED Notes (Signed)
Upon entering room to introduce myself to the patient, patient was found in her urine. She verbalized understanding of using the call bell for assistance with a bed pan for next time, still waiting on urine sample.

## 2018-06-15 NOTE — Telephone Encounter (Signed)
Phone call placed to patient to introduce Palliative Care and to schedule a visit with NP. Visit scheduled for 06/16/18 @ 11:30am

## 2018-06-15 NOTE — ED Notes (Signed)
Patient transported to X-ray 

## 2018-06-15 NOTE — ED Notes (Signed)
US at bedside

## 2018-06-15 NOTE — ED Triage Notes (Signed)
Patient c/o weakness x3 days. Denies N/V/D. Last radiation yesterday. Hx lung and liver cancer.

## 2018-06-15 NOTE — ED Provider Notes (Addendum)
[facility] DEPT Provider Note   CSN: [id number] Arrival date & time: 06/15/18  1645     History   Chief Complaint Chief Complaint  Patient presents with  . Weakness    HPI Dawn Haynes is a 37 y.o. female.  HPI  With a history of recurrent metastatic breast cancer with known disease in her liver, bones, ribs, spine presents with weakness, anorexia, nausea, and increasing pain. Pain is diffuse, severe, minimally improved with multiple doses of home narcotics. Pain is focally in the left lateral abdomen, about the area of known liver disease. No reported new fever, chest pain, syncope. Patient received her last radiation therapy session yesterday, scheduled to begin palliative chemotherapy next week. Patient is here with husband and children. Husband assists with the HPI.   Past Medical History:  Diagnosis Date  . Breast cancer of upper-outer quadrant of left female breast ([place]) 07/11/2015  . DDD (degenerative disc disease), lumbar    L1-2  . Genetic testing 01/12/2017   [name] underwent genetic counseling and testing for hereditary cancer syndromes on 01/05/2017. Her results were negative for mutations in all 46 genes analyzed by [employer]'s 46-gene Common Hereditary Cancers Panel. Genes analyzed include: APC, ATM, AXIN2, BARD1, BMPR1A, BRCA1, BRCA2, BRIP1, CDH1, CDKN2A, CHEK2, CTNNA1, DICER1, EPCAM, GREM1, HOXB13, KIT, MEN1, MLH1, MSH2, MSH3, MSH6, MUTYH, NBN,   . Genetic testing of female    [employer] panel negative 01/2017  . History of blood transfusion    "when I lost alot of blood when I went into premature labor"; no abnormal reaction noted    Patient Active Problem List   Diagnosis Date Noted  . Bone metastasis ([place]) 06/04/2018  . Goals of care, counseling/discussion 05/31/2018  . Chest pain   . Pericardial effusion 05/23/2018  . Hypokalemia 05/23/2018  . Pathologic fracture of thoracic vertebrae 04/28/2018  . Pathological fracture  of thoracic vertebra due to neoplastic disease 04/27/2018  . Genetic testing 01/12/2017  . Breast cancer ([place]) 12/24/2016  . Acquired absence of breast and absent nipple, left 12/23/2016  . Hyponatremia 07/29/2015  . Hyperkalemia 07/29/2015  . Transaminitis 07/29/2015  . Ascites, malignant   . Malignant ascites   . Port catheter in place   . Metastasis to liver ([place]) 07/24/2015  . Ascites 07/17/2015  . SBP (spontaneous bacterial peritonitis) ([place]) 07/17/2015  . Abdominal pain 07/17/2015  . SOB (shortness of breath) 07/17/2015  . DDD (degenerative disc disease), lumbar 07/17/2015  . Severe sepsis ([place])   . Breast cancer of upper-outer quadrant of left female breast ([place]) 07/11/2015    Past Surgical History:  Procedure Laterality Date  . BREAST BIOPSY Left 2018  . BREAST LUMPECTOMY WITH RADIOACTIVE SEED AND SENTINEL LYMPH NODE BIOPSY Left 10/28/2016   Procedure: LEFT BREAST LUMPECTOMY WITH BRACKETED RADIOACTIVE SEED AND SENTINEL LYMPH NODE BIOPSY;  Surgeon: [name], MD;  Location: [facility];  Service: General;  Laterality: Left;  . BREAST RECONSTRUCTION WITH PLACEMENT OF TISSUE EXPANDER AND FLEX HD (ACELLULAR HYDRATED DERMIS) Left 12/23/2016  . BREAST RECONSTRUCTION WITH PLACEMENT OF TISSUE EXPANDER AND FLEX HD (ACELLULAR HYDRATED DERMIS) Left 12/23/2016   Procedure: IMMEDIATE LEFT BREAST RECONSTRUCTION WITH PLACEMENT OF TISSUE EXPANDER AND FLEX HD (ACELLULAR HYDRATED DERMIS);  Surgeon: Wallace Going, DO;  Location: [facility];  Service: Plastics;  Laterality: Left;  . CESAREAN SECTION N/A 01/22/2013   Procedure: primary CESAREAN SECTION of baby boy at 2001;  Surgeon: Cheri Fowler, MD;  Location: [facility];  Service: Obstetrics;  Laterality:  N/A;  . DILATION AND CURETTAGE OF UTERUS    . HERNIA REPAIR     UHR  . INDUCED ABORTION    . LAMINECTOMY N/A 04/28/2018   Procedure: THORACIC three corpectomy, excision of tumor, fusion and plating;  Surgeon: Newman Pies, MD;  Location: [facility];  Service: Neurosurgery;  Laterality: N/A;  . LAPAROSCOPIC CHOLECYSTECTOMY  Aug 2000  . LIPOSUCTION WITH LIPOFILLING Left 11/10/2017   Procedure: FAT GRAFTING TO LEFT UPPER POLL CHEST;  Surgeon: Wallace Going, DO;  Location: [facility];  Service: Plastics;  Laterality: Left;  Marland Kitchen MASTECTOMY Left 12/23/2016  . PARACENTESIS  07/17/2015   tube put in to drain fluid from near liver; US guided/notes 07/17/2015  . PLACEMENT OF BREAST IMPLANTS Right 11/10/2017   Procedure: PLACEMENT OF BREAST IMPLANT RIGHT;  Surgeon: Wallace Going, DO;  Location: [facility];  Service: Plastics;  Laterality: Right;  . PORT-A-CATH REMOVAL Right 02/25/2017   Procedure: REMOVAL PORT-A-CATH;  Surgeon: Wallace Going, DO;  Location: [facility];  Service: Plastics;  Laterality: Right;  . PORTACATH PLACEMENT Right 07/24/2015   Procedure: INSERTION PORT-A-CATH RIGHT SUBCLAVIAN;  Surgeon: [name], MD;  Location: [facility];  Service: General;  Laterality: Right;  . RE-EXCISION OF BREAST LUMPECTOMY Left 11/09/2016   Procedure: RE-EXCISION OF BREAST LUMPECTOMY;  Surgeon: [name], MD;  Location: [facility];  Service: General;  Laterality: Left;  . REMOVAL OF TISSUE EXPANDER AND PLACEMENT OF IMPLANT Left 02/25/2017   Procedure: REMOVAL OF LEFT BREAST  TISSUE EXPANDER AND PLACEMENT OF LEFT SILICONE IMPLANT;  Surgeon: Wallace Going, DO;  Location: [facility];  Service: Plastics;  Laterality: Left;  . STERNOTOMY N/A 04/28/2018   Procedure: STERNOTOMY;  Surgeon: Prescott Gum, Collier Salina, MD;  Location: [facility];  Service: Open Heart Surgery;  Laterality: N/A;  . SUBXYPHOID PERICARDIAL WINDOW N/A 05/27/2018   Procedure: SUBXYPHOID PERICARDIAL WINDOW;  Surgeon: Ivin Poot, MD;  Location: [facility];  Service: Thoracic;  Laterality: N/A;  . TEE WITHOUT CARDIOVERSION N/A 05/27/2018   Procedure: TRANSESOPHAGEAL ECHOCARDIOGRAM (TEE);  Surgeon: Prescott Gum, Collier Salina, MD;  Location: [facility];  Service: Thoracic;  Laterality: N/A;  . TOTAL MASTECTOMY Left 12/23/2016   Procedure: LEFT MASTECTOMY;  Surgeon: [name], MD;  Location: [facility];  Service: General;  Laterality: Left;  . UMBILICAL HERNIA REPAIR  ~ 1983  . WISDOM TOOTH EXTRACTION  2002     OB History    Gravida  15   Para  7   Term  4   Preterm  3   AB  7   Living  6     SAB  6   TAB  1   Ectopic      Multiple      Live Births  7            Home Medications    Prior to Admission medications   Medication Sig Start Date End Date Taking? Authorizing Provider  ALPHA LIPOIC ACID PO Take 1 capsule by mouth daily.    [provider]  docusate sodium (COLACE) 100 MG capsule Take 1 capsule (100 mg total) by mouth 2 (two) times daily. 04/30/18   Judith Part, MD  lidocaine (XYLOCAINE) 2 % solution Use as directed 15 mLs in the mouth or throat every 4 (four) hours as needed (for esophagitis). 06/13/18   Hayden Pedro, PA-C  lidocaine-prilocaine (EMLA) cream Apply to affected area  once 05/31/18   Nicholas Lose, MD  LORazepam (ATIVAN) 0.5 MG tablet Take 1 tablet (0.5 mg total) by mouth at bedtime as needed for sleep. 05/31/18   Nicholas Lose, MD  Magnesium 400 MG TABS Take 400 mg by mouth 2 (two) times daily. 05/31/18   Matcha, Beverely Pace, MD  metoprolol tartrate (LOPRESSOR) 25 MG tablet Take 1 tablet (25 mg total) by mouth 2 (two) times daily. 05/17/18   Nicholas Lose, MD  Multiple Vitamin (MULTIVITAMIN) tablet Take 1 tablet by mouth daily.    [provider]  ondansetron (ZOFRAN) 8 MG tablet Take 1 tablet (8 mg total) by mouth 2 (two) times daily as needed (Nausea or vomiting). 05/31/18   Nicholas Lose, MD  oxyCODONE (OXY IR/ROXICODONE) 5 MG immediate release tablet Take 1 tablet (5 mg total) by mouth every 3 (three) hours as needed (pain). 06/08/18   Tyler Pita, MD  prochlorperazine (COMPAZINE) 10 MG tablet Take 1 tablet (10 mg total)  by mouth every 6 (six) hours as needed (Nausea or vomiting). 05/31/18   Nicholas Lose, MD  sucralfate (CARAFATE) 1 g tablet Take 1 tablet (1 g total) by mouth 4 (four) times daily -  with meals and at bedtime. 5 min before meals for radiation induced esophagitis 06/08/18   Tyler Pita, MD  TURMERIC PO Take 1 capsule by mouth daily.    [provider]    Family History Family History  Problem Relation Age of Onset  . Hypertension Mother   . Diabetes Maternal Grandmother     Social History Social History   Tobacco Use  . Smoking status: Never Smoker  . Smokeless tobacco: Never Used  Substance Use Topics  . Alcohol use: Yes    Comment: 12/23/2016 "might have a drink a few times/year"  . Drug use: No     Allergies   Patient has no known allergies.   Review of Systems Review of Systems  Constitutional:       Per HPI, otherwise negative  HENT:       Per HPI, otherwise negative  Respiratory:       Per HPI, otherwise negative  Cardiovascular:       Per HPI, otherwise negative  Gastrointestinal: Positive for abdominal pain and nausea. Negative for vomiting.  Endocrine:       Negative aside from HPI  Genitourinary:       Neg aside from HPI   Musculoskeletal:       Per HPI, otherwise negative  Skin: Negative.   Allergic/Immunologic: Positive for immunocompromised state.  Neurological: Positive for dizziness, weakness and light-headedness. Negative for syncope.     Physical Exam Updated Vital Signs BP 97/74 (BP Location: Right Arm)   Pulse (!) 124   Temp 98 F (36.7 C) (Oral)   Resp 13   Ht _0  (1.6 m)   Wt 52.2 kg   SpO2 96%   BMI 20.37 kg/m   Physical Exam  Constitutional: She is oriented to person, place, and time. She has a sickly appearance. She appears ill.  HENT:  Head: Normocephalic and atraumatic.  Mouth/Throat: Mucous membranes are dry.  Eyes: Conjunctivae and EOM are normal.  Cardiovascular: Regular rhythm. Tachycardia present.    Pulmonary/Chest: Effort normal and breath sounds normal. No stridor. No respiratory distress.  Abdominal: She exhibits no distension.    Musculoskeletal: She exhibits no edema.  Neurological: She is alert and oriented to person, place, and time. No cranial nerve deficit.  Skin: Skin is warm and  dry.  Psychiatric: She is withdrawn.  Nursing note and vitals reviewed.    ED Treatments / Results  Labs (all labs ordered are listed, but only abnormal results are displayed) Labs Reviewed  COMPREHENSIVE METABOLIC PANEL - Abnormal; Notable for the following components:      Result Value   Sodium 129 (*)    Potassium 5.6 (*)    Chloride 86 (*)    Glucose, Bld 68 (*)    BUN 32 (*)    Total Protein 8.2 (*)    Albumin 2.6 (*)    AST 674 (*)    ALT 191 (*)    Alkaline Phosphatase 768 (*)    Total Bilirubin 6.6 (*)    Anion gap 17 (*)    All other components within normal limits  CBC WITH DIFFERENTIAL/PLATELET - Abnormal; Notable for the following components:   WBC 16.0 (*)    Hemoglobin 10.6 (*)    HCT 34.5 (*)    MCH 25.1 (*)    RDW 17.5 (*)    nRBC 0.3 (*)    Neutro Abs 13.6 (*)    Lymphs Abs 0.4 (*)    Monocytes Absolute 1.8 (*)    Abs Immature Granulocytes 0.20 (*)    All other components within normal limits  URINALYSIS, ROUTINE W REFLEX MICROSCOPIC - Abnormal; Notable for the following components:   Color, Urine AMBER (*)    Glucose, UA 50 (*)    Hgb urine dipstick MODERATE (*)    All other components within normal limits  PROTIME-INR - Abnormal; Notable for the following components:   Prothrombin Time 19.6 (*)    All other components within normal limits  BASIC METABOLIC PANEL - Abnormal; Notable for the following components:   Sodium 131 (*)    Chloride 92 (*)    BUN 26 (*)    Calcium 8.4 (*)    Anion gap 16 (*)    All other components within normal limits  MAGNESIUM - Abnormal; Notable for the following components:   Magnesium 2.8 (*)    All other components  within normal limits  LACTATE DEHYDROGENASE - Abnormal; Notable for the following components:   LDH 1,812 (*)    All other components within normal limits  I-STAT BETA HCG BLOOD, ED ([facility], [facility], [facility] ONLY) - Abnormal; Notable for the following components:   I-stat hCG, quantitative 6.5 (*)    All other components within normal limits  CBG MONITORING, ED - Abnormal; Notable for the following components:   Glucose-Capillary 48 (*)    All other components within normal limits  LIPASE, BLOOD  CREATININE, URINE, RANDOM  SODIUM, URINE, RANDOM  AMMONIA  PHOSPHORUS  URIC ACID  OSMOLALITY, URINE  BLOOD GAS, VENOUS  PREGNANCY, URINE  CBG MONITORING, ED   Radiology Dg Chest 2 View  Result Date: 06/15/2018 CLINICAL DATA:  Weakness. EXAM: CHEST - 2 VIEW COMPARISON:  Multiple chest x-rays since May 23, 2018 FINDINGS: Haziness over the left base is consistent with overlapping soft tissues. The heart, hila, mediastinum, lungs, and pleura are otherwise unchanged with no acute interval change. IMPRESSION: No acute interval change. Electronically Signed   By: Dorise Bullion III M.D   On: 06/15/2018 18:41       Procedures Procedures (including critical care time)  Medications Ordered in ED Medications  sodium chloride 0.9 % bolus 1,000 mL (has no administration in time range)     Initial Impression / Assessment and Plan / ED Course  I have  reviewed the triage vital signs and the nursing notes.  Pertinent labs & imaging results that were available during my care of the patient were reviewed by me and considered in my medical decision making (see chart for details).     After the initial evaluation I reviewed the patient's chart. Notable for recurrent breast cancer, with known metastatic disease. Per oncology notes, with summary below, patient completed palliative radiation therapy yesterday, is scheduled to start palliative chemotherapy next week.  Metastatic triple negative breast  cancer 1.  Cord compression status post surgery: She will need palliative radiation 2. pericardial effusion: Status post pericardial window placement 05/26/2018 3. Palliative radiation therapy completed 06/14/2018  Update: On repeat exam the patient remains ill in appearance, but awake, alert. Discussed all findings including recent studies, demonstrating progression of her disease, and we discussed her current goals of therapy according to her oncology team, which are palliative chemotherapy, following recent palliative radiation therapy. Patient continues to designate herself as a full code.  Remaining labs notable for worsening hepatic failure, with hyperbilirubinemia substantially greater than 2 weeks ago, liver enzymes also substantially worse. Patient has remained calm, awake and alert, with mild tachycardia. Given her worsening clinical condition, including evidence for hepatic dysfunction, tachycardia, pain, nausea, lack of substantial oral intake, with consideration of dehydration, patient will be admitted for further evaluation and management.   Final Clinical Impressions(s) / ED Diagnoses  Dehydration Liver dysfunction hypotension   Carmin Muskrat, MD 06/15/18 2349    Carmin Muskrat, MD 06/15/18 2350

## 2018-06-15 NOTE — ED Notes (Signed)
Bed: WA21 Expected date:  Expected time:  Means of arrival:  Comments: [place]

## 2018-06-16 ENCOUNTER — Other Ambulatory Visit: Payer: Self-pay | Admitting: Primary Care

## 2018-06-16 ENCOUNTER — Other Ambulatory Visit: Payer: Self-pay

## 2018-06-16 DIAGNOSIS — C787 Secondary malignant neoplasm of liver and intrahepatic bile duct: Secondary | ICD-10-CM | POA: Diagnosis present

## 2018-06-16 DIAGNOSIS — R17 Unspecified jaundice: Secondary | ICD-10-CM

## 2018-06-16 DIAGNOSIS — C50412 Malignant neoplasm of upper-outer quadrant of left female breast: Secondary | ICD-10-CM

## 2018-06-16 DIAGNOSIS — K7200 Acute and subacute hepatic failure without coma: Secondary | ICD-10-CM | POA: Diagnosis present

## 2018-06-16 DIAGNOSIS — B3781 Candidal esophagitis: Secondary | ICD-10-CM | POA: Diagnosis present

## 2018-06-16 DIAGNOSIS — C7800 Secondary malignant neoplasm of unspecified lung: Secondary | ICD-10-CM

## 2018-06-16 DIAGNOSIS — C7951 Secondary malignant neoplasm of bone: Secondary | ICD-10-CM

## 2018-06-16 DIAGNOSIS — E162 Hypoglycemia, unspecified: Secondary | ICD-10-CM | POA: Diagnosis present

## 2018-06-16 DIAGNOSIS — I313 Pericardial effusion (noninflammatory): Secondary | ICD-10-CM | POA: Diagnosis present

## 2018-06-16 DIAGNOSIS — G9529 Other cord compression: Secondary | ICD-10-CM | POA: Diagnosis present

## 2018-06-16 DIAGNOSIS — I10 Essential (primary) hypertension: Secondary | ICD-10-CM | POA: Diagnosis present

## 2018-06-16 DIAGNOSIS — E871 Hypo-osmolality and hyponatremia: Secondary | ICD-10-CM | POA: Diagnosis present

## 2018-06-16 DIAGNOSIS — E872 Acidosis: Secondary | ICD-10-CM | POA: Diagnosis present

## 2018-06-16 DIAGNOSIS — R945 Abnormal results of liver function studies: Secondary | ICD-10-CM | POA: Diagnosis not present

## 2018-06-16 DIAGNOSIS — I9589 Other hypotension: Secondary | ICD-10-CM | POA: Diagnosis present

## 2018-06-16 DIAGNOSIS — E440 Moderate protein-calorie malnutrition: Secondary | ICD-10-CM

## 2018-06-16 DIAGNOSIS — E860 Dehydration: Secondary | ICD-10-CM | POA: Diagnosis present

## 2018-06-16 DIAGNOSIS — E43 Unspecified severe protein-calorie malnutrition: Secondary | ICD-10-CM

## 2018-06-16 DIAGNOSIS — E875 Hyperkalemia: Secondary | ICD-10-CM | POA: Diagnosis present

## 2018-06-16 DIAGNOSIS — R531 Weakness: Secondary | ICD-10-CM | POA: Diagnosis not present

## 2018-06-16 DIAGNOSIS — Z6821 Body mass index (BMI) 21.0-21.9, adult: Secondary | ICD-10-CM

## 2018-06-16 DIAGNOSIS — Z66 Do not resuscitate: Secondary | ICD-10-CM | POA: Diagnosis not present

## 2018-06-16 DIAGNOSIS — R1084 Generalized abdominal pain: Secondary | ICD-10-CM | POA: Diagnosis not present

## 2018-06-16 DIAGNOSIS — K7689 Other specified diseases of liver: Secondary | ICD-10-CM | POA: Diagnosis not present

## 2018-06-16 DIAGNOSIS — M5136 Other intervertebral disc degeneration, lumbar region: Secondary | ICD-10-CM | POA: Diagnosis present

## 2018-06-16 DIAGNOSIS — R160 Hepatomegaly, not elsewhere classified: Secondary | ICD-10-CM | POA: Diagnosis present

## 2018-06-16 DIAGNOSIS — Z515 Encounter for palliative care: Secondary | ICD-10-CM | POA: Diagnosis not present

## 2018-06-16 DIAGNOSIS — C50919 Malignant neoplasm of unspecified site of unspecified female breast: Secondary | ICD-10-CM | POA: Diagnosis not present

## 2018-06-16 DIAGNOSIS — G893 Neoplasm related pain (acute) (chronic): Secondary | ICD-10-CM | POA: Diagnosis not present

## 2018-06-16 DIAGNOSIS — R180 Malignant ascites: Secondary | ICD-10-CM | POA: Diagnosis present

## 2018-06-16 DIAGNOSIS — K5900 Constipation, unspecified: Secondary | ICD-10-CM | POA: Diagnosis not present

## 2018-06-16 DIAGNOSIS — R64 Cachexia: Secondary | ICD-10-CM | POA: Diagnosis present

## 2018-06-16 LAB — PREGNANCY, URINE: Preg Test, Ur: NEGATIVE

## 2018-06-16 LAB — COMPREHENSIVE METABOLIC PANEL
ALT: 162 U/L — AB (ref 0–44)
AST: 585 U/L — ABNORMAL HIGH (ref 15–41)
Albumin: 2.1 g/dL — ABNORMAL LOW (ref 3.5–5.0)
Alkaline Phosphatase: 684 U/L — ABNORMAL HIGH (ref 38–126)
Anion gap: 16 — ABNORMAL HIGH (ref 5–15)
BUN: 22 mg/dL — ABNORMAL HIGH (ref 6–20)
CO2: 24 mmol/L (ref 22–32)
CREATININE: 0.43 mg/dL — AB (ref 0.44–1.00)
Calcium: 8.6 mg/dL — ABNORMAL LOW (ref 8.9–10.3)
Chloride: 92 mmol/L — ABNORMAL LOW (ref 98–111)
GFR calc Af Amer: >60 mL/min (ref >60)
GFR calc non Af Amer: >60 mL/min (ref >60)
Glucose, Bld: 84 mg/dL (ref 70–99)
Potassium: 4.9 mmol/L (ref 3.5–5.1)
Sodium: 132 mmol/L — ABNORMAL LOW (ref 135–145)
Total Bilirubin: 6.3 mg/dL — ABNORMAL HIGH (ref 0.3–1.2)
Total Protein: 7.2 g/dL (ref 6.5–8.1)

## 2018-06-16 LAB — GLUCOSE, CAPILLARY
Glucose-Capillary: 96 mg/dL (ref 70–99)
Glucose-Capillary: 81 mg/dL (ref 70–99)
Glucose-Capillary: 103 mg/dL — ABNORMAL HIGH (ref 70–99)
Glucose-Capillary: 81 mg/dL (ref 70–99)
Glucose-Capillary: 77 mg/dL (ref 70–99)
Glucose-Capillary: 76 mg/dL (ref 70–99)
Glucose-Capillary: 89 mg/dL (ref 70–99)

## 2018-06-16 LAB — MRSA PCR SCREENING: MRSA by PCR: NEGATIVE

## 2018-06-16 LAB — CBC
HCT: 30.8 % — ABNORMAL LOW (ref 36.0–46.0)
Hemoglobin: 9.4 g/dL — ABNORMAL LOW (ref 12.0–15.0)
MCH: 25.3 pg — ABNORMAL LOW (ref 26.0–34.0)
MCHC: 30.5 g/dL (ref 30.0–36.0)
MCV: 82.8 fL (ref 80.0–100.0)
Platelets: 181 10*3/uL (ref 150–400)
RBC: 3.72 MIL/uL — ABNORMAL LOW (ref 3.87–5.11)
RDW: 17.8 % — AB (ref 11.5–15.5)
WBC: 15.1 10*3/uL — ABNORMAL HIGH (ref 4.0–10.5)
nRBC: 0.1 % (ref 0.0–0.2)

## 2018-06-16 LAB — BLOOD GAS, VENOUS
Acid-Base Excess: 2.1 mmol/L — ABNORMAL HIGH (ref 0.0–2.0)
BICARBONATE: 26.7 mmol/L (ref 20.0–28.0)
O2 Saturation: 24.5 %
PH VEN: 7.392 (ref 7.250–7.430)
Patient temperature: 98.6
pCO2, Ven: 44.9 mmHg (ref 44.0–60.0)

## 2018-06-16 LAB — MAGNESIUM: Magnesium: 2.7 mg/dL — ABNORMAL HIGH (ref 1.7–2.4)

## 2018-06-16 LAB — OSMOLALITY, URINE: Osmolality, Ur: 608 mOsm/kg (ref 300–900)

## 2018-06-16 LAB — PROCALCITONIN: Procalcitonin: 15.5 ng/mL

## 2018-06-16 LAB — CBG MONITORING, ED
Glucose-Capillary: 92 mg/dL (ref 70–99)
Glucose-Capillary: 77 mg/dL (ref 70–99)
Glucose-Capillary: 61 mg/dL — ABNORMAL LOW (ref 70–99)

## 2018-06-16 LAB — PHOSPHORUS: Phosphorus: 2.9 mg/dL (ref 2.5–4.6)

## 2018-06-16 LAB — TSH: TSH: 1.844 u[IU]/mL (ref 0.350–4.500)

## 2018-06-16 LAB — LACTIC ACID, PLASMA
Lactic Acid, Venous: 6.2 mmol/L (ref 0.5–1.9)
Lactic Acid, Venous: 5.4 mmol/L (ref 0.5–1.9)
Lactic Acid, Venous: 5.7 mmol/L (ref 0.5–1.9)

## 2018-06-16 LAB — APTT: aPTT: 39 seconds — ABNORMAL HIGH (ref 24–36)

## 2018-06-16 MED ORDER — SODIUM CHLORIDE 0.9 % IV SOLN
INTRAVENOUS | Status: AC
  Administered 2018-06-16: 02:00:00 via INTRAVENOUS

## 2018-06-16 MED ORDER — ACETAMINOPHEN 650 MG RE SUPP: 650.0000 mg | Freq: Four times a day (QID) | RECTAL | Status: DC | PRN

## 2018-06-16 MED ORDER — DEXTROSE 50 % IV SOLN
1.0000 | Freq: Once | INTRAVENOUS | Status: AC
  Administered 2018-06-16: 50 mL via INTRAVENOUS
  Filled 2018-06-16: qty 50

## 2018-06-16 MED ORDER — ONDANSETRON HCL 4 MG PO TABS: 4.0000 mg | ORAL_TABLET | Freq: Four times a day (QID) | ORAL | Status: DC | PRN

## 2018-06-16 MED ORDER — OXYCODONE HCL 5 MG PO TABS: 1.0000 mg | ORAL_TABLET | ORAL | Status: DC | PRN

## 2018-06-16 MED ORDER — ONDANSETRON HCL 4 MG/2ML IJ SOLN: 4.0000 mg | Freq: Four times a day (QID) | INTRAMUSCULAR | Status: DC | PRN

## 2018-06-16 MED ORDER — POLYETHYLENE GLYCOL 3350 17 G PO PACK
17.0000 g | PACK | Freq: Every day | ORAL | Status: DC
  Administered 2018-06-16 – 2018-06-18 (×2): 17 g via ORAL
  Filled 2018-06-16 (×3): qty 1

## 2018-06-16 MED ORDER — OXYCODONE HCL 5 MG PO TABS: 5.0000 mg | ORAL_TABLET | ORAL | Status: DC | PRN

## 2018-06-16 MED ORDER — VANCOMYCIN HCL IN DEXTROSE 1-5 GM/200ML-% IV SOLN
1000.0000 mg | Freq: Once | INTRAVENOUS | Status: AC
  Administered 2018-06-16: 1000 mg via INTRAVENOUS
  Filled 2018-06-16: qty 200

## 2018-06-16 MED ORDER — THIAMINE HCL 100 MG/ML IJ SOLN
100.0000 mg | Freq: Every day | INTRAMUSCULAR | Status: DC
  Administered 2018-06-16 – 2018-06-19 (×4): 100 mg via INTRAVENOUS
  Filled 2018-06-16 (×5): qty 2

## 2018-06-16 MED ORDER — SUCRALFATE 1 G PO TABS: 0.5000 g | ORAL_TABLET | Freq: Three times a day (TID) | ORAL | Status: DC | PRN

## 2018-06-16 MED ORDER — DOCUSATE SODIUM 100 MG PO CAPS: 100.0000 mg | ORAL_CAPSULE | Freq: Two times a day (BID) | ORAL | Status: DC | PRN

## 2018-06-16 MED ORDER — ADULT MULTIVITAMIN W/MINERALS CH
1.0000 | ORAL_TABLET | Freq: Every day | ORAL | Status: DC
  Filled 2018-06-16: qty 1

## 2018-06-16 MED ORDER — SUCRALFATE 1 G PO TABS
0.5000 g | ORAL_TABLET | Freq: Three times a day (TID) | ORAL | Status: DC
  Administered 2018-06-16: 0.5 g via ORAL
  Administered 2018-06-17: 1 g via ORAL
  Filled 2018-06-16 (×3): qty 1

## 2018-06-16 MED ORDER — PHENOL 1.4 % MT LIQD
1.0000 | OROMUCOSAL | Status: DC | PRN
  Filled 2018-06-16: qty 177

## 2018-06-16 MED ORDER — FLUCONAZOLE 100MG IVPB
100.0000 mg | INTRAVENOUS | Status: DC
  Administered 2018-06-16 – 2018-06-19 (×4): 100 mg via INTRAVENOUS
  Filled 2018-06-16 (×4): qty 50

## 2018-06-16 MED ORDER — SENNOSIDES-DOCUSATE SODIUM 8.6-50 MG PO TABS
1.0000 | ORAL_TABLET | Freq: Two times a day (BID) | ORAL | Status: DC
  Administered 2018-06-17 – 2018-06-19 (×3): 1 via ORAL
  Filled 2018-06-16 (×6): qty 1

## 2018-06-16 MED ORDER — SODIUM CHLORIDE 0.9 % IV SOLN
2.0000 g | INTRAVENOUS | Status: DC
  Administered 2018-06-16 – 2018-06-19 (×4): 2 g via INTRAVENOUS
  Filled 2018-06-16 (×4): qty 2

## 2018-06-16 MED ORDER — ACETAMINOPHEN 325 MG PO TABS: 650.0000 mg | ORAL_TABLET | Freq: Four times a day (QID) | ORAL | Status: DC | PRN

## 2018-06-16 MED ORDER — SODIUM CHLORIDE 0.9 % IV BOLUS
500.0000 mL | Freq: Once | INTRAVENOUS | Status: AC
  Administered 2018-06-16: 500 mL via INTRAVENOUS

## 2018-06-16 MED ORDER — DEXTROSE 5 % IV SOLN
INTRAVENOUS | Status: DC
  Administered 2018-06-16 – 2018-06-17 (×3): via INTRAVENOUS

## 2018-06-16 MED ORDER — SODIUM CHLORIDE 0.9 % IV SOLN
INTRAVENOUS | Status: DC | PRN
  Administered 2018-06-16: 1000 mL via INTRAVENOUS

## 2018-06-16 NOTE — ED Notes (Signed)
ED TO INPATIENT HANDOFF REPORT  Name/Age/Gender Dawn Haynes 37 y.o. female  Code Status    Code Status Orders  (From admission, onward)         Start     Ordered   06/16/18 0122  Full code  Continuous     06/16/18 0122        Code Status History    Date Active Date Inactive Code Status Order ID Comments User Context   05/27/2018 1117 05/31/2018 1408 Full Code [id number]  Ivin Poot, MD Inpatient   05/24/2018 0142 05/27/2018 1117 Full Code [id number]  Toy Baker, MD Inpatient   04/27/2018 2218 04/30/2018 2108 Full Code [id number]  Newman Pies, MD ED   12/23/2016 1130 12/23/2016 1250 Full Code [id number]  Wallace Going, DO Inpatient   07/24/2015 1719 07/29/2015 2350 Full Code [id number]  [name], MD Inpatient   07/18/2015 0401 07/24/2015 1719 Full Code [id number]  Theressa Millard, MD Inpatient      Home/SNF/Other Home  Chief Complaint weakness   Level of Care/Admitting Diagnosis ED Disposition    ED Disposition Condition [facility]: [facility] [100102]  Level of Care: Stepdown [14]  Admit to SDU based on following criteria: Hemodynamic compromise or significant risk of instability:  Patient requiring short term acute titration and management of vasoactive drips, and invasive monitoring (i.e., CVP and Arterial line).  Diagnosis: Hypotension [248250]  Admitting Physician: Toy Baker [[id number]]  Attending Physician: Toy Baker [[id number]]  PT Class (Do Not Modify): Observation [104]  PT Acc Code (Do Not Modify): Observation [10022]       Medical History Past Medical History:  Diagnosis Date  . Breast cancer of upper-outer quadrant of left female breast ([place]) 07/11/2015  . DDD (degenerative disc disease), lumbar    L1-2  . Genetic testing 01/12/2017   Dawn Haynes underwent genetic counseling and testing for hereditary cancer syndromes on 01/05/2017. Her results were negative for mutations in  all 46 genes analyzed by [facility]'s 46-gene Common Hereditary Cancers Panel. Genes analyzed include: APC, ATM, AXIN2, BARD1, BMPR1A, BRCA1, BRCA2, BRIP1, CDH1, CDKN2A, CHEK2, CTNNA1, DICER1, EPCAM, GREM1, HOXB13, KIT, MEN1, MLH1, MSH2, MSH3, MSH6, MUTYH, NBN,   . Genetic testing of female    [facility] panel negative 01/2017  . History of blood transfusion    "when I lost alot of blood when I went into premature labor"; no abnormal reaction noted    Allergies No Known Allergies  IV Location/Drains/Wounds Patient Lines/Drains/Airways Status   Active Line/Drains/Airways    Name:   Placement date:   Placement time:   Site:   Days:   Peripheral IV 06/15/18 Right Antecubital   06/15/18    1720    Antecubital   1   Peripheral IV 06/15/18 Right;Upper Arm   06/15/18    2230    Arm   1   Incision (Closed) 05/27/18 Chest Other (Comment)   05/27/18    0824     20   Incision - 1 Port   07/26/15    -     1056          Labs/Imaging Results for orders placed or performed during the hospital encounter of 06/15/18 (from the past 48 hour(s))  Comprehensive metabolic panel     Status: Abnormal   Collection Time: 06/15/18  5:07 PM  Result Value Ref Range   Sodium 129 (L) 135 - 145 mmol/L   Potassium 5.6 (H) 3.5 -  5.1 mmol/L   Chloride 86 (L) 98 - 111 mmol/L   CO2 26 22 - 32 mmol/L   Glucose, Bld 68 (L) 70 - 99 mg/dL   BUN 32 (H) 6 - 20 mg/dL   Creatinine, Ser 0.44 0.44 - 1.00 mg/dL   Calcium 9.6 8.9 - 10.3 mg/dL   Total Protein 8.2 (H) 6.5 - 8.1 g/dL   Albumin 2.6 (L) 3.5 - 5.0 g/dL   AST 674 (H) 15 - 41 U/L   ALT 191 (H) 0 - 44 U/L   Alkaline Phosphatase 768 (H) 38 - 126 U/L   Total Bilirubin 6.6 (H) 0.3 - 1.2 mg/dL   GFR calc non Af Amer >60 >60 mL/min   GFR calc Af Amer >60 >60 mL/min   Anion gap 17 (H) 5 - 15    Comment: Performed at [facility], [place] [address], [place], [place] [address]  Lipase, blood     Status: None   Collection Time: 06/15/18  5:07 PM  Result Value  Ref Range   Lipase 35 11 - 51 U/L    Comment: Performed at [facility], [place] [address], [place], [place] [address]  CBC with Differential     Status: Abnormal   Collection Time: 06/15/18  5:07 PM  Result Value Ref Range   WBC 16.0 (H) 4.0 - 10.5 K/uL   RBC 4.22 3.87 - 5.11 MIL/uL   Hemoglobin 10.6 (L) 12.0 - 15.0 g/dL   HCT 34.5 (L) 36.0 - 46.0 %   MCV 81.8 80.0 - 100.0 fL   MCH 25.1 (L) 26.0 - 34.0 pg   MCHC 30.7 30.0 - 36.0 g/dL   RDW 17.5 (H) 11.5 - 15.5 %   Platelets 231 150 - 400 K/uL   nRBC 0.3 (H) 0.0 - 0.2 %   Neutrophils Relative % 86 %   Neutro Abs 13.6 (H) 1.7 - 7.7 K/uL   Lymphocytes Relative 2 %   Lymphs Abs 0.4 (L) 0.7 - 4.0 K/uL   Monocytes Relative 11 %   Monocytes Absolute 1.8 (H) 0.1 - 1.0 K/uL   Eosinophils Relative 0 %   Eosinophils Absolute 0.0 0.0 - 0.5 K/uL   Basophils Relative 0 %   Basophils Absolute 0.0 0.0 - 0.1 K/uL   Immature Granulocytes 1 %   Abs Immature Granulocytes 0.20 (H) 0.00 - 0.07 K/uL    Comment: Performed at [facility], [place] [address] [place], [place] [address]  CBG monitoring, ED     Status: Abnormal   Collection Time: 06/15/18  8:09 PM  Result Value Ref Range   Glucose-Capillary 48 (L) 70 - 99 mg/dL  CBG monitoring, ED     Status: None   Collection Time: 06/15/18  9:59 PM  Result Value Ref Range   Glucose-Capillary 81 70 - 99 mg/dL  Blood gas, venous     Status: Abnormal   Collection Time: 06/15/18 10:30 PM  Result Value Ref Range   pH, Ven 7.392 7.250 - 7.430   pCO2, Ven 44.9 44.0 - 60.0 mmHg   pO2, Ven BELOW REPORTABLE RANGE 32.0 - 45.0 mmHg    Comment: CRITICAL RESULT CALLED TO, READ BACK BY AND VERIFIED WITH: [place] GARRISON, RN AT 2238 BY T. KNAPP, RRT, RCP ON 06/15/18    Bicarbonate 26.7 20.0 - 28.0 mmol/L   Acid-Base Excess 2.1 (H) 0.0 - 2.0 mmol/L   O2 Saturation 24.5 %   Patient temperature 98.6    Collection  site VEIN    Drawn by COLLECTED BY NURSE    Sample type VEIN      Comment: Performed at [place] [address], [place], [place] [address]  I-Stat Beta hCG blood, ED (MC, [facility], AP only)     Status: Abnormal   Collection Time: 06/15/18 10:40 PM  Result Value Ref Range   I-stat hCG, quantitative 6.5 (H) <5 mIU/mL   Comment 3            Comment:   GEST. AGE      CONC.  (mIU/mL)   <=1 WEEK        5 - 50     2 WEEKS       50 - 500     3 WEEKS       100 - 10,000     4 WEEKS     1,000 - 30,000        FEMALE AND NON-PREGNANT FEMALE:     LESS THAN 5 mIU/mL   Urinalysis, Routine w reflex microscopic     Status: Abnormal   Collection Time: 06/15/18 10:48 PM  Result Value Ref Range   Color, Urine AMBER (A) YELLOW    Comment: BIOCHEMICALS MAY BE AFFECTED BY COLOR   APPearance CLEAR CLEAR   Specific Gravity, Urine 1.018 1.005 - 1.030   pH 6.0 5.0 - 8.0   Glucose, UA 50 (A) NEGATIVE mg/dL   Hgb urine dipstick MODERATE (A) NEGATIVE   Bilirubin Urine NEGATIVE NEGATIVE   Ketones, ur NEGATIVE NEGATIVE mg/dL   Protein, ur NEGATIVE NEGATIVE mg/dL   Nitrite NEGATIVE NEGATIVE   Leukocytes, UA NEGATIVE NEGATIVE   RBC / HPF 21-50 0 - 5 RBC/hpf   WBC, UA 0-5 0 - 5 WBC/hpf   Bacteria, UA NONE SEEN NONE SEEN   Squamous Epithelial / LPF 0-5 0 - 5   Mucus PRESENT     Comment: Performed at [facility], [place] [address] [place], [place] [address]  Creatinine, urine, random     Status: None   Collection Time: 06/15/18 10:48 PM  Result Value Ref Range   Creatinine, Urine 66.42 mg/dL    Comment: Performed at [facility], [place] [address] [place], [place] [address]  Sodium, urine, random     Status: None   Collection Time: 06/15/18 10:48 PM  Result Value Ref Range   Sodium, Ur <10 mmol/L    Comment: Performed at [facility], [place] [address] [place], [place] [address]  Osmolality, urine     Status: None   Collection Time: 06/15/18 10:48 PM  Result Value Ref Range   Osmolality, Ur 608 300 -  900 mOsm/kg    Comment: PERFORMED AT [facility] Performed at [facility], [place] [address], [place], [place] [address]   Ammonia     Status: None   Collection Time: 06/15/18 10:48 PM  Result Value Ref Range   Ammonia 25 9 - 35 umol/L    Comment: Performed at [facility], [place] [address], [place], [place] [address]  Protime-INR     Status: Abnormal   Collection Time: 06/15/18 10:48 PM  Result Value Ref Range   Prothrombin Time 19.6 (H) 11.4 - 15.2 seconds   INR 1.68     Comment: Performed at [facility], [place] [address], [place], [place] [address]  Basic metabolic panel     Status: Abnormal   Collection Time:  06/15/18 10:48 PM  Result Value Ref Range   Sodium 131 (L) 135 - 145 mmol/L   Potassium 5.0 3.5 - 5.1 mmol/L   Chloride 92 (L) 98 - 111 mmol/L   CO2 23 22 - 32 mmol/L   Glucose, Bld 87 70 - 99 mg/dL   BUN 26 (H) 6 - 20 mg/dL   Creatinine, Ser 0.48 0.44 - 1.00 mg/dL   Calcium 8.4 (L) 8.9 - 10.3 mg/dL   GFR calc non Af Amer >60 >60 mL/min   GFR calc Af Amer >60 >60 mL/min   Anion gap 16 (H) 5 - 15    Comment: Performed at [facility], [place] [address] [place], [place] [address]  Phosphorus     Status: None   Collection Time: 06/15/18 10:48 PM  Result Value Ref Range   Phosphorus 3.4 2.5 - 4.6 mg/dL    Comment: Performed at [facility], [place] [address], [place], [place] [address]  Magnesium     Status: Abnormal   Collection Time: 06/15/18 10:48 PM  Result Value Ref Range   Magnesium 2.8 (H) 1.7 - 2.4 mg/dL    Comment: Performed at [facility], [place] [address], [place], [place] [address]  Lactate dehydrogenase     Status: Abnormal   Collection Time: 06/15/18 10:48 PM  Result Value Ref Range   LDH 1,812 (H) 98 - 192 U/L    Comment: Performed at [facility], [place] [address], [place], [place] [address]  Uric acid     Status: None   Collection Time:  06/15/18 10:48 PM  Result Value Ref Range   Uric Acid, Serum 6.5 2.5 - 7.1 mg/dL    Comment: Performed at [facility], [place] [address] [place], [place] [address]  Culture, blood (x 2)     Status: None (Preliminary result)   Collection Time: 06/16/18  1:22 AM  Result Value Ref Range   Specimen Description      BLOOD RIGHT UPPER ARM Performed at [facility], [place] [address], [place], [place] [address]    Special Requests      BOTTLES DRAWN AEROBIC AND ANAEROBIC Blood Culture adequate volume Performed at [facility] [address], [place], [place] [address]    Culture PENDING    Report Status PENDING   Lactic acid, plasma     Status: Abnormal   Collection Time: 06/16/18  1:31 AM  Result Value Ref Range   Lactic Acid, Venous 6.2 (HH) 0.5 - 1.9 mmol/L    Comment: CRITICAL RESULT CALLED TO, READ BACK BY AND VERIFIED WITH: Margorie John RN [phone] 12.05.19 A NAVARRO Performed at [facility], [place] [address] [place], [place] [address]   Procalcitonin     Status: None   Collection Time: 06/16/18  1:31 AM  Result Value Ref Range   Procalcitonin 15.50 ng/mL    Comment:        Interpretation: PCT >= 10 ng/mL: Important systemic inflammatory response, almost exclusively due to severe bacterial sepsis or septic shock. (NOTE)       Sepsis PCT Algorithm           Lower Respiratory Tract                                      Infection PCT Algorithm    ----------------------------     ----------------------------  PCT < 0.25 ng/mL                PCT < 0.10 ng/mL         Strongly encourage             Strongly discourage   discontinuation of antibiotics    initiation of antibiotics    ----------------------------     -----------------------------       PCT 0.25 - 0.50 ng/mL            PCT 0.10 - 0.25 ng/mL               OR       >80% decrease in PCT            Discourage initiation of                                             antibiotics      Encourage discontinuation           of antibiotics    ----------------------------     -----------------------------         PCT >= 0.50 ng/mL              PCT 0.26 - 0.50 ng/mL                AND       <80% decrease in PCT             Encourage initiation of                                             antibiotics       Encourage continuation           of antibiotics    ----------------------------     -----------------------------        PCT >= 0.50 ng/mL                  PCT > 0.50 ng/mL               AND         increase in PCT                  Strongly encourage                                      initiation of antibiotics    Strongly encourage escalation           of antibiotics                                     -----------------------------                                           PCT <= 0.25 ng/mL  OR                                        > 80% decrease in PCT                                     Discontinue / Do not initiate                                             antibiotics Performed at [place] [address], [place], [place] [address]   APTT     Status: Abnormal   Collection Time: 06/16/18  1:31 AM  Result Value Ref Range   aPTT 39 (H) 24 - 36 seconds    Comment:        IF BASELINE aPTT IS ELEVATED, SUGGEST PATIENT RISK ASSESSMENT BE USED TO DETERMINE APPROPRIATE ANTICOAGULANT THERAPY. Performed at [facility], [place] [address], [place], [place] [address]   CBG monitoring, ED     Status: None   Collection Time: 06/16/18  1:48 AM  Result Value Ref Range   Glucose-Capillary 92 70 - 99 mg/dL  Magnesium     Status: Abnormal   Collection Time: 06/16/18  5:02 AM  Result Value Ref Range   Magnesium 2.7 (H) 1.7 - 2.4 mg/dL    Comment: Performed at [facility], [place] [address], [place], [place] [address]  Phosphorus     Status: None    Collection Time: 06/16/18  5:02 AM  Result Value Ref Range   Phosphorus 2.9 2.5 - 4.6 mg/dL    Comment: Performed at [facility], [place] [address], [place], [place] [address]  TSH     Status: None   Collection Time: 06/16/18  5:02 AM  Result Value Ref Range   TSH 1.844 0.350 - 4.500 uIU/mL    Comment: Performed by a 3rd Generation assay with a functional sensitivity of <=0.01 uIU/mL. Performed at [facility], [place] [address], [place], [place] [address]   Comprehensive metabolic panel     Status: Abnormal   Collection Time: 06/16/18  5:02 AM  Result Value Ref Range   Sodium 132 (L) 135 - 145 mmol/L   Potassium 4.9 3.5 - 5.1 mmol/L   Chloride 92 (L) 98 - 111 mmol/L   CO2 24 22 - 32 mmol/L   Glucose, Bld 84 70 - 99 mg/dL   BUN 22 (H) 6 - 20 mg/dL   Creatinine, Ser 0.43 (L) 0.44 - 1.00 mg/dL   Calcium 8.6 (L) 8.9 - 10.3 mg/dL   Total Protein 7.2 6.5 - 8.1 g/dL   Albumin 2.1 (L) 3.5 - 5.0 g/dL   AST 585 (H) 15 - 41 U/L   ALT 162 (H) 0 - 44 U/L   Alkaline Phosphatase 684 (H) 38 - 126 U/L   Total Bilirubin 6.3 (H) 0.3 - 1.2 mg/dL   GFR calc non Af Amer >60 >60 mL/min   GFR calc Af Amer >60 >60 mL/min   Anion gap 16 (H) 5 - 15    Comment: Performed at [facility], [place] [address], [place], [place] [address]  CBC  Status: Abnormal   Collection Time: 06/16/18  5:02 AM  Result Value Ref Range   WBC 15.1 (H) 4.0 - 10.5 K/uL   RBC 3.72 (L) 3.87 - 5.11 MIL/uL   Hemoglobin 9.4 (L) 12.0 - 15.0 g/dL   HCT 30.8 (L) 36.0 - 46.0 %   MCV 82.8 80.0 - 100.0 fL   MCH 25.3 (L) 26.0 - 34.0 pg   MCHC 30.5 30.0 - 36.0 g/dL   RDW 17.8 (H) 11.5 - 15.5 %   Platelets 181 150 - 400 K/uL   nRBC 0.1 0.0 - 0.2 %    Comment: Performed at [facility], [place] [address], [place], [place] [address]  Lactic acid, plasma     Status: Abnormal   Collection Time: 06/16/18  5:02 AM  Result Value Ref Range   Lactic Acid, Venous 5.4 (HH)  0.5 - 1.9 mmol/L    Comment: GARRISON,GEORG[place] RN AT 0602 06/16/18 BY TIBBITTS,K Performed at [facility], [place] [address], [place], [place] [address]   CBG monitoring, ED     Status: None   Collection Time: 06/16/18  5:16 AM  Result Value Ref Range   Glucose-Capillary 77 70 - 99 mg/dL  CBG monitoring, ED     Status: Abnormal   Collection Time: 06/16/18  7:30 AM  Result Value Ref Range   Glucose-Capillary 61 (L) 70 - 99 mg/dL   Dg Chest 2 View  Result Date: 06/15/2018 CLINICAL DATA:  Weakness. EXAM: CHEST - 2 VIEW COMPARISON:  Multiple chest x-rays since May 23, 2018 FINDINGS: Haziness over the left base is consistent with overlapping soft tissues. The heart, hila, mediastinum, lungs, and pleura are otherwise unchanged with no acute interval change. IMPRESSION: No acute interval change. Electronically Signed   By: Dorise Bullion III M.D   On: 06/15/2018 18:41   Dg Abd 1 View  Result Date: 06/15/2018 CLINICAL DATA:  Obstipation EXAM: ABDOMEN - 1 VIEW COMPARISON:  05/13/2018 FINDINGS: Prior cholecystectomy. Nonobstructive bowel gas pattern. No free air. Probable enlarged liver, likely related to known metastatic liver disease. Possible ascites in the abdomen with generalized loss of organ margins. No acute bony abnormality. IMPRESSION: Prior cholecystectomy.  No evidence of bowel obstruction. Probable hepatomegaly.  Suspect ascites. Electronically Signed   By: Rolm Baptise M.D.   On: 06/15/2018 23:31   US Abdomen Complete  Result Date: 06/15/2018 CLINICAL DATA:  37 year old female with history of metastatic breast cancer presenting with elevated LFTs. EXAM: ABDOMEN ULTRASOUND COMPLETE COMPARISON:  CT of the abdomen pelvis dated 05/23/2018 FINDINGS: Gallbladder: Cholecystectomy. Common bile duct: Diameter: 2 mm. Liver: The liver is heterogeneous with innumerable hypoechoic masses essentially replacing the liver parenchyma consistent with metastatic disease. 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 Portal vein is patent on color Doppler imaging with normal direction of blood flow towards the liver. IVC: No abnormality visualized. Pancreas: Visualized portion unremarkable. Spleen: Size and appearance within normal limits. Right Kidney: Length: 10.2 cm. Normal echogenicity. No hydronephrosis or shadowing stone. Left Kidney: Length: 11.4 cm. Normal echogenicity. No hydronephrosis or shadowing stone. Abdominal aorta: No aneurysm visualized. Other findings: There is a small amount of ascites. IMPRESSION: 1. 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 2. Small ascites. Electronically Signed   By: Anner Crete M.D.   On: 06/15/2018 22:20    Pending Labs Unresulted Labs (From admission, onward)    Start     Ordered   06/16/18  0121  Culture, blood (x 2)  BLOOD CULTURE X 2,   STAT    Comments:  INITIATE ANTIBIOTICS WITHIN 1 HOUR AFTER BLOOD CULTURES DRAWN.  If unable to obtain blood cultures, call MD immediately regarding antibiotic instructions.    06/16/18 0121   06/15/18 2310  Pregnancy, urine  ONCE - STAT,   STAT     06/15/18 2309          Vitals/Pain Today's Vitals   06/16/18 0630 06/16/18 0700 06/16/18 0727 06/16/18 0800  BP: 129/69 106/74  (!) 121/97  Pulse: (!) 102 (!) 105  (!) 113  Resp: 15 19  (!) 22  Temp:      TempSrc:      SpO2: 100% 99%  99%  Weight:      Height:      PainSc:   5      Isolation Precautions No active isolations  Medications Medications  fentaNYL (SUBLIMAZE) injection 50 mcg (50 mcg Intravenous Given 06/16/18 0749)  oxyCODONE (Oxy IR/ROXICODONE) immediate release tablet 2.5 mg (has no administration in time range)  docusate sodium (COLACE) capsule 100 mg (has no administration in time range)  sucralfate (CARAFATE) tablet 0.5-1 g (has no administration in time range)  acetaminophen (TYLENOL) tablet 650 mg (has no administration in  time range)    Or  acetaminophen (TYLENOL) suppository 650 mg (has no administration in time range)  ondansetron (ZOFRAN) tablet 4 mg (has no administration in time range)    Or  ondansetron (ZOFRAN) injection 4 mg (has no administration in time range)  0.9 %  sodium chloride infusion ( Intravenous New Bag/Given 06/16/18 0130)  thiamine (B-1) injection 100 mg (has no administration in time range)  magic mouthwash (has no administration in time range)  dextrose 5 % solution ( Intravenous New Bag/Given 06/16/18 0748)  sodium chloride 0.9 % bolus 1,000 mL (0 mLs Intravenous Stopped 06/15/18 2044)  thiamine (B-1) injection 100 mg (100 mg Intravenous Given 06/15/18 2024)  dextrose 50 % solution 50 mL (50 mLs Intravenous Given 06/15/18 2025)  vancomycin (VANCOCIN) IVPB 1000 mg/200 mL premix (0 mg Intravenous Stopped 06/16/18 0351)  sodium chloride 0.9 % bolus 500 mL (0 mLs Intravenous Stopped 06/16/18 0430)  dextrose 50 % solution 50 mL (50 mLs Intravenous Given 06/16/18 0743)    Mobility walks with device

## 2018-06-16 NOTE — Progress Notes (Signed)
Initial Nutrition Assessment  DOCUMENTATION CODES:   Non-severe (moderate) malnutrition in context of chronic illness  INTERVENTION:  - Recommend diet liberalization from Heart Healthy to Regular. - Will order daily multivitamin with minerals.  - Family okay with bringing in patient's oral nutrition supplement of choice. - Provided patient and husband with coupons for Orgain.  - Continue to encourage PO intakes.   NUTRITION DIAGNOSIS:   Moderate Malnutrition related to chronic illness, catabolic illness, cancer and cancer related treatments as evidenced by moderate fat depletion, moderate muscle depletion.  GOAL:   Patient will meet greater than or equal to 90% of their needs  MONITOR:   PO intake, Supplement acceptance, Weight trends, Labs  REASON FOR ASSESSMENT:   Consult Malnutrition Eval  ASSESSMENT:   37 y.o. female with medical history significant of metastatic breast cancer (liver, bones, ribs, spine) presents with weakness, anorexia, nausea, and increasing pain. Patient's last radiation was on 12/3 and plan was for initiation of palliative chemo next week. Patient started to have worsening pain which is severe and does not improve with home narcotics.  BMI indicates normal weight. No intakes documented since admission. Patient previously admitted 11/11-11/9 and flow sheet indicates that she was eating 25-50% during that admission. Patient in pain during RD visit so visit was somewhat brief.  She was unable to consume anything PO this AM d/t severe oral and esophageal pain. She reports that spray and swish and swallow mouth rinse helped last night and she was able to drink a little after that.  A bottle of Orgain protein shake on bedside table. Patient and husband, who is at bedside, indicate that this is her oral nutrition supplement of choice. Offered to provide them with coupons for this supplement; patient and husband very appreciative.  Per chart review, current  weight is 118 lb and weight has been relatively stable over the past 2 months. On 11/10/17 she weighed 134 kcal. This indicates 16 lb weight loss (12% body weight) in the past 7 months.  Patient has not seen by an RD since January 2017.    Medications reviewed; 100 mg IV Diflucan/day, 5 mL magic mouthwash TID PRN, 0.5-1 g Carafate/day, 100 mg IV thiamine/day. Labs reviewed; CBGs: 61-96 mg/dL today, Na: 132 mmol/L, Cl: 92 mmol/L, BUN: 22 mg/dL, creatinine: 0.43 mg/dL, Ca: 8.6 mg/dL, Mg: 2.7 mg/dL, Alk Phos elevated, LFTs elevated. IVF; D5 @ 50 mL/hr (204 kcal).    NUTRITION - FOCUSED PHYSICAL EXAM:    Most Recent Value  Orbital Region  Moderate depletion  Upper Arm Region  Moderate depletion  Thoracic and Lumbar Region  Unable to assess  Buccal Region  Moderate depletion  Temple Region  Mild depletion  Clavicle Bone Region  Moderate depletion  Clavicle and Acromion Bone Region  Moderate depletion  Scapular Bone Region  Unable to assess  Dorsal Hand  Mild depletion  Patellar Region  Unable to assess  Anterior Thigh Region  Unable to assess  Posterior Calf Region  Unable to assess  Edema (RD Assessment)  Unable to assess  Hair  Reviewed  Eyes  Reviewed  Mouth  Reviewed  Skin  Reviewed  Nails  Reviewed       Diet Order:   Diet Order            Diet Heart Room service appropriate? Yes; Fluid consistency: Thin  Diet effective now              EDUCATION NEEDS:   Not appropriate for education  at this time  Skin:  Skin Assessment: Reviewed RN Assessment  Last BM:  PTA/unknown  Height:   Ht Readings from Last 1 Encounters:  06/16/18 '5\' 3"'$  (1.6 m)    Weight:   Wt Readings from Last 1 Encounters:  06/16/18 53.4 kg    Ideal Body Weight:  52.27 kg  BMI:  Body mass index is 20.85 kg/m.  Estimated Nutritional Needs:   Kcal:  5183-4373 (35-40 kcal/kg)  Protein:  95-107 grams (1.8-2 grams/kg)  Fluid:  >/= 1.8 L/day     Jarome Matin, MS, RD, LDN,  [facility] Inpatient Clinical Dietitian Pager # [phone] After hours/weekend pager # [phone]

## 2018-06-16 NOTE — Progress Notes (Signed)
CRITICAL VALUE ALERT  Critical Value: Lactic acid 5.7  Date & Time Notied:  06/16/2018 1818  Provider Notified: Dr. Tyrell Antonio  Orders Received/Actions taken: Awaiting orders.

## 2018-06-16 NOTE — Progress Notes (Signed)
PT Cancellation Note  Patient Details Name: Dawn Haynes MRN: [id number] DOB: 09-02-1980   Cancelled Treatment:    Reason Eval/Treat Not Completed: Will attempt PT eval on 12/6 if appropriate. Will await palliative care recommendations as well. Thanks.    Weston Anna, PT Acute Rehabilitation Services Pager: [phone] Office: [phone]

## 2018-06-16 NOTE — Progress Notes (Signed)
PROGRESS NOTE    Dawn Haynes  OZD:[id number] DOB: 08/19/1980 DOA: 06/15/2018 PCP: [name], PA    Brief Narrative; 23 very pleasant female with medical history significant for metastatic breast cancer, status post thoracic tumor removal, epicardial effusion is status post pericardial window November 2019 who presented to the emergency department with generalized weakness.  The patient has been having worsening left lateral abdominal pain.  No nausea no vomiting.  She has chronic abdominal pain due to hepatomegaly.  She has been more sleepy, she has not been able to eat due to pain when she is swallow.  Patient was noted to have hypoglycemia, hyperkalemia, and worsening increased liver function test.  Assessment & Plan:   Active Problems:   Ascites   Abdominal pain   Metastasis to liver (HCC)   Hyponatremia   Hyperkalemia   Transaminitis   Ascites, malignant   Hypotension   Hypoglycemia   Elevated lactic acid level  1-Abdominal pain: Transaminases; likely related to underlying malignancy, hepatomegaly. Ultrasound was negative for any evidence of bile duct obstruction. KUB negative for obstruction.   2-Hyponatremia; Continue with IV fluid.  Metastatic  breast cancer, to liver Dr [name], saw patient in consultation.  He is recommending hospice care. Palliative to follow patient.  Hyperkalemia; Kayexalate given.  Ascites malignant; Small amount of ascites on  ultrasound.  Hypertension setting of dehydration.  Continue IV fluid  Hypoglycemia start D5. Dysphagia; concern with esophagitis.  Carafate.  Fluconazole to treat for Candida esophagitis Elevated lactic acidosis;, leukocytosis.  We will start empirically ceftriaxone.  She is complaining of cough Will repeat chest x-ray when more hydrated  RN Pressure Injury Documentation:    Malnutrition Type:      Malnutrition Characteristics:      Nutrition Interventions:     Estimated body mass index is  20.37 kg/m as calculated from the following:   Height as of this encounter: 5\' 3"  (1.6 m).   Weight as of this encounter: 52.2 kg.   DVT prophylaxis: SCDs Code Status: Full code Family Communication: Care discussed with husband who was at bedside Disposition Plan: Remain in the hospital for IV fluids, pain management.  Consultants:   Palliative care  Dr. Sonny Dandy   Procedures:  Ultrasound; 1. 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 2. Small ascites   Antimicrobials:   Ceftriaxone 12 10/15/2017   Subjective: She is alert.  She reports inability to eat or drink, due to severe pain when she swallow. She is still complaining of abdominal pain.  Reports productive cough, greenish. Objective: Vitals:   06/16/18 0600 06/16/18 0630 06/16/18 0700 06/16/18 0800  BP: 112/80 129/69 106/74 (!) 121/97  Pulse: (!) 103 (!) 102 (!) 105 (!) 113  Resp: (!) 21 15 19  (!) 22  Temp:      TempSrc:      SpO2: 100% 100% 99% 99%  Weight:      Height:        Intake/Output Summary (Last 24 hours) at 06/16/2018 0828 Last data filed at 06/16/2018 0430 Gross per 24 hour  Intake 1693.21 ml  Output -  Net 1693.21 ml   Filed Weights   06/15/18 1649  Weight: 52.2 kg    Examination:  General exam: Thin appearing Respiratory system: Clear to auscultation Cardiovascular system: S1 & S2 heard, RRR. No JVD, murmurs, rubs, gallops or clicks. No pedal edema. Gastrointestinal system: Distended, hepatomegaly, generalized tender on palpation. Central nervous system: Alert and oriented.  Extremities: Symmetric 5 x 5 power. Skin: No rashes, lesions or ulcers   Data Reviewed: I have personally reviewed following labs and imaging studies  CBC: Recent Labs  Lab 06/15/18 1707 06/16/18 0502  WBC 16.0* 15.1*  NEUTROABS 13.6*  --   HGB 10.6* 9.4*  HCT 34.5* 30.8*  MCV 81.8 82.8  PLT 231 732   Basic Metabolic Panel: Recent Labs  Lab  06/15/18 1707 06/15/18 2248 06/16/18 0502  NA 129* 131* 132*  K 5.6* 5.0 4.9  CL 86* 92* 92*  CO2 26 23 24   GLUCOSE 68* 87 84  BUN 32* 26* 22*  CREATININE 0.44 0.48 0.43*  CALCIUM 9.6 8.4* 8.6*  MG  --  2.8* 2.7*  PHOS  --  3.4 2.9   GFR: Estimated Creatinine Clearance: 78.6 mL/min (A) (by C-G formula based on SCr of 0.43 mg/dL (L)). Liver Function Tests: Recent Labs  Lab 06/15/18 1707 06/16/18 0502  AST 674* 585*  ALT 191* 162*  ALKPHOS 768* 684*  BILITOT 6.6* 6.3*  PROT 8.2* 7.2  ALBUMIN 2.6* 2.1*   Recent Labs  Lab 06/15/18 1707  LIPASE 35   Recent Labs  Lab 06/15/18 2248  AMMONIA 25   Coagulation Profile: Recent Labs  Lab 06/15/18 2248  INR 1.68   Cardiac Enzymes: No results for input(s): CKTOTAL, CKMB, CKMBINDEX, TROPONINI in the last 168 hours. BNP (last 3 results) No results for input(s): PROBNP in the last 8760 hours. HbA1C: No results for input(s): HGBA1C in the last 72 hours. CBG: Recent Labs  Lab 06/15/18 2009 06/15/18 2159 06/16/18 0148 06/16/18 0516 06/16/18 0730  GLUCAP 48* 81 92 77 61*   Lipid Profile: No results for input(s): CHOL, HDL, LDLCALC, TRIG, CHOLHDL, LDLDIRECT in the last 72 hours. Thyroid Function Tests: Recent Labs    06/16/18 0502  TSH 1.844   Anemia Panel: No results for input(s): VITAMINB12, FOLATE, FERRITIN, TIBC, IRON, RETICCTPCT in the last 72 hours. Sepsis Labs: Recent Labs  Lab 06/16/18 0131 06/16/18 0502  PROCALCITON 15.50  --   LATICACIDVEN 6.2* 5.4*    Recent Results (from the past 240 hour(s))  Culture, blood (x 2)     Status: None (Preliminary result)   Collection Time: 06/16/18  1:22 AM  Result Value Ref Range Status   Specimen Description   Final    BLOOD RIGHT UPPER ARM Performed at [facility], [address], [place], [place] [address]    Special Requests   Final    BOTTLES DRAWN AEROBIC AND ANAEROBIC Blood Culture adequate volume Performed at [facility] [address], [place], [place] [address]    Culture PENDING  Incomplete   Report Status PENDING  Incomplete         Radiology Studies: Dg Chest 2 View  Result Date: 06/15/2018 CLINICAL DATA:  Weakness. EXAM: CHEST - 2 VIEW COMPARISON:  Multiple chest x-rays since May 23, 2018 FINDINGS: Haziness over the left base is consistent with overlapping soft tissues. The heart, hila, mediastinum, lungs, and pleura are otherwise unchanged with no acute interval change. IMPRESSION: No acute interval change. Electronically Signed   By: Dorise Bullion III M.D   On: 06/15/2018 18:41   Dg Abd 1 View  Result Date: 06/15/2018 CLINICAL DATA:  Obstipation EXAM: ABDOMEN - 1 VIEW COMPARISON:  05/13/2018 FINDINGS: Prior cholecystectomy. Nonobstructive bowel gas pattern. No free air. Probable enlarged liver, likely related to known metastatic liver disease. Possible ascites in the abdomen with generalized loss  of organ margins. No acute bony abnormality. IMPRESSION: Prior cholecystectomy.  No evidence of bowel obstruction. Probable hepatomegaly.  Suspect ascites. Electronically Signed   By: Rolm Baptise M.D.   On: 06/15/2018 23:31   US Abdomen Complete  Result Date: 06/15/2018 CLINICAL DATA:  37 year old female with history of metastatic breast cancer presenting with elevated LFTs. EXAM: ABDOMEN ULTRASOUND COMPLETE COMPARISON:  CT of the abdomen pelvis dated 05/23/2018 FINDINGS: Gallbladder: Cholecystectomy. Common bile duct: Diameter: 2 mm. Liver: The liver is heterogeneous with innumerable hypoechoic masses essentially replacing the liver parenchyma consistent with metastatic disease. 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 Portal vein is patent on color Doppler imaging with normal direction of blood flow towards the liver. IVC: No abnormality visualized. Pancreas: Visualized portion unremarkable. Spleen: Size and appearance within normal limits. Right Kidney: Length: 10.2 cm.  Normal echogenicity. No hydronephrosis or shadowing stone. Left Kidney: Length: 11.4 cm. Normal echogenicity. No hydronephrosis or shadowing stone. Abdominal aorta: No aneurysm visualized. Other findings: There is a small amount of ascites. IMPRESSION: 1. 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 2. Small ascites. Electronically Signed   By: Anner Crete M.D.   On: 06/15/2018 22:20        Scheduled Meds: . sucralfate  0.5-1 g Oral TID WC & HS  . thiamine injection  100 mg Intravenous Daily   Continuous Infusions: . sodium chloride 75 mL/hr at 06/16/18 0130  . cefTRIAXone (ROCEPHIN)  IV    . dextrose 50 mL/hr at 06/16/18 0748  . fluconazole (DIFLUCAN) IV       LOS: 0 days    Time spent: 35 minutes     Elmarie Shiley, MD [facility] Pager [phone]  If 7PM-7AM, please contact night-coverage [email or web address] Password [email or web address] 06/16/2018, 8:28 AM

## 2018-06-16 NOTE — ED Notes (Signed)
One set of blood cultures obtained, unable to obtain second set.

## 2018-06-16 NOTE — ED Notes (Signed)
Date and time results received: 06/16/18 0253 (use smartphrase ".now" to insert current time)  Test: Lactic Acid Critical Value: 6.2  Name of Provider Notified: K. Schorr via text  Orders Received? Or Actions Taken?: Orders Received - See Orders for details

## 2018-06-16 NOTE — Progress Notes (Signed)
HEMATOLOGY-ONCOLOGY PROGRESS NOTE  SUBJECTIVE: Metastatic triple negative breast cancer relapsed disease Severe and worsening liver metastases Cord compression status post surgery and radiation Severe malnutrition due to decreased oral intake and frailty Multiple pain issues related to esophagitis as well as liver capsular pain Severe jaundice     Breast cancer of upper-outer quadrant of left female breast ([place])   07/02/2015 Mammogram    Left breast mass 5.3 x 5.2 x 2.7 cm at 1:30 position, multiple abnormal enlarged left axillary lymph nodes    07/09/2015 Initial Diagnosis    Left breast biopsy 1:30: IDC grade 3, ER PR 0%, HER-2 negative ratio 1.65, Ki-67 90%; left axillary lymph node biopsy: IDC grade 3 completely replacing the lymph node ER PR 0%, HER-2 negative ratio 1.22 Ki-67 90%    07/25/2015 -  Chemotherapy    Palliative chemotherapy: Carboplatin and gemcitabine day 1 and day 8 every 3 weeks ( metastatic breast cancer with extensive liver metastases and malignant recurrent ascites), switched to gemcitabine every 2 weeks    12/02/2015 Imaging    CT CAP: Decrease in the liver metastases now measuring 7 x 5 cm was previously 8.4 x 9.3 cm.    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 Metastatic deposit diffusely infiltrates the T3 and T4 vertebrae with pathologic fracture of the T3 vertebra contributing to cord compression. Extensive epidural tumor spread seen dorsally from T1-T8 (see postcontrast sagittal imaging). T3-4 and T4-5 foraminal compromise by tumor. There is asymmetric left  paravertebral extension which infiltrates the left third and fourth ribs.    04/29/2018 Surgery    T3 corpecotmy and anterior T2-4 instrumentation, pathology metastatic breast cancer CK7 is strong and diffusely positive. CK20, TTF-1, GATA 3, and Qualitative ER are negative    05/23/2018 Imaging    CT CAP: New large pericardial effusion, significant progression of metastatic disease with near replacement of liver with metastatic cancer, new pulmonary nodules, erosive bony changes T2-T3 left second and third ribs, right upper lobe?  Lymphangitic carcinomatosis    05/26/2018 Surgery    Pericardial window, pathology negative for malignancy    05/31/2018 -  Chemotherapy    The patient had eriBULin mesylate ([place]) 2.25 mg in sodium chloride 0.9 % 100 mL chemo infusion, 1.4 mg/m2 = 2.25 mg, Intravenous,  Once, 0 of 6 cycles  for chemotherapy treatment.      Breast cancer ([place])   12/24/2016 Initial Diagnosis    Breast cancer ([place])    01/10/2017 Genetic Testing    Genetic counseling and testing for hereditary cancer syndromes were performed on 01/05/2017. Results are negative for pathogenic mutations in 46 genes analyzed by [facility]'s Common Hereditary Cancers Panel. Results are dated 01/10/2017. Genes tested: APC, ATM, AXIN2, BARD1, BMPR1A, BRCA1, BRCA2, BRIP1, CDH1, CDKN2A, CHEK2, CTNNA1, DICER1, EPCAM, GREM1, HOXB13, KIT, MEN1, MLH1, MSH2, MSH3, MSH6, MUTYH, NBN, NF1, NTHL1, PALB2, PDGFRA, PMS2, POLD1, POLE, PTEN, RAD50, RAD51C, RAD51D, SDHA, SDHB, SDHC, SDHD, SMAD4, SMARCA4, STK11, TP53, TSC1, TSC2, and VHL.          OBJECTIVE: REVIEW OF SYSTEMS:  Severe esophagitis, abdominal pain, decreased oral intake, poor appetite, frailty    PHYSICAL EXAMINATION: ECOG PERFORMANCE STATUS: 4 - Bedbound  Vitals:   06/16/18 1100 06/16/18 1200  BP: 120/75   Pulse: (!) 109   Resp: (!) 22   Temp:  97.7 F (36.5 C)  SpO2: 100%    Filed Weights   06/15/18 1649 06/16/18 0834  Weight: 115 lb  (52.2 kg) 117 lb 11.6 oz (53.4 kg)    GENERAL:alert, no distress and comfortable SKIN: Jaundice EYES: Yellow sclera OROPHARYNX:no exudate, no erythema and lips, buccal mucosa, and tongue normal  NECK: supple, thyroid normal size, non-tender, without nodularity LYMPH:  no palpable lymphadenopathy in the cervical, axillary or inguinal LUNGS: clear to auscultation and percussion with normal breathing effort HEART: regular rate & rhythm and no murmurs and no lower extremity edema ABDOMEN: Massive hepatomegaly tenderness Musculoskeletal:no cyanosis of digits and no clubbing  NEURO: alert & oriented x 3 with fluent speech, generalized weakness  LABORATORY DATA:  I have reviewed the data as listed CMP Latest Ref Rng & Units 06/16/2018 06/15/2018 06/15/2018  Glucose 70 - 99 mg/dL 84 87 68(L)  BUN 6 - 20 mg/dL 22(H) 26(H) 32(H)  Creatinine 0.44 - 1.00 mg/dL 0.43(L) 0.48 0.44  Sodium 135 - 145 mmol/L 132(L) 131(L) 129(L)  Potassium 3.5 - 5.1 mmol/L 4.9 5.0 5.6(H)  Chloride 98 - 111 mmol/L 92(L) 92(L) 86(L)  CO2 22 - 32 mmol/L '24 23 26  '$ Calcium 8.9 - 10.3 mg/dL 8.6(L) 8.4(L) 9.6  Total Protein 6.5 - 8.1 g/dL 7.2 - 8.2(H)  Total Bilirubin 0.3 - 1.2 mg/dL 6.3(H) - 6.6(H)  Alkaline Phos 38 - 126 U/L 684(H) - 768(H)  AST 15 - 41 U/L 585(H) - 674(H)  ALT 0 - 44 U/L 162(H) - 191(H)    Lab Results  Component Value Date   WBC 15.1 (H) 06/16/2018   HGB 9.4 (L) 06/16/2018   HCT 30.8 (L) 06/16/2018   MCV 82.8 06/16/2018   PLT 181 06/16/2018   NEUTROABS 13.6 (H) 06/15/2018    ASSESSMENT AND PLAN: Metastatic triple negative breast cancer with extensive liver metastases lung metastases and spine metastases I discussed with the patient and her husband that her cancer is rapidly progressing and that she has an extremely poor prognosis Her general frailty, malnutrition, generalized worsened performance status, indicate that she has a very short life expectancy and that chemotherapy would not offer any  significant benefit. In fact it might shorten her life expectancy.  I discussed with her that she has only a few weeks of expected survival and that she needs to make sure that she can spend time with her family.  Please consult hospice and palliative care. I discussed the case with Dr.Regalado.

## 2018-06-16 NOTE — Progress Notes (Signed)
PMT consult received and chart reviewed. VM left for husband, Corby Villasenor to arrange [place] meeting with patient/family.    NO CHARGE  Ihor Dow, [place], FNP-C Palliative Medicine Team  Phone: [phone] Fax: [phone]

## 2018-06-17 ENCOUNTER — Encounter: Payer: Self-pay | Admitting: Radiation Oncology

## 2018-06-17 DIAGNOSIS — R1084 Generalized abdominal pain: Secondary | ICD-10-CM

## 2018-06-17 DIAGNOSIS — Z515 Encounter for palliative care: Secondary | ICD-10-CM

## 2018-06-17 DIAGNOSIS — K7689 Other specified diseases of liver: Secondary | ICD-10-CM

## 2018-06-17 DIAGNOSIS — E860 Dehydration: Secondary | ICD-10-CM

## 2018-06-17 DIAGNOSIS — R180 Malignant ascites: Secondary | ICD-10-CM

## 2018-06-17 DIAGNOSIS — R740 Nonspecific elevation of levels of transaminase and lactic acid dehydrogenase [LDH]: Secondary | ICD-10-CM

## 2018-06-17 DIAGNOSIS — C50919 Malignant neoplasm of unspecified site of unspecified female breast: Secondary | ICD-10-CM

## 2018-06-17 LAB — BASIC METABOLIC PANEL
Anion gap: 14 (ref 5–15)
BUN: 16 mg/dL (ref 6–20)
CHLORIDE: 93 mmol/L — AB (ref 98–111)
CO2: 22 mmol/L (ref 22–32)
CREATININE: 0.34 mg/dL — AB (ref 0.44–1.00)
Calcium: 8.7 mg/dL — ABNORMAL LOW (ref 8.9–10.3)
GFR calc Af Amer: >60 mL/min (ref >60)
GFR calc non Af Amer: >60 mL/min (ref >60)
Glucose, Bld: 77 mg/dL (ref 70–99)
Potassium: 4.9 mmol/L (ref 3.5–5.1)
Sodium: 129 mmol/L — ABNORMAL LOW (ref 135–145)

## 2018-06-17 LAB — GLUCOSE, CAPILLARY
GLUCOSE-CAPILLARY: 121 mg/dL — AB (ref 70–99)
Glucose-Capillary: 61 mg/dL — ABNORMAL LOW (ref 70–99)
Glucose-Capillary: 71 mg/dL (ref 70–99)
Glucose-Capillary: 76 mg/dL (ref 70–99)
Glucose-Capillary: 76 mg/dL (ref 70–99)
Glucose-Capillary: 78 mg/dL (ref 70–99)
Glucose-Capillary: 80 mg/dL (ref 70–99)
Glucose-Capillary: 83 mg/dL (ref 70–99)
Glucose-Capillary: 83 mg/dL (ref 70–99)
Glucose-Capillary: 85 mg/dL (ref 70–99)

## 2018-06-17 LAB — CBC
HCT: 31.1 % — ABNORMAL LOW (ref 36.0–46.0)
Hemoglobin: 9.4 g/dL — ABNORMAL LOW (ref 12.0–15.0)
MCH: 25.5 pg — ABNORMAL LOW (ref 26.0–34.0)
MCHC: 30.2 g/dL (ref 30.0–36.0)
MCV: 84.3 fL (ref 80.0–100.0)
Platelets: 125 10*3/uL — ABNORMAL LOW (ref 150–400)
RBC: 3.69 MIL/uL — ABNORMAL LOW (ref 3.87–5.11)
RDW: 18.4 % — ABNORMAL HIGH (ref 11.5–15.5)
WBC: 19.8 10*3/uL — ABNORMAL HIGH (ref 4.0–10.5)
nRBC: 0.2 % (ref 0.0–0.2)

## 2018-06-17 MED ORDER — DEXTROSE 50 % IV SOLN
INTRAVENOUS | Status: AC
  Administered 2018-06-17: 25 mL
  Filled 2018-06-17: qty 50

## 2018-06-17 MED ORDER — FLEET ENEMA 7-19 GM/118ML RE ENEM: 1.0000 | ENEMA | Freq: Once | RECTAL | Status: DC

## 2018-06-17 MED ORDER — FENTANYL CITRATE (PF) 100 MCG/2ML IJ SOLN
50.0000 ug | INTRAMUSCULAR | Status: DC | PRN
  Administered 2018-06-17 – 2018-06-19 (×14): 75 ug via INTRAVENOUS
  Filled 2018-06-17 (×14): qty 2

## 2018-06-17 MED ORDER — OXYCODONE HCL 5 MG/5ML PO SOLN
10.0000 mg | ORAL | Status: DC | PRN
  Administered 2018-06-19: 10 mg via ORAL
  Filled 2018-06-17: qty 10

## 2018-06-17 MED ORDER — SODIUM CHLORIDE 0.9 % IV SOLN
INTRAVENOUS | Status: DC
  Administered 2018-06-17: 10:00:00 via INTRAVENOUS

## 2018-06-17 MED ORDER — FUROSEMIDE 10 MG/ML IJ SOLN
20.0000 mg | Freq: Once | INTRAMUSCULAR | Status: AC
  Administered 2018-06-17: 20 mg via INTRAVENOUS
  Filled 2018-06-17: qty 2

## 2018-06-17 MED ORDER — SUCRALFATE 1 GM/10ML PO SUSP
1.0000 g | Freq: Three times a day (TID) | ORAL | Status: DC
  Administered 2018-06-17 – 2018-06-19 (×7): 1 g via ORAL
  Filled 2018-06-17 (×8): qty 10

## 2018-06-17 MED ORDER — ORAL CARE MOUTH RINSE
15.0000 mL | Freq: Two times a day (BID) | OROMUCOSAL | Status: DC
  Administered 2018-06-17 – 2018-06-19 (×4): 15 mL via OROMUCOSAL

## 2018-06-17 MED ORDER — BISACODYL 5 MG PO TBEC: 5.0000 mg | DELAYED_RELEASE_TABLET | Freq: Every day | ORAL | Status: DC | PRN

## 2018-06-17 MED ORDER — OXYCODONE HCL 5 MG PO TABS: 10.0000 mg | ORAL_TABLET | ORAL | Status: DC | PRN

## 2018-06-17 MED ORDER — DEXTROSE-NACL 5-0.9 % IV SOLN
INTRAVENOUS | Status: DC
  Administered 2018-06-17 – 2018-06-18 (×2): via INTRAVENOUS

## 2018-06-17 NOTE — Care Management Note (Signed)
Case Management Note  Patient Details  Name: Dawn Haynes MRN: [id number] Date of Birth: 1981/06/22  Subjective/Objective:                  Metastatic triple negative breast cancer relapsed disease Severe and worsening liver metastases Cord compression status post surgery and radiation Severe malnutrition due to decreased oral intake and frailty Multiple pain issues related to esophagitis as well as liver capsular pain Severe jaundice  Action/Plan: awaiting for family conference to [place] Will follow for progression of care and clinical status. Will follow for case management needs none present at this time. Expected Discharge Date:  (unknown)               Expected Discharge Plan:  Home w Hospice Care  In-House Referral:     Discharge planning Services  CM Consult  Post Acute Care Choice:    Choice offered to:     DME Arranged:    DME Agency:     HH Arranged:    HH Agency:     Status of Service:  In process, will continue to follow  If discussed at Long Length of Stay Meetings, dates discussed:    Additional Comments:  Leeroy Cha, RN 06/17/2018, 8:28 AM

## 2018-06-17 NOTE — Progress Notes (Addendum)
°  [facility] Liaison: RN visit  Notified by Velva Harman, [facility] of patient/family request for [facility] services at home after discharge. Chart and patient information under review by [facility] physician. Hospice eligibility pending at this time.  Writer spoke with patient and family at bedside to initiate education related to hospice philosophy, services and team approach to care.   Patient and family verbalized understanding of information given. Per discussion, plan is for possible discharge this weekend to home by personal car.  Please send signed and completed DNR form home with patient/family. Patient will need prescriptions for discharge comfort medications.  DME needs have been discussed, patient currently has no medical equipment in the home. Patient/family requests the following DME for delivery to the home: Hospital bed with half rails, Over the bedside tray, walker, shower chair and bedside commode.[facility] equipment manager has been notified and will contact [facility] to arrange delivery to the home. Home address has been verified and is correct in the chart. [name] (spouse) is the family member to contact to arrange time of delivery.  [facility] aware of the above. Please notify [facility] when patient is ready to leave the unit at discharge. (Call [phone] or [phone] after 5pm.) [facility] information and contact numbers given to patient and mother at time of visit. Above information shared with Velva Harman, [facility].  Please call with any hospice related questions.  Thank you for this referral.  Gar Ponto, [facility] Liaison [phone]

## 2018-06-17 NOTE — Progress Notes (Signed)
PT Cancellation Note  Patient Details Name: Dawn Haynes MRN: [id number] DOB: 1981-05-02   Cancelled Treatment:    Reason Eval/Treat Not Completed: Other (comment) RN reports pt to have palliative meeting later today.  Will await decisions.  Please discontinue order if PT is not indicated.  Thanks.   Alyzah Pelly,KATHrine E 06/17/2018, 10:47 AM Carmelia Bake, PT, DPT Acute Rehabilitation Services Office: [phone] Pager: [phone]

## 2018-06-17 NOTE — Progress Notes (Signed)
D5 W changed to NS.  CBG now 61, unable to take more than sips of po's. D50 25 cc given x 1

## 2018-06-17 NOTE — Progress Notes (Signed)
OT Cancellation Note  Patient Details Name: Dawn Haynes MRN: [id number] DOB: 10-Mar-1981   Cancelled Treatment:     Will await palliative meeting before OT eval. Thanks, Kari Baars, OT Acute Rehabilitation Services [phone] Office- [phone], Edwena Felty D 06/17/2018, 10:26 AM

## 2018-06-17 NOTE — Progress Notes (Signed)
  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 She reported continued back pain, increased fatigue, and some pain/burning in her throat.    Plan: The patient has completed radiation treatment. The patient will return to radiation oncology clinic for routine followup in one month. I advised the patient to call or return sooner if they have any questions or concerns related to their recovery or treatment. ________________________________  Jodelle Gross, M.D., Ph.D.   This document serves as a record of services personally performed by Kyung Rudd, MD. It was created on his behalf by Wilburn Mylar, a trained medical scribe. The creation of this record is based on the scribe's personal observations and the provider's statements to them. This document has been checked and approved by the attending provider.

## 2018-06-17 NOTE — Consult Note (Signed)
Consultation Note Date: 06/17/2018   Patient Name: Dawn Haynes  DOB: 1981-06-05  MRN: [id number]  Age / Sex: 37 y.o., female  PCP: [name], PA Referring Physician: Elmarie Shiley, MD  Reason for Consultation: Establishing goals of care and Terminal Care  HPI/Patient Profile: 37 y.o. female  with past medical history of metastatic breast cancer initially diagnosed in 2016 admitted on 06/15/2018 with weakness and pain. 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 Dr. [name] discussed poor prognosis with patient/husband and recommending comfort measures/hospice services. Palliative medicine consulted.   Clinical Assessment and Goals of Care:  I have reviewed medical records, discussed with care team, and met with patient and husband Dawn Haynes) at bedside to discuss diagnosis, prognosis, GOC, EOL wishes, disposition and options. [name]'s father comes to bedside at the end of my visit.   Introduced Palliative Medicine as specialized medical care for people living with serious illness. It focuses on providing relief from the symptoms and stress of a serious illness.  We discussed a brief life review of the patient. [name] and Dawn Haynes have been together for 14 years. She shares that she is a "homebody" and stayed home raising her children. She has 6 children, three still at home. She enjoys keeping her home clean and baking but shares that it has become harder for her to bake.   Discussed journey since diagnosis of cancer in 2016. [name] shares she was in the "clear" this past January but complications started this past September/October with spinal compression requiring surgery and radiation. Also worsening liver tumors.  She shares her sadness that she should have pursued chemotherapy for her liver before the radiation for her spine since this contributed to her becoming significantly weaker and with difficulty swallowing/poor nutritional status. Therapeutic listening and emotional support provided.   Discussed course of hospital diagnoses and interventions. Discussed her conversation with Dr. [name] yesterday, understanding that "chemotherapy will kill me faster" and recommendation to go home and spend time with her family. Further explained worsening progression of cancer with evidence of liver failure.  The natural disease trajectory and expectations at EOL were discussed.  The difference between aggressive medical intervention and comfort care was considered in light of the patients very poor prognosis. Introduced hospice options and philosophy, explaining goal of comfort, quality, and dignity at EOL. Discussed emphasis on symptom management to relieve pain and suffering. [name] wishes to return home with hospice and spend time with her family. Dawn Haynes confirms that they have supportive family that will assist in caring for her in the home. Discussed that hospice will provide medical equipment. Discussed comfort feeds. Also explained transition from home to hospice facility if necessary/symptom burden worsens. Patient/husband also understand that IVF and IV antibiotics will not be continued at home with hospice.   Discussed symptom management and possibility to add TD fentanyl. [name] tells me oxycodone was recently increased from 58  to 1m q3h. This provides her relief along with IV fentanyl inpatient. We discussed staying on top of oral prn's to better manage pain. Patient is not interested in starting TD fentanyl patch at this time and tells me her pain is controlled on current regimen. Denies nausea or vomiting. Tells me she is getting a suppository this afternoon.   Concepts specific to code status were addressed.  Educated on medical recommendation against heroic measures at EOL with progressive, terminal cancer and frailty.  Educated on hospice philosophy and allowing a natural and dignified death. Patient's father becomes very tearful. He shares that his brother had lung cancer and was resuscitated and survived another year after that event. He does share that the cancer continued to progress. [name] asks what would happen if we tried to bring her back. Explained code blue in detail. I explained that she will likely not survive that event and would cause more pain and suffering at the end of her life. Also would not change her cancer diagnosis and contribute to a meaningful quality of life. [name] agrees with NO resuscitation. [name] but also nods yes in agreement with her wishes.   Questions and concerns were addressed. PMT contact information given. Emotional/spiritual support provided.    SUMMARY OF RECOMMENDATIONS    Patient and husband understand progressive metastatic cancer and poor prognosis. Patient understands she can no longer tolerate further chemotherapy and wishes to go home. Husband confirms he can care for her at home along with family support.   Discussed hospice philosophy and options. Patient/husband agreeable with initiation of home hospice services.   Symptom management--see below.  Discussed code status with patient, husband, and father. Patient accepts DNR code status, understanding the poor chances of her surviving a resuscitative event with metastatic cancer and frailty. Code status changed to DNR. Durable DNR completed.   RN CM to arrange home with hospice.   Code Status/Advance Care Planning:  DNR  Symptom Management:   Roxicodone 139mPO q3h prn pain/dyspnea  Continue Fentanyl 50-7556mIV q1h prn pain/dyspnea (Patient not interested in starting fentanyl TD).   Miralax/Senna scheduled  Dulcolax 5mg49mily prn  Carafate TID  Fluconazole IVPB daily  Palliative  Prophylaxis:   Aspiration, Bowel Regimen, Delirium Protocol, Frequent Pain Assessment, Oral Care and Turn Reposition  Psycho-social/Spiritual:   Desire for further Chaplaincy support:yes  Additional Recommendations: Caregiving  Support/Resources, Compassionate Wean Education and Education on Hospice  Prognosis:   Poor prognosis likely weeks with progressive metastatic breast cancer, worsening liver functions, and declining functional/nutritional status.   Discharge Planning: Home with Hospice      Primary Diagnoses: Present on Admission: . Ascites . Abdominal pain . Metastasis to liver (HCC)BrawleyHyponatremia . Hyperkalemia . Transaminitis . Ascites, malignant . Hypotension . Hypoglycemia . Elevated lactic acid level . Dehydration   I have reviewed the medical record, interviewed the patient and family, and examined the patient. The following aspects are pertinent.  Past Medical History:  Diagnosis Date  . Breast cancer of upper-outer quadrant of left female breast (HCC)[place]/29/2016  . DDD (degenerative disc disease), lumbar    L1-2  . Genetic testing 01/12/2017   Ms. Neth underwent genetic counseling and testing for hereditary cancer syndromes on 01/05/2017. Her results were negative for mutations in all 46 genes analyzed by [employer]'s 46-gene Common Hereditary Cancers Panel. Genes analyzed include: APC, ATM, AXIN2, BARD1, BMPR1A, BRCA1, BRCA2, BRIP1, CDH1, CDKN2A, CHEK2, CTNNA1, DICER1, EPCAM, GREM1, HOXB13, KIT, MEN1, MLH1, MSH2, MSH3, MSH6, MUTYH,  NBN,   . Genetic testing of female    [facility] panel negative 01/2017  . History of blood transfusion    "when I lost alot of blood when I went into premature labor"; no abnormal reaction noted   Social History   Socioeconomic History  . Marital status: Legally Separated    Spouse name: Not on file  . Number of children: Not on file  . Years of education: Not on file  . Highest education level: Not on file  Occupational  History  . Not on file  Social Needs  . Financial resource strain: Not on file  . Food insecurity:    Worry: Not on file    Inability: Not on file  . Transportation needs:    Medical: No    Non-medical: No  Tobacco Use  . Smoking status: Never Smoker  . Smokeless tobacco: Never Used  Substance and Sexual Activity  . Alcohol use: Yes    Comment: 12/23/2016 "might have a drink a few times/year"  . Drug use: No  . Sexual activity: Yes    Birth control/protection: Condom  Lifestyle  . Physical activity:    Days per week: Not on file    Minutes per session: Not on file  . Stress: Not on file  Relationships  . Social connections:    Talks on phone: Not on file    Gets together: Not on file    Attends religious service: Not on file    Active member of club or organization: Not on file    Attends meetings of clubs or organizations: Not on file    Relationship status: Not on file  Other Topics Concern  . Not on file  Social History Narrative  . Not on file   Family History  Problem Relation Age of Onset  . Hypertension Mother   . Diabetes Maternal Grandmother    Scheduled Meds: . mouth rinse  15 mL Mouth Rinse BID  . polyethylene glycol  17 g Oral Daily  . senna-docusate  1 tablet Oral BID  . sodium phosphate  1 enema Rectal Once  . sucralfate  1 g Oral TID WC & HS  . thiamine injection  100 mg Intravenous Daily   Continuous Infusions: . sodium chloride Stopped (06/16/18 1244)  . cefTRIAXone (ROCEPHIN)  IV Stopped (06/17/18 1116)  . dextrose 5 % and 0.9% NaCl 50 mL/hr at 06/17/18 1236  . fluconazole (DIFLUCAN) IV Stopped (06/17/18 1007)   PRN Meds:.sodium chloride, acetaminophen **OR** acetaminophen, bisacodyl, docusate sodium, fentaNYL (SUBLIMAZE) injection, magic mouthwash, ondansetron **OR** ondansetron (ZOFRAN) IV, oxyCODONE, phenol Medications Prior to Admission:  Prior to Admission medications   Medication Sig Start Date End Date Taking? Authorizing Provider    ALPHA LIPOIC ACID PO Take 1 capsule by mouth daily.   Yes [provider]  docusate sodium (COLACE) 100 MG capsule Take 1 capsule (100 mg total) by mouth 2 (two) times daily. Patient taking differently: Take 100 mg by mouth 2 (two) times daily as needed for mild constipation.  04/30/18  Yes OstergardJoyice Faster, MD  lidocaine (XYLOCAINE) 2 % solution Use as directed 15 mLs in the mouth or throat every 4 (four) hours as needed (for esophagitis). 06/13/18  Yes Hayden Pedro, PA-C  Multiple Vitamin (MULTIVITAMIN) tablet Take 1 tablet by mouth daily.   Yes [provider]  oxyCODONE (OXY IR/ROXICODONE) 5 MG immediate release tablet Take 1 tablet (5 mg total) by mouth every 3 (three) hours as needed (  pain). Patient taking differently: Take 1-2 mg by mouth every 3 (three) hours as needed for severe pain (pain).  06/08/18  Yes Tyler Pita, MD  sucralfate (CARAFATE) 1 g tablet Take 1 tablet (1 g total) by mouth 4 (four) times daily -  with meals and at bedtime. 5 min before meals for radiation induced esophagitis Patient taking differently: Take 0.5-1 g by mouth 3 (three) times daily as needed (indigestion). 5 min before meals for radiation induced esophagitis 06/08/18  Yes Tyler Pita, MD  TURMERIC PO Take 1 capsule by mouth daily.   Yes [provider]  lidocaine-prilocaine (EMLA) cream Apply to affected area once Patient not taking: Reported on 06/15/2018 05/31/18   Nicholas Lose, MD  LORazepam (ATIVAN) 0.5 MG tablet Take 1 tablet (0.5 mg total) by mouth at bedtime as needed for sleep. Patient not taking: Reported on 06/15/2018 05/31/18   Nicholas Lose, MD  Magnesium 400 MG TABS Take 400 mg by mouth 2 (two) times daily. Patient not taking: Reported on 06/15/2018 05/31/18   Yaakov Guthrie, MD  metoprolol tartrate (LOPRESSOR) 25 MG tablet Take 1 tablet (25 mg total) by mouth 2 (two) times daily. Patient not taking: Reported on 06/15/2018 05/17/18   Nicholas Lose,  MD  ondansetron (ZOFRAN) 8 MG tablet Take 1 tablet (8 mg total) by mouth 2 (two) times daily as needed (Nausea or vomiting). Patient not taking: Reported on 06/15/2018 05/31/18   Nicholas Lose, MD  prochlorperazine (COMPAZINE) 10 MG tablet Take 1 tablet (10 mg total) by mouth every 6 (six) hours as needed (Nausea or vomiting). Patient not taking: Reported on 06/15/2018 05/31/18   Nicholas Lose, MD   No Known Allergies Review of Systems  Constitutional: Positive for activity change and appetite change.  Gastrointestinal: Positive for abdominal pain.  Neurological: Positive for weakness.   Physical Exam  Constitutional: She is oriented to person, place, and time. She is cooperative. She appears ill.  HENT:  Head: Normocephalic and atraumatic.  Cardiovascular: Regular rhythm.  Pulmonary/Chest: No accessory muscle usage. No tachypnea. No respiratory distress.  Abdominal: She exhibits distension. There is tenderness.  Neurological: She is alert and oriented to person, place, and time.  Skin: Skin is warm and dry.  Psychiatric: She has a normal mood and affect. Her speech is normal and behavior is normal. Cognition and memory are normal.  Nursing note and vitals reviewed.   Vital Signs: BP 134/78   Pulse (!) 101   Temp 97.7 F (36.5 C) (Oral)   Resp (!) 28   Ht _0  (1.6 m)   Wt 53.4 kg   SpO2 100%   BMI 20.85 kg/m  Pain Scale: 0-10   Pain Score: 3    SpO2: SpO2: 100 % O2 Device:SpO2: 100 % O2 Flow Rate: .   IO: Intake/output summary:   Intake/Output Summary (Last 24 hours) at 06/17/2018 1317 Last data filed at 06/17/2018 1235 Gross per 24 hour  Intake 1426.44 ml  Output 275 ml  Net 1151.44 ml    LBM:   Baseline Weight: Weight: 52.2 kg Most recent weight: Weight: 53.4 kg     Palliative Assessment/Data: PPS 30%   Flowsheet Rows     Most Recent Value  Intake Tab  Referral Department  Hospitalist  Unit at Time of Referral  ICU  Palliative Care Primary Diagnosis   Cancer  Date Notified  06/16/18  Palliative Care Type  New Palliative care  Reason for referral  Clarify Goals of Care  Date of  Admission  06/15/18  Date first seen by Palliative Care  06/17/18  # of days IP prior to Palliative referral  1  Clinical Assessment  Palliative Performance Scale Score  30%  Psychosocial & Spiritual Assessment  Palliative Care Outcomes  Patient/Family meeting held?  Yes  Who was at the meeting?  patient, husband, father  Palliative Care Outcomes  Clarified goals of care, Provided end of life care assistance, Provided psychosocial or spiritual support, ACP counseling assistance, Changed CPR status, Transitioned to hospice, Improved pain interventions, Improved non-pain symptom therapy, Counseled regarding hospice      Time In: 1100 Time Out: 1210 Time Total: 70 Greater than 50%  of this time was spent counseling and coordinating care related to the above assessment and plan.  Signed by:  Ihor Dow, FNP-C Palliative Medicine Team  Phone: [phone] Fax: [phone]   Please contact Palliative Medicine Team phone at [phone] for questions and concerns.  For individual provider: See Shea Evans

## 2018-06-17 NOTE — Care Management Note (Addendum)
Case Management Note  Patient Details  Name: Dawn Haynes MRN: [id number] Date of Birth: 07/04/1981  Subjective/Objective:                  Discharge planning  Action/Plan: Discussed the various hospice and palliative care agencies in the area.  Would like to use  [facility] [employer] information given Expected Discharge Date:  (unknown)               Expected Discharge Plan:  Home w Hospice Care  In-House Referral:     Discharge planning Services  CM Consult  Post Acute Care Choice:    Choice offered to:     DME Arranged:    DME Agency:     HH Arranged:    HH Agency:     Status of Service:  In process, will continue to follow  If discussed at Long Length of Stay Meetings, dates discussed:    Additional Comments:  Leeroy Cha, RN 06/17/2018, 12:23 PM

## 2018-06-17 NOTE — Progress Notes (Signed)
PROGRESS NOTE    Dawn Haynes  DGL:[id number] DOB: February 27, 1981 DOA: 06/15/2018 PCP: [name], PA    Brief Narrative; 40 very pleasant female with medical history significant for metastatic breast cancer, status post thoracic tumor removal, epicardial effusion is status post pericardial window November 2019 who presented to the emergency department with generalized weakness.  The patient has been having worsening left lateral abdominal pain.  No nausea no vomiting.  She has chronic abdominal pain due to hepatomegaly.  She has been more sleepy, she has not been able to eat due to pain when she is swallow.  Patient was noted to have hypoglycemia, hyperkalemia, and worsening increased liver function test.  Assessment & Plan:   Active Problems:   Ascites   Abdominal pain   Metastasis to liver (HCC)   Hyponatremia   Hyperkalemia   Transaminitis   Ascites, malignant   Hypotension   Hypoglycemia   Elevated lactic acid level   Malnutrition of moderate degree   Dehydration  1-Abdominal pain: Transaminases; likely related to underlying malignancy, hepatomegaly. Ultrasound was negative for any evidence of bile duct obstruction. KUB negative for obstruction. Continue with pain management. Bowel regimen.  2-Hyponatremia; Continue with IV fluid. Stable.  Metastatic  breast cancer, to liver Dr [name], saw patient in consultation.  He is recommending hospice care. Patient to be discharged with hospice care  Hyperkalemia; resolved  Ascites malignant; Small amount of ascites on  ultrasound. Will give one time dose of lasix.   Hypertension setting of dehydration.  Continue IV fluid  Hypoglycemia continue with D5. Dysphagia; concern with esophagitis.  Carafate.  Fluconazole to treat for Candida esophagitis.  She noticed some improvement.  Patient continue to have  poor oral intake.  Continue with IV fluids.  Elevated lactic acidosis;, leukocytosis. Continue with ceftriaxone.   She is complaining of cough I suspect lactic acidosis is related to liver failure.  RN Pressure Injury Documentation:    Malnutrition Type:  Nutrition Problem: Moderate Malnutrition Etiology: chronic illness, catabolic illness, cancer and cancer related treatments   Malnutrition Characteristics:  Signs/Symptoms: moderate fat depletion, moderate muscle depletion   Nutrition Interventions:  Interventions: Refer to RD note for recommendations, Liberalize Diet  Estimated body mass index is 20.85 kg/m as calculated from the following:   Height as of this encounter: 5\' 3"  (1.6 m).   Weight as of this encounter: 53.4 kg.   DVT prophylaxis: SCDs Code Status: Full code Family Communication: Care discussed with husband who was at bedside Disposition Plan: Remain in the hospital for IV fluids, pain management.  Consultants:   Palliative care  Dr. Sonny Dandy   Procedures:  Ultrasound; 1. 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 2. Small ascites   Antimicrobials:   Ceftriaxone 12 10/15/2017   Subjective: She reports improvement of productive cough. She report some improvement in her abdominal pain. Still not eating enough , she has been sipping on fluids.    Objective: Vitals:   06/17/18 0200 06/17/18 0346 06/17/18 0420 06/17/18 0800  BP: 114/66  138/82 107/68  Pulse: (!) 105  (!) 109 (!) 104  Resp: 15  19 17   Temp:  98 F (36.7 C)  97.7 F (36.5 C)  TempSrc:  Oral  Oral  SpO2: 100%  100% 100%  Weight:      Height:        Intake/Output Summary (Last 24 hours) at 06/17/2018 0917 Last data filed at 06/17/2018 0535 Gross per 24 hour  Intake 1224.67 ml  Output 275 ml  Net 949.67 ml   Filed Weights   06/15/18 1649 06/16/18 0834  Weight: 52.2 kg 53.4 kg    Examination:  General exam: Thin appearing Respiratory system: Crackles at the bases Cardiovascular system: S1-S2 tachycardic Gastrointestinal  system: Distended, hepatomegaly, mild tender Central nervous system: Alert and oriented Extremities: Symmetric power Skin: No rashes   Data Reviewed: I have personally reviewed following labs and imaging studies  CBC: Recent Labs  Lab 06/15/18 1707 06/16/18 0502 06/17/18 0311  WBC 16.0* 15.1* 19.8*  NEUTROABS 13.6*  --   --   HGB 10.6* 9.4* 9.4*  HCT 34.5* 30.8* 31.1*  MCV 81.8 82.8 84.3  PLT 231 181 157*   Basic Metabolic Panel: Recent Labs  Lab 06/15/18 1707 06/15/18 2248 06/16/18 0502 06/17/18 0311  NA 129* 131* 132* 129*  K 5.6* 5.0 4.9 4.9  CL 86* 92* 92* 93*  CO2 26 23 24 22   GLUCOSE 68* 87 84 77  BUN 32* 26* 22* 16  CREATININE 0.44 0.48 0.43* 0.34*  CALCIUM 9.6 8.4* 8.6* 8.7*  MG  --  2.8* 2.7*  --   PHOS  --  3.4 2.9  --    GFR: Estimated Creatinine Clearance: 78.9 mL/min (A) (by C-G formula based on SCr of 0.34 mg/dL (L)). Liver Function Tests: Recent Labs  Lab 06/15/18 1707 06/16/18 0502  AST 674* 585*  ALT 191* 162*  ALKPHOS 768* 684*  BILITOT 6.6* 6.3*  PROT 8.2* 7.2  ALBUMIN 2.6* 2.1*   Recent Labs  Lab 06/15/18 1707  LIPASE 35   Recent Labs  Lab 06/15/18 2248  AMMONIA 25   Coagulation Profile: Recent Labs  Lab 06/15/18 2248  INR 1.68   Cardiac Enzymes: No results for input(s): CKTOTAL, CKMB, CKMBINDEX, TROPONINI in the last 168 hours. BNP (last 3 results) No results for input(s): PROBNP in the last 8760 hours. HbA1C: No results for input(s): HGBA1C in the last 72 hours. CBG: Recent Labs  Lab 06/17/18 0031 06/17/18 0230 06/17/18 0432 06/17/18 0630 06/17/18 0747  GLUCAP 83 76 83 71 76   Lipid Profile: No results for input(s): CHOL, HDL, LDLCALC, TRIG, CHOLHDL, LDLDIRECT in the last 72 hours. Thyroid Function Tests: Recent Labs    06/16/18 0502  TSH 1.844   Anemia Panel: No results for input(s): VITAMINB12, FOLATE, FERRITIN, TIBC, IRON, RETICCTPCT in the last 72 hours. Sepsis Labs: Recent Labs  Lab  06/16/18 0131 06/16/18 0502 06/16/18 1728  PROCALCITON 15.50  --   --   LATICACIDVEN 6.2* 5.4* 5.7*    Recent Results (from the past 240 hour(s))  Culture, blood (x 2)     Status: None (Preliminary result)   Collection Time: 06/16/18  1:22 AM  Result Value Ref Range Status   Specimen Description   Final    BLOOD RIGHT UPPER ARM Performed at [facility], [place] [address], [place], [place] [address]    Special Requests   Final    BOTTLES DRAWN AEROBIC AND ANAEROBIC Blood Culture adequate volume Performed at [facility] [address], [place], [place] [address]    Culture   Final    NO GROWTH 1 DAY Performed at [facility], [place] [address] [place], [place] [address]    Report Status PENDING  Incomplete  MRSA PCR Screening     Status: None   Collection Time: 06/16/18  8:36 AM  Result Value Ref Range Status   MRSA by  PCR NEGATIVE NEGATIVE Final    Comment:        The GeneXpert MRSA Assay (FDA approved for NASAL specimens only), is one component of a comprehensive MRSA colonization surveillance program. It is not intended to diagnose MRSA infection nor to guide or monitor treatment for MRSA infections. Performed at [facility], [place] [address], [place], [place] [address]   Culture, blood (x 2)     Status: None (Preliminary result)   Collection Time: 06/16/18  8:54 AM  Result Value Ref Range Status   Specimen Description   Final    BLOOD RIGHT HAND Performed at [facility] [address], [place], [place] [address]    Special Requests   Final    BOTTLES DRAWN AEROBIC ONLY Blood Culture results may not be optimal due to an inadequate volume of blood received in culture bottles Performed at [facility] [address], [place], [place] [address]    Culture   Final    NO GROWTH < 24 HOURS Performed at [facility] [address] [place], [place] [address]    Report  Status PENDING  Incomplete         Radiology Studies: Dg Chest 2 View  Result Date: 06/15/2018 CLINICAL DATA:  Weakness. EXAM: CHEST - 2 VIEW COMPARISON:  Multiple chest x-rays since May 23, 2018 FINDINGS: Haziness over the left base is consistent with overlapping soft tissues. The heart, hila, mediastinum, lungs, and pleura are otherwise unchanged with no acute interval change. IMPRESSION: No acute interval change. Electronically Signed   By: Dorise Bullion III M.D   On: 06/15/2018 18:41   Dg Abd 1 View  Result Date: 06/15/2018 CLINICAL DATA:  Obstipation EXAM: ABDOMEN - 1 VIEW COMPARISON:  05/13/2018 FINDINGS: Prior cholecystectomy. Nonobstructive bowel gas pattern. No free air. Probable enlarged liver, likely related to known metastatic liver disease. Possible ascites in the abdomen with generalized loss of organ margins. No acute bony abnormality. IMPRESSION: Prior cholecystectomy.  No evidence of bowel obstruction. Probable hepatomegaly.  Suspect ascites. Electronically Signed   By: Rolm Baptise M.D.   On: 06/15/2018 23:31   US Abdomen Complete  Result Date: 06/15/2018 CLINICAL DATA:  37 year old female with history of metastatic breast cancer presenting with elevated LFTs. EXAM: ABDOMEN ULTRASOUND COMPLETE COMPARISON:  CT of the abdomen pelvis dated 05/23/2018 FINDINGS: Gallbladder: Cholecystectomy. Common bile duct: Diameter: 2 mm. Liver: The liver is heterogeneous with innumerable hypoechoic masses essentially replacing the liver parenchyma consistent with metastatic disease. 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 Portal vein is patent on color Doppler imaging with normal direction of blood flow towards the liver. IVC: No abnormality visualized. Pancreas: Visualized portion unremarkable. Spleen: Size and appearance within normal limits. Right Kidney: Length: 10.2 cm. Normal echogenicity. No hydronephrosis or shadowing stone. Left Kidney: Length: 11.4  cm. Normal echogenicity. No hydronephrosis or shadowing stone. Abdominal aorta: No aneurysm visualized. Other findings: There is a small amount of ascites. IMPRESSION: 1. 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 2. Small ascites. Electronically Signed   By: Anner Crete M.D.   On: 06/15/2018 22:20        Scheduled Meds: . mouth rinse  15 mL Mouth Rinse BID  . multivitamin with minerals  1 tablet Oral Daily  . polyethylene glycol  17 g Oral Daily  . senna-docusate  1 tablet Oral BID  . sodium phosphate  1 enema Rectal  Once  . sucralfate  1 g Oral TID WC & HS  . thiamine injection  100 mg Intravenous Daily   Continuous Infusions: . sodium chloride Stopped (06/16/18 1244)  . sodium chloride    . cefTRIAXone (ROCEPHIN)  IV Stopped (06/16/18 1044)  . fluconazole (DIFLUCAN) IV 100 mg (06/17/18 0907)     LOS: 1 day    Time spent: 35 minutes     Elmarie Shiley, MD [facility] Pager [phone]  If 7PM-7AM, please contact night-coverage [email or web address] Password [email or web address] 06/17/2018, 9:17 AM

## 2018-06-18 DIAGNOSIS — G893 Neoplasm related pain (acute) (chronic): Principal | ICD-10-CM

## 2018-06-18 LAB — CBC
HCT: 30.7 % — ABNORMAL LOW (ref 36.0–46.0)
Hemoglobin: 9.3 g/dL — ABNORMAL LOW (ref 12.0–15.0)
MCH: 25.2 pg — AB (ref 26.0–34.0)
MCHC: 30.3 g/dL (ref 30.0–36.0)
MCV: 83.2 fL (ref 80.0–100.0)
PLATELETS: 119 10*3/uL — AB (ref 150–400)
RBC: 3.69 MIL/uL — AB (ref 3.87–5.11)
RDW: 18.5 % — ABNORMAL HIGH (ref 11.5–15.5)
WBC: 22.1 10*3/uL — ABNORMAL HIGH (ref 4.0–10.5)
nRBC: 0.2 % (ref 0.0–0.2)

## 2018-06-18 LAB — BASIC METABOLIC PANEL
Anion gap: 17 — ABNORMAL HIGH (ref 5–15)
BUN: 17 mg/dL (ref 6–20)
CO2: 21 mmol/L — ABNORMAL LOW (ref 22–32)
Calcium: 9 mg/dL (ref 8.9–10.3)
Chloride: 94 mmol/L — ABNORMAL LOW (ref 98–111)
Creatinine, Ser: 0.39 mg/dL — ABNORMAL LOW (ref 0.44–1.00)
GFR calc Af Amer: >60 mL/min (ref >60)
GFR calc non Af Amer: >60 mL/min (ref >60)
GLUCOSE: 74 mg/dL (ref 70–99)
Potassium: 4.3 mmol/L (ref 3.5–5.1)
Sodium: 132 mmol/L — ABNORMAL LOW (ref 135–145)

## 2018-06-18 MED ORDER — POLYETHYLENE GLYCOL 3350 17 G PO PACK
17.0000 g | PACK | Freq: Two times a day (BID) | ORAL | Status: DC
  Administered 2018-06-18 – 2018-06-19 (×2): 17 g via ORAL
  Filled 2018-06-18 (×2): qty 1

## 2018-06-18 MED ORDER — DOCUSATE SODIUM 100 MG PO CAPS
100.0000 mg | ORAL_CAPSULE | Freq: Two times a day (BID) | ORAL | Status: DC
  Administered 2018-06-19: 100 mg via ORAL
  Filled 2018-06-18 (×2): qty 1

## 2018-06-18 MED ORDER — BISACODYL 10 MG RE SUPP
10.0000 mg | Freq: Once | RECTAL | Status: AC
  Administered 2018-06-18: 10 mg via RECTAL
  Filled 2018-06-18: qty 1

## 2018-06-18 NOTE — Progress Notes (Signed)
PROGRESS NOTE    Dawn Haynes  ZOX:[id number] DOB: [birth year] DOA: 06/15/2018 PCP: [name], PA    Brief Narrative; 83 very pleasant female with medical history significant for metastatic breast cancer, status post thoracic tumor removal, epicardial effusion is status post pericardial window November 2019 who presented to the emergency department with generalized weakness.  The patient has been having worsening left lateral abdominal pain.  No nausea no vomiting.  She has chronic abdominal pain due to hepatomegaly.  She has been more sleepy, she has not been able to eat due to pain when she is swallow.  Patient was noted to have hypoglycemia, hyperkalemia, and worsening increased liver function test.  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 Ultrasound was negative for any evidence of bile duct obstruction. KUB negative for obstruction. Continue with pain management. Bowel regimen. No significant bowel movement, will try suppository today.  2-Hyponatremia; Continue with IV fluid. Improve  Metastatic  breast cancer, to liver Dr [name], saw patient in consultation.  He is recommending hospice care. Patient to be discharged with hospice care  Hyperkalemia; resolved  Ascites malignant; Small amount of ascites on  ultrasound. Will give one time dose of lasix.   Hypotension setting of dehydration.  Continue IV fluid  Hypoglycemia continue with D5.  Dysphagia; concern with esophagitis.  Carafate.  Fluconazole to treat for Candida esophagitis.  She  noticed some improvement.  Continue with IV fluids. Sipping on fluids.  Elevated lactic acidosis;, leukocytosis. Continue with ceftriaxone.  She is complaining of cough.  I suspect lactic acidosis is related to liver failure. Wbc continue to increases.    RN Pressure Injury Documentation:    Malnutrition Type:  Nutrition Problem: Moderate Malnutrition Etiology: chronic illness, catabolic illness, cancer and cancer related treatments   Malnutrition Characteristics:  Signs/Symptoms: moderate fat depletion, moderate muscle depletion   Nutrition Interventions:  Interventions: Refer to RD note for recommendations, Liberalize Diet  Estimated body mass index is 21.48 kg/m as calculated from the following:   Height as of this encounter: 5\' 3"  (1.6 m).   Weight as of this encounter: 55 kg.   DVT prophylaxis: SCDs Code Status: Full code Family Communication: Care discussed with husband who was at bedside Disposition Plan: Remain in the hospital for IV fluids, pain management.  Consultants:   Palliative care  Dr. Sonny Dandy   Procedures:  Ultrasound; 1. 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 2. Small ascites   Antimicrobials:   Ceftriaxone 12 10/15/2017   Subjective: She is feeling better regarding her swallowing function.  She is still not eating a lot.  She is sleeping a little bit of fluids.  She has not had a good bowel movement yet.    Objective: Vitals:   06/17/18 1752 06/17/18 2027 06/18/18 0500 06/18/18 1324  BP: (!) 154/77 109/81 112/77 116/76  Pulse: (!) 110 (!) 114 (!) 116 (!) 108  Resp: (!) 33 16 16 16   Temp:  98.8 F (37.1 C) 98 F (36.7 C) 97.9 F (36.6  C)  TempSrc:  Oral Oral Oral  SpO2: 98% 99% 99% 100%  Weight:  55 kg    Height:  5\' 3"  (1.6 m)      Intake/Output Summary (Last 24 hours) at 06/18/2018 1755 Last data filed at 06/18/2018 0948 Gross per 24 hour  Intake 240 ml    Output -  Net 240 ml   Filed Weights   06/15/18 1649 06/16/18 0834 06/17/18 2027  Weight: 52.2 kg 53.4 kg 55 kg    Examination:  General exam: Thin appearing Respiratory system: Clear to auscultation Cardiovascular system: 1, S2 tachycardic Gastrointestinal system: Distended, hepatomegaly, tenderness. Central nervous system: Alert Extremities: Metric power Skin: No rashes   Data Reviewed: I have personally reviewed following labs and imaging studies  CBC: Recent Labs  Lab 06/15/18 1707 06/16/18 0502 06/17/18 0311 06/18/18 0519  WBC 16.0* 15.1* 19.8* 22.1*  NEUTROABS 13.6*  --   --   --   HGB 10.6* 9.4* 9.4* 9.3*  HCT 34.5* 30.8* 31.1* 30.7*  MCV 81.8 82.8 84.3 83.2  PLT 231 181 125* 326*   Basic Metabolic Panel: Recent Labs  Lab 06/15/18 1707 06/15/18 2248 06/16/18 0502 06/17/18 0311 06/18/18 0519  NA 129* 131* 132* 129* 132*  K 5.6* 5.0 4.9 4.9 4.3  CL 86* 92* 92* 93* 94*  CO2 26 23 24 22  21*  GLUCOSE 68* 87 84 77 74  BUN 32* 26* 22* 16 17  CREATININE 0.44 0.48 0.43* 0.34* 0.39*  CALCIUM 9.6 8.4* 8.6* 8.7* 9.0  MG  --  2.8* 2.7*  --   --   PHOS  --  3.4 2.9  --   --    GFR: Estimated Creatinine Clearance: 78.9 mL/min (A) (by C-G formula based on SCr of 0.39 mg/dL (L)). Liver Function Tests: Recent Labs  Lab 06/15/18 1707 06/16/18 0502  AST 674* 585*  ALT 191* 162*  ALKPHOS 768* 684*  BILITOT 6.6* 6.3*  PROT 8.2* 7.2  ALBUMIN 2.6* 2.1*   Recent Labs  Lab 06/15/18 1707  LIPASE 35   Recent Labs  Lab 06/15/18 2248  AMMONIA 25   Coagulation Profile: Recent Labs  Lab 06/15/18 2248  INR 1.68   Cardiac Enzymes: No results for input(s): CKTOTAL, CKMB, CKMBINDEX, TROPONINI in the last 168 hours. BNP (last 3 results) No results for input(s): PROBNP in the last 8760 hours. HbA1C: No results for input(s): HGBA1C in the last 72 hours. CBG: Recent Labs  Lab 06/17/18 1003 06/17/18 1152 06/17/18 1236 06/17/18 1415 06/17/18 1604   GLUCAP 85 61* 121* 78 80   Lipid Profile: No results for input(s): CHOL, HDL, LDLCALC, TRIG, CHOLHDL, LDLDIRECT in the last 72 hours. Thyroid Function Tests: Recent Labs    06/16/18 0502  TSH 1.844   Anemia Panel: No results for input(s): VITAMINB12, FOLATE, FERRITIN, TIBC, IRON, RETICCTPCT in the last 72 hours. Sepsis Labs: Recent Labs  Lab 06/16/18 0131 06/16/18 0502 06/16/18 1728  PROCALCITON 15.50  --   --   LATICACIDVEN 6.2* 5.4* 5.7*    Recent Results (from the past 240 hour(s))  Culture, blood (x 2)     Status: None (Preliminary result)   Collection Time: 06/16/18  1:22 AM  Result Value Ref Range Status   Specimen Description   Final    BLOOD RIGHT UPPER ARM Performed at [facility], [place] [address], [place], [place] [address]    Special Requests   Final    BOTTLES DRAWN AEROBIC AND ANAEROBIC Blood Culture  adequate volume Performed at [facility] [address] [place], [place] [address]    Culture   Final    NO GROWTH 2 DAYS Performed at [facility] [address] [place], [place] [address]    Report Status PENDING  Incomplete  MRSA PCR Screening     Status: None   Collection Time: 06/16/18  8:36 AM  Result Value Ref Range Status   MRSA by PCR NEGATIVE NEGATIVE Final    Comment:        The GeneXpert MRSA Assay (FDA approved for NASAL specimens only), is one component of a comprehensive MRSA colonization surveillance program. It is not intended to diagnose MRSA infection nor to guide or monitor treatment for MRSA infections. Performed at [facility], [place] [address], [place], [place] [address]   Culture, blood (x 2)     Status: None (Preliminary result)   Collection Time: 06/16/18  8:54 AM  Result Value Ref Range Status   Specimen Description   Final    BLOOD RIGHT HAND Performed at [facility] [address] [place], [place] [address]    Special Requests   Final     BOTTLES DRAWN AEROBIC ONLY Blood Culture results may not be optimal due to an inadequate volume of blood received in culture bottles Performed at [facility] [address], [place], [place] [address]    Culture   Final    NO GROWTH 2 DAYS Performed at [facility] [address] [place], [place] [address]    Report Status PENDING  Incomplete         Radiology Studies: No results found.      Scheduled Meds: . mouth rinse  15 mL Mouth Rinse BID  . polyethylene glycol  17 g Oral Daily  . senna-docusate  1 tablet Oral BID  . sodium phosphate  1 enema Rectal Once  . sucralfate  1 g Oral TID WC & HS  . thiamine injection  100 mg Intravenous Daily   Continuous Infusions: . sodium chloride Stopped (06/16/18 1244)  . cefTRIAXone (ROCEPHIN)  IV 2 g (06/18/18 1130)  . dextrose 5 % and 0.9% NaCl 45 mL/hr at 06/18/18 1348  . fluconazole (DIFLUCAN) IV 100 mg (06/18/18 1020)     LOS: 2 days    Time spent: 35 minutes     Elmarie Shiley, MD [facility] Pager [phone]  If 7PM-7AM, please contact night-coverage [email or web address] Password [email or web address] 06/18/2018, 5:55 PM

## 2018-06-18 NOTE — Progress Notes (Signed)
Daily Progress Note   Patient Name: Dawn Haynes       Date: 06/18/2018 DOB: Feb 15, 1981  Age: 37 y.o. MRN#: [id number] Attending Physician: Elmarie Shiley, MD Primary Care Physician: [name], [place] Admit Date: 06/15/2018  Reason for Consultation/Follow-up: Establishing goals of care and Pain control  Subjective: I saw and examined Ms. Savidge briefly this evening.  She had multiple family at bedside and declined to discuss other than reporting that her pain has been well controlled on current medication.  She is agreeable to me following up tomorrow morning to discuss pain regimen further.  Length of Stay: 2  Current Medications: Scheduled Meds:  . mouth rinse  15 mL Mouth Rinse BID  . polyethylene glycol  17 g Oral Daily  . senna-docusate  1 tablet Oral BID  . sodium phosphate  1 enema Rectal Once  . sucralfate  1 g Oral TID WC & HS  . thiamine injection  100 mg Intravenous Daily    Continuous Infusions: . sodium chloride Stopped (06/16/18 1244)  . cefTRIAXone (ROCEPHIN)  IV 2 g (06/18/18 1130)  . dextrose 5 % and 0.9% NaCl 45 mL/hr at 06/18/18 1348  . fluconazole (DIFLUCAN) IV 100 mg (06/18/18 1020)    PRN Meds: sodium chloride, acetaminophen **OR** acetaminophen, bisacodyl, docusate sodium, fentaNYL (SUBLIMAZE) injection, magic mouthwash, ondansetron **OR** ondansetron (ZOFRAN) IV, oxyCODONE, phenol  Physical Exam         General: Alert, awake, in no acute distress. Chronically ill appearing Heart: Regular rate Lungs: Good air movement, clear Abdomen: + distended Ext: No significant edema Skin: Warm and dry  Vital Signs: BP 116/76 (BP Location: Right Leg)   Pulse (!) 108   Temp 97.9 F (36.6 C) (Oral)   Resp 16   Ht 5\' 3"  (1.6 m)    Wt 55 kg   SpO2 100%   BMI 21.48 kg/m  SpO2: SpO2: 100 % O2 Device: O2 Device: Room Air O2 Flow Rate:    Intake/output summary:   Intake/Output Summary (Last 24 hours) at 06/18/2018 1723 Last data filed at 06/18/2018 0948 Gross per 24 hour  Intake 478.95 ml  Output -  Net 478.95 ml   LBM:   Baseline Weight: Weight:  52.2 kg Most recent weight: Weight: 55 kg       Palliative Assessment/Data:    Flowsheet Rows     Most Recent Value  Intake Tab  Referral Department  Hospitalist  Unit at Time of Referral  ICU  Palliative Care Primary Diagnosis  Cancer  Date Notified  06/16/18  Palliative Care Type  New Palliative care  Reason for referral  Clarify Goals of Care  Date of Admission  06/15/18  Date first seen by Palliative Care  06/17/18  # of days IP prior to Palliative referral  1  Clinical Assessment  Palliative Performance Scale Score  30%  Psychosocial & Spiritual Assessment  Palliative Care Outcomes  Patient/Family meeting held?  Yes  Who was at the meeting?  patient, husband, father  Palliative Care Outcomes  Clarified goals of care, Provided end of life care assistance, Provided psychosocial or spiritual support, ACP counseling assistance, Changed CPR status, Transitioned to hospice, Improved pain interventions, Improved non-pain symptom therapy, Counseled regarding hospice      Patient Active Problem List   Diagnosis Date Noted  . Palliative care by specialist   . Liver dysfunction   . Malnutrition of moderate degree 06/16/2018  . Dehydration 06/16/2018  . Hypotension 06/15/2018  . Hypoglycemia 06/15/2018  . Elevated lactic acid level 06/15/2018  . Bone metastasis ([place]) 06/04/2018  . Terminal care 05/31/2018  . Chest pain   . Pericardial effusion 05/23/2018  . Hypokalemia 05/23/2018  . Pathologic fracture of thoracic vertebrae 04/28/2018  . 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 Genetic testing 01/12/2017  .  Metastatic breast cancer ([place]) 12/24/2016  . Acquired absence of breast and absent nipple, left 12/23/2016  . Hyponatremia 07/29/2015  . Hyperkalemia 07/29/2015  . Transaminitis 07/29/2015  . Ascites, malignant   . Malignant ascites   . Port catheter in place   . Metastasis to liver ([place]) 07/24/2015  . Ascites 07/17/2015  . SBP (spontaneous bacterial peritonitis) ([place]) 07/17/2015  . Abdominal pain 07/17/2015  . SOB (shortness of breath) 07/17/2015  . DDD (degenerative disc disease), lumbar 07/17/2015  . Severe sepsis ([place])   . Breast cancer of upper-outer quadrant of left female breast ([place]) 07/11/2015    Palliative Care Assessment & Plan   Patient Profile: 37 y.o. female  with past medical history of metastatic breast cancer initially diagnosed in 2016 admitted on 06/15/2018 with weakness and pain. 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 Dr. [name] discussed poor prognosis with patient/husband and recommending comfort measures/hospice services.  Plan for transition home with hospice services.  Recommendations/Plan:  Pain: Has used rescue fentanyl (59mcg dose) x 8 in the last 24 hours.  She also has oral oxycodone 10mg  ordered but has not utilized this.  Will discuss trial of oral medications in order to ensure it is effective for discharge.  She may need increased dose (15mg  oxycodone would be closer to dose of fentanyl that she has been getting).  She has declined trial of fentanyl patch.  Code Status:    Code Status Orders  (From admission, onward)         Start     Ordered   06/17/18 1133  Do not attempt resuscitation (DNR)  Continuous    Question Answer Comment  In the event of cardiac or respiratory ARREST Do not call a "code  blue"   In the event of cardiac or respiratory  ARREST Do not perform Intubation, CPR, defibrillation or ACLS   In the event of cardiac or respiratory ARREST Use medication by any route, position, wound care, and other measures to relive pain and suffering. May use oxygen, suction and manual treatment of airway obstruction as needed for comfort.      06/17/18 1132        Code Status History    Date Active Date Inactive Code Status Order ID Comments User Context   06/16/2018 0122 06/17/2018 1132 Full Code [id number]  Toy Baker, MD ED   05/27/2018 1117 05/31/2018 1408 Full Code [id number]  Ivin Poot, MD Inpatient   05/24/2018 0142 05/27/2018 1117 Full Code [id number]  Toy Baker, MD Inpatient   04/27/2018 2218 04/30/2018 2108 Full Code [id number]  Newman Pies, MD ED   12/23/2016 1130 12/23/2016 1250 Full Code [id number]  Wallace Going, DO Inpatient   07/24/2015 1719 07/29/2015 2350 Full Code [id number]  [name], MD Inpatient   07/18/2015 0401 07/24/2015 1719 Full Code [id number]  Theressa Millard, MD Inpatient       Prognosis:   < 6 months  Discharge Planning:  Home with Hospice  Care plan was discussed with RN  Thank you for allowing the Palliative Medicine Team to assist in the care of this patient.   Total Time 20 Prolonged Time Billed No      Greater than 50%  of this time was spent counseling and coordinating care related to the above assessment and plan.  Micheline Rough, MD  Please contact Palliative Medicine Team phone at [phone] for questions and concerns.

## 2018-06-19 LAB — CBC
HCT: 33.2 % — ABNORMAL LOW (ref 36.0–46.0)
Hemoglobin: 9.9 g/dL — ABNORMAL LOW (ref 12.0–15.0)
MCH: 25.3 pg — ABNORMAL LOW (ref 26.0–34.0)
MCHC: 29.8 g/dL — ABNORMAL LOW (ref 30.0–36.0)
MCV: 84.9 fL (ref 80.0–100.0)
PLATELETS: 134 10*3/uL — AB (ref 150–400)
RBC: 3.91 MIL/uL (ref 3.87–5.11)
RDW: 19.2 % — AB (ref 11.5–15.5)
WBC: 22.5 10*3/uL — ABNORMAL HIGH (ref 4.0–10.5)
nRBC: 0.2 % (ref 0.0–0.2)

## 2018-06-19 MED ORDER — ONDANSETRON HCL 8 MG PO TABS: 8.0000 mg | ORAL_TABLET | Freq: Two times a day (BID) | ORAL | 1 refills | Status: AC | PRN

## 2018-06-19 MED ORDER — SUCRALFATE 1 GM/10ML PO SUSP: 1.0000 g | Freq: Three times a day (TID) | ORAL | 0 refills | Status: AC

## 2018-06-19 MED ORDER — SENNOSIDES-DOCUSATE SODIUM 8.6-50 MG PO TABS: 1.0000 | ORAL_TABLET | Freq: Two times a day (BID) | ORAL | 0 refills | Status: AC

## 2018-06-19 MED ORDER — OXYCODONE HCL 5 MG/5ML PO SOLN: 10.0000 mg | ORAL | 0 refills | Status: AC | PRN

## 2018-06-19 MED ORDER — MAGIC MOUTHWASH: 5.0000 mL | Freq: Three times a day (TID) | ORAL | 0 refills | Status: AC | PRN

## 2018-06-19 MED ORDER — POLYETHYLENE GLYCOL 3350 17 G PO PACK: 17.0000 g | PACK | Freq: Two times a day (BID) | ORAL | 0 refills | Status: AC

## 2018-06-19 MED ORDER — FLUCONAZOLE 100 MG PO TABS: 100.0000 mg | ORAL_TABLET | Freq: Every day | ORAL | 0 refills | Status: AC

## 2018-06-19 MED ORDER — PROCHLORPERAZINE MALEATE 10 MG PO TABS: 10.0000 mg | ORAL_TABLET | Freq: Four times a day (QID) | ORAL | 1 refills | Status: AC | PRN

## 2018-06-19 MED ORDER — AMOXICILLIN-POT CLAVULANATE 125-31.25 MG/5ML PO SUSR: 125.0000 mg | Freq: Two times a day (BID) | ORAL | 0 refills | Status: AC

## 2018-06-19 MED ORDER — MAGNESIUM 400 MG PO TABS: 400.0000 mg | ORAL_TABLET | Freq: Two times a day (BID) | ORAL | 0 refills | Status: AC

## 2018-06-19 MED ORDER — BISACODYL 5 MG PO TBEC: 5.0000 mg | DELAYED_RELEASE_TABLET | Freq: Every day | ORAL | 0 refills | Status: AC | PRN

## 2018-06-19 NOTE — Discharge Summary (Signed)
Physician Discharge Summary  Dawn Haynes UTM:[id number] DOB: Aug 18, 1980 DOA: 06/15/2018  PCP: [name], PA  Admit date: 06/15/2018 Discharge date: 06/19/2018  Admitted From: Home  Disposition:  Home with hospice.   Recommendations for Outpatient Follow-up:  1. Follow up with PCP in 1-2 weeks 2. Please obtain BMP/CBC in one week 3. Discharge home with Hospice.     Discharge Condition: Stable. Poor prognosis.  CODE STATUS: DNR Diet recommendation: Regular diet.   Brief/Interim Summary: 42 very pleasant female with medical history significant for metastatic breast cancer, status post thoracic tumor removal, epicardial effusion is status post pericardial window November 2019 who presented to the emergency department with generalized weakness.  The patient has been having worsening left lateral abdominal pain.  No nausea no vomiting.  She has chronic abdominal pain due to hepatomegaly.  She has been more sleepy, she has not been able to eat due to pain when she is swallow.  Patient was noted to have hypoglycemia, hyperkalemia, and worsening increased liver function test.  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 Ultrasound was negative for any evidence of bile duct obstruction. KUB negative for obstruction. Continue with pain management. Bowel regimen. She had good Bowel movement.  Pain is controlled.   2-Hyponatremia; Continue with IV fluid. Improve  Metastatic  breast cancer, to liver Dr [name], saw patient in consultation.  He is recommending  hospice care. Patient to be discharged with hospice care  Hyperkalemia; resolved  Ascites malignant; Small amount of ascites on  ultrasound.   Hypotension setting of dehydration.  Treated with IV fluids.   Hypoglycemia continue with D5.  Dysphagia; concern with esophagitis.  Carafate.  Fluconazole to treat for Candida esophagitis.  She noticed some improvement.  Continue with IV fluids. Drinking ensure better. Swallowing better   Elevated lactic acidosis;, leukocytosis. Continue with ceftriaxone.  She is complaining of cough.  I suspect lactic acidosis is related to liver failure. Leokocytosis, suspect related to malignancy. Will complete course of antibiotics. Discharge on Augmentin.    Moderate Malnutrition; secondary Chronic illness, cancer related.  On ensure.   Discharge Diagnoses:  Active Problems:   Ascites   Abdominal pain   Metastasis to liver (HCC)   Hyponatremia   Hyperkalemia   Transaminitis   Ascites, malignant   Metastatic breast cancer (HCC)   Terminal care   Hypotension   Hypoglycemia   Elevated lactic acid level   Malnutrition of moderate degree   Dehydration   Palliative care by specialist   Liver dysfunction    Discharge Instructions  Discharge Instructions    Diet general   Complete by:  As directed    Increase activity slowly   Complete by:  As directed      Allergies as of 06/19/2018   No Known Allergies     Medication List    STOP taking these medications   metoprolol tartrate 25 MG tablet Commonly known as:  LOPRESSOR   oxyCODONE 5 MG immediate release tablet Commonly known as:  Oxy IR/ROXICODONE Replaced by:  oxyCODONE 5  MG/5ML solution   sucralfate 1 g tablet Commonly known as:  CARAFATE Replaced by:  sucralfate 1 GM/10ML suspension     TAKE these medications   ALPHA LIPOIC ACID PO Take 1 capsule by mouth daily.   amoxicillin-clavulanate 125-31.25 MG/5ML suspension Commonly known as:  AUGMENTIN Take 5 mLs  (125 mg total) by mouth 2 (two) times daily for 4 days.   bisacodyl 5 MG EC tablet Commonly known as:  DULCOLAX Take 1 tablet (5 mg total) by mouth daily as needed for moderate constipation.   docusate sodium 100 MG capsule Commonly known as:  COLACE Take 1 capsule (100 mg total) by mouth 2 (two) times daily. What changed:    when to take this  reasons to take this   fluconazole 100 MG tablet Commonly known as:  DIFLUCAN Take 1 tablet (100 mg total) by mouth daily.   lidocaine 2 % solution Commonly known as:  XYLOCAINE Use as directed 15 mLs in the mouth or throat every 4 (four) hours as needed (for esophagitis).   lidocaine-prilocaine cream Commonly known as:  EMLA Apply to affected area once   LORazepam 0.5 MG tablet Commonly known as:  ATIVAN Take 1 tablet (0.5 mg total) by mouth at bedtime as needed for sleep.   magic mouthwash Soln Take 5 mLs by mouth 3 (three) times daily as needed for mouth pain.   Magnesium 400 MG Tabs Take 400 mg by mouth 2 (two) times daily.   multivitamin tablet Take 1 tablet by mouth daily.   ondansetron 8 MG tablet Commonly known as:  ZOFRAN Take 1 tablet (8 mg total) by mouth 2 (two) times daily as needed (Nausea or vomiting).   oxyCODONE 5 MG/5ML solution Commonly known as:  ROXICODONE Take 10-15 mLs (10-15 mg total) by mouth every 3 (three) hours as needed for up to 7 days for moderate pain or severe pain (dyspnea). Replaces:  oxyCODONE 5 MG immediate release tablet   polyethylene glycol packet Commonly known as:  MIRALAX / GLYCOLAX Take 17 g by mouth 2 (two) times daily.   prochlorperazine 10 MG tablet Commonly known as:  COMPAZINE Take 1 tablet (10 mg total) by mouth every 6 (six) hours as needed (Nausea or vomiting).   senna-docusate 8.6-50 MG tablet Commonly known as:  Senokot-S Take 1 tablet by mouth 2 (two) times daily.   sucralfate 1 GM/10ML suspension Commonly known as:  CARAFATE Take 10 mLs (1 g total) by mouth 4  (four) times daily -  with meals and at bedtime. Replaces:  sucralfate 1 g tablet   TURMERIC PO Take 1 capsule by mouth daily.       No Known Allergies  Consultations:  Dr [name].   Dr Domingo Cocking.    Procedures/Studies:    Subjective: Patient is feeling ok, drinking better.  Pain is controlled.   Discharge Exam: Vitals:   06/18/18 2057 06/19/18 0439  BP: 113/78 (!) 131/94  Pulse: (!) 108 (!) 111  Resp: 18 18  Temp: 97.7 F (36.5 C) 97.6 F (36.4 C)  SpO2: 100% 100%   Vitals:   06/18/18 0500 06/18/18 1324 06/18/18 2057 06/19/18 0439  BP: 112/77 116/76 113/78 (!) 131/94  Pulse: (!) 116 (!) 108 (!) 108 (!) 111  Resp: 16 16 18 18   Temp: 98 F (36.7 C) 97.9 F (36.6 C) 97.7 F (36.5 C) 97.6 F (36.4 C)  TempSrc: Oral Oral Oral Oral  SpO2: 99% 100% 100% 100%  Weight:  Height:        General: thin appearing  Cardiovascular: RRR, S1/S2 +, no rubs, no gallops Respiratory: CTA bilaterally, no wheezing, no rhonchi Abdominal: Soft, NT, ND, bowel sounds + Extremities: no edema, no cyanosis    The results of significant diagnostics from this hospitalization (including imaging, microbiology, ancillary and laboratory) are listed below for reference.     Microbiology: Recent Results (from the past 240 hour(s))  Culture, blood (x 2)     Status: None (Preliminary result)   Collection Time: 06/16/18  1:22 AM  Result Value Ref Range Status   Specimen Description   Final    BLOOD RIGHT UPPER ARM Performed at [facility], [address], [place], [place] [address]    Special Requests   Final    BOTTLES DRAWN AEROBIC AND ANAEROBIC Blood Culture adequate volume Performed at [facility] [address] [place], [place] [address]    Culture   Final    NO GROWTH 2 DAYS Performed at [facility] [address] [place], [place] [address]    Report Status PENDING  Incomplete  MRSA PCR Screening     Status: None   Collection  Time: 06/16/18  8:36 AM  Result Value Ref Range Status   MRSA by PCR NEGATIVE NEGATIVE Final    Comment:        The GeneXpert MRSA Assay (FDA approved for NASAL specimens only), is one component of a comprehensive MRSA colonization surveillance program. It is not intended to diagnose MRSA infection nor to guide or monitor treatment for MRSA infections. Performed at [facility], [place] [address], [place], [place] [address]   Culture, blood (x 2)     Status: None (Preliminary result)   Collection Time: 06/16/18  8:54 AM  Result Value Ref Range Status   Specimen Description   Final    BLOOD RIGHT HAND Performed at [facility] [address], [place], [place] [address]    Special Requests   Final    BOTTLES DRAWN AEROBIC ONLY Blood Culture results may not be optimal due to an inadequate volume of blood received in culture bottles Performed at [facility] [address] [place], [place] [address]    Culture   Final    NO GROWTH 2 DAYS Performed at [facility] [address] [place], [place] [address]    Report Status PENDING  Incomplete     Labs: BNP (last 3 results) No results for input(s): BNP in the last 8760 hours. Basic Metabolic Panel: Recent Labs  Lab 06/15/18 1707 06/15/18 2248 06/16/18 0502 06/17/18 0311 06/18/18 0519  NA 129* 131* 132* 129* 132*  K 5.6* 5.0 4.9 4.9 4.3  CL 86* 92* 92* 93* 94*  CO2 26 23 24 22  21*  GLUCOSE 68* 87 84 77 74  BUN 32* 26* 22* 16 17  CREATININE 0.44 0.48 0.43* 0.34* 0.39*  CALCIUM 9.6 8.4* 8.6* 8.7* 9.0  MG  --  2.8* 2.7*  --   --   PHOS  --  3.4 2.9  --   --    Liver Function Tests: Recent Labs  Lab 06/15/18 1707 06/16/18 0502  AST 674* 585*  ALT 191* 162*  ALKPHOS 768* 684*  BILITOT 6.6* 6.3*  PROT 8.2* 7.2  ALBUMIN 2.6* 2.1*   Recent Labs  Lab 06/15/18 1707  LIPASE 35   Recent Labs  Lab 06/15/18 2248  AMMONIA  25   CBC: Recent Labs   Lab 06/15/18 1707 06/16/18 0502 06/17/18 0311 06/18/18 0519 06/19/18 0537  WBC 16.0* 15.1* 19.8* 22.1* 22.5*  NEUTROABS 13.6*  --   --   --   --   HGB 10.6* 9.4* 9.4* 9.3* 9.9*  HCT 34.5* 30.8* 31.1* 30.7* 33.2*  MCV 81.8 82.8 84.3 83.2 84.9  PLT 231 181 125* 119* 134*   Cardiac Enzymes: No results for input(s): CKTOTAL, CKMB, CKMBINDEX, TROPONINI in the last 168 hours. BNP: Invalid input(s): POCBNP CBG: Recent Labs  Lab 06/17/18 1003 06/17/18 1152 06/17/18 1236 06/17/18 1415 06/17/18 1604  GLUCAP 85 61* 121* 78 80   D-Dimer No results for input(s): DDIMER in the last 72 hours. Hgb A1c No results for input(s): HGBA1C in the last 72 hours. Lipid Profile No results for input(s): CHOL, HDL, LDLCALC, TRIG, CHOLHDL, LDLDIRECT in the last 72 hours. Thyroid function studies No results for input(s): TSH, T4TOTAL, T3FREE, THYROIDAB in the last 72 hours.  Invalid input(s): FREET3 Anemia work up No results for input(s): VITAMINB12, FOLATE, FERRITIN, TIBC, IRON, RETICCTPCT in the last 72 hours. Urinalysis    Component Value Date/Time   COLORURINE AMBER (A) 06/15/2018 2248   APPEARANCEUR CLEAR 06/15/2018 2248   LABSPEC 1.018 06/15/2018 2248   PHURINE 6.0 06/15/2018 2248   GLUCOSEU 50 (A) 06/15/2018 2248   HGBUR MODERATE (A) 06/15/2018 2248   BILIRUBINUR NEGATIVE 06/15/2018 2248   KETONESUR NEGATIVE 06/15/2018 2248   PROTEINUR NEGATIVE 06/15/2018 2248   UROBILINOGEN 0.2 01/26/2013 0704   NITRITE NEGATIVE 06/15/2018 2248   LEUKOCYTESUR NEGATIVE 06/15/2018 2248   Sepsis Labs Invalid input(s): PROCALCITONIN,  WBC,  LACTICIDVEN Microbiology Recent Results (from the past 240 hour(s))  Culture, blood (x 2)     Status: None (Preliminary result)   Collection Time: 06/16/18  1:22 AM  Result Value Ref Range Status   Specimen Description   Final    BLOOD RIGHT UPPER ARM Performed at [facility], [place] [address] [place], [place] [address]    Special Requests   Final     BOTTLES DRAWN AEROBIC AND ANAEROBIC Blood Culture adequate volume Performed at [facility] [address], [place], [place] [address]    Culture   Final    NO GROWTH 2 DAYS Performed at [facility] [address] [place], [place] [address]    Report Status PENDING  Incomplete  MRSA PCR Screening     Status: None   Collection Time: 06/16/18  8:36 AM  Result Value Ref Range Status   MRSA by PCR NEGATIVE NEGATIVE Final    Comment:        The GeneXpert MRSA Assay (FDA approved for NASAL specimens only), is one component of a comprehensive MRSA colonization surveillance program. It is not intended to diagnose MRSA infection nor to guide or monitor treatment for MRSA infections. Performed at [facility], [place] [address] [place], [place] [address]   Culture, blood (x 2)     Status: None (Preliminary result)   Collection Time: 06/16/18  8:54 AM  Result Value Ref Range Status   Specimen Description   Final    BLOOD RIGHT HAND Performed at [facility] [address] [place], [place] [address]    Special Requests   Final    BOTTLES DRAWN AEROBIC ONLY Blood Culture results may not be optimal due to an inadequate volume of blood received in culture bottles Performed at [facility]  [facility], [place] [address] [place], [place] [address]    Culture   Final    NO GROWTH 2 DAYS Performed at [facility] [address], [place], [place] [address]    Report Status PENDING  Incomplete     Time coordinating discharge: 35 minutes.   SIGNED:   Elmarie Shiley, MD  [facility] 06/19/2018, 2:28 PM Pager   If 7PM-7AM, please contact night-coverage [email or web address] Password [email or web address]

## 2018-06-19 NOTE — Progress Notes (Addendum)
[facility])  Received request 06/17/18 from [facility] for family interest in [facility] services at home after discharge. Eligibility confirmed by [facility] physician 06/17/18. DME has been ordered through [facility] 06/18/18 and is scheduled to be delivered to home later today 06/19/18 between 11:30 and 3:00 pm. DME ordered includes hospital bed with half rails, over bed table, walker, shower chair and bedside commode. Spouse [name] is coordinating with [facility] to get DME in home. Per previous discussion, patient to return home by car.   Please send home with patient signed and completed out of facility DNR and scripts for any medication patient does not already have including comfort medications.   [facility] is aware of this patient and will schedule RN visit at home after discharge. Please contact hospital liaison (listed on AMION) or [facility] at [phone] when patient leaves the hospital.  Please call with hospice related questions.   Thank you,  Erling Conte, LCSW [phone]

## 2018-06-21 ENCOUNTER — Inpatient Hospital Stay: Payer: Medicaid Other

## 2018-06-21 LAB — CULTURE, BLOOD (ROUTINE X 2)
CULTURE: NO GROWTH
Culture: NO GROWTH
Special Requests: ADEQUATE

## 2018-06-24 ENCOUNTER — Other Ambulatory Visit: Payer: Medicaid Other

## 2018-06-28 ENCOUNTER — Other Ambulatory Visit: Payer: Medicaid Other

## 2018-06-28 ENCOUNTER — Ambulatory Visit: Payer: Medicaid Other | Admitting: Adult Health

## 2018-06-28 ENCOUNTER — Ambulatory Visit: Payer: Medicaid Other

## 2018-07-07 ENCOUNTER — Encounter: Payer: Self-pay | Admitting: Hematology and Oncology

## 2018-07-07 ENCOUNTER — Telehealth: Payer: Self-pay | Admitting: *Deleted

## 2018-07-07 NOTE — Telephone Encounter (Signed)
error 

## 2018-07-12 ENCOUNTER — Other Ambulatory Visit: Payer: Medicaid Other

## 2018-07-12 ENCOUNTER — Ambulatory Visit: Payer: Medicaid Other

## 2018-07-13 DEATH — deceased

## 2018-07-19 ENCOUNTER — Other Ambulatory Visit: Payer: Medicaid Other

## 2018-07-19 ENCOUNTER — Ambulatory Visit: Payer: Medicaid Other

## 2018-07-19 ENCOUNTER — Ambulatory Visit: Payer: Medicaid Other | Admitting: Hematology and Oncology

## 2018-08-01 ENCOUNTER — Ambulatory Visit: Payer: Self-pay | Admitting: Radiation Oncology

## 2018-08-02 ENCOUNTER — Ambulatory Visit: Payer: Medicaid Other | Admitting: Hematology and Oncology

## 2018-09-29 ENCOUNTER — Encounter (HOSPITAL_COMMUNITY): Payer: Self-pay | Admitting: Hematology and Oncology

## 2018-12-19 IMAGING — CR DG CHEST 2V
2 series · 2 of 2 positions shown · non-contrast
Comparison: Multiple chest x-rays since May 23, 2018

CLINICAL DATA: Weakness.

EXAM:
CHEST - 2 VIEW

[w chest lat]
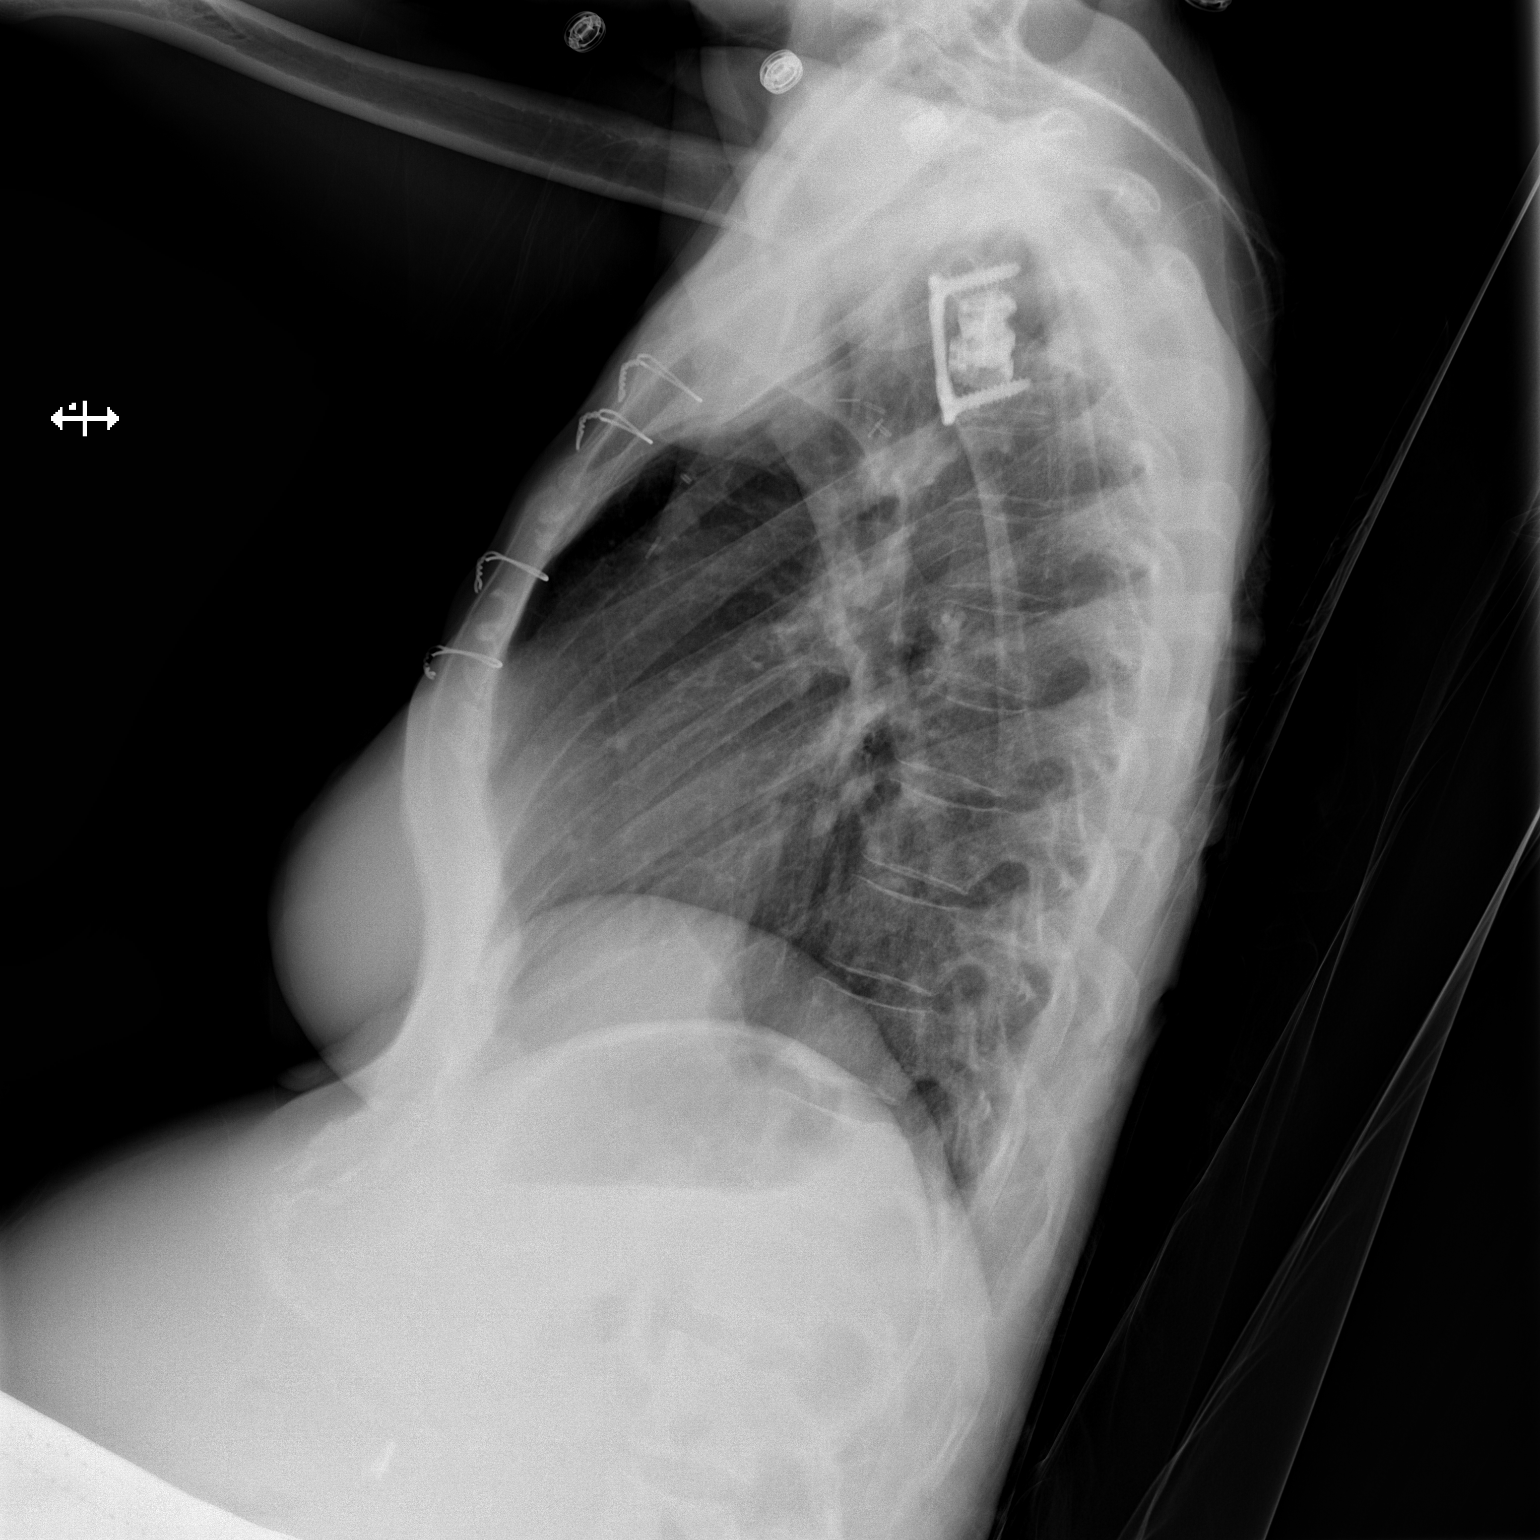

[x chest ap]
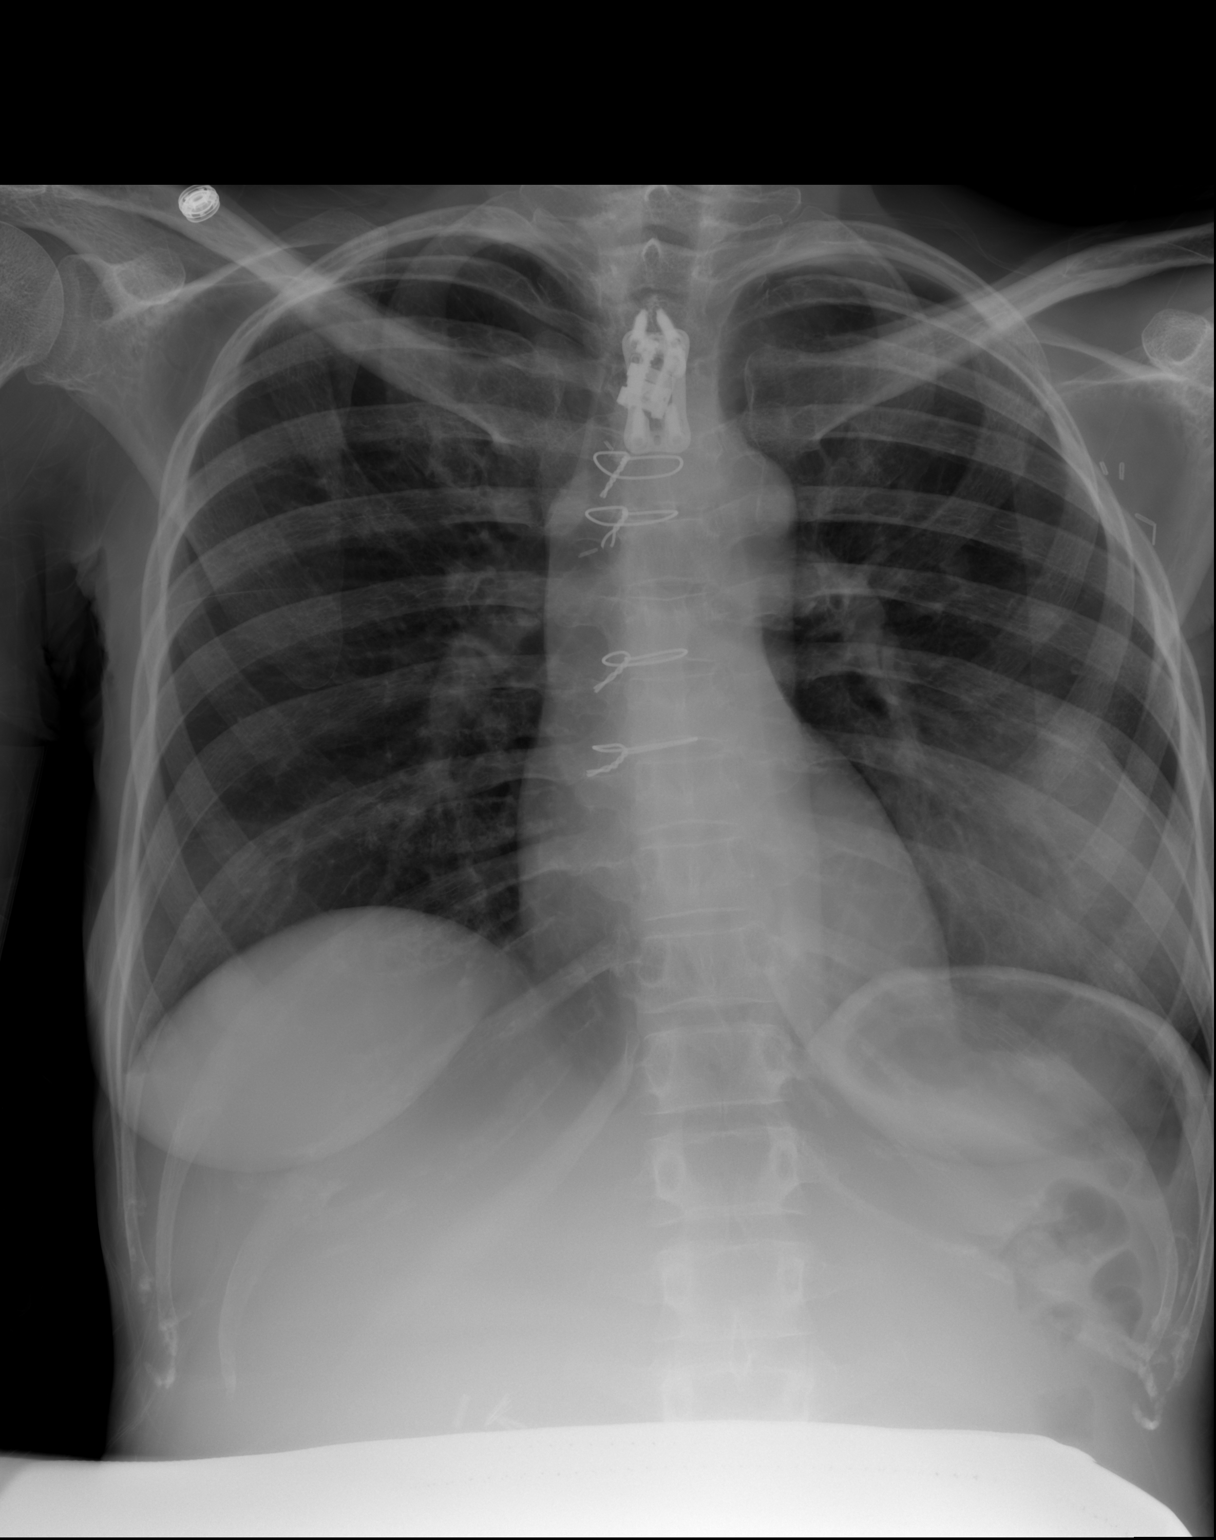

[2 of 2 positions shown; findings below may reference images not displayed]

FINDINGS: Haziness over the left base is consistent with overlapping soft
tissues. The heart, hila, mediastinum, lungs, and pleura are
otherwise unchanged with no acute interval change.
IMPRESSION: No acute interval change.

## 2018-12-19 IMAGING — DX DG ABDOMEN 1V
1 series · 1 of 1 positions shown · non-contrast
Comparison: 05/13/2018

CLINICAL DATA: Obstipation

EXAM:
ABDOMEN - 1 VIEW

[abdomen kub]
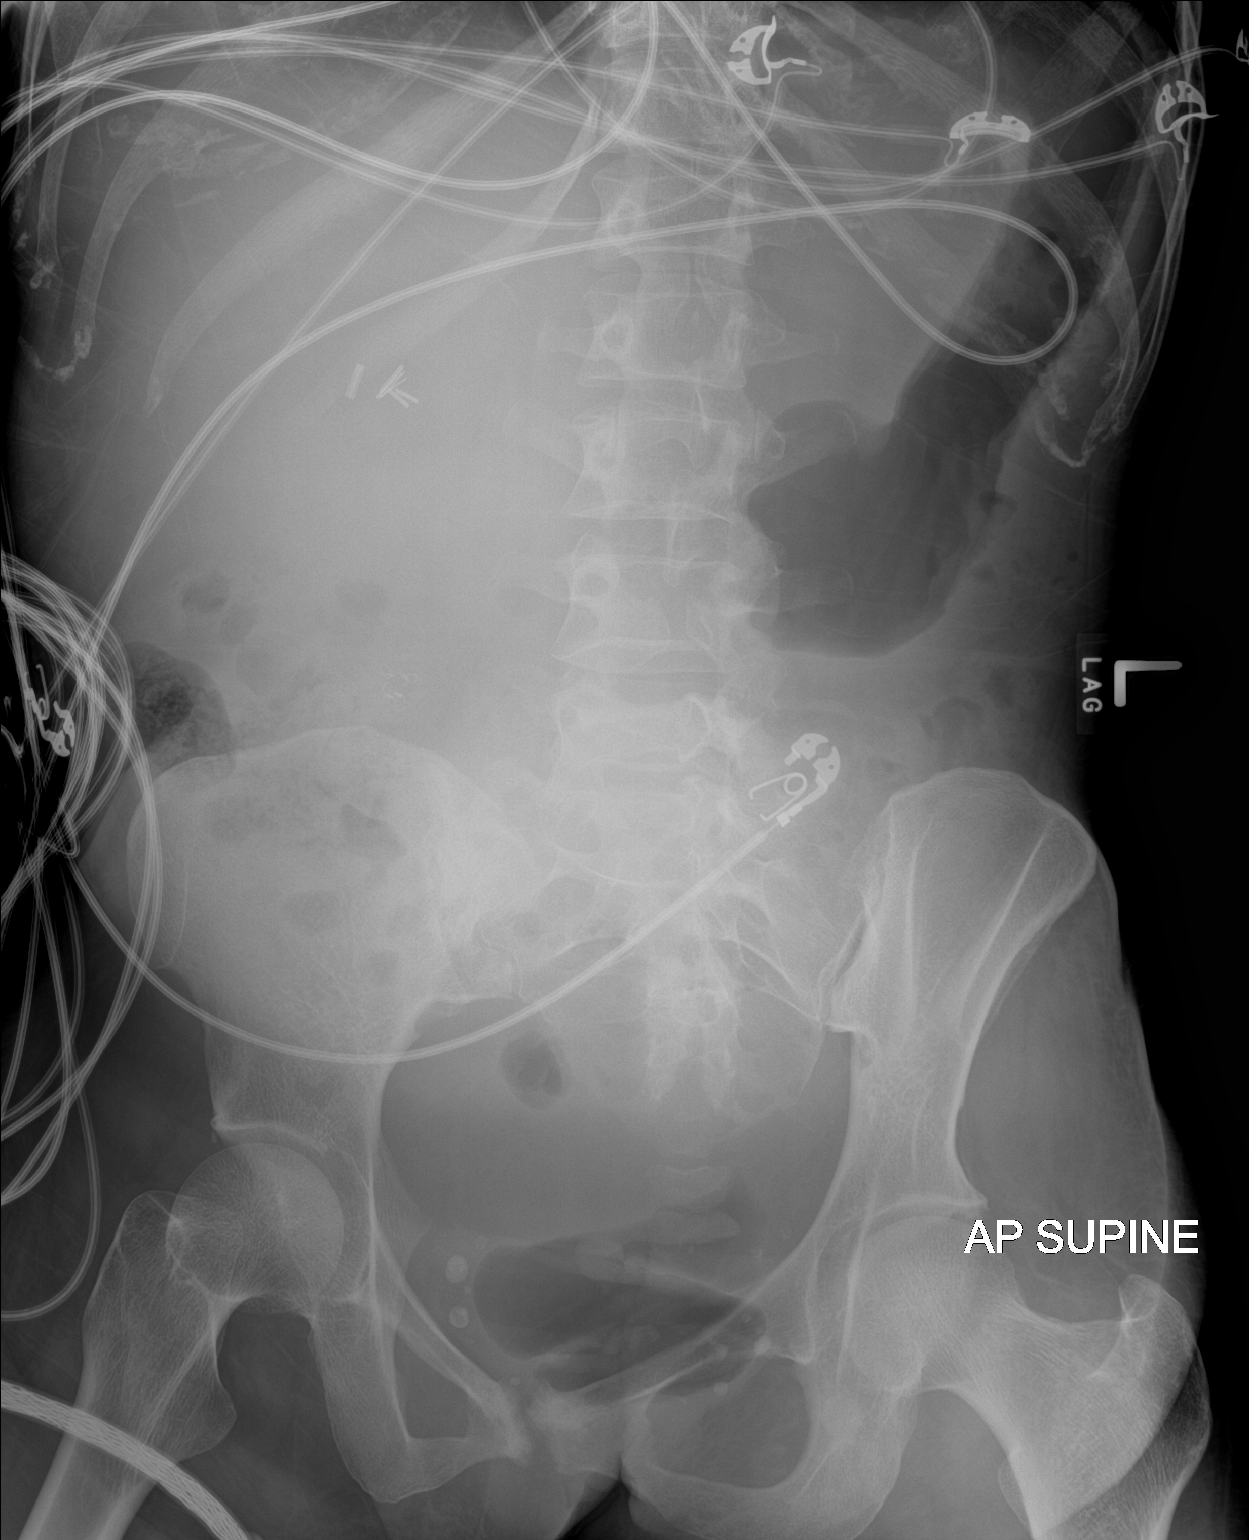

[1 of 1 positions shown; findings below may reference images not displayed]

FINDINGS: Prior cholecystectomy. Nonobstructive bowel gas pattern. No free
air. Probable enlarged liver, likely related to known metastatic
liver disease. Possible ascites in the abdomen with generalized loss
of organ margins. No acute bony abnormality.
IMPRESSION: Prior cholecystectomy.  No evidence of bowel obstruction.

Probable hepatomegaly.  Suspect ascites.
